# Patient Record
Sex: Male | Born: 1940
Health system: Southern US, Community
[De-identification: ages and names within clinical notes are randomized; demographics above are authoritative.]

## PROBLEM LIST (undated history)

## (undated) DIAGNOSIS — I471 Supraventricular tachycardia, unspecified: Secondary | ICD-10-CM

## (undated) DIAGNOSIS — E119 Type 2 diabetes mellitus without complications: Secondary | ICD-10-CM

## (undated) DIAGNOSIS — J449 Chronic obstructive pulmonary disease, unspecified: Secondary | ICD-10-CM

## (undated) DIAGNOSIS — C679 Malignant neoplasm of bladder, unspecified: Secondary | ICD-10-CM

## (undated) DIAGNOSIS — N289 Disorder of kidney and ureter, unspecified: Secondary | ICD-10-CM

## (undated) DIAGNOSIS — I5030 Unspecified diastolic (congestive) heart failure: Secondary | ICD-10-CM

## (undated) DIAGNOSIS — I4729 Other ventricular tachycardia: Secondary | ICD-10-CM

## (undated) DIAGNOSIS — I509 Heart failure, unspecified: Secondary | ICD-10-CM

## (undated) DIAGNOSIS — E785 Hyperlipidemia, unspecified: Secondary | ICD-10-CM

## (undated) DIAGNOSIS — I10 Essential (primary) hypertension: Secondary | ICD-10-CM

## (undated) DIAGNOSIS — N184 Chronic kidney disease, stage 4 (severe): Secondary | ICD-10-CM

## (undated) DIAGNOSIS — I472 Ventricular tachycardia: Secondary | ICD-10-CM

## (undated) DIAGNOSIS — I2510 Atherosclerotic heart disease of native coronary artery without angina pectoris: Secondary | ICD-10-CM

## (undated) HISTORY — PX: REVISION UROSTOMY CUTANEOUS: SUR1282

## (undated) HISTORY — DX: Ventricular tachycardia: I47.2

## (undated) HISTORY — DX: Supraventricular tachycardia: I47.1

## (undated) HISTORY — DX: Other ventricular tachycardia: I47.29

## (undated) HISTORY — DX: Supraventricular tachycardia, unspecified: I47.10

## (undated) HISTORY — DX: Chronic kidney disease, stage 4 (severe): N18.4

## (undated) HISTORY — DX: Unspecified diastolic (congestive) heart failure: I50.30

## (undated) HISTORY — DX: Atherosclerotic heart disease of native coronary artery without angina pectoris: I25.10

## (undated) HISTORY — PX: COLONOSCOPY: SHX174

---

## 2007-07-22 ENCOUNTER — Ambulatory Visit: Payer: Self-pay | Admitting: Family Medicine

## 2007-09-09 ENCOUNTER — Ambulatory Visit: Payer: Self-pay | Admitting: Urology

## 2007-09-09 ENCOUNTER — Other Ambulatory Visit: Payer: Self-pay

## 2007-09-15 ENCOUNTER — Ambulatory Visit: Payer: Self-pay | Admitting: Urology

## 2007-10-05 ENCOUNTER — Ambulatory Visit: Payer: Self-pay | Admitting: Internal Medicine

## 2007-10-17 ENCOUNTER — Ambulatory Visit: Payer: Self-pay | Admitting: Internal Medicine

## 2007-11-05 ENCOUNTER — Ambulatory Visit: Payer: Self-pay | Admitting: Internal Medicine

## 2007-12-05 ENCOUNTER — Ambulatory Visit: Payer: Self-pay | Admitting: Internal Medicine

## 2008-01-05 ENCOUNTER — Ambulatory Visit: Payer: Self-pay | Admitting: Internal Medicine

## 2008-02-04 ENCOUNTER — Ambulatory Visit: Payer: Self-pay | Admitting: Internal Medicine

## 2008-02-12 ENCOUNTER — Emergency Department: Payer: Self-pay | Admitting: Emergency Medicine

## 2008-03-06 ENCOUNTER — Ambulatory Visit: Payer: Self-pay | Admitting: Internal Medicine

## 2008-04-06 ENCOUNTER — Ambulatory Visit: Payer: Self-pay | Admitting: Internal Medicine

## 2008-04-26 ENCOUNTER — Ambulatory Visit: Payer: Self-pay | Admitting: Internal Medicine

## 2008-04-28 ENCOUNTER — Ambulatory Visit: Payer: Self-pay | Admitting: Internal Medicine

## 2008-05-06 ENCOUNTER — Ambulatory Visit: Payer: Self-pay | Admitting: Internal Medicine

## 2008-07-06 ENCOUNTER — Ambulatory Visit: Payer: Self-pay | Admitting: Internal Medicine

## 2008-07-21 ENCOUNTER — Ambulatory Visit: Payer: Self-pay | Admitting: Internal Medicine

## 2008-08-06 ENCOUNTER — Ambulatory Visit: Payer: Self-pay | Admitting: Internal Medicine

## 2008-09-06 ENCOUNTER — Ambulatory Visit: Payer: Self-pay | Admitting: Internal Medicine

## 2008-11-04 ENCOUNTER — Ambulatory Visit: Payer: Self-pay | Admitting: Internal Medicine

## 2008-11-08 ENCOUNTER — Ambulatory Visit: Payer: Self-pay | Admitting: Internal Medicine

## 2008-12-04 ENCOUNTER — Ambulatory Visit: Payer: Self-pay | Admitting: Internal Medicine

## 2009-04-06 ENCOUNTER — Ambulatory Visit: Payer: Self-pay | Admitting: Internal Medicine

## 2009-04-18 ENCOUNTER — Ambulatory Visit: Payer: Self-pay | Admitting: Internal Medicine

## 2009-04-27 ENCOUNTER — Ambulatory Visit: Payer: Self-pay | Admitting: Internal Medicine

## 2009-04-27 ENCOUNTER — Ambulatory Visit: Payer: Self-pay | Admitting: Ophthalmology

## 2009-05-06 ENCOUNTER — Ambulatory Visit: Payer: Self-pay | Admitting: Internal Medicine

## 2009-05-10 ENCOUNTER — Ambulatory Visit: Payer: Self-pay | Admitting: Ophthalmology

## 2009-07-26 ENCOUNTER — Ambulatory Visit: Payer: Self-pay | Admitting: Ophthalmology

## 2010-05-15 ENCOUNTER — Ambulatory Visit: Payer: Self-pay | Admitting: Family Medicine

## 2012-03-25 ENCOUNTER — Ambulatory Visit: Payer: Self-pay | Admitting: Urology

## 2012-04-09 ENCOUNTER — Ambulatory Visit: Payer: Self-pay | Admitting: Urology

## 2012-04-09 LAB — HEMOGLOBIN: HGB: 14.4 g/dL (ref 13.0–18.0)

## 2012-04-14 ENCOUNTER — Ambulatory Visit: Payer: Self-pay | Admitting: Urology

## 2012-04-21 ENCOUNTER — Ambulatory Visit: Payer: Self-pay | Admitting: Internal Medicine

## 2012-04-21 LAB — COMPREHENSIVE METABOLIC PANEL
Albumin: 3.4 g/dL (ref 3.4–5.0)
Anion Gap: 6 — ABNORMAL LOW (ref 7–16)
BUN: 15 mg/dL (ref 7–18)
Calcium, Total: 9.3 mg/dL (ref 8.5–10.1)
Chloride: 105 mmol/L (ref 98–107)
Co2: 28 mmol/L (ref 21–32)
EGFR (African American): >60
EGFR (Non-African Amer.): 57 — ABNORMAL LOW
Potassium: 4.8 mmol/L (ref 3.5–5.1)
SGOT(AST): 14 U/L — ABNORMAL LOW (ref 15–37)
Sodium: 139 mmol/L (ref 136–145)

## 2012-04-21 LAB — CBC CANCER CENTER
Basophil %: 1.3 %
Eosinophil #: 0.1 x10 3/mm (ref 0.0–0.7)
Eosinophil %: 1.4 %
HCT: 45.4 % (ref 40.0–52.0)
MCH: 28.7 pg (ref 26.0–34.0)
MCHC: 32.7 g/dL (ref 32.0–36.0)
Monocyte #: 0.7 x10 3/mm (ref 0.2–1.0)
Neutrophil #: 7.2 x10 3/mm — ABNORMAL HIGH (ref 1.4–6.5)
Neutrophil %: 78.2 %

## 2012-04-21 LAB — PROTIME-INR
INR: 1
Prothrombin Time: 13.1 secs (ref 11.5–14.7)

## 2012-04-21 LAB — APTT: Activated PTT: 27.2 secs (ref 23.6–35.9)

## 2012-04-28 ENCOUNTER — Ambulatory Visit: Payer: Self-pay | Admitting: Internal Medicine

## 2012-05-06 ENCOUNTER — Ambulatory Visit: Payer: Self-pay | Admitting: Internal Medicine

## 2013-01-12 DIAGNOSIS — K439 Ventral hernia without obstruction or gangrene: Secondary | ICD-10-CM | POA: Insufficient documentation

## 2013-01-12 DIAGNOSIS — R197 Diarrhea, unspecified: Secondary | ICD-10-CM | POA: Insufficient documentation

## 2014-11-23 NOTE — Op Note (Signed)
PATIENT NAME:  Bryan Brewer, Bryan Brewer MR#:  [id number] DATE OF BIRTH:  1941-06-13  DATE OF PROCEDURE:  04/14/2012  PREOPERATIVE DIAGNOSIS: Bladder cancer.   POSTOPERATIVE DIAGNOSES:  1. Bladder cancer.  2. Radiation cystitis.  PROCEDURE: Cystoscopy with bladder biopsy.   SURGEON: Otelia Limes. Yves Dill, MD  ANESTHETIST: [employer] and Yves Dill.   ANESTHETIC METHOD: General per [employer] [facility] and local per Yves Dill.   INDICATIONS: See the dictated history and physical. After informed consent patient requests above procedure.   OPERATIVE SUMMARY: After adequate general anesthesia had been obtained, patient was placed into dorsal lithotomy position and the perineum was prepped and draped in usual fashion. The 21 French cystoscope was coupled with the camera and then visually advanced into the bladder. Patient had a mid pendulous urethral stricture approximately 20 French in caliber and the scope was easily passed through this. Bladder was heavily trabeculated with cellules and small diverticula present. There were some erythematous patches near the bladder neck consistent with radiation cystitis. There was also some old calcifications present on the bladder neck. No obvious tumors were identified. Both ureteral orifices were identified and had clear efflux. At this point, the cold cup biopsy forceps were advanced through the scope and the erythematous areas on the bladder neck were biopsied. Biopsy sites were fulgurated with the Bugbee electrode. At this point bladder was drained and scope was removed. 10 mL of viscous Xylocaine was instilled within the urethra. 20 French silicone catheter was placed. Catheter was irrigated until clear.     Procedure was then terminated and the patient was transferred to the recovery room in stable condition.   ____________________________ Otelia Limes. Yves Dill, MD mrw:cms D: 04/14/2012 10:19:31 ET T: 04/14/2012 11:56:14 ET JOB#: [id number]  cc: Otelia Limes. Yves Dill, MD,  <Dictator> Armstead Peaks, MD  Royston Cowper MD ELECTRONICALLY SIGNED 04/15/2012 8:21

## 2014-11-23 NOTE — H&P (Signed)
PATIENT NAME:  Bryan Brewer, Bryan Brewer MR#:  [id number] DATE OF BIRTH:  03-Dec-1940  DATE OF ADMISSION:  04/14/2012  CHIEF COMPLAINT: Bladder cancer.   HISTORY OF PRESENT ILLNESS: Mr. Turnquist is a 74 year old white male with a history of muscle invasive bladder cancer treated with radiation and chemotherapy in 2009. He had follow-up cystoscopy with urine cytology August 28 which revealed suspicious cancer cells present. He also had a CT scan which revealed thickening of his bladder. He comes in now for cystoscopy with bladder biopsy.   ALLERGIES: No drug allergies.   CURRENT MEDICATIONS:  1. Atenolol. 2. Zocor. 3. Aspirin.  PAST SURGICAL HISTORY:  1. Right inguinal herniorrhaphy in 1988.  2. Appendectomy in 1954.   SOCIAL HISTORY: Smokes a pack a day, has a 30 pack-year history. Denied alcohol use.   FAMILY HISTORY: Remarkable for diabetes and heart disease.   PAST AND CURRENT MEDICAL CONDITIONS:  1. Hypertension.  2. Hypercholesterolemia.  3. Chronic obstructive pulmonary disease.   REVIEW OF SYSTEMS: Patient denied chest pain, diabetes, or stroke.   PHYSICAL EXAMINATION:  GENERAL: Well-nourished white male in no acute distress.   HEENT: Sclerae were clear. Pupils are equally round, reactive to light and accommodation. Extraocular movements are intact.   NECK: Supple. No palpable cervical adenopathy.   LUNGS: Clear to auscultation.   CARDIOVASCULAR: Regular rhythm and rate without audible murmurs or gallops.   ABDOMEN: Soft, nontender abdomen.   GENITOURINARY: Uncircumcised. Testes smooth, nontender.   RECTAL: 30 grams smooth nontender prostate.   NEUROMUSCULAR: Nonfocal. Patient alert and oriented x3.   IMPRESSION: Bladder cancer.   PLAN: Cystoscopy with bladder biopsy.   ____________________________ Otelia Limes. Yves Dill, MD mrw:cms D: 04/09/2012 11:10:17 ET T: 04/09/2012 11:26:53 ET JOB#: [id number]  cc: Otelia Limes. Yves Dill, MD, <Dictator> Royston Cowper MD ELECTRONICALLY  SIGNED 04/09/2012 13:12

## 2015-03-30 DIAGNOSIS — I10 Essential (primary) hypertension: Secondary | ICD-10-CM | POA: Insufficient documentation

## 2015-09-08 DIAGNOSIS — J410 Simple chronic bronchitis: Secondary | ICD-10-CM | POA: Insufficient documentation

## 2015-09-08 DIAGNOSIS — E7800 Pure hypercholesterolemia, unspecified: Secondary | ICD-10-CM | POA: Insufficient documentation

## 2016-04-30 DIAGNOSIS — Z8551 Personal history of malignant neoplasm of bladder: Secondary | ICD-10-CM | POA: Insufficient documentation

## 2016-08-08 ENCOUNTER — Other Ambulatory Visit: Payer: Self-pay | Admitting: Internal Medicine

## 2016-08-08 DIAGNOSIS — Z9189 Other specified personal risk factors, not elsewhere classified: Secondary | ICD-10-CM

## 2016-08-08 DIAGNOSIS — I2699 Other pulmonary embolism without acute cor pulmonale: Secondary | ICD-10-CM

## 2016-08-09 ENCOUNTER — Ambulatory Visit
Admission: RE | Admit: 2016-08-09 | Discharge: 2016-08-09 | Disposition: A | Discharge status: Home / Self Care | Payer: BLUE CROSS/BLUE SHIELD | Source: Ambulatory Visit | Attending: Internal Medicine | Admitting: Internal Medicine

## 2016-08-09 ENCOUNTER — Encounter
Admission: RE | Admit: 2016-08-09 | Discharge: 2016-08-09 | Disposition: A | Discharge status: Home / Self Care | Payer: BLUE CROSS/BLUE SHIELD | Source: Ambulatory Visit | Attending: Internal Medicine | Admitting: Internal Medicine

## 2016-08-09 DIAGNOSIS — Z9189 Other specified personal risk factors, not elsewhere classified: Secondary | ICD-10-CM

## 2016-08-09 DIAGNOSIS — R918 Other nonspecific abnormal finding of lung field: Secondary | ICD-10-CM | POA: Insufficient documentation

## 2016-08-09 DIAGNOSIS — I700 Atherosclerosis of aorta: Secondary | ICD-10-CM | POA: Diagnosis not present

## 2016-08-09 MED ORDER — TECHNETIUM TO 99M ALBUMIN AGGREGATED
4.0000 | Freq: Once | INTRAVENOUS | Status: AC | PRN
  Administered 2016-08-09: 4.42 via INTRAVENOUS

## 2016-08-09 MED ORDER — TECHNETIUM TC 99M DIETHYLENETRIAME-PENTAACETIC ACID
30.0000 | Freq: Once | INTRAVENOUS | Status: AC | PRN
  Administered 2016-08-09: 33.55 via INTRAVENOUS

## 2016-08-15 ENCOUNTER — Ambulatory Visit: Payer: Self-pay

## 2016-08-17 ENCOUNTER — Other Ambulatory Visit: Payer: Self-pay | Admitting: Orthopedic Surgery

## 2016-08-17 DIAGNOSIS — M4316 Spondylolisthesis, lumbar region: Secondary | ICD-10-CM

## 2016-08-17 DIAGNOSIS — M5136 Other intervertebral disc degeneration, lumbar region: Secondary | ICD-10-CM

## 2016-08-17 DIAGNOSIS — S22080A Wedge compression fracture of T11-T12 vertebra, initial encounter for closed fracture: Secondary | ICD-10-CM

## 2016-08-17 DIAGNOSIS — M4727 Other spondylosis with radiculopathy, lumbosacral region: Secondary | ICD-10-CM

## 2016-08-25 ENCOUNTER — Ambulatory Visit
Admission: RE | Admit: 2016-08-25 | Discharge: 2016-08-25 | Disposition: A | Discharge status: Home / Self Care | Payer: BLUE CROSS/BLUE SHIELD | Source: Ambulatory Visit | Attending: Orthopedic Surgery | Admitting: Orthopedic Surgery

## 2016-08-25 DIAGNOSIS — M5136 Other intervertebral disc degeneration, lumbar region: Secondary | ICD-10-CM | POA: Diagnosis present

## 2016-08-25 DIAGNOSIS — S22080A Wedge compression fracture of T11-T12 vertebra, initial encounter for closed fracture: Secondary | ICD-10-CM | POA: Diagnosis present

## 2016-08-25 DIAGNOSIS — M4316 Spondylolisthesis, lumbar region: Secondary | ICD-10-CM | POA: Insufficient documentation

## 2016-08-25 DIAGNOSIS — M4712 Other spondylosis with myelopathy, cervical region: Secondary | ICD-10-CM | POA: Diagnosis not present

## 2016-08-25 DIAGNOSIS — M4854XA Collapsed vertebra, not elsewhere classified, thoracic region, initial encounter for fracture: Secondary | ICD-10-CM | POA: Diagnosis not present

## 2016-08-25 DIAGNOSIS — M48061 Spinal stenosis, lumbar region without neurogenic claudication: Secondary | ICD-10-CM | POA: Insufficient documentation

## 2016-08-25 DIAGNOSIS — M4727 Other spondylosis with radiculopathy, lumbosacral region: Secondary | ICD-10-CM

## 2017-08-06 HISTORY — PX: CORONARY ARTERY BYPASS GRAFT: SHX141

## 2017-08-09 DIAGNOSIS — M5416 Radiculopathy, lumbar region: Secondary | ICD-10-CM | POA: Insufficient documentation

## 2017-08-12 ENCOUNTER — Other Ambulatory Visit: Payer: Self-pay

## 2017-08-12 ENCOUNTER — Emergency Department: Payer: BLUE CROSS/BLUE SHIELD

## 2017-08-12 ENCOUNTER — Encounter: Payer: Self-pay | Admitting: Emergency Medicine

## 2017-08-12 ENCOUNTER — Emergency Department
Admission: EM | Admit: 2017-08-12 | Discharge: 2017-08-12 | Disposition: A | Discharge status: Home / Self Care | Payer: BLUE CROSS/BLUE SHIELD | Attending: Emergency Medicine | Admitting: Emergency Medicine

## 2017-08-12 DIAGNOSIS — J449 Chronic obstructive pulmonary disease, unspecified: Secondary | ICD-10-CM | POA: Insufficient documentation

## 2017-08-12 DIAGNOSIS — Z87891 Personal history of nicotine dependence: Secondary | ICD-10-CM | POA: Diagnosis not present

## 2017-08-12 DIAGNOSIS — R0789 Other chest pain: Secondary | ICD-10-CM | POA: Insufficient documentation

## 2017-08-12 DIAGNOSIS — R079 Chest pain, unspecified: Secondary | ICD-10-CM | POA: Diagnosis present

## 2017-08-12 HISTORY — DX: Malignant neoplasm of bladder, unspecified: C67.9

## 2017-08-12 HISTORY — DX: Chronic obstructive pulmonary disease, unspecified: J44.9

## 2017-08-12 LAB — COMPREHENSIVE METABOLIC PANEL
ALK PHOS: 62 U/L (ref 38–126)
ALT: 8 U/L — AB (ref 17–63)
ANION GAP: 9 (ref 5–15)
AST: 16 U/L (ref 15–41)
Albumin: 3.6 g/dL (ref 3.5–5.0)
BUN: 62 mg/dL — ABNORMAL HIGH (ref 6–20)
CALCIUM: 8.8 mg/dL — AB (ref 8.9–10.3)
CO2: 20 mmol/L — AB (ref 22–32)
CREATININE: 3.42 mg/dL — AB (ref 0.61–1.24)
Chloride: 105 mmol/L (ref 101–111)
GFR, EST AFRICAN AMERICAN: 19 mL/min — AB (ref >60)
GFR, EST NON AFRICAN AMERICAN: 16 mL/min — AB (ref >60)
Glucose, Bld: 146 mg/dL — ABNORMAL HIGH (ref 65–99)
Potassium: 3.8 mmol/L (ref 3.5–5.1)
SODIUM: 134 mmol/L — AB (ref 135–145)
TOTAL PROTEIN: 7.6 g/dL (ref 6.5–8.1)
Total Bilirubin: 0.4 mg/dL (ref 0.3–1.2)

## 2017-08-12 LAB — LIPASE, BLOOD: Lipase: 22 U/L (ref 11–51)

## 2017-08-12 LAB — CBC
HEMATOCRIT: 29.1 % — AB (ref 40.0–52.0)
HEMOGLOBIN: 9 g/dL — AB (ref 13.0–18.0)
MCH: 22.8 pg — AB (ref 26.0–34.0)
MCHC: 31 g/dL — AB (ref 32.0–36.0)
MCV: 73.4 fL — ABNORMAL LOW (ref 80.0–100.0)
Platelets: 413 10*3/uL (ref 150–440)
RBC: 3.96 MIL/uL — ABNORMAL LOW (ref 4.40–5.90)
RDW: 17.3 % — AB (ref 11.5–14.5)
WBC: 13.2 10*3/uL — ABNORMAL HIGH (ref 3.8–10.6)

## 2017-08-12 LAB — TROPONIN I
Troponin I: 0.03 ng/mL (ref <0.03)
Troponin I: 0.05 ng/mL (ref <0.03)

## 2017-08-12 MED ORDER — FAMOTIDINE 20 MG PO TABS: 20.0000 mg | ORAL_TABLET | Freq: Two times a day (BID) | ORAL | 0 refills | Status: DC

## 2017-08-12 NOTE — ED Triage Notes (Signed)
R chest pain since 11am, has had similar episodes x 2 years intermittent.

## 2017-08-12 NOTE — ED Provider Notes (Signed)
[facility] Emergency Department Provider Note ____________________________________________   First MD Initiated Contact with Patient 08/12/17 1400     (approximate)  I have reviewed the triage vital signs and the nursing notes.   HISTORY  Chief Complaint Chest Pain    HPI Bryan Brewer is a 77 y.o. male with past medical history as noted below who presents with chest discomfort, intermittent over the last several months, described as a funny feeling primarily in the right chest, lasting for approximately 15 minutes at a time and often resolved when the patient belches.  The patient states that he has these episodes up to a few times per week and they have not worsened recently, but he states he came into the emergency department today because his wife told him to.  The patient reports chronic shortness of breath and cough related to his COPD but no acute worsening in this, and no swelling in his legs, fever, or weakness.   Past Medical History:  Diagnosis Date  . Bladder cancer ([place])   . COPD (chronic obstructive pulmonary disease) ([facility])     There are no active problems to display for this patient.   Past Surgical History:  Procedure Laterality Date  . REVISION UROSTOMY CUTANEOUS      Prior to Admission medications   Not on File    Allergies Patient has no known allergies.  No family history on file.  Social History Social History   Tobacco Use  . Smoking status: Former Smoker  Substance Use Topics  . Alcohol use: Not on file  . Drug use: Not on file    Review of Systems  Constitutional: No fever. Eyes: No redness. ENT: No sore throat. Cardiovascular: Positive for intermittent chest discomfort. Respiratory: Positive for chronic shortness of breath. Gastrointestinal: No nausea, no vomiting.  Genitourinary: Negative for dysuria or flank pain.  Musculoskeletal: Negative for back pain. Skin: Negative for rash. Neurological:  Negative for headache.   ____________________________________________   PHYSICAL EXAM:  VITAL SIGNS: ED Triage Vitals  Enc Vitals Group     BP 08/12/17 1259 (!) 183/83     Pulse Rate 08/12/17 1259 83     Resp 08/12/17 1259 20     Temp 08/12/17 1259 98.1 F (36.7 C)     Temp Source 08/12/17 1259 Oral     SpO2 08/12/17 1259 99 %     Weight 08/12/17 1301 170 lb (77.1 kg)     Height 08/12/17 1301 5\' 7"  (1.702 m)     Head Circumference --      Peak Flow --      Pain Score 08/12/17 1259 0     Pain Loc --      Pain Edu? --      Excl. in [place]? --     Constitutional: Alert and oriented. Well appearing and in no acute distress. Eyes: Conjunctivae are normal.  Head: Atraumatic. Nose: No congestion/rhinnorhea. Mouth/Throat: Mucous membranes are moist.   Neck: Normal range of motion.  Cardiovascular: Normal rate, regular rhythm. Grossly normal heart sounds.  Good peripheral circulation. Respiratory: Normal respiratory effort.  No retractions.  Slightly decreased breath sounds bilaterally but lungs otherwise CTAB. Gastrointestinal: Soft and nontender. No distention.  Genitourinary: No CVA tenderness. Musculoskeletal: No lower extremity edema.  Extremities warm and well perfused.  Neurologic:  Normal speech and language. No gross focal neurologic deficits are appreciated.  Skin:  Skin is warm and dry. No rash noted. Psychiatric: Mood and affect are  normal. Speech and behavior are normal.  ____________________________________________   LABS (all labs ordered are listed, but only abnormal results are displayed)  Labs Reviewed  CBC - Abnormal; Notable for the following components:      Result Value   WBC 13.2 (*)    RBC 3.96 (*)    Hemoglobin 9.0 (*)    HCT 29.1 (*)    MCV 73.4 (*)    MCH 22.8 (*)    MCHC 31.0 (*)    RDW 17.3 (*)    All other components within normal limits  TROPONIN I - Abnormal; Notable for the following components:   Troponin I 0.03 (*)    All other  components within normal limits  COMPREHENSIVE METABOLIC PANEL - Abnormal; Notable for the following components:   Sodium 134 (*)    CO2 20 (*)    Glucose, Bld 146 (*)    BUN 62 (*)    Creatinine, Ser 3.42 (*)    Calcium 8.8 (*)    ALT 8 (*)    GFR calc non Af Amer 16 (*)    GFR calc Af Amer 19 (*)    All other components within normal limits  TROPONIN I - Abnormal; Notable for the following components:   Troponin I 0.05 (*)    All other components within normal limits  LIPASE, BLOOD   ____________________________________________  EKG  ED ECG REPORT I, Arta Silence, the attending physician, personally viewed and interpreted this ECG.  Date: 08/12/2017 EKG Time: 1258 Rate: 84 Rhythm: Sinus rhythm with PVCs QRS Axis: normal Intervals: normal ST/T Wave abnormalities: LVH with repolarization abnormality Narrative Interpretation: no evidence of acute ischemia; no significant change when compared to EKG of 04/09/2012  ____________________________________________  RADIOLOGY  CXR: Peribronchial cuffing consistent with chronic bronchitis  ____________________________________________   PROCEDURES  Procedure(s) performed: No    Critical Care performed: No ____________________________________________   INITIAL IMPRESSION / ASSESSMENT AND PLAN / ED COURSE  Pertinent labs & imaging results that were available during my care of the patient were reviewed by me and considered in my medical decision making (see chart for details).  77 year old male with history of COPD and other PMH as noted above presents with atypical chest discomfort that has actually been going on for the last several months and last for several minutes at a time up to a few times per week.  It is resolved with belching.  I reviewed the past medical records in epic and they are noncontributory.  On exam, the patient is relatively well-appearing for age, the vital signs are normal, and the remainder  the exam is unremarkable.  His EKG shows no acute ischemic changes.  Given the resolution of symptoms with belching and the time course the presentation is most consistent with GERD or gastritis, however given patient's age and comorbidities he has a somewhat increased risk for ACS.  Also consider muscular skeletal pain or less likely CHF or other pulmonary cause.  No clinical evidence to support PE or vascular cause.  Plan: Chest x-ray, cardiac enzymes x2, basic labs, and reassess.  If negative workup anticipate discharge home with antacid medication.  ----------------------------------------- 5:08 PM on 08/12/2017 -----------------------------------------  Patient's chest x-ray shows findings consistent with chronic bronchitis/COPD.  His lab workup is consistent with his baseline; the patient has chronic anemia and renal insufficiency, but there is no significant change when compared to his labs found in [place] from June of last year.  Patient's white blood cell count is  slightly elevated which is nonspecific and his clinical presentation is not consistent with any acute infection.  Patient's troponins are minimally elevated in the low indeterminate range.  There is no significant increase from the first of the second, and the presence of the troponin is likely related to patient's chronic kidney disease.  There is no clinical evidence for ACS or ischemic heart disease.  The patient continues to feel well and is asymptomatic.  He would like to go home.  His vital signs are stable, and at this time there is no indication for further ED workup or observation.  As mentioned above, I suspect that the symptoms may be related to GERD versus muscular skeletal pain.  I will start the patient on a regimen of famotidine.  I counseled the patient and his wife on the results of the workup and return precautions, and they expressed understanding and agreement with the  plan.  ____________________________________________   FINAL CLINICAL IMPRESSION(S) / ED DIAGNOSES  Final diagnoses:  Atypical chest pain      NEW MEDICATIONS STARTED DURING THIS VISIT:  This SmartLink is deprecated. Use AVSMEDLIST instead to display the medication list for a patient.   Note:  This document was prepared using Dragon voice recognition software and may include unintentional dictation errors.    Arta Silence, MD 08/12/17 [phone]

## 2017-08-12 NOTE — Discharge Instructions (Signed)
Follow-up with your primary care doctor and with your cardiologist as scheduled over the next several weeks.  Your chest discomfort and the increased gas and belching may be caused by excess stomach acid or acid going into the esophagus, often referred to as reflux.  You should take the medication prescribed over the next few weeks as it may improve your symptoms.  Return to the emergency room immediately for new or worsening chest pain, difficulty breathing, weakness or lightheadedness, fevers, vomiting, abdominal pain, or any other new or worsening symptoms that concern you.

## 2017-08-14 ENCOUNTER — Other Ambulatory Visit: Payer: Self-pay | Admitting: Orthopedic Surgery

## 2017-08-14 DIAGNOSIS — M5416 Radiculopathy, lumbar region: Secondary | ICD-10-CM

## 2017-08-16 ENCOUNTER — Ambulatory Visit
Admission: RE | Admit: 2017-08-16 | Discharge: 2017-08-16 | Disposition: A | Discharge status: Home / Self Care | Payer: BLUE CROSS/BLUE SHIELD | Source: Ambulatory Visit | Attending: Orthopedic Surgery | Admitting: Orthopedic Surgery

## 2017-08-16 DIAGNOSIS — M48061 Spinal stenosis, lumbar region without neurogenic claudication: Secondary | ICD-10-CM | POA: Insufficient documentation

## 2017-08-16 DIAGNOSIS — M47896 Other spondylosis, lumbar region: Secondary | ICD-10-CM | POA: Diagnosis not present

## 2017-08-16 DIAGNOSIS — M5416 Radiculopathy, lumbar region: Secondary | ICD-10-CM

## 2017-08-16 DIAGNOSIS — N1330 Unspecified hydronephrosis: Secondary | ICD-10-CM | POA: Insufficient documentation

## 2017-08-20 DIAGNOSIS — M48061 Spinal stenosis, lumbar region without neurogenic claudication: Secondary | ICD-10-CM | POA: Insufficient documentation

## 2017-10-09 ENCOUNTER — Encounter: Payer: Self-pay | Admitting: Emergency Medicine

## 2017-10-09 ENCOUNTER — Emergency Department: Payer: BLUE CROSS/BLUE SHIELD

## 2017-10-09 ENCOUNTER — Other Ambulatory Visit: Payer: Self-pay

## 2017-10-09 ENCOUNTER — Emergency Department
Admission: EM | Admit: 2017-10-09 | Discharge: 2017-10-09 | Disposition: A | Discharge status: Home / Self Care | Payer: BLUE CROSS/BLUE SHIELD | Attending: Emergency Medicine | Admitting: Emergency Medicine

## 2017-10-09 DIAGNOSIS — K219 Gastro-esophageal reflux disease without esophagitis: Secondary | ICD-10-CM | POA: Diagnosis not present

## 2017-10-09 DIAGNOSIS — Z8551 Personal history of malignant neoplasm of bladder: Secondary | ICD-10-CM | POA: Diagnosis not present

## 2017-10-09 DIAGNOSIS — Z87891 Personal history of nicotine dependence: Secondary | ICD-10-CM | POA: Diagnosis not present

## 2017-10-09 DIAGNOSIS — J449 Chronic obstructive pulmonary disease, unspecified: Secondary | ICD-10-CM | POA: Insufficient documentation

## 2017-10-09 DIAGNOSIS — R0789 Other chest pain: Secondary | ICD-10-CM

## 2017-10-09 DIAGNOSIS — R079 Chest pain, unspecified: Secondary | ICD-10-CM | POA: Diagnosis present

## 2017-10-09 HISTORY — DX: Disorder of kidney and ureter, unspecified: N28.9

## 2017-10-09 LAB — BASIC METABOLIC PANEL
ANION GAP: 9 (ref 5–15)
BUN: 42 mg/dL — ABNORMAL HIGH (ref 6–20)
CHLORIDE: 109 mmol/L (ref 101–111)
CO2: 19 mmol/L — ABNORMAL LOW (ref 22–32)
Calcium: 8.4 mg/dL — ABNORMAL LOW (ref 8.9–10.3)
Creatinine, Ser: 2.88 mg/dL — ABNORMAL HIGH (ref 0.61–1.24)
GFR, EST AFRICAN AMERICAN: 23 mL/min — AB (ref >60)
GFR, EST NON AFRICAN AMERICAN: 20 mL/min — AB (ref >60)
Glucose, Bld: 243 mg/dL — ABNORMAL HIGH (ref 65–99)
POTASSIUM: 4.2 mmol/L (ref 3.5–5.1)
SODIUM: 137 mmol/L (ref 135–145)

## 2017-10-09 LAB — CBC
HEMATOCRIT: 29.1 % — AB (ref 40.0–52.0)
HEMOGLOBIN: 8.9 g/dL — AB (ref 13.0–18.0)
MCH: 21.9 pg — ABNORMAL LOW (ref 26.0–34.0)
MCHC: 30.6 g/dL — ABNORMAL LOW (ref 32.0–36.0)
MCV: 71.5 fL — ABNORMAL LOW (ref 80.0–100.0)
Platelets: 338 10*3/uL (ref 150–440)
RBC: 4.07 MIL/uL — AB (ref 4.40–5.90)
RDW: 19.1 % — AB (ref 11.5–14.5)
WBC: 11.1 10*3/uL — AB (ref 3.8–10.6)

## 2017-10-09 LAB — TROPONIN I
TROPONIN I: 0.03 ng/mL — AB (ref <0.03)
Troponin I: 0.04 ng/mL (ref <0.03)

## 2017-10-09 MED ORDER — ALUM & MAG HYDROXIDE-SIMETH 200-200-20 MG/5ML PO SUSP
15.0000 mL | Freq: Once | ORAL | Status: AC
  Administered 2017-10-09: 15 mL via ORAL
  Filled 2017-10-09: qty 30

## 2017-10-09 MED ORDER — FAMOTIDINE 20 MG PO TABS
40.0000 mg | ORAL_TABLET | Freq: Once | ORAL | Status: AC
  Administered 2017-10-09: 40 mg via ORAL
  Filled 2017-10-09: qty 2

## 2017-10-09 MED ORDER — FAMOTIDINE 20 MG PO TABS: 20.0000 mg | ORAL_TABLET | Freq: Two times a day (BID) | ORAL | 0 refills | Status: DC

## 2017-10-09 NOTE — ED Triage Notes (Signed)
Pt here from [facility] with c/o cp and palpitations that happened last night, pt not in active cp at this time. Appears in no distress, states cp was so bad last night he started throwing up.  Wife concerned he might have anxiety as well.

## 2017-10-09 NOTE — ED Provider Notes (Signed)
[facility] Emergency Department Provider Note  ____________________________________________  Time seen: Approximately 2:46 PM  I have reviewed the triage vital signs and the nursing notes.   HISTORY  Chief Complaint Chest Pain and Palpitations    HPI Bryan Brewer is a 77 y.o. male who complains of an episode of chest pain last night. this is a recurrent pain is been worked up in the past without any significant findings. He saw cardiology last year for similar pain. He has an appointment with cardiology again in about 2 weeks.  He describes it as central tightness, nonradiating. Better with walking around. Worse with food. Better with belching or vomiting.today the pain was resolved after vomiting once. Lasted a total of 10 minutes. No syncope shortness of breath radiation or diaphoresis. Not exertional, not pleuritic. He rates it as moderate intensity. Pain was constant until it resolved.  no exertional symptoms     Past Medical History:  Diagnosis Date  . Bladder cancer ([place])   . COPD (chronic obstructive pulmonary disease) ([place])   . Kidney disease      There are no active problems to display for this patient.    Past Surgical History:  Procedure Laterality Date  . REVISION UROSTOMY CUTANEOUS       Prior to Admission medications   Medication Sig Start Date End Date Taking? Authorizing Provider  famotidine (PEPCID) 20 MG tablet Take 1 tablet (20 mg total) by mouth 2 (two) times daily. 08/12/17 09/11/17  Arta Silence, MD     Allergies Patient has no known allergies.   No family history on file.  Social History Social History   Tobacco Use  . Smoking status: Former Research scientist (life sciences)  . Smokeless tobacco: Never Used  Substance Use Topics  . Alcohol use: No    Frequency: Never  . Drug use: Not on file    Review of Systems  Constitutional:   No fever or chills.  ENT:   No sore throat. No rhinorrhea. Cardiovascular:  positive as above  chest tightness without syncope. Respiratory:   No dyspnea or cough. Gastrointestinal:   Negative for abdominal pain, positive vomiting 1.  Musculoskeletal:   Negative for focal pain or swelling All other systems reviewed and are negative except as documented above in ROS and HPI.  ____________________________________________   PHYSICAL EXAM:  VITAL SIGNS: ED Triage Vitals [10/09/17 1156]  Enc Vitals Group     BP 128/71     Pulse Rate 98     Resp 18     Temp 98.2 F (36.8 C)     Temp Source Oral     SpO2 98 %     Weight 167 lb (75.8 kg)     Height 5\' 7"  (1.702 m)     Head Circumference      Peak Flow      Pain Score      Pain Loc      Pain Edu?      Excl. in [place]?     Vital signs reviewed, nursing assessments reviewed.   Constitutional:   Alert and oriented. Well appearing and in no distress. Eyes:   No scleral icterus.  EOMI. No nystagmus. No conjunctival pallor. PERRL. ENT   Head:   Normocephalic and atraumatic.   Nose:   No congestion/rhinnorhea.    Mouth/Throat:   MMM, no pharyngeal erythema. No peritonsillar mass.    Neck:   No meningismus. Full ROM. Hematological/Lymphatic/Immunilogical:   No cervical lymphadenopathy. Cardiovascular:  RRR. Symmetric bilateral radial and DP pulses.  No murmurs.  Respiratory:   Normal respiratory effort without tachypnea/retractions. Breath sounds are clear and equal bilaterally. No wheezes/rales/rhonchi. Gastrointestinal:   Soft and nontender. Non distended. There is no CVA tenderness.  No rebound, rigidity, or guarding. Genitourinary:   deferred Musculoskeletal:   Normal range of motion in all extremities. No joint effusions.  No lower extremity tenderness.  No edema. Neurologic:   Normal speech and language.  Motor grossly intact. No acute focal neurologic deficits are appreciated.  Skin:    Skin is warm, dry and intact. No rash noted.  No petechiae, purpura, or  bullae.  ____________________________________________    LABS (pertinent positives/negatives) (all labs ordered are listed, but only abnormal results are displayed) Labs Reviewed  BASIC METABOLIC PANEL - Abnormal; Notable for the following components:      Result Value   CO2 19 (*)    Glucose, Bld 243 (*)    BUN 42 (*)    Creatinine, Ser 2.88 (*)    Calcium 8.4 (*)    GFR calc non Af Amer 20 (*)    GFR calc Af Amer 23 (*)    All other components within normal limits  CBC - Abnormal; Notable for the following components:   WBC 11.1 (*)    RBC 4.07 (*)    Hemoglobin 8.9 (*)    HCT 29.1 (*)    MCV 71.5 (*)    MCH 21.9 (*)    MCHC 30.6 (*)    RDW 19.1 (*)    All other components within normal limits  TROPONIN I - Abnormal; Notable for the following components:   Troponin I 0.04 (*)    All other components within normal limits  TROPONIN I   ____________________________________________   EKG  interpreted by me Normal sinus rhythm rate of 96, normal axis and intervals. Normal QRS ST segments and T waves. Voltage criteria for LVH in the high lateral leads with some associated repolarization abnormality.no change compared to 08/12/2017.  ____________________________________________    RADIOLOGY  Dg Chest 2 View  Result Date: 10/09/2017 CLINICAL DATA:  Chest pain. EXAM: CHEST - 2 VIEW COMPARISON:  08/12/2017 FINDINGS: Heart size and pulmonary vascularity are normal. Chronic accentuation of the interstitial markings with chronic peribronchial thickening. No acute infiltrates or effusions. No acute bone abnormality. Old compression deformity at the thoracolumbar junction. IMPRESSION: No acute abnormality.  Chronic bronchitic changes. Electronically Signed   By: Lorriane Shire M.D.   On: 10/09/2017 12:52    ____________________________________________   PROCEDURES Procedures  ____________________________________________    CLINICAL IMPRESSION / ASSESSMENT AND PLAN / ED  COURSE  Pertinent labs & imaging results that were available during my care of the patient were reviewed by me and considered in my medical decision making (see chart for details).   patient presents with atypical chest pain which has been evaluated previously and not significantly changed from his chronic pattern.Considering the patient's symptoms, medical history, and physical examination today, I have low suspicion for ACS, PE, TAD, pneumothorax, carditis, mediastinitis, pneumonia, CHF, or sepsis.  I suspect that the symptoms are related to acid reflux. He does not currently take any antacids. I'll give him Maalox and Pepcid. Check a second troponin, plan to discharge with a prescription for H2 blocker twice a day until he follows up with his doctor and cardiologist.  Clinical Course as of Oct 09 1444  Wed Oct 09, 2017  1410 Labs at chronic baseline. Ckd. Chronic anemia.  Chronic slight elevation of troponin. Cxr unremarkable, without acute pneumonia or ptx.  [PS]    Clinical Course User Index [PS] [name], MD     ----------------------------------------- 2:50 PM on 10/09/2017 -----------------------------------------  Second troponin pending. Care of the patient be signed out to oncoming physician Dr. Alfred Levins to follow-up prior to disposition.  ____________________________________________   FINAL CLINICAL IMPRESSION(S) / ED DIAGNOSES    Final diagnoses:  Atypical chest pain  Gastroesophageal reflux disease, esophagitis presence not specified     ED Discharge Orders    None      Portions of this note were generated with dragon dictation software. Dictation errors may occur despite best attempts at proofreading.    [name], MD 10/09/17 1450

## 2017-10-29 ENCOUNTER — Emergency Department: Payer: BLUE CROSS/BLUE SHIELD

## 2017-10-29 ENCOUNTER — Inpatient Hospital Stay
Admission: EM | Admit: 2017-10-29 | Discharge: 2017-11-01 | DRG: 281 | Payer: BLUE CROSS/BLUE SHIELD | Attending: Internal Medicine | Admitting: Internal Medicine

## 2017-10-29 ENCOUNTER — Encounter: Payer: Self-pay | Admitting: Emergency Medicine

## 2017-10-29 ENCOUNTER — Encounter
Admission: EM | Disposition: A | Discharge status: Other Acute Inpatient Hospital | Payer: Self-pay | Source: Home / Self Care | Attending: Internal Medicine

## 2017-10-29 DIAGNOSIS — I214 Non-ST elevation (NSTEMI) myocardial infarction: Secondary | ICD-10-CM | POA: Diagnosis not present

## 2017-10-29 DIAGNOSIS — J449 Chronic obstructive pulmonary disease, unspecified: Secondary | ICD-10-CM | POA: Diagnosis present

## 2017-10-29 DIAGNOSIS — D631 Anemia in chronic kidney disease: Secondary | ICD-10-CM | POA: Diagnosis present

## 2017-10-29 DIAGNOSIS — E119 Type 2 diabetes mellitus without complications: Secondary | ICD-10-CM

## 2017-10-29 DIAGNOSIS — J439 Emphysema, unspecified: Secondary | ICD-10-CM | POA: Diagnosis present

## 2017-10-29 DIAGNOSIS — X58XXXA Exposure to other specified factors, initial encounter: Secondary | ICD-10-CM | POA: Diagnosis present

## 2017-10-29 DIAGNOSIS — Z79899 Other long term (current) drug therapy: Secondary | ICD-10-CM

## 2017-10-29 DIAGNOSIS — Z9221 Personal history of antineoplastic chemotherapy: Secondary | ICD-10-CM

## 2017-10-29 DIAGNOSIS — I25110 Atherosclerotic heart disease of native coronary artery with unstable angina pectoris: Secondary | ICD-10-CM | POA: Diagnosis present

## 2017-10-29 DIAGNOSIS — I429 Cardiomyopathy, unspecified: Secondary | ICD-10-CM | POA: Diagnosis present

## 2017-10-29 DIAGNOSIS — I509 Heart failure, unspecified: Secondary | ICD-10-CM | POA: Diagnosis present

## 2017-10-29 DIAGNOSIS — N179 Acute kidney failure, unspecified: Secondary | ICD-10-CM | POA: Diagnosis present

## 2017-10-29 DIAGNOSIS — R402413 Glasgow coma scale score 13-15, at hospital admission: Secondary | ICD-10-CM | POA: Diagnosis present

## 2017-10-29 DIAGNOSIS — N2581 Secondary hyperparathyroidism of renal origin: Secondary | ICD-10-CM | POA: Diagnosis present

## 2017-10-29 DIAGNOSIS — Z8551 Personal history of malignant neoplasm of bladder: Secondary | ICD-10-CM

## 2017-10-29 DIAGNOSIS — I97630 Postprocedural hematoma of a circulatory system organ or structure following a cardiac catheterization: Secondary | ICD-10-CM | POA: Diagnosis not present

## 2017-10-29 DIAGNOSIS — Z87891 Personal history of nicotine dependence: Secondary | ICD-10-CM

## 2017-10-29 DIAGNOSIS — E7800 Pure hypercholesterolemia, unspecified: Secondary | ICD-10-CM | POA: Diagnosis present

## 2017-10-29 DIAGNOSIS — I130 Hypertensive heart and chronic kidney disease with heart failure and stage 1 through stage 4 chronic kidney disease, or unspecified chronic kidney disease: Principal | ICD-10-CM | POA: Diagnosis present

## 2017-10-29 DIAGNOSIS — I10 Essential (primary) hypertension: Secondary | ICD-10-CM | POA: Diagnosis present

## 2017-10-29 DIAGNOSIS — Y848 Other medical procedures as the cause of abnormal reaction of the patient, or of later complication, without mention of misadventure at the time of the procedure: Secondary | ICD-10-CM | POA: Diagnosis not present

## 2017-10-29 DIAGNOSIS — Z923 Personal history of irradiation: Secondary | ICD-10-CM

## 2017-10-29 DIAGNOSIS — E785 Hyperlipidemia, unspecified: Secondary | ICD-10-CM | POA: Diagnosis present

## 2017-10-29 DIAGNOSIS — N184 Chronic kidney disease, stage 4 (severe): Secondary | ICD-10-CM | POA: Diagnosis present

## 2017-10-29 DIAGNOSIS — E875 Hyperkalemia: Secondary | ICD-10-CM | POA: Diagnosis not present

## 2017-10-29 DIAGNOSIS — Z7982 Long term (current) use of aspirin: Secondary | ICD-10-CM

## 2017-10-29 DIAGNOSIS — Z7984 Long term (current) use of oral hypoglycemic drugs: Secondary | ICD-10-CM

## 2017-10-29 DIAGNOSIS — E1122 Type 2 diabetes mellitus with diabetic chronic kidney disease: Secondary | ICD-10-CM | POA: Diagnosis present

## 2017-10-29 HISTORY — DX: Hyperlipidemia, unspecified: E78.5

## 2017-10-29 HISTORY — DX: Essential (primary) hypertension: I10

## 2017-10-29 HISTORY — DX: Type 2 diabetes mellitus without complications: E11.9

## 2017-10-29 LAB — CBC
HCT: 28.4 % — ABNORMAL LOW (ref 40.0–52.0)
Hemoglobin: 8.9 g/dL — ABNORMAL LOW (ref 13.0–18.0)
MCH: 22 pg — AB (ref 26.0–34.0)
MCHC: 31.5 g/dL — AB (ref 32.0–36.0)
MCV: 69.7 fL — ABNORMAL LOW (ref 80.0–100.0)
Platelets: 530 10*3/uL — ABNORMAL HIGH (ref 150–440)
RBC: 4.07 MIL/uL — AB (ref 4.40–5.90)
RDW: 19 % — ABNORMAL HIGH (ref 11.5–14.5)
WBC: 11.6 10*3/uL — ABNORMAL HIGH (ref 3.8–10.6)

## 2017-10-29 SURGERY — CORONARY/GRAFT ACUTE MI REVASCULARIZATION
Anesthesia: Moderate Sedation

## 2017-10-29 MED ORDER — ASPIRIN 81 MG PO CHEW
324.0000 mg | CHEWABLE_TABLET | Freq: Once | ORAL | Status: AC
  Administered 2017-10-29: 324 mg via ORAL

## 2017-10-29 NOTE — ED Notes (Signed)
Family with pt. Sinus tach on monitor.  Pt alert.  Iv's in place.  Pt waiting on to go to cath lab.

## 2017-10-29 NOTE — ED Notes (Signed)
ED Provider at bedside. 

## 2017-10-29 NOTE — ED Notes (Signed)
2nd sl ntg given   Pain 3 at this time.

## 2017-10-29 NOTE — ED Provider Notes (Addendum)
[facility] Emergency Department Provider Note  ____________________________________________   First MD Initiated Contact with Patient 10/29/17 2329     (approximate)  I have reviewed the triage vital signs and the nursing notes.   HISTORY  Chief Complaint Chest Pain   HPI Bryan Brewer is a 77 y.o. male who self presents to the emergency department with roughly 1 hour of substernal pressure-like chest pain associated with shortness of breath.  The pain is been moderate severity substernal pressure-like nonradiating.  Seems to be somewhat worse with exertion and somewhat improved with rest.  He has never had a heart attack before.  He takes 81 mg of baby aspirin a day.  He does have a history of high cholesterol.    Past Medical History:  Diagnosis Date  . Bladder cancer ([place])   . COPD (chronic obstructive pulmonary disease) ([place])   . Kidney disease     There are no active problems to display for this patient.   Past Surgical History:  Procedure Laterality Date  . REVISION UROSTOMY CUTANEOUS      Prior to Admission medications   Medication Sig Start Date End Date Taking? Authorizing Provider  famotidine (PEPCID) 20 MG tablet Take 1 tablet (20 mg total) by mouth 2 (two) times daily. 10/09/17 11/08/17 Yes [name], MD  glimepiride (AMARYL) 1 MG tablet Take 1 mg by mouth daily. 09/17/17  Yes [provider]  metoprolol tartrate (LOPRESSOR) 25 MG tablet Take 25 mg by mouth 2 (two) times daily. 10/16/17  Yes [provider]  simvastatin (ZOCOR) 20 MG tablet Take 20 mg by mouth at bedtime. 09/16/17  Yes [provider]  sodium bicarbonate 650 MG tablet Take 1,300 mg by mouth 2 (two) times daily. 09/29/17  Yes [provider]    Allergies Patient has no known allergies.  History reviewed. No pertinent family history.  Social History Social History   Tobacco Use  . Smoking status: Former Research scientist (life sciences)  . Smokeless  tobacco: Never Used  Substance Use Topics  . Alcohol use: No    Frequency: Never  . Drug use: Not on file    Review of Systems Constitutional: No fever/chills Eyes: No visual changes. ENT: No sore throat. Cardiovascular: Positive for chest pain. Respiratory: Positive for shortness of breath. Gastrointestinal: No abdominal pain.  No nausea, no vomiting.  No diarrhea.  No constipation. Genitourinary: Negative for dysuria. Musculoskeletal: Negative for back pain. Skin: Negative for rash. Neurological: Negative for headaches, focal weakness or numbness.   ____________________________________________   PHYSICAL EXAM:  VITAL SIGNS: ED Triage Vitals  Enc Vitals Group     BP 10/29/17 2324 (!) 159/98     Pulse Rate 10/29/17 2324 (!) 115     Resp 10/29/17 2324 20     Temp 10/29/17 2324 97.8 F (36.6 C)     Temp Source 10/29/17 2324 Oral     SpO2 10/29/17 2324 96 %     Weight 10/29/17 2318 167 lb (75.8 kg)     Height --      Head Circumference --      Peak Flow --      Pain Score --      Pain Loc --      Pain Edu? --      Excl. in [place]? --     Constitutional: Alert and oriented x4 appears somewhat uncomfortable and tearful Eyes: PERRL EOMI. Head: Atraumatic. Nose: No congestion/rhinnorhea. Mouth/Throat: No trismus Neck: No stridor.  Cardiovascular: Tachycardic rate, regular rhythm. Grossly normal heart sounds.  Good peripheral circulation. Respiratory: Slightly increased respiratory effort.  No retractions. Lungs CTAB and moving good air Gastrointestinal: Soft nontender Musculoskeletal: No lower extremity edema legs are equal in size Neurologic:  Normal speech and language. No gross focal neurologic deficits are appreciated. Skin:  Skin is warm, dry and intact. No rash noted. Psychiatric: Somewhat anxious appearing    ____________________________________________   DIFFERENTIAL includes but not limited to  STEMI, triple-vessel disease, dehydration,  anemia ____________________________________________   LABS (all labs ordered are listed, but only abnormal results are displayed)  Labs Reviewed  BASIC METABOLIC PANEL - Abnormal; Notable for the following components:      Result Value   Sodium 132 (*)    CO2 15 (*)    Glucose, Bld 247 (*)    BUN 53 (*)    Creatinine, Ser 3.15 (*)    Calcium 8.7 (*)    GFR calc non Af Amer 18 (*)    GFR calc Af Amer 21 (*)    All other components within normal limits  CBC - Abnormal; Notable for the following components:   WBC 11.6 (*)    RBC 4.07 (*)    Hemoglobin 8.9 (*)    HCT 28.4 (*)    MCV 69.7 (*)    MCH 22.0 (*)    MCHC 31.5 (*)    RDW 19.0 (*)    Platelets 530 (*)    All other components within normal limits  TROPONIN I - Abnormal; Notable for the following components:   Troponin I 0.05 (*)    All other components within normal limits  APTT  PROTIME-INR  HEPARIN LEVEL (UNFRACTIONATED)  CBC    Lab work reviewed by me shows hemoglobin of 8.9 which is at the patient's baseline Elevated troponin nonspecific and could be secondary to acute myocardial ischemia versus chronic kidney disease __________________________________________  EKG  ED ECG REPORT I, Darel Hong, the attending physician, personally viewed and interpreted this ECG.  Date: 10/29/2017 EKG Time: 2323 Rate: 112 Rhythm: Sinus tachycardia QRS Axis: normal Intervals: normal ST/T Wave abnormalities: Concerning ST elevation in aVR with diffuse ST depression concerning for ST elevation myocardial infarction Narrative Interpretation: Concerning for proximal LAD versus left mainstem he  ____________________________________________  RADIOLOGY  Chest x-ray reviewed by me with no acute disease ____________________________________________   PROCEDURES  Procedure(s) performed: no  .Critical Care Performed by: Darel Hong, MD Authorized by: Darel Hong, MD   Critical care provider statement:     Critical care time (minutes):  45   Critical care time was exclusive of:  Separately billable procedures and treating other patients   Critical care was necessary to treat or prevent imminent or life-threatening deterioration of the following conditions:  Cardiac failure   Critical care was time spent personally by me on the following activities:  Development of treatment plan with patient or surrogate, discussions with consultants, evaluation of patient's response to treatment, examination of patient, obtaining history from patient or surrogate, ordering and performing treatments and interventions, ordering and review of laboratory studies, ordering and review of radiographic studies, pulse oximetry, re-evaluation of patient's condition and review of old charts    Critical Care performed: Yes  Observation: no ____________________________________________   INITIAL IMPRESSION / ASSESSMENT AND PLAN / ED COURSE  Pertinent labs & imaging results that were available during my care of the patient were reviewed by me and considered in my medical decision making (see chart for details).  On arrival the patient is uncomfortable appearing with a concerning story for angina.  His EKG shows aVR ST elevation with diffuse ST depression which could be secondary to left main disease versus proximal LAD STEMI versus triple-vessel disease versus another process which would cause diffuse global myocardial ischemia.  I have no other obvious source and at this point I will contact the on-call interventional cardiologist and activate the Cath Lab.  324 mg of aspirin now on Plavix deferred.    ----------------------------------------- 11:37 PM on 10/29/2017 -----------------------------------------  I spoke with on-call interventional cardiologist Dr. and who will kindly come evaluate the patient and take him to the Cath  Lab.  ____________________________________________ ----------------------------------------- 11:55 PM on 10/29/2017 -----------------------------------------  After 2 tablets of nitroglycerin the patient's pain is resolved.   ----------------------------------------- 12:28 AM on 10/30/2017 -----------------------------------------  Dr. and evaluated the patient and feels that on serial EKGs there are no dynamic changes and as the patient's pain is resolved he would treat the patient as an N STEMI with IV heparin and ICU admission and defer catheterization to the morning.  I discussed with the patient and his family verbalized understanding and agreement with inpatient admission.  I then discussed with the hospitalist Dr. Jannifer Franklin who has graciously agreed to admit the patient to his service.  Prior to transfer to the ICU I discussed the case with on-call cardiologist Dr. Nehemiah Massed who will kindly consult on the patient.  FINAL CLINICAL IMPRESSION(S) / ED DIAGNOSES  Final diagnoses:  NSTEMI (non-ST elevated myocardial infarction) ([place])      NEW MEDICATIONS STARTED DURING THIS VISIT:  New Prescriptions   No medications on file     Note:  This document was prepared using Dragon voice recognition software and may include unintentional dictation errors.     Darel Hong, MD 10/30/17 6720    Darel Hong, MD 10/30/17 [phone]

## 2017-10-29 NOTE — ED Notes (Signed)
[place] in with pt and family.

## 2017-10-29 NOTE — ED Notes (Signed)
Pt to room 4 with chest pain .  Pt reports pain in center of chest.  Pt had 1 episode of vomiting.    No sob. Pt alert.

## 2017-10-29 NOTE — ED Triage Notes (Signed)
Pt reports having central chest pain that started approximate 1 hour ago. Pt reports N/V since. Pt has heart monitor on currently due to palpitation episodes on March 6. Pt denies SOB at this time.

## 2017-10-30 ENCOUNTER — Encounter: Payer: Self-pay | Admitting: Internal Medicine

## 2017-10-30 ENCOUNTER — Inpatient Hospital Stay
Admit: 2017-10-30 | Discharge: 2017-10-30 | Disposition: A | Discharge status: Home / Self Care | Payer: BLUE CROSS/BLUE SHIELD | Attending: Internal Medicine | Admitting: Internal Medicine

## 2017-10-30 DIAGNOSIS — D509 Iron deficiency anemia, unspecified: Secondary | ICD-10-CM | POA: Diagnosis not present

## 2017-10-30 DIAGNOSIS — E875 Hyperkalemia: Secondary | ICD-10-CM | POA: Diagnosis not present

## 2017-10-30 DIAGNOSIS — E7800 Pure hypercholesterolemia, unspecified: Secondary | ICD-10-CM | POA: Diagnosis present

## 2017-10-30 DIAGNOSIS — I429 Cardiomyopathy, unspecified: Secondary | ICD-10-CM | POA: Diagnosis present

## 2017-10-30 DIAGNOSIS — I200 Unstable angina: Secondary | ICD-10-CM

## 2017-10-30 DIAGNOSIS — I10 Essential (primary) hypertension: Secondary | ICD-10-CM | POA: Diagnosis not present

## 2017-10-30 DIAGNOSIS — Z7984 Long term (current) use of oral hypoglycemic drugs: Secondary | ICD-10-CM | POA: Diagnosis not present

## 2017-10-30 DIAGNOSIS — I130 Hypertensive heart and chronic kidney disease with heart failure and stage 1 through stage 4 chronic kidney disease, or unspecified chronic kidney disease: Secondary | ICD-10-CM | POA: Diagnosis present

## 2017-10-30 DIAGNOSIS — X58XXXA Exposure to other specified factors, initial encounter: Secondary | ICD-10-CM | POA: Diagnosis present

## 2017-10-30 DIAGNOSIS — R402413 Glasgow coma scale score 13-15, at hospital admission: Secondary | ICD-10-CM | POA: Diagnosis present

## 2017-10-30 DIAGNOSIS — J449 Chronic obstructive pulmonary disease, unspecified: Secondary | ICD-10-CM | POA: Diagnosis present

## 2017-10-30 DIAGNOSIS — E1122 Type 2 diabetes mellitus with diabetic chronic kidney disease: Secondary | ICD-10-CM

## 2017-10-30 DIAGNOSIS — N2581 Secondary hyperparathyroidism of renal origin: Secondary | ICD-10-CM | POA: Diagnosis present

## 2017-10-30 DIAGNOSIS — Z923 Personal history of irradiation: Secondary | ICD-10-CM | POA: Diagnosis not present

## 2017-10-30 DIAGNOSIS — N179 Acute kidney failure, unspecified: Secondary | ICD-10-CM | POA: Diagnosis not present

## 2017-10-30 DIAGNOSIS — D631 Anemia in chronic kidney disease: Secondary | ICD-10-CM | POA: Diagnosis present

## 2017-10-30 DIAGNOSIS — N184 Chronic kidney disease, stage 4 (severe): Secondary | ICD-10-CM | POA: Diagnosis present

## 2017-10-30 DIAGNOSIS — D649 Anemia, unspecified: Secondary | ICD-10-CM | POA: Diagnosis not present

## 2017-10-30 DIAGNOSIS — Z8551 Personal history of malignant neoplasm of bladder: Secondary | ICD-10-CM | POA: Diagnosis not present

## 2017-10-30 DIAGNOSIS — E785 Hyperlipidemia, unspecified: Secondary | ICD-10-CM | POA: Diagnosis present

## 2017-10-30 DIAGNOSIS — I509 Heart failure, unspecified: Secondary | ICD-10-CM | POA: Diagnosis present

## 2017-10-30 DIAGNOSIS — Z7982 Long term (current) use of aspirin: Secondary | ICD-10-CM | POA: Diagnosis not present

## 2017-10-30 DIAGNOSIS — E119 Type 2 diabetes mellitus without complications: Secondary | ICD-10-CM

## 2017-10-30 DIAGNOSIS — N183 Chronic kidney disease, stage 3 (moderate): Secondary | ICD-10-CM | POA: Diagnosis not present

## 2017-10-30 DIAGNOSIS — J439 Emphysema, unspecified: Secondary | ICD-10-CM | POA: Diagnosis present

## 2017-10-30 DIAGNOSIS — Z9221 Personal history of antineoplastic chemotherapy: Secondary | ICD-10-CM | POA: Diagnosis not present

## 2017-10-30 DIAGNOSIS — I97630 Postprocedural hematoma of a circulatory system organ or structure following a cardiac catheterization: Secondary | ICD-10-CM | POA: Diagnosis not present

## 2017-10-30 DIAGNOSIS — I214 Non-ST elevation (NSTEMI) myocardial infarction: Secondary | ICD-10-CM | POA: Diagnosis present

## 2017-10-30 DIAGNOSIS — Y848 Other medical procedures as the cause of abnormal reaction of the patient, or of later complication, without mention of misadventure at the time of the procedure: Secondary | ICD-10-CM | POA: Diagnosis not present

## 2017-10-30 DIAGNOSIS — I25110 Atherosclerotic heart disease of native coronary artery with unstable angina pectoris: Secondary | ICD-10-CM | POA: Diagnosis present

## 2017-10-30 DIAGNOSIS — Z87891 Personal history of nicotine dependence: Secondary | ICD-10-CM | POA: Diagnosis not present

## 2017-10-30 DIAGNOSIS — Z79899 Other long term (current) drug therapy: Secondary | ICD-10-CM | POA: Diagnosis not present

## 2017-10-30 LAB — CBC
HCT: 24.1 % — ABNORMAL LOW (ref 40.0–52.0)
HCT: 27.6 % — ABNORMAL LOW (ref 40.0–52.0)
Hemoglobin: 7.6 g/dL — ABNORMAL LOW (ref 13.0–18.0)
Hemoglobin: 9.2 g/dL — ABNORMAL LOW (ref 13.0–18.0)
MCH: 21.7 pg — ABNORMAL LOW (ref 26.0–34.0)
MCH: 23.5 pg — ABNORMAL LOW (ref 26.0–34.0)
MCHC: 31.4 g/dL — ABNORMAL LOW (ref 32.0–36.0)
MCHC: 33.2 g/dL (ref 32.0–36.0)
MCV: 69.1 fL — ABNORMAL LOW (ref 80.0–100.0)
MCV: 70.8 fL — ABNORMAL LOW (ref 80.0–100.0)
PLATELETS: 416 10*3/uL (ref 150–440)
PLATELETS: 417 10*3/uL (ref 150–440)
RBC: 3.48 MIL/uL — AB (ref 4.40–5.90)
RBC: 3.9 MIL/uL — AB (ref 4.40–5.90)
RDW: 19.1 % — AB (ref 11.5–14.5)
RDW: 19.6 % — AB (ref 11.5–14.5)
WBC: 9.7 10*3/uL (ref 3.8–10.6)
WBC: 7 10*3/uL (ref 3.8–10.6)

## 2017-10-30 LAB — TROPONIN I
TROPONIN I: 0.05 ng/mL — AB (ref <0.03)
TROPONIN I: 2.34 ng/mL — AB (ref <0.03)
Troponin I: 0.67 ng/mL (ref <0.03)
Troponin I: 1.82 ng/mL (ref <0.03)

## 2017-10-30 LAB — BASIC METABOLIC PANEL
ANION GAP: 13 (ref 5–15)
Anion gap: 10 (ref 5–15)
BUN: 53 mg/dL — ABNORMAL HIGH (ref 6–20)
BUN: 56 mg/dL — AB (ref 6–20)
CHLORIDE: 104 mmol/L (ref 101–111)
CO2: 15 mmol/L — AB (ref 22–32)
CO2: 18 mmol/L — ABNORMAL LOW (ref 22–32)
Calcium: 8.7 mg/dL — ABNORMAL LOW (ref 8.9–10.3)
Calcium: 8.4 mg/dL — ABNORMAL LOW (ref 8.9–10.3)
Chloride: 106 mmol/L (ref 101–111)
Creatinine, Ser: 3.15 mg/dL — ABNORMAL HIGH (ref 0.61–1.24)
Creatinine, Ser: 3.02 mg/dL — ABNORMAL HIGH (ref 0.61–1.24)
GFR calc Af Amer: 22 mL/min — ABNORMAL LOW (ref >60)
GFR calc non Af Amer: 18 mL/min — ABNORMAL LOW (ref >60)
GFR, EST AFRICAN AMERICAN: 21 mL/min — AB (ref >60)
GFR, EST NON AFRICAN AMERICAN: 19 mL/min — AB (ref >60)
GLUCOSE: 247 mg/dL — AB (ref 65–99)
GLUCOSE: 142 mg/dL — AB (ref 65–99)
POTASSIUM: 4.6 mmol/L (ref 3.5–5.1)
Potassium: 4.4 mmol/L (ref 3.5–5.1)
Sodium: 132 mmol/L — ABNORMAL LOW (ref 135–145)
Sodium: 134 mmol/L — ABNORMAL LOW (ref 135–145)

## 2017-10-30 LAB — GLUCOSE, CAPILLARY
GLUCOSE-CAPILLARY: 103 mg/dL — AB (ref 65–99)
GLUCOSE-CAPILLARY: 80 mg/dL (ref 65–99)
GLUCOSE-CAPILLARY: 90 mg/dL (ref 65–99)
GLUCOSE-CAPILLARY: 136 mg/dL — AB (ref 65–99)
GLUCOSE-CAPILLARY: 145 mg/dL — AB (ref 65–99)
Glucose-Capillary: 158 mg/dL — ABNORMAL HIGH (ref 65–99)
Glucose-Capillary: 121 mg/dL — ABNORMAL HIGH (ref 65–99)

## 2017-10-30 LAB — PROTIME-INR
INR: 1.22
PROTHROMBIN TIME: 15.3 s — AB (ref 11.4–15.2)

## 2017-10-30 LAB — PREPARE RBC (CROSSMATCH)

## 2017-10-30 LAB — HEPARIN LEVEL (UNFRACTIONATED)
HEPARIN UNFRACTIONATED: 0.38 [IU]/mL (ref 0.30–0.70)
Heparin Unfractionated: 0.1 IU/mL — ABNORMAL LOW (ref 0.30–0.70)

## 2017-10-30 LAB — MRSA PCR SCREENING: MRSA BY PCR: POSITIVE — AB

## 2017-10-30 LAB — ABO/RH: ABO/RH(D): A POS

## 2017-10-30 LAB — APTT: APTT: 70 s — AB (ref 24–36)

## 2017-10-30 MED ORDER — SODIUM CHLORIDE 0.9 % IV SOLN: Freq: Once | INTRAVENOUS | Status: DC

## 2017-10-30 MED ORDER — NITROGLYCERIN 5 MG/ML IV SOLN
INTRAVENOUS | Status: AC
  Filled 2017-10-30: qty 10

## 2017-10-30 MED ORDER — HEPARIN BOLUS VIA INFUSION
4000.0000 [IU] | Freq: Once | INTRAVENOUS | Status: AC
  Administered 2017-10-30: 4000 [IU] via INTRAVENOUS
  Filled 2017-10-30: qty 4000

## 2017-10-30 MED ORDER — FAMOTIDINE 20 MG PO TABS
20.0000 mg | ORAL_TABLET | Freq: Every day | ORAL | Status: DC
  Administered 2017-10-30 – 2017-11-01 (×3): 20 mg via ORAL
  Filled 2017-10-30 (×3): qty 1

## 2017-10-30 MED ORDER — VERAPAMIL HCL 2.5 MG/ML IV SOLN
INTRAVENOUS | Status: AC
  Filled 2017-10-30: qty 2

## 2017-10-30 MED ORDER — INSULIN ASPART 100 UNIT/ML ~~LOC~~ SOLN
0.0000 [IU] | SUBCUTANEOUS | Status: DC
  Administered 2017-10-30 – 2017-10-31 (×5): 1 [IU] via SUBCUTANEOUS
  Filled 2017-10-30 (×5): qty 1

## 2017-10-30 MED ORDER — SIMVASTATIN 20 MG PO TABS
20.0000 mg | ORAL_TABLET | Freq: Every day | ORAL | Status: DC
  Administered 2017-10-30 – 2017-10-31 (×2): 20 mg via ORAL
  Filled 2017-10-30 (×2): qty 1

## 2017-10-30 MED ORDER — ONDANSETRON HCL 4 MG/2ML IJ SOLN: 4.0000 mg | Freq: Four times a day (QID) | INTRAMUSCULAR | Status: DC | PRN

## 2017-10-30 MED ORDER — ACETAMINOPHEN 650 MG RE SUPP: 650.0000 mg | Freq: Four times a day (QID) | RECTAL | Status: DC | PRN

## 2017-10-30 MED ORDER — SODIUM BICARBONATE 650 MG PO TABS
1300.0000 mg | ORAL_TABLET | Freq: Two times a day (BID) | ORAL | Status: DC
  Administered 2017-10-30 – 2017-11-01 (×6): 1300 mg via ORAL
  Filled 2017-10-30 (×7): qty 2

## 2017-10-30 MED ORDER — SODIUM CHLORIDE 0.9 % IV SOLN
INTRAVENOUS | Status: DC
  Administered 2017-10-30 – 2017-11-01 (×2): via INTRAVENOUS

## 2017-10-30 MED ORDER — ACETAMINOPHEN 325 MG PO TABS: 650.0000 mg | ORAL_TABLET | Freq: Four times a day (QID) | ORAL | Status: DC | PRN

## 2017-10-30 MED ORDER — HEPARIN (PORCINE) IN NACL 2-0.9 UNIT/ML-% IJ SOLN
INTRAMUSCULAR | Status: AC
  Filled 2017-10-30: qty 500

## 2017-10-30 MED ORDER — HEPARIN (PORCINE) IN NACL 100-0.45 UNIT/ML-% IJ SOLN
1150.0000 [IU]/h | INTRAMUSCULAR | Status: DC
  Administered 2017-10-30: 1150 [IU]/h via INTRAVENOUS
  Administered 2017-10-30: 900 [IU]/h via INTRAVENOUS
  Filled 2017-10-30 (×2): qty 250

## 2017-10-30 MED ORDER — METOPROLOL TARTRATE 50 MG PO TABS
50.0000 mg | ORAL_TABLET | Freq: Two times a day (BID) | ORAL | Status: DC
  Administered 2017-10-30 – 2017-11-01 (×5): 50 mg via ORAL
  Filled 2017-10-30 (×5): qty 1

## 2017-10-30 MED ORDER — HEPARIN BOLUS VIA INFUSION
2200.0000 [IU] | Freq: Once | INTRAVENOUS | Status: AC
  Administered 2017-10-30: 2200 [IU] via INTRAVENOUS
  Filled 2017-10-30: qty 2200

## 2017-10-30 MED ORDER — METOPROLOL TARTRATE 25 MG PO TABS: 25.0000 mg | ORAL_TABLET | Freq: Two times a day (BID) | ORAL | Status: DC

## 2017-10-30 MED ORDER — MUPIROCIN 2 % EX OINT
1.0000 "application " | TOPICAL_OINTMENT | Freq: Two times a day (BID) | CUTANEOUS | Status: DC
  Administered 2017-10-30 – 2017-11-01 (×6): 1 via NASAL
  Filled 2017-10-30: qty 22

## 2017-10-30 MED ORDER — ONDANSETRON HCL 4 MG PO TABS: 4.0000 mg | ORAL_TABLET | Freq: Four times a day (QID) | ORAL | Status: DC | PRN

## 2017-10-30 MED ORDER — CHLORHEXIDINE GLUCONATE CLOTH 2 % EX PADS
6.0000 | MEDICATED_PAD | Freq: Every day | CUTANEOUS | Status: DC
  Administered 2017-11-01: 6 via TOPICAL

## 2017-10-30 NOTE — ED Notes (Signed)
Dr End here now

## 2017-10-30 NOTE — Progress Notes (Signed)
ANTICOAGULATION CONSULT NOTE - Initial Consult  Pharmacy Consult for heparin Indication: chest pain/ACS  No Known Allergies  Patient Measurements: Height: 5\' 7"  (170.2 cm) Weight: 163 lb 9.3 oz (74.2 kg) IBW/kg (Calculated) : 66.1 Heparin Dosing Weight: 75.8 kg  Vital Signs: Temp: 97.7 F (36.5 C) (03/27 0700) Temp Source: Oral (03/27 0700) BP: 125/71 (03/27 1200) Pulse Rate: 66 (03/27 1200)  Labs: Recent Labs    10/29/17 2319 10/30/17 0308 10/30/17 0803 10/30/17 1015  HGB 8.9* 7.6*  --   --   HCT 28.4* 24.1*  --   --   PLT 530* 416  --   --   APTT  --  70*  --   --   LABPROT  --  15.3*  --   --   INR  --  1.22  --   --   HEPARINUNFRC  --   --   --  0.10*  CREATININE 3.15* 3.02*  --   --   TROPONINI 0.05* 0.67* 1.82*  --     Estimated Creatinine Clearance: 19.5 mL/min (A) (by C-G formula based on SCr of 3.02 mg/dL (H)).   Medical History: Past Medical History:  Diagnosis Date  . Bladder cancer ([place])   . COPD (chronic obstructive pulmonary disease) ([place])   . Hyperlipidemia   . Hypertension   . Kidney disease   . Type 2 diabetes mellitus ([facility])     Medications:  Scheduled:  . Chlorhexidine Gluconate Cloth  6 each Topical Q0600  . famotidine  20 mg Oral Daily  . heparin  2,200 Units Intravenous Once  . insulin aspart  0-9 Units Subcutaneous Q4H  . metoprolol tartrate  50 mg Oral BID  . mupirocin ointment  1 application Nasal BID  . simvastatin  20 mg Oral QHS  . sodium bicarbonate  1,300 mg Oral BID    Assessment: Patient admitted for CP was initially called a code STEMI, but then cancelled. Patient does have positive trop of 0.05. No PTA anticoagulation. Patient is being started on a heparin drip.  Goal of Therapy:  Heparin level 0.3-0.7 units/ml Monitor platelets by anticoagulation protocol: Yes   Plan:  Will bolus heparin 2200 units iv once and increase infusion to 1150 units/hr. Will check next HL in 8 hours.   Ulice Dash, PharmD Clinical  Pharmacist  10/30/2017

## 2017-10-30 NOTE — Progress Notes (Signed)
ANTICOAGULATION CONSULT NOTE - Initial Consult  Pharmacy Consult for heparin Indication: chest pain/ACS  No Known Allergies  Patient Measurements: Weight: 167 lb (75.8 kg) Heparin Dosing Weight: 75.8 kg  Vital Signs: Temp: 97.8 F (36.6 C) (03/26 2324) Temp Source: Oral (03/26 2324) BP: 131/78 (03/27 0000) Pulse Rate: 93 (03/27 0000)  Labs: Recent Labs    10/29/17 2319  HGB 8.9*  HCT 28.4*  PLT 530*  CREATININE 3.15*  TROPONINI 0.05*    Estimated Creatinine Clearance: 18.7 mL/min (A) (by C-G formula based on SCr of 3.15 mg/dL (H)).   Medical History: Past Medical History:  Diagnosis Date  . Bladder cancer ([place])   . COPD (chronic obstructive pulmonary disease) ([place])   . Kidney disease     Medications:  Scheduled:  . heparin  4,000 Units Intravenous Once    Assessment: Patient admitted for CP was initially called a code STEMI, but then cancelled. Patient does have positive trop of 0.05. No PTA anticoagulation. Patient is being started on a heparin drip.  Goal of Therapy:  Heparin level 0.3-0.7 units/ml Monitor platelets by anticoagulation protocol: Yes   Plan:  Will bolus heparin 4000 units IV x 1 Will start heparin drip @ 900 units/hr Will draw baseline labs. Will monitor daily CBC's and adjust per anti-Xa's  Tobie Lords, PharmD, BCPS Clinical Pharmacist 10/30/2017

## 2017-10-30 NOTE — Progress Notes (Signed)
ANTICOAGULATION CONSULT NOTE - Initial Consult  Pharmacy Consult for heparin Indication: chest pain/ACS  No Known Allergies  Patient Measurements: Height: 5\' 7"  (170.2 cm) Weight: 163 lb 9.3 oz (74.2 kg) IBW/kg (Calculated) : 66.1 Heparin Dosing Weight: 75.8 kg  Vital Signs: Temp: 98.1 F (36.7 C) (03/27 1800) Temp Source: Oral (03/27 1800) BP: 136/71 (03/27 1900) Pulse Rate: 74 (03/27 1900)  Labs: Recent Labs    10/29/17 2319 10/30/17 0308 10/30/17 0803 10/30/17 1015 10/30/17 1411 10/30/17 1904 10/30/17 2100  HGB 8.9* 7.6*  --   --   --  9.2*  --   HCT 28.4* 24.1*  --   --   --  27.6*  --   PLT 530* 416  --   --   --  417  --   APTT  --  70*  --   --   --   --   --   LABPROT  --  15.3*  --   --   --   --   --   INR  --  1.22  --   --   --   --   --   HEPARINUNFRC  --   --   --  0.10*  --   --  0.38  CREATININE 3.15* 3.02*  --   --   --   --   --   TROPONINI 0.05* 0.67* 1.82*  --  2.34*  --   --     Estimated Creatinine Clearance: 19.5 mL/min (A) (by C-G formula based on SCr of 3.02 mg/dL (H)).   Medical History: Past Medical History:  Diagnosis Date  . Bladder cancer ([place])   . COPD (chronic obstructive pulmonary disease) ([place])   . Hyperlipidemia   . Hypertension   . Kidney disease   . Type 2 diabetes mellitus (HCC)     Medications:  Scheduled:  . Chlorhexidine Gluconate Cloth  6 each Topical Q0600  . famotidine  20 mg Oral Daily  . insulin aspart  0-9 Units Subcutaneous Q4H  . metoprolol tartrate  50 mg Oral BID  . mupirocin ointment  1 application Nasal BID  . simvastatin  20 mg Oral QHS  . sodium bicarbonate  1,300 mg Oral BID    Assessment: Patient admitted for CP was initially called a code STEMI, but then cancelled. Patient does have positive trop of 0.05. No PTA anticoagulation. Patient is being started on a heparin drip.  Goal of Therapy:  Heparin level 0.3-0.7 units/ml Monitor platelets by anticoagulation protocol: Yes   Plan:  Will  bolus heparin 2200 units iv once and increase infusion to 1150 units/hr. Will check next HL in 8 hours.   3/27:  HL @ 21:00 = 0.38.   Will continue pt on current drip rate of 1150 units/hr and recheck HL in 8 hrs on  3/28 with AM labs.   [place] D Clinical Pharmacist  10/30/2017

## 2017-10-30 NOTE — ED Notes (Signed)
Heparin started   primedoc in with pt for admission.  Pt alert.  Sinus on monitor at 87.  No chest pain.  Family with pt.

## 2017-10-30 NOTE — Consult Note (Signed)
Name: Bryan Brewer MRN: [id number] DOB: 1940-11-11    ADMISSION DATE:  10/29/2017 CONSULTATION DATE: 10/30/2017  REFERRING MD : Dr. Jannifer Franklin   CHIEF COMPLAINT: Chest Pain   BRIEF PATIENT DESCRIPTION:  77 yo male admitted with unstable angina secondary to NSTEMI requiring heparin gtt   SIGNIFICANT EVENTS  03/26-Pt admitted to ICU   STUDIES:  None   HISTORY OF PRESENT ILLNESS:   This is a 77 yo male with a PMH of Type II DM, HTN, Hyperlipidemia, COPD, Bladder Cancer (dx 2008 s/p chemotherapy, radiation, and cystoprostatectomy with ileal conduit), and CKD Stage IV (followed by [facility] baseline creatinine 2.2-2.5 mg/dL).  He presented to [facility] ER 03/26 with 1 hour of non-radiating substernal chest pressure/pain, nausea/vomiting,  and shortness of breath. Per ER notes symptoms worse with exertion.  He has had complaints of palpitations, tachycardia, dyspnea on exertion, angina symptoms, and intermittent nausea/vomiting requiring ER visits in January 2019 and March 2019.  He is currently being followed by Dr. Clayborn Bigness in the outpatient setting his most recent office visit was 10/16/2017 recommendations were NM myocardial perfusion SPECT multiple (stress and rest), Event Loop Monitor, and Echo Complete with follow-up 11/16/2017.  Cardiac Cath deferred at that time due to CKD.  During current ER visit EKG concerning for aVR ST elevation with diffuse ST depression, therefore code STEMI initiated, pt received sublingual nitroglycerin x2 resolving chest pain. ER physician contacted on call Cardiologist Dr. Saunders Revel after reviewing EKG's pt did not meet STEMI criteria. Therefore, due to resolution of chest pain and EKG findings Dr. Saunders Revel recommended heparin gtt for now with possible cardiac catheterization on 10/30/2017. He was subsequently admitted to ICU by hospitalist team for further treatment and workup.   PAST MEDICAL HISTORY :   has a past medical history of Bladder cancer ([place]), COPD (chronic  obstructive pulmonary disease) ([place]), Hyperlipidemia, Hypertension, Kidney disease, and Type 2 diabetes mellitus ([place]).  has a past surgical history that includes Revision urostomy cutaneous. Prior to Admission medications   Medication Sig Start Date End Date Taking? Authorizing Provider  famotidine (PEPCID) 20 MG tablet Take 1 tablet (20 mg total) by mouth 2 (two) times daily. 10/09/17 11/08/17 Yes [name], MD  glimepiride (AMARYL) 1 MG tablet Take 1 mg by mouth daily. 09/17/17  Yes [provider]  metoprolol tartrate (LOPRESSOR) 25 MG tablet Take 25 mg by mouth 2 (two) times daily. 10/16/17  Yes [provider]  simvastatin (ZOCOR) 20 MG tablet Take 20 mg by mouth at bedtime. 09/16/17  Yes [provider]  sodium bicarbonate 650 MG tablet Take 1,300 mg by mouth 2 (two) times daily. 09/29/17  Yes [provider]   No Known Allergies  FAMILY HISTORY:  family history includes Breast cancer in his sister; CAD in his father and mother; Stroke in his father and mother. SOCIAL HISTORY:  reports that he quit smoking about 6 years ago. His smoking use included cigarettes. He has never used smokeless tobacco. He reports that he does not drink alcohol or use drugs.  REVIEW OF SYSTEMS: Positives in BOLD  Constitutional: Negative for fever, chills, weight loss, malaise/fatigue and diaphoresis.  HENT: Negative for hearing loss, ear pain, nosebleeds, congestion, sore throat, neck pain, tinnitus and ear discharge.   Eyes: Negative for blurred vision, double vision, photophobia, pain, discharge and redness.  Respiratory: cough, hemoptysis, sputum production, shortness of breath, wheezing and stridor.   Cardiovascular: chest pain, palpitations, orthopnea, claudication, leg swelling and PND.  Gastrointestinal: heartburn, nausea,  vomiting, abdominal pain, diarrhea, constipation, blood in stool and melena.  Genitourinary: Negative for dysuria, urgency, frequency, hematuria  and flank pain.  Musculoskeletal: Negative for myalgias, back pain, joint pain and falls.  Skin: Negative for itching and rash.  Neurological: Negative for dizziness, tingling, tremors, sensory change, speech change, focal weakness, seizures, loss of consciousness, weakness and headaches.  Endo/Heme/Allergies: Negative for environmental allergies and polydipsia. Does not bruise/bleed easily.  SUBJECTIVE:  No complaints at this time   VITAL SIGNS: Temp:  [97.8 F (36.6 C)] 97.8 F (36.6 C) (03/26 2324) Pulse Rate:  [87-115] 87 (03/27 0030) Resp:  [18-26] 18 (03/27 0030) BP: (131-159)/(77-98) 132/77 (03/27 0030) SpO2:  [96 %-100 %] 100 % (03/27 0030) Weight:  [75.8 kg (167 lb)] 75.8 kg (167 lb) (03/26 2319)  PHYSICAL EXAMINATION: General: well developed, well nourished male resting in bed, NAD  Neuro: alert and oriented, follows commands HEENT: supple, no JVD Cardiovascular: nsr, rrr, no R/G Lungs: clear throughout, even, non labored  Abdomen: LLQ urostomy present draining yellow urine, peristomal hernia soft and reducible, +BS x4, non tender, soft  Musculoskeletal: trace bilateral lower extremity edema Skin: no rashes or lesions   Recent Labs  Lab 10/29/17 2319  NA 132*  K 4.4  CL 104  CO2 15*  BUN 53*  CREATININE 3.15*  GLUCOSE 247*   Recent Labs  Lab 10/29/17 2319  HGB 8.9*  HCT 28.4*  WBC 11.6*  PLT 530*   Dg Chest 2 View  Result Date: 10/29/2017 CLINICAL DATA:  77 year old male with chest pain. EXAM: CHEST - 2 VIEW COMPARISON:  Chest radiograph dated 10/09/2017 FINDINGS: Chronic interstitial coarsening and mild bronchitic changes. There is no focal consolidation, pleural effusion, or pneumothorax. The cardiac silhouette is within normal limits. No acute osseous pathology. IMPRESSION: No active cardiopulmonary disease. Electronically Signed   By: Anner Crete M.D.   On: 10/29/2017 23:37    ASSESSMENT / PLAN: Unstable Angina secondary to NSTEMI  Acute on  chronic renal failure (baseline creatinine 2.2-2.5 mg/dL) Chronic Anemia  Type II DM  Hx: Hyperlipidemia, HTN, COPD, and Bladder Cancer P: Supplemental O2 for dyspnea and/or hypoxia  Trend troponin's Continuous telemetry monitoring Heparin gtt dosing per pharmacy  Continue cardiac medications per cardiology recommendations  Echo pending Cardiology consulted appreciate input Trend BMP  Replace electrolytes as indicated  Continue outpatient sodium bicarb Monitor UOP  Trend CBC  Monitor for s/sx of bleeding and transfuse for hgb <8 SSI   -Per on call Cardiologist if chest pain recurs and not resolved with nitroglycerin gtt will contact Cardiology for emergent cardiac catheterization   Marda Stalker, [place] Pager [phone] (please enter 7 digits) PCCM Consult Pager [phone] (please enter 7 digits)

## 2017-10-30 NOTE — Progress Notes (Signed)
[place] at [facility]                                                                                                                                                                                  Patient Demographics   Bryan Brewer, is a 77 y.o. male, DOB - 05-18-1941, DEY:[id number]  Admit date - 10/29/2017   Admitting Physician Lance Coon, MD  Outpatient Primary MD for the patient is Glendon Axe, MD   LOS - 0  Subjective: Patient seen and evaluated by me today No complaints of any chest pain  no shortness of breath  No palpitations   Review of Systems:   CONSTITUTIONAL: No documented fever. No fatigue, weakness. No weight gain, no weight loss.  EYES: No blurry or double vision.  ENT: No tinnitus. No postnasal drip. No redness of the oropharynx.  RESPIRATORY: No cough, no wheeze, no hemoptysis. No dyspnea.  CARDIOVASCULAR: No chest pain. No orthopnea. No palpitations. No syncope.  GASTROINTESTINAL: No nausea, no vomiting or diarrhea. No abdominal pain. No melena or hematochezia.  GENITOURINARY: No dysuria or hematuria.  ENDOCRINE: No polyuria or nocturia. No heat or cold intolerance.  HEMATOLOGY: No anemia. No bruising. No bleeding.  INTEGUMENTARY: No rashes. No lesions.  MUSCULOSKELETAL: No arthritis. No swelling. No gout.  NEUROLOGIC: No numbness, tingling, or ataxia. No seizure-type activity.  PSYCHIATRIC: No anxiety. No insomnia. No ADD.    Vitals:   Vitals:   10/30/17 1200 10/30/17 1300 10/30/17 1404 10/30/17 1445  BP: 125/71 130/74 130/84 128/71  Pulse: 66 70 73 73  Resp: 10 17 15  (!) 21  Temp:  97.8 F (36.6 C) 97.7 F (36.5 C) 98 F (36.7 C)  TempSrc:  Oral Oral Oral  SpO2: 99% 100% 100% 100%  Weight:      Height:        Wt Readings from Last 3 Encounters:  10/30/17 74.2 kg (163 lb 9.3 oz)  10/09/17 75.8 kg (167 lb)  08/12/17 77.1 kg (170 lb)     Intake/Output Summary (Last 24 hours) at 10/30/2017 1607 Last data  filed at 10/30/2017 1200 Gross per 24 hour  Intake 383 ml  Output 1050 ml  Net -667 ml    Physical Exam:   GENERAL: Pleasant-appearing in no apparent distress.  HEAD, EYES, EARS, NOSE AND THROAT: Atraumatic, normocephalic. Extraocular muscles are intact. Pupils equal and reactive to light. Sclerae anicteric. No conjunctival injection. No oro-pharyngeal erythema.  NECK: Supple. There is no jugular venous distention. No bruits, no lymphadenopathy, no thyromegaly.  HEART: Regular rate and rhythm,. No murmurs, no rubs, no clicks.  LUNGS: Clear to auscultation bilaterally. No rales or rhonchi. No  wheezes.  ABDOMEN: Soft, flat, nontender, nondistended. Has good bowel sounds. No hepatosplenomegaly appreciated.  EXTREMITIES: No evidence of any cyanosis, clubbing, or peripheral edema.  +2 pedal and radial pulses bilaterally.  NEUROLOGIC: The patient is alert, awake, and oriented x3 with no focal motor or sensory deficits appreciated bilaterally.  SKIN: Moist and warm with no rashes appreciated.  Psych: Not anxious, depressed LN: No inguinal LN enlargement    Antibiotics   Anti-infectives (From admission, onward)   None      Medications   Scheduled Meds: . Chlorhexidine Gluconate Cloth  6 each Topical Q0600  . famotidine  20 mg Oral Daily  . insulin aspart  0-9 Units Subcutaneous Q4H  . metoprolol tartrate  50 mg Oral BID  . mupirocin ointment  1 application Nasal BID  . simvastatin  20 mg Oral QHS  . sodium bicarbonate  1,300 mg Oral BID   Continuous Infusions: . sodium chloride 75 mL/hr at 10/30/17 0600  . sodium chloride    . heparin 1,150 Units/hr (10/30/17 1341)   PRN Meds:.acetaminophen **OR** acetaminophen, ondansetron **OR** ondansetron (ZOFRAN) IV   Data Review:   Micro Results Recent Results (from the past 240 hour(s))  MRSA PCR Screening     Status: Abnormal   Collection Time: 10/30/17  2:11 AM  Result Value Ref Range Status   MRSA by PCR POSITIVE (A) NEGATIVE  Final    Comment:        The GeneXpert MRSA Assay (FDA approved for NASAL specimens only), is one component of a comprehensive MRSA colonization surveillance program. It is not intended to diagnose MRSA infection nor to guide or monitor treatment for MRSA infections. RESULT CALLED TO, READ BACK BY AND VERIFIED WITH: BARBARA THAO AT 0335 ON 10/30/17 [place]. Performed at [facility], [address], [place], [place] [address]     Radiology Reports Dg Chest 2 View  Result Date: 10/29/2017 CLINICAL DATA:  77 year old male with chest pain. EXAM: CHEST - 2 VIEW COMPARISON:  Chest radiograph dated 10/09/2017 FINDINGS: Chronic interstitial coarsening and mild bronchitic changes. There is no focal consolidation, pleural effusion, or pneumothorax. The cardiac silhouette is within normal limits. No acute osseous pathology. IMPRESSION: No active cardiopulmonary disease. Electronically Signed   By: Anner Crete M.D.   On: 10/29/2017 23:37   Dg Chest 2 View  Result Date: 10/09/2017 CLINICAL DATA:  Chest pain. EXAM: CHEST - 2 VIEW COMPARISON:  08/12/2017 FINDINGS: Heart size and pulmonary vascularity are normal. Chronic accentuation of the interstitial markings with chronic peribronchial thickening. No acute infiltrates or effusions. No acute bone abnormality. Old compression deformity at the thoracolumbar junction. IMPRESSION: No acute abnormality.  Chronic bronchitic changes. Electronically Signed   By: Lorriane Shire M.D.   On: 10/09/2017 12:52     CBC Recent Labs  Lab 10/29/17 2319 10/30/17 0308  WBC 11.6* 9.7  HGB 8.9* 7.6*  HCT 28.4* 24.1*  PLT 530* 416  MCV 69.7* 69.1*  MCH 22.0* 21.7*  MCHC 31.5* 31.4*  RDW 19.0* 19.1*    Chemistries  Recent Labs  Lab 10/29/17 2319 10/30/17 0308  NA 132* 134*  K 4.4 4.6  CL 104 106  CO2 15* 18*  GLUCOSE 247* 142*  BUN 53* 56*  CREATININE 3.15* 3.02*  CALCIUM 8.7* 8.4*    ------------------------------------------------------------------------------------------------------------------ estimated creatinine clearance is 19.5 mL/min (A) (by C-G formula based on SCr of 3.02 mg/dL (H)). ------------------------------------------------------------------------------------------------------------------ No results for input(s): HGBA1C in the last 72 hours. ------------------------------------------------------------------------------------------------------------------ No results for  input(s): CHOL, HDL, LDLCALC, TRIG, CHOLHDL, LDLDIRECT in the last 72 hours. ------------------------------------------------------------------------------------------------------------------ No results for input(s): TSH, T4TOTAL, T3FREE, THYROIDAB in the last 72 hours.  Invalid input(s): FREET3 ------------------------------------------------------------------------------------------------------------------ No results for input(s): VITAMINB12, FOLATE, FERRITIN, TIBC, IRON, RETICCTPCT in the last 72 hours.  Coagulation profile Recent Labs  Lab 10/30/17 0308  INR 1.22    No results for input(s): DDIMER in the last 72 hours.  Cardiac Enzymes Recent Labs  Lab 10/30/17 0308 10/30/17 0803 10/30/17 1411  TROPONINI 0.67* 1.82* 2.34*   ------------------------------------------------------------------------------------------------------------------ Invalid input(s): POCBNP    Assessment & Plan  77 year old male patient with history of diabetes mellitus type 2, hypertension, emphysema, chronic kidney disease stage IV, hyperlipidemia was admitted to ICU for non-STEMI.  1.  Non-ST elevation MI  Cardiac catheterization and further intervention by cardiology. IV heparin drip for anticoagulation.  Intensivist follow-up Continue Aspirin and betablocker  2.  Type 2 diabetes mellitus Sliding scale coverage for diabetes mellitus  n.p.o. for procedure  3.   Emphysema Stable Continue home dose inhalation treatments  4.  Chronic kidney disease stage IV .  Monitor renal function  nephrology follow-up as needed  5.  Hyperlipidemia Continue statin medication  6. GI prophylaxis with PEPCID  7. Dispostion 1 to 2 days based on cardiac cath           Code Status Orders  (From admission, onward)        Start     Ordered   10/30/17 0204  Full code  Continuous     10/30/17 0203    Code Status History    This patient has a current code status but no historical code status.      Time Spent in minutes   35 minutes  Greater than 50% of time spent in care coordination and counseling patient regarding the condition and plan of care.   Saundra Shelling M.D on 10/30/2017 at 4:07 PM  Between 7am to 6pm - Pager - [phone]  After 6pm go to [email or web address] - Proofreader  [employer]   Office  [phone]

## 2017-10-30 NOTE — Consult Note (Signed)
Cardiology Consultation:   Patient ID: DEL OVERFELT; [id number]; [birth year]   Admit date: 10/29/2017 Date of Consult: 10/30/2017  Primary Care Provider: Glendon Axe, MD Primary Cardiologist: Lujean Amel, MD Primary Electrophysiologist:  None   Patient Profile:   Bryan Brewer is a 77 y.o. male with a hx of hypertension, hyperlipidemia, type 2 diabetes mellitus, COPD with prior tobacco use, chronic kidney disease stage IV, and bladder cancer who is being seen today for the evaluation of chest pain and abnormal EKG at the request of Dr. Mable Paris.  History of Present Illness:   Bryan Brewer was in his usual state of health until approximately 10:30 this evening.  He was walking from his car to his home after going to [facility] for an evening snack.  He developed sudden onset of 6-7/10 substernal chest pressure with accompanying palpitations and nausea.  He has been having similar episodes off and on for the last 3-6 months and is currently wearing an event monitor ordered by Dr. Clayborn Bigness.  Tonight's episode lasted longer than usual, prompting his wife to bring Bryan Brewer to the ED about an hour after symptoms began.  In the ED, Bryan Brewer was noted to have diffuse ST depressions on EKG as well as ST elevation in lead aVR.  Code STEMI was called and [name] given sublingual nitroglycerin x2.  Upon my arrival in the emergency department, Bryan Brewer reports that his chest pain had resolved following nitroglycerin.  He is currently asymptomatic with no further chest pain, palpitations, or nausea.  He denies a history of prior cardiac disease.  Pharmacologic myocardial perfusion stress test in 08/2015 showed no evidence of ischemia or scar with an LVEF of 50%.  Echocardiogram at that time showed LVEF of 55% with mild MR and mild to moderate TR.  Mild RV enlargement was also noted.  Bryan Brewer has never undergone a cardiac catheterization, though he notes that it was discussed with Dr. Clayborn Bigness at  one point.  However, in the setting of CKD, this was deferred..  Past Medical History:  Diagnosis Date  . Bladder cancer ([name])   . COPD (chronic obstructive pulmonary disease) ([name])   . Hyperlipidemia   . Hypertension   . Kidney disease   . Type 2 diabetes mellitus ([name])     Past Surgical History:  Procedure Laterality Date  . REVISION UROSTOMY CUTANEOUS       Home Medications:  Prior to Admission medications   Medication Sig Start Date Zea Kostka Date Taking? Authorizing Provider  famotidine (PEPCID) 20 MG tablet Take 1 tablet (20 mg total) by mouth 2 (two) times daily. 10/09/17 11/08/17 Yes [name], MD  glimepiride (AMARYL) 1 MG tablet Take 1 mg by mouth daily. 09/17/17  Yes [provider]  metoprolol tartrate (LOPRESSOR) 25 MG tablet Take 25 mg by mouth 2 (two) times daily. 10/16/17  Yes [provider]  simvastatin (ZOCOR) 20 MG tablet Take 20 mg by mouth at bedtime. 09/16/17  Yes [provider]  sodium bicarbonate 650 MG tablet Take 1,300 mg by mouth 2 (two) times daily. 09/29/17  Yes [provider]    Inpatient Medications: Scheduled Meds:  Continuous Infusions:  PRN Meds:   Allergies:   No Known Allergies  Social History:   Social History   Socioeconomic History  . Marital status: Married    Spouse name: Not on file  . Number of children: Not on file  . Years of education: Not on file  . Highest education  level: Not on file  Occupational History  . Not on file  Social Needs  . Financial resource strain: Not on file  . Food insecurity:    Worry: Not on file    Inability: Not on file  . Transportation needs:    Medical: Not on file    Non-medical: Not on file  Tobacco Use  . Smoking status: Former Smoker    Types: Cigarettes    Last attempt to quit: 2013    Years since quitting: 6.2  . Smokeless tobacco: Never Used  Substance and Sexual Activity  . Alcohol use: No    Frequency: Never  . Drug use: Never  . Sexual  activity: Not on file  Lifestyle  . Physical activity:    Days per week: Not on file    Minutes per session: Not on file  . Stress: Not on file  Relationships  . Social connections:    Talks on phone: Not on file    Gets together: Not on file    Attends religious service: Not on file    Active member of club or organization: Not on file    Attends meetings of clubs or organizations: Not on file    Relationship status: Not on file  . Intimate partner violence:    Fear of current or ex partner: Not on file    Emotionally abused: Not on file    Physically abused: Not on file    Forced sexual activity: Not on file  Other Topics Concern  . Not on file  Social History Narrative  . Not on file    Family History:   Father: Stroke  ROS:  A 12 system review of systems was performed and was negative, except as noted in the HPI.  Physical Exam/Data:   Vitals:   10/29/17 2334 10/29/17 2337 10/29/17 2340 10/30/17 0000  BP: (!) 151/91 (!) 152/88 (!) 149/88 131/78  Pulse: (!) 110 (!) 107 (!) 109 93  Resp:  (!) 26 (!) 23 18  Temp:      TempSrc:      SpO2: 99% 99% 99% 99%  Weight:       No intake or output data in the 24 hours ending 10/30/17 0029 Filed Weights   10/29/17 2318 10/29/17 2319  Weight: 167 lb (75.8 kg) 167 lb (75.8 kg)   Body mass index is 26.16 kg/m.  General: Well-developed, well-nourished elderly man lying comfortably in a stretcher.  His wife is at the bedside. HEENT: normal Lymph: no adenopathy Neck: no JVD Endocrine:  No thryomegaly Vascular: No carotid bruits; 2+ radial pulses bilaterally. Cardiac: Regular rate and rhythm with 1/6 systolic murmur loudest at the left lower sternal border.  No rubs or gallops. Lungs: Mildly diminished breath sounds throughout without wheezes or crackles. Abd: Soft, nontender, nondistended.  Left lower quadrant ostomy is present with adjacent abdominal wall hernia. Ext: Trace ankle edema bilaterally. Musculoskeletal:  No  deformities, BUE and BLE strength normal and equal Skin: Warm and dry.  Abrasions noted on both lower extremities. Neuro:  CNs 2-12 intact, no focal abnormalities noted Psych:  Normal affect   EKG:  The EKG was personally reviewed and demonstrates: Sinus tachycardia with LVH and ST depressions in leads I and II as well as aVF and V3 through V6.  Isolated ST elevation in aVR also noted.  Repeat EKG shows decreased heart rate and slightly less pronounced ST segment abnormalities.  ST segment changes are worse compared with prior tracing  from 10/09/17. Telemetry:  Telemetry was personally reviewed and demonstrates: Normal sinus rhythm.  Relevant CV Studies: Transthoracic echocardiogram (08/23/15): Normal LV size with mild LVH. LVEF 55%.  Mild mitral regurgitation.  Mild to moderate tricuspid regurgitation.  Mildly dilated right ventricle with normal contraction.  Mild biatrial enlargement.  Pharmacologic myocardial perfusion stress test (08/23/15): No evidence of ischemia or scar.  LVEF 50% with normal wall motion. TID 1.12.  Laboratory Data:  Chemistry Recent Labs  Lab 10/29/17 2319  NA 132*  K 4.4  CL 104  CO2 15*  GLUCOSE 247*  BUN 53*  CREATININE 3.15*  CALCIUM 8.7*  GFRNONAA 18*  GFRAA 21*  ANIONGAP 13    No results for input(s): PROT, ALBUMIN, AST, ALT, ALKPHOS, BILITOT in the last 168 hours. Hematology Recent Labs  Lab 10/29/17 2319  WBC 11.6*  RBC 4.07*  HGB 8.9*  HCT 28.4*  MCV 69.7*  MCH 22.0*  MCHC 31.5*  RDW 19.0*  PLT 530*   Cardiac Enzymes Recent Labs  Lab 10/29/17 2319  TROPONINI 0.05*   No results for input(s): TROPIPOC in the last 168 hours.  BNPNo results for input(s): BNP, PROBNP in the last 168 hours.  DDimer No results for input(s): DDIMER in the last 168 hours.  Radiology/Studies:  Dg Chest 2 View  Result Date: 10/29/2017 CLINICAL DATA:  77 year old male with chest pain. EXAM: CHEST - 2 VIEW COMPARISON:  Chest radiograph dated 10/09/2017  FINDINGS: Chronic interstitial coarsening and mild bronchitic changes. There is no focal consolidation, pleural effusion, or pneumothorax. The cardiac silhouette is within normal limits. No acute osseous pathology. IMPRESSION: No active cardiopulmonary disease. Electronically Signed   By: Anner Crete M.D.   On: 10/29/2017 23:37    Assessment and Plan:   NSTEMI Mr. Kobler presentation and EKG changes are concerning for acute coronary syndrome.  Fortunately, his chest pain resolved with sublingual nitroglycerin.  EKG findings are concerning for global ischemia, as could be seen with left main or three-vessel CAD, though it is somewhat confounded by superimposed LVH.  EKG does not meet STEMI criteria at this time.  In light of resolution of chest pain and other comorbidities including advanced CKD and chronic anemia with hemoglobin of 8.9, we have agreed to defer emergent catheterization in order to optimize Mr. Shillingburg medically before proceeding with coronary angiography.  Admit to ICU for close monitoring.  Initiate heparin infusion and continue aspirin 81 mg daily.  Increase metoprolol tartrate to 50 mg twice daily.  If chest pain recurs, initiate nitroglycerin infusion with titration for relief of chest pain.  If chest pain persists, he will need to undergo emergent catheterization overnight.  Consider transfusing at least 1 unit PRBC, given significant anemia (albeit chronic) with evidence of myocardial ischemia.  Keep Mr. Umphlett NPO for likely catheterization later today.  Trend troponin until it has peaked.  Obtain transthoracic echocardiogram.  Chronic kidney disease stage IV Creatinine currently near baseline (2.5-3.5).  Initiate gentle hydration with normal saline at 75 mL/hr in anticipation of likely catheterization later today.  Avoid nephrotoxic drugs.  Consider involving nephrology given high risk for contrast-induced nephropathy.  Chronic anemia Hemoglobin is at  baseline near 9.  MCV is quite low at 70, suggesting some degree of chronic blood loss.  Underlying CKD is likely contributing as well.  Recommend type and screen as well as consideration of PRBC transfusion given myocardial ischemia.  Further workup for possible occult bleed per internal medicine.  Hypertension Blood pressure is mildly elevated.  Increase metoprolol tartrate to 50 mg twice daily.  Hyperlipidemia Most recent LDL in 08/2017 was 61.  Switch simvastatin to atorvastatin 40 mg daily.  Type 2 diabetes mellitus  Hold glimepiride in favor of sliding scale insulin, to be managed by internal medicine.  Disposition  Admit to ICU under the hospitalist service.  Ongoing cardiology care per Dr. Clayborn Bigness and [facility] with whom Mr. Isabell follows as an outpatient.  If emergent catheterization is needed overnight, please contact the on-call STEMI provider.  For questions or updates, please contact [place] Please consult [email or web address] for contact info under [facility] Card[facility].   Signed, Nelva Bush, MD  10/30/2017 12:29 AM

## 2017-10-30 NOTE — H&P (Signed)
[facility] [place] NAME: Bryan Brewer    MR#:  [id number]  DATE OF BIRTH:  [birth year]  DATE OF ADMISSION:  10/29/2017  PRIMARY CARE PHYSICIAN: Glendon Axe, MD   REQUESTING/REFERRING PHYSICIAN: Mable Paris, MD  CHIEF COMPLAINT:   Chief Complaint  Patient presents with  . Chest Pain    HISTORY OF PRESENT ILLNESS:  Bryan Brewer  is a 77 y.o. male who presents with episode of central chest pressure that started around 10:00 in the evening at his house.  Patient was not doing anything, he was laying down getting ready for bed.  He has been having recurring episodes of chest pain for which she has been evaluated here in the ED but per his report has not had anything conclusive told to him about a cause.  He was scheduled for elective cardiac catheterization in a week or so.  Tonight when he arrived to the ED his EKG showed ST elevation in aVR and ST depression fairly diffusely elsewhere.  Code STEMI was called and the patient was going to be taken for catheterization tonight emergently.  However, after he was given nitro his chest pain resolved completely.  Given that he has stage IV renal disease the cardiologist on-call felt that it might be more prudent to monitor him tonight as his symptoms had resolved and do catheterization later today and once his primary cardiology team has had time to coordinate with nephrology and potentially develop a plan for some renal preservation.  Hospitalist were called for admission  PAST MEDICAL HISTORY:   Past Medical History:  Diagnosis Date  . Bladder cancer ([place])   . COPD (chronic obstructive pulmonary disease) ([place])   . Hyperlipidemia   . Hypertension   . Kidney disease   . Type 2 diabetes mellitus ([place])      PAST SURGICAL HISTORY:   Past Surgical History:  Procedure Laterality Date  . REVISION UROSTOMY CUTANEOUS       SOCIAL HISTORY:   Social History   Tobacco Use  . Smoking  status: Former Smoker    Types: Cigarettes    Last attempt to quit: 2013    Years since quitting: 6.2  . Smokeless tobacco: Never Used  Substance Use Topics  . Alcohol use: No    Frequency: Never     FAMILY HISTORY:   Family History  Problem Relation Age of Onset  . CAD Mother   . Stroke Mother   . CAD Father   . Stroke Father   . Breast cancer Sister      DRUG ALLERGIES:  No Known Allergies  MEDICATIONS AT HOME:   Prior to Admission medications   Medication Sig Start Date End Date Taking? Authorizing Provider  famotidine (PEPCID) 20 MG tablet Take 1 tablet (20 mg total) by mouth 2 (two) times daily. 10/09/17 11/08/17 Yes [name], MD  glimepiride (AMARYL) 1 MG tablet Take 1 mg by mouth daily. 09/17/17  Yes [provider]  metoprolol tartrate (LOPRESSOR) 25 MG tablet Take 25 mg by mouth 2 (two) times daily. 10/16/17  Yes [provider]  simvastatin (ZOCOR) 20 MG tablet Take 20 mg by mouth at bedtime. 09/16/17  Yes [provider]  sodium bicarbonate 650 MG tablet Take 1,300 mg by mouth 2 (two) times daily. 09/29/17  Yes [provider]    REVIEW OF SYSTEMS:  Review of Systems  Constitutional: Negative for chills, fever, malaise/fatigue and weight loss.  HENT: Negative  for ear pain, hearing loss and tinnitus.   Eyes: Negative for blurred vision, double vision, pain and redness.  Respiratory: Negative for cough, hemoptysis and shortness of breath.   Cardiovascular: Positive for chest pain. Negative for palpitations, orthopnea and leg swelling.  Gastrointestinal: Negative for abdominal pain, constipation, diarrhea, nausea and vomiting.  Genitourinary: Negative for dysuria, frequency and hematuria.  Musculoskeletal: Negative for back pain, joint pain and neck pain.  Skin:       No acne, rash, or lesions  Neurological: Negative for dizziness, tremors, focal weakness and weakness.  Endo/Heme/Allergies: Negative for polydipsia. Does  not bruise/bleed easily.  Psychiatric/Behavioral: Negative for depression. The patient is not nervous/anxious and does not have insomnia.      VITAL SIGNS:   Vitals:   10/29/17 2334 10/29/17 2337 10/29/17 2340 10/30/17 0000  BP: (!) 151/91 (!) 152/88 (!) 149/88 131/78  Pulse: (!) 110 (!) 107 (!) 109 93  Resp:  (!) 26 (!) 23 18  Temp:      TempSrc:      SpO2: 99% 99% 99% 99%  Weight:       Wt Readings from Last 3 Encounters:  10/29/17 75.8 kg (167 lb)  10/09/17 75.8 kg (167 lb)  08/12/17 77.1 kg (170 lb)    PHYSICAL EXAMINATION:  Physical Exam  Vitals reviewed. Constitutional: He is oriented to person, place, and time. He appears well-developed and well-nourished. No distress.  HENT:  Head: Normocephalic and atraumatic.  Mouth/Throat: Oropharynx is clear and moist.  Eyes: Pupils are equal, round, and reactive to light. Conjunctivae and EOM are normal. No scleral icterus.  Neck: Normal range of motion. Neck supple. No JVD present. No thyromegaly present.  Cardiovascular: Normal rate, regular rhythm and intact distal pulses. Exam reveals no gallop and no friction rub.  No murmur heard. Respiratory: Effort normal and breath sounds normal. No respiratory distress. He has no wheezes. He has no rales.  GI: Soft. Bowel sounds are normal. He exhibits no distension. There is no tenderness.  Musculoskeletal: Normal range of motion. He exhibits no edema.  No arthritis, no gout  Lymphadenopathy:    He has no cervical adenopathy.  Neurological: He is alert and oriented to person, place, and time. No cranial nerve deficit.  No dysarthria, no aphasia  Skin: Skin is warm and dry. No rash noted. No erythema.  Psychiatric: He has a normal mood and affect. His behavior is normal. Judgment and thought content normal.    LABORATORY PANEL:   CBC Recent Labs  Lab 10/29/17 2319  WBC 11.6*  HGB 8.9*  HCT 28.4*  PLT 530*    ------------------------------------------------------------------------------------------------------------------  Chemistries  Recent Labs  Lab 10/29/17 2319  NA 132*  K 4.4  CL 104  CO2 15*  GLUCOSE 247*  BUN 53*  CREATININE 3.15*  CALCIUM 8.7*   ------------------------------------------------------------------------------------------------------------------  Cardiac Enzymes Recent Labs  Lab 10/29/17 2319  TROPONINI 0.05*   ------------------------------------------------------------------------------------------------------------------  RADIOLOGY:  Dg Chest 2 View  Result Date: 10/29/2017 CLINICAL DATA:  77 year old male with chest pain. EXAM: CHEST - 2 VIEW COMPARISON:  Chest radiograph dated 10/09/2017 FINDINGS: Chronic interstitial coarsening and mild bronchitic changes. There is no focal consolidation, pleural effusion, or pneumothorax. The cardiac silhouette is within normal limits. No acute osseous pathology. IMPRESSION: No active cardiopulmonary disease. Electronically Signed   By: Anner Crete M.D.   On: 10/29/2017 23:37    EKG:   Orders placed or performed during the hospital encounter of 10/29/17  . ED EKG within  10 minutes  . ED EKG within 10 minutes    IMPRESSION AND PLAN:  Principal Problem:   NSTEMI (non-ST elevated myocardial infarction) ([place]) -troponin only mildly elevated at this point, though I do expect it to rise.  Patient had significant symptoms, and initially was felt to perhaps have STEMI.  However, his symptoms resolved completely with administration of nitro.  We have him on a heparin drip.  Will admit him to the ICU.  We will have his cardiology team see him, most likely for cardiac catheterization. Active Problems:   Diabetes ([place]) -sliding scale insulin with corresponding glucose checks   HTN (hypertension) -stable, continue home meds   COPD (chronic obstructive pulmonary disease) ([facility]) -home dose inhalers   CKD (chronic kidney  disease), stage IV ([place]) -patient's renal function is at baseline, cardiology team might consider contacting his primary nephrologist or perhaps consulting her nephrologist here in order to coordinate for renal preservation during cardiac cath   HLD (hyperlipidemia) -home dose statin  Chart review performed and case discussed with ED provider. Labs, imaging and/or ECG reviewed by provider and discussed with patient/family. Management plans discussed with the patient and/or family.  DVT PROPHYLAXIS: Systemic anticoagulation  GI PROPHYLAXIS: H2 blocker  ADMISSION STATUS: Inpatient  CODE STATUS: Full  TOTAL CRITICAL CARE TIME TAKING CARE OF THIS PATIENT: 50 minutes.   Bruno Leach [place] 10/30/2017, 12:35 AM  [facility]  Office  [phone]  CC: Primary care physician; Glendon Axe, MD  Note:  This document was prepared using Dragon voice recognition software and may include unintentional dictation errors.

## 2017-10-30 NOTE — Progress Notes (Signed)
PT came in with possible heart attack in progress.  DR delayed cath few hours while PT hydrated.  [facility] offered spiritual support, prayed with PT, wife, wife's sister.  Preachers from the family enroute.

## 2017-10-30 NOTE — Consult Note (Signed)
[facility] Consultation Note  Patient ID: Bryan Brewer, MRN: [id number], DOB/AGE: Jun 11, 1941 77 y.o. Admit date: 10/29/2017   Date of Consult: 10/30/2017 Primary Physician: Glendon Axe, MD Primary Cardiologist: Call would  Chief Complaint:  Chief Complaint  Patient presents with  . Chest Pain   Reason for Consult: Chest pain  HPI: 77 y.o. male with known apparent bladder cancer with multiple issues and concerns status post surgery bladder and severe chronic kidney disease stage IV who has had some recent concerns of off and on chest discomfort as well as shortness of breath with and without physical activity.  This is been waxing and waning over several months but culminated in a significant chest discomfort and pressure seen in the emergency room.  At that time the patient had diffuse ST depression of 1-2 mm throughout the precordium and inferior leads.  The patient had full relief with heparin as well as treatment with nitrates.  Currently the patient is stable with no further evidence of chest discomfort.  EKG changes are back to normal.  The patient has had no current concerns of chest pain.  Patient does have an elevated troponin of 0.6 consistent with a non-ST elevation myocardial infarction.  Due to the patient's severe anemia of hemoglobin of 7.6 and due to his chronic kidney disease stage IV the patient will be medically managed until further treatment of these issues and then further consideration for cardiac catheterization to treat concerns of severe diffuse coronary artery disease  Past Medical History:  Diagnosis Date  . Bladder cancer ([place])   . COPD (chronic obstructive pulmonary disease) ([place])   . Hyperlipidemia   . Hypertension   . Kidney disease   . Type 2 diabetes mellitus [facility])       Surgical History:  Past Surgical History:  Procedure Laterality Date  . REVISION UROSTOMY CUTANEOUS       Home Meds: Prior to Admission medications   Medication  Sig Start Date End Date Taking? Authorizing Provider  famotidine (PEPCID) 20 MG tablet Take 1 tablet (20 mg total) by mouth 2 (two) times daily. 10/09/17 11/08/17 Yes [name], MD  glimepiride (AMARYL) 1 MG tablet Take 1 mg by mouth daily. 09/17/17  Yes [provider]  metoprolol tartrate (LOPRESSOR) 25 MG tablet Take 25 mg by mouth 2 (two) times daily. 10/16/17  Yes [provider]  simvastatin (ZOCOR) 20 MG tablet Take 20 mg by mouth at bedtime. 09/16/17  Yes [provider]  sodium bicarbonate 650 MG tablet Take 1,300 mg by mouth 2 (two) times daily. 09/29/17  Yes [provider]    Inpatient Medications:  . Chlorhexidine Gluconate Cloth  6 each Topical Q0600  . famotidine  20 mg Oral Daily  . insulin aspart  0-9 Units Subcutaneous Q4H  . metoprolol tartrate  50 mg Oral BID  . mupirocin ointment  1 application Nasal BID  . simvastatin  20 mg Oral QHS  . sodium bicarbonate  1,300 mg Oral BID   . sodium chloride 75 mL/hr at 10/30/17 0600  . sodium chloride    . heparin 900 Units/hr (10/30/17 0600)    Allergies: No Known Allergies  Social History   Socioeconomic History  . Marital status: Married    Spouse name: Not on file  . Number of children: Not on file  . Years of education: Not on file  . Highest education level: Not on file  Occupational History  . Not on file  Social  Needs  . Financial resource strain: Not on file  . Food insecurity:    Worry: Not on file    Inability: Not on file  . Transportation needs:    Medical: Not on file    Non-medical: Not on file  Tobacco Use  . Smoking status: Former Smoker    Types: Cigarettes    Last attempt to quit: 2013    Years since quitting: 6.2  . Smokeless tobacco: Never Used  Substance and Sexual Activity  . Alcohol use: No    Frequency: Never  . Drug use: Never  . Sexual activity: Not on file  Lifestyle  . Physical activity:    Days per week: Not on file    Minutes per  session: Not on file  . Stress: Not on file  Relationships  . Social connections:    Talks on phone: Not on file    Gets together: Not on file    Attends religious service: Not on file    Active member of club or organization: Not on file    Attends meetings of clubs or organizations: Not on file    Relationship status: Not on file  . Intimate partner violence:    Fear of current or ex partner: Not on file    Emotionally abused: Not on file    Physically abused: Not on file    Forced sexual activity: Not on file  Other Topics Concern  . Not on file  Social History Narrative  . Not on file     Family History  Problem Relation Age of Onset  . CAD Mother   . Stroke Mother   . CAD Father   . Stroke Father   . Breast cancer Sister      Review of Systems Positive for chest pain pressure Negative for: General:  chills, fever, night sweats or weight changes.  Cardiovascular: PND orthopnea syncope dizziness  Dermatological skin lesions rashes Respiratory: Cough congestion Urologic: Frequent urination urination at night and hematuria Abdominal: negative for nausea, vomiting, diarrhea, bright red blood per rectum, melena, or hematemesis Neurologic: negative for visual changes, and/or hearing changes  All other systems reviewed and are otherwise negative except as noted above.  Labs: Recent Labs    10/29/17 2319 10/30/17 0308  TROPONINI 0.05* 0.67*   Lab Results  Component Value Date   WBC 9.7 10/30/2017   HGB 7.6 (L) 10/30/2017   HCT 24.1 (L) 10/30/2017   MCV 69.1 (L) 10/30/2017   PLT 416 10/30/2017    Recent Labs  Lab 10/30/17 0308  NA 134*  K 4.6  CL 106  CO2 18*  BUN 56*  CREATININE 3.02*  CALCIUM 8.4*  GLUCOSE 142*   No results found for: CHOL, HDL, LDLCALC, TRIG No results found for: DDIMER  Radiology/Studies:  Dg Chest 2 View  Result Date: 10/29/2017 CLINICAL DATA:  77 year old male with chest pain. EXAM: CHEST - 2 VIEW COMPARISON:  Chest  radiograph dated 10/09/2017 FINDINGS: Chronic interstitial coarsening and mild bronchitic changes. There is no focal consolidation, pleural effusion, or pneumothorax. The cardiac silhouette is within normal limits. No acute osseous pathology. IMPRESSION: No active cardiopulmonary disease. Electronically Signed   By: Anner Crete M.D.   On: 10/29/2017 23:37   Dg Chest 2 View  Result Date: 10/09/2017 CLINICAL DATA:  Chest pain. EXAM: CHEST - 2 VIEW COMPARISON:  08/12/2017 FINDINGS: Heart size and pulmonary vascularity are normal. Chronic accentuation of the interstitial markings with chronic peribronchial thickening. No acute  infiltrates or effusions. No acute bone abnormality. Old compression deformity at the thoracolumbar junction. IMPRESSION: No acute abnormality.  Chronic bronchitic changes. Electronically Signed   By: Lorriane Shire M.D.   On: 10/09/2017 12:52    EKG: Normal sinus rhythm with diffuse ST depression consistent with ischemia  Weights: Filed Weights   10/29/17 2318 10/29/17 2319 10/30/17 0200  Weight: 167 lb (75.8 kg) 167 lb (75.8 kg) 163 lb 9.3 oz (74.2 kg)     Physical Exam: Blood pressure 128/69, pulse 75, temperature 97.7 F (36.5 C), temperature source Oral, resp. rate 12, height 5\' 7"  (1.702 m), weight 163 lb 9.3 oz (74.2 kg), SpO2 99 %. Body mass index is 25.62 kg/m. General: Well developed, well nourished, in no acute distress. Head eyes ears nose throat: Normocephalic, atraumatic, sclera non-icteric, no xanthomas, nares are without discharge. No apparent thyromegaly and/or mass  Lungs: Normal respiratory effort.  Few wheezes, no rales, no rhonchi.  Heart: RRR with normal S1 S2. no murmur gallop, no rub, PMI is normal size and placement, carotid upstroke normal without bruit, jugular venous pressure is normal Abdomen: Soft, non-tender, non-distended with normoactive bowel sounds. No hepatomegaly. No rebound/guarding. No obvious abdominal masses. Abdominal aorta is  normal size without bruit Extremities: No edema. no cyanosis, no clubbing, no ulcers  Peripheral : 2+ bilateral upper extremity pulses, 2+ bilateral femoral pulses, 2+ bilateral dorsal pedal pulse Neuro: Alert and oriented. No facial asymmetry. No focal deficit. Moves all extremities spontaneously. Musculoskeletal: Normal muscle tone without kyphosis Psych:  Responds to questions appropriately with a normal affect.    Assessment: 76 year old male with bladder cancer severe anemia and chronic kidney disease with a non-ST elevation myocardial infarction with concomitant severe chest pain and shortness of breath  Plan: 1.  Heparin for further risk reduction of myocardial infarction 2.  Continue treatment of chronic kidney disease watching for acute kidney injury and further consultation with nephrology for advice on approach for cardiac catheterization for diagnosis and treatment of severe coronary artery disease and non-ST T elevation cardial infarction 3.  Beta-blocker for heart rate control and risk reduction of recurrent chest discomfort as well as addition of nitrates 4.  Echocardiogram for LV systolic dysfunction valvular heart disease 5.  Further consideration of treatment of anemia prior to cardiac intervention due to significant risks of using antiplatelet medication management  Signed, Corey Skains M.D. [facility] 10/30/2017, 8:12 AM

## 2017-10-30 NOTE — ED Notes (Signed)
No chest pain at this time after 2 ntg.  Dr End still with pt and family.

## 2017-10-30 NOTE — Progress Notes (Signed)
*  PRELIMINARY RESULTS* Echocardiogram 2D Echocardiogram has been performed.  Bryan Brewer 10/30/2017, 3:00 PM

## 2017-10-30 NOTE — ED Notes (Signed)
Report called to Clyda Greener icu nurse

## 2017-10-31 ENCOUNTER — Encounter
Admission: EM | Disposition: A | Discharge status: Other Acute Inpatient Hospital | Payer: Self-pay | Source: Home / Self Care | Attending: Internal Medicine

## 2017-10-31 DIAGNOSIS — N183 Chronic kidney disease, stage 3 (moderate): Secondary | ICD-10-CM

## 2017-10-31 DIAGNOSIS — N179 Acute kidney failure, unspecified: Secondary | ICD-10-CM

## 2017-10-31 HISTORY — PX: LEFT HEART CATH AND CORONARY ANGIOGRAPHY: CATH118249

## 2017-10-31 LAB — GLUCOSE, CAPILLARY
GLUCOSE-CAPILLARY: 125 mg/dL — AB (ref 65–99)
Glucose-Capillary: 102 mg/dL — ABNORMAL HIGH (ref 65–99)
Glucose-Capillary: 109 mg/dL — ABNORMAL HIGH (ref 65–99)
Glucose-Capillary: 117 mg/dL — ABNORMAL HIGH (ref 65–99)
Glucose-Capillary: 125 mg/dL — ABNORMAL HIGH (ref 65–99)
Glucose-Capillary: 131 mg/dL — ABNORMAL HIGH (ref 65–99)
Glucose-Capillary: 135 mg/dL — ABNORMAL HIGH (ref 65–99)

## 2017-10-31 LAB — ECHOCARDIOGRAM COMPLETE
Height: 67 in
Weight: 2617.3 oz

## 2017-10-31 LAB — RENAL FUNCTION PANEL
ALBUMIN: 2.9 g/dL — AB (ref 3.5–5.0)
ANION GAP: 9 (ref 5–15)
BUN: 58 mg/dL — AB (ref 6–20)
CHLORIDE: 112 mmol/L — AB (ref 101–111)
CO2: 18 mmol/L — ABNORMAL LOW (ref 22–32)
Calcium: 8.3 mg/dL — ABNORMAL LOW (ref 8.9–10.3)
Creatinine, Ser: 2.66 mg/dL — ABNORMAL HIGH (ref 0.61–1.24)
GFR calc Af Amer: 25 mL/min — ABNORMAL LOW (ref >60)
GFR, EST NON AFRICAN AMERICAN: 22 mL/min — AB (ref >60)
Glucose, Bld: 121 mg/dL — ABNORMAL HIGH (ref 65–99)
PHOSPHORUS: 3.5 mg/dL (ref 2.5–4.6)
POTASSIUM: 5.4 mmol/L — AB (ref 3.5–5.1)
Sodium: 139 mmol/L (ref 135–145)

## 2017-10-31 LAB — URINALYSIS, COMPLETE (UACMP) WITH MICROSCOPIC
BILIRUBIN URINE: NEGATIVE
GLUCOSE, UA: NEGATIVE mg/dL
KETONES UR: NEGATIVE mg/dL
Nitrite: NEGATIVE
PH: 7 (ref 5.0–8.0)
Protein, ur: NEGATIVE mg/dL
Specific Gravity, Urine: 1.008 (ref 1.005–1.030)
Squamous Epithelial / LPF: NONE SEEN

## 2017-10-31 LAB — CBC
HEMATOCRIT: 29.1 % — AB (ref 40.0–52.0)
HEMOGLOBIN: 9.2 g/dL — AB (ref 13.0–18.0)
MCH: 22.8 pg — ABNORMAL LOW (ref 26.0–34.0)
MCHC: 31.6 g/dL — AB (ref 32.0–36.0)
MCV: 72.1 fL — AB (ref 80.0–100.0)
Platelets: 478 10*3/uL — ABNORMAL HIGH (ref 150–440)
RBC: 4.03 MIL/uL — ABNORMAL LOW (ref 4.40–5.90)
RDW: 19.4 % — AB (ref 11.5–14.5)
WBC: 7.9 10*3/uL (ref 3.8–10.6)

## 2017-10-31 LAB — TYPE AND SCREEN
ABO/RH(D): A POS
Antibody Screen: NEGATIVE
UNIT DIVISION: 0

## 2017-10-31 LAB — BPAM RBC
Blood Product Expiration Date: 201903312359
ISSUE DATE / TIME: 201903271417
Unit Type and Rh: 600

## 2017-10-31 LAB — HEPARIN LEVEL (UNFRACTIONATED): HEPARIN UNFRACTIONATED: 0.3 [IU]/mL (ref 0.30–0.70)

## 2017-10-31 LAB — POTASSIUM: Potassium: 4.9 mmol/L (ref 3.5–5.1)

## 2017-10-31 SURGERY — LEFT HEART CATH AND CORONARY ANGIOGRAPHY
Anesthesia: Moderate Sedation

## 2017-10-31 MED ORDER — SODIUM CHLORIDE 0.9% FLUSH: 3.0000 mL | Freq: Two times a day (BID) | INTRAVENOUS | Status: DC

## 2017-10-31 MED ORDER — SODIUM CHLORIDE 0.9 % IV SOLN
1.0000 g | INTRAVENOUS | Status: DC
  Filled 2017-10-31: qty 10

## 2017-10-31 MED ORDER — ASPIRIN 81 MG PO CHEW
81.0000 mg | CHEWABLE_TABLET | Freq: Every day | ORAL | Status: DC
  Administered 2017-10-31: 81 mg via ORAL
  Filled 2017-10-31 (×2): qty 1

## 2017-10-31 MED ORDER — SODIUM CHLORIDE 0.9 % WEIGHT BASED INFUSION: 1.0000 mL/kg/h | INTRAVENOUS | Status: DC

## 2017-10-31 MED ORDER — SODIUM CHLORIDE 0.9% FLUSH: 3.0000 mL | INTRAVENOUS | Status: DC | PRN

## 2017-10-31 MED ORDER — SODIUM CHLORIDE 0.9 % WEIGHT BASED INFUSION: 3.0000 mL/kg/h | INTRAVENOUS | Status: DC

## 2017-10-31 MED ORDER — CEFTRIAXONE SODIUM 1 G IJ SOLR
1.0000 g | INTRAMUSCULAR | Status: DC
  Administered 2017-11-01: 1 g via INTRAVENOUS
  Filled 2017-10-31 (×2): qty 10

## 2017-10-31 MED ORDER — SODIUM CHLORIDE 0.9 % IV SOLN: 250.0000 mL | INTRAVENOUS | Status: DC | PRN

## 2017-10-31 MED ORDER — MIDAZOLAM HCL 2 MG/2ML IJ SOLN
INTRAMUSCULAR | Status: AC
  Filled 2017-10-31: qty 2

## 2017-10-31 MED ORDER — MIDAZOLAM HCL 2 MG/2ML IJ SOLN
INTRAMUSCULAR | Status: DC | PRN
  Administered 2017-10-31: 1 mg via INTRAVENOUS

## 2017-10-31 MED ORDER — ONDANSETRON HCL 4 MG/2ML IJ SOLN: 4.0000 mg | Freq: Four times a day (QID) | INTRAMUSCULAR | Status: DC | PRN

## 2017-10-31 MED ORDER — SODIUM CHLORIDE 0.9% FLUSH
3.0000 mL | Freq: Two times a day (BID) | INTRAVENOUS | Status: DC
  Administered 2017-10-31: 3 mL via INTRAVENOUS

## 2017-10-31 MED ORDER — HEPARIN (PORCINE) IN NACL 2-0.9 UNIT/ML-% IJ SOLN
INTRAMUSCULAR | Status: AC
  Filled 2017-10-31: qty 1000

## 2017-10-31 MED ORDER — IOPAMIDOL (ISOVUE-300) INJECTION 61%
INTRAVENOUS | Status: DC | PRN
  Administered 2017-10-31: 50 mL via INTRA_ARTERIAL

## 2017-10-31 MED ORDER — ASPIRIN 81 MG PO CHEW: 81.0000 mg | CHEWABLE_TABLET | ORAL | Status: DC

## 2017-10-31 MED ORDER — ACETAMINOPHEN 325 MG PO TABS: 650.0000 mg | ORAL_TABLET | ORAL | Status: DC | PRN

## 2017-10-31 MED ORDER — SODIUM CHLORIDE 0.9% FLUSH
3.0000 mL | Freq: Two times a day (BID) | INTRAVENOUS | Status: DC
  Administered 2017-10-31 – 2017-11-01 (×3): 3 mL via INTRAVENOUS

## 2017-10-31 MED ORDER — ASPIRIN 81 MG PO CHEW
CHEWABLE_TABLET | ORAL | Status: AC
  Administered 2017-10-31: 81 mg via ORAL
  Filled 2017-10-31: qty 1

## 2017-10-31 MED ORDER — FENTANYL CITRATE (PF) 100 MCG/2ML IJ SOLN
INTRAMUSCULAR | Status: AC
  Filled 2017-10-31: qty 2

## 2017-10-31 MED ORDER — ASPIRIN 81 MG PO CHEW
81.0000 mg | CHEWABLE_TABLET | ORAL | Status: AC
  Administered 2017-10-31: 81 mg via ORAL

## 2017-10-31 SURGICAL SUPPLY — 8 items
CATH INFINITI 5FR ANG PIGTAIL (CATHETERS) ×3 IMPLANT
CATH INFINITI 5FR JL4 (CATHETERS) ×3 IMPLANT
CATH INFINITI JR4 5F (CATHETERS) ×3 IMPLANT
KIT MANI 3VAL PERCEP (MISCELLANEOUS) ×3 IMPLANT
NEEDLE PERC 18GX7CM (NEEDLE) ×3 IMPLANT
PACK CARDIAC CATH (CUSTOM PROCEDURE TRAY) ×3 IMPLANT
SHEATH AVANTI 5FR X 11CM (SHEATH) ×3 IMPLANT
WIRE GUIDERIGHT .035X150 (WIRE) ×3 IMPLANT

## 2017-10-31 NOTE — Progress Notes (Signed)
[place] [facility]                                                                                                                                                                                  Patient Demographics   Bryan Brewer, is a 77 y.o. male, DOB - June 13, 1941, GYF:[id number]  Admit date - 10/29/2017   Admitting Physician Lance Coon, MD  Outpatient Primary MD for the patient is Glendon Axe, MD   LOS - 1  Subjective: Patient seen and evaluated by me today Feels better No complaints of any chest pain  no shortness of breath  No palpitations   Review of Systems:   CONSTITUTIONAL: No documented fever. No fatigue, weakness. No weight gain, no weight loss.  EYES: No blurry or double vision.  ENT: No tinnitus. No postnasal drip. No redness of the oropharynx.  RESPIRATORY: No cough, no wheeze, no hemoptysis. No dyspnea.  CARDIOVASCULAR: No chest pain. No orthopnea. No palpitations. No syncope.  GASTROINTESTINAL: No nausea, no vomiting or diarrhea. No abdominal pain. No melena or hematochezia.  GENITOURINARY: No dysuria or hematuria.  ENDOCRINE: No polyuria or nocturia. No heat or cold intolerance.  HEMATOLOGY: No anemia. No bruising. No bleeding.  INTEGUMENTARY: No rashes. No lesions.  MUSCULOSKELETAL: No arthritis. No swelling. No gout.  NEUROLOGIC: No numbness, tingling, or ataxia. No seizure-type activity.  PSYCHIATRIC: No anxiety. No insomnia. No ADD.    Vitals:   Vitals:   10/31/17 1000 10/31/17 1100 10/31/17 1200 10/31/17 1317  BP: (!) 150/71 (!) 149/74 (!) 145/121 (!) 151/82  Pulse: 71 74 79 66  Resp: (!) 23 20 (!) 26 15  Temp:   97.7 F (36.5 C) 98 F (36.7 C)  TempSrc:    Oral  SpO2: 96% 97% 100% 98%  Weight:   74.2 kg (163 lb 9.3 oz) 73.9 kg (163 lb)  Height:    5\' 7"  (1.702 m)    Wt Readings from Last 3 Encounters:  10/31/17 73.9 kg (163 lb)  10/09/17 75.8 kg (167 lb)  08/12/17 77.1 kg (170 lb)     Intake/Output  Summary (Last 24 hours) at 10/31/2017 1447 Last data filed at 10/31/2017 1000 Gross per 24 hour  Intake 302.79 ml  Output 1590 ml  Net -1287.21 ml    Physical Exam:   GENERAL: Pleasant-appearing in no apparent distress.  HEAD, EYES, EARS, NOSE AND THROAT: Atraumatic, normocephalic. Extraocular muscles are intact. Pupils equal and reactive to light. Sclerae anicteric. No conjunctival injection. No oro-pharyngeal erythema.  NECK: Supple. There is no jugular venous distention. No bruits, no lymphadenopathy, no thyromegaly.  HEART: Regular rate and rhythm,. No murmurs, no rubs,  no clicks.  LUNGS: Clear to auscultation bilaterally. No rales or rhonchi. No wheezes.  ABDOMEN: Soft, flat, nontender, nondistended. Has good bowel sounds. No hepatosplenomegaly appreciated.  EXTREMITIES: No evidence of any cyanosis, clubbing, or peripheral edema.  +2 pedal and radial pulses bilaterally.  NEUROLOGIC: The patient is alert, awake, and oriented x3 with no focal motor or sensory deficits appreciated bilaterally.  SKIN: Moist and warm with no rashes appreciated.  Psych: Not anxious, depressed LN: No inguinal LN enlargement    Antibiotics   Anti-infectives (From admission, onward)   Start     Dose/Rate Route Frequency Ordered Stop   10/31/17 0500  [MAR Hold]  cefTRIAXone (ROCEPHIN) 1 g in sodium chloride 0.9 % 100 mL IVPB     (MAR Hold since Thu 10/31/2017 at 1314. Reason: Transfer to a Procedural area.)   1 g 200 mL/hr over 30 Minutes Intravenous Every 24 hours 10/31/17 0444        Medications   Scheduled Meds: . [START ON 11/01/2017] aspirin  81 mg Oral Pre-Cath  . [MAR Hold] Chlorhexidine Gluconate Cloth  6 each Topical Q0600  . [MAR Hold] famotidine  20 mg Oral Daily  . [MAR Hold] insulin aspart  0-9 Units Subcutaneous Q4H  . [MAR Hold] metoprolol tartrate  50 mg Oral BID  . [MAR Hold] mupirocin ointment  1 application Nasal BID  . [MAR Hold] simvastatin  20 mg Oral QHS  . [MAR Hold] sodium  bicarbonate  1,300 mg Oral BID  . sodium chloride flush  3 mL Intravenous Q12H  . sodium chloride flush  3 mL Intravenous Q12H   Continuous Infusions: . sodium chloride 75 mL/hr at 10/30/17 0600  . [MAR Hold] sodium chloride    . sodium chloride    . sodium chloride    . [START ON 11/01/2017] sodium chloride     Followed by  . [START ON 11/01/2017] sodium chloride    . [START ON 11/01/2017] sodium chloride     Followed by  . [START ON 11/01/2017] sodium chloride    . [MAR Hold] cefTRIAXone (ROCEPHIN) IVPB 1 gram/100 mL NS (Mini-Bag Plus)    . heparin 1,150 Units/hr (10/30/17 2002)   PRN Meds:.sodium chloride, sodium chloride, [MAR Hold] acetaminophen **OR** [MAR Hold] acetaminophen, [MAR Hold] ondansetron **OR** [MAR Hold] ondansetron (ZOFRAN) IV, sodium chloride flush, sodium chloride flush   Data Review:   Micro Results Recent Results (from the past 240 hour(s))  MRSA PCR Screening     Status: Abnormal   Collection Time: 10/30/17  2:11 AM  Result Value Ref Range Status   MRSA by PCR POSITIVE (A) NEGATIVE Final    Comment:        The GeneXpert MRSA Assay (FDA approved for NASAL specimens only), is one component of a comprehensive MRSA colonization surveillance program. It is not intended to diagnose MRSA infection nor to guide or monitor treatment for MRSA infections. RESULT CALLED TO, READ BACK BY AND VERIFIED WITH: BARBARA THAO AT 0335 ON 10/30/17 [place]. Performed at [facility], [address] [place], [place] [address]     Radiology Reports Dg Chest 2 View  Result Date: 10/29/2017 CLINICAL DATA:  77 year old male with chest pain. EXAM: CHEST - 2 VIEW COMPARISON:  Chest radiograph dated 10/09/2017 FINDINGS: Chronic interstitial coarsening and mild bronchitic changes. There is no focal consolidation, pleural effusion, or pneumothorax. The cardiac silhouette is within normal limits. No acute osseous pathology. IMPRESSION: No active cardiopulmonary disease.  Electronically Signed   By: Milas Hock  Radparvar M.D.   On: 10/29/2017 23:37   Dg Chest 2 View  Result Date: 10/09/2017 CLINICAL DATA:  Chest pain. EXAM: CHEST - 2 VIEW COMPARISON:  08/12/2017 FINDINGS: Heart size and pulmonary vascularity are normal. Chronic accentuation of the interstitial markings with chronic peribronchial thickening. No acute infiltrates or effusions. No acute bone abnormality. Old compression deformity at the thoracolumbar junction. IMPRESSION: No acute abnormality.  Chronic bronchitic changes. Electronically Signed   By: Lorriane Shire M.D.   On: 10/09/2017 12:52     CBC Recent Labs  Lab 10/29/17 2319 10/30/17 0308 10/30/17 1904 10/31/17 0548  WBC 11.6* 9.7 7.0 7.9  HGB 8.9* 7.6* 9.2* 9.2*  HCT 28.4* 24.1* 27.6* 29.1*  PLT 530* 416 417 478*  MCV 69.7* 69.1* 70.8* 72.1*  MCH 22.0* 21.7* 23.5* 22.8*  MCHC 31.5* 31.4* 33.2 31.6*  RDW 19.0* 19.1* 19.6* 19.4*    Chemistries  Recent Labs  Lab 10/29/17 2319 10/30/17 0308 10/31/17 0548 10/31/17 1125  NA 132* 134* 139  --   K 4.4 4.6 5.4* 4.9  CL 104 106 112*  --   CO2 15* 18* 18*  --   GLUCOSE 247* 142* 121*  --   BUN 53* 56* 58*  --   CREATININE 3.15* 3.02* 2.66*  --   CALCIUM 8.7* 8.4* 8.3*  --    ------------------------------------------------------------------------------------------------------------------ estimated creatinine clearance is 22.1 mL/min (A) (by C-G formula based on SCr of 2.66 mg/dL (H)). ------------------------------------------------------------------------------------------------------------------ No results for input(s): HGBA1C in the last 72 hours. ------------------------------------------------------------------------------------------------------------------ No results for input(s): CHOL, HDL, LDLCALC, TRIG, CHOLHDL, LDLDIRECT in the last 72 hours. ------------------------------------------------------------------------------------------------------------------ No results for  input(s): TSH, T4TOTAL, T3FREE, THYROIDAB in the last 72 hours.  Invalid input(s): FREET3 ------------------------------------------------------------------------------------------------------------------ No results for input(s): VITAMINB12, FOLATE, FERRITIN, TIBC, IRON, RETICCTPCT in the last 72 hours.  Coagulation profile Recent Labs  Lab 10/30/17 0308  INR 1.22    No results for input(s): DDIMER in the last 72 hours.  Cardiac Enzymes Recent Labs  Lab 10/30/17 0308 10/30/17 0803 10/30/17 1411  TROPONINI 0.67* 1.82* 2.34*   ------------------------------------------------------------------------------------------------------------------ Invalid input(s): POCBNP    Assessment & Plan  77 year old male patient with history of diabetes mellitus type 2, hypertension, emphysema, chronic kidney disease stage IV, hyperlipidemia was admitted to ICU for non-STEMI.  1.  Non-ST elevation MI  Cardiac catheterization and further intervention by cardiology today IV heparin drip for anticoagulation.  Intensivist follow-up Continue Aspirin and betablocker  2.  Type 2 diabetes mellitus Sliding scale coverage for diabetes mellitus  n.p.o. for procedure  3.  Emphysema Stable Continue home dose inhalation treatments  4.  Chronic kidney disease stage IV .  Monitor renal function  nephrology follow-up appreciated  5.  Hyperlipidemia Continue statin medication  6. GI prophylaxis with PEPCID  7. Dispostion 1 to 2 days based on cardiac cath           Code Status Orders  (From admission, onward)        Start     Ordered   10/30/17 0204  Full code  Continuous     10/30/17 0203    Code Status History    This patient has a current code status but no historical code status.      Time Spent in minutes   34 minutes  Greater than 50% of time spent in care coordination and counseling patient regarding the condition and plan of care.   Saundra Shelling M.D on 10/31/2017 at  2:47 PM  Between 7am  to 6pm - Pager - [phone]  After 6pm go to [email or web address] - Proofreader  [employer]   Office  [phone]

## 2017-10-31 NOTE — Progress Notes (Signed)
[place] Report called to [facility] on 2A.

## 2017-10-31 NOTE — Consult Note (Addendum)
[facility] Nurse ostomy consult note Stoma type/location: LLQ urostomy since 07/2012 from bladder cancer.  Seen by [facility] specialist recently for peristomal breakdown. Stoma is oval and flush.  Stomal assessment/size: 1 1/8"  Peristomal assessment: not assessed.  In cath lab at this time.  Treatment options for stomal/peristomal skin: barrier ring Output urine Ostomy pouching: 2pc. 2 3/4" pouch with barrier ring Education provided: Patient in cath lab will see in Am to assess for ongoing needs.  Pouch beginning to leak and bedside RN will change when he returns to ICU.  Was unsure of needed supplies. Given adaptor for bedside drainage.  Enrolled patient in [facility] program: No 3 pouch sets with barrier ring and no sting skin prep left with bedside RN who will assist patient as needed.  He is alert and oriented and independent with self care.  [place] team will follow.  Will assess for need for belt tomorrow due to presence of umbilical hernia.   Domenic Moras RN BSN [place] Pager [phone]

## 2017-10-31 NOTE — Progress Notes (Signed)
Pharmacy Antibiotic Note  Bryan Brewer is a 77 y.o. male admitted on 10/29/2017 with UTI.  Pharmacy has been consulted for ceftriaxone dosing.  Plan: Will start ceftriaxone 1g IV daily  Height: 5\' 7"  (170.2 cm) Weight: 163 lb 9.3 oz (74.2 kg) IBW/kg (Calculated) : 66.1  Temp (24hrs), Avg:97.9 F (36.6 C), Min:97.7 F (36.5 C), Max:98.1 F (36.7 C)  Recent Labs  Lab 10/29/17 2319 10/30/17 0308 10/30/17 1904  WBC 11.6* 9.7 7.0  CREATININE 3.15* 3.02*  --     Estimated Creatinine Clearance: 19.5 mL/min (A) (by C-G formula based on SCr of 3.02 mg/dL (H)).    No Known Allergies   Thank you for allowing pharmacy to be a part of this patient's care.  Tobie Lords, PharmD, BCPS Clinical Pharmacist 10/31/2017

## 2017-10-31 NOTE — Progress Notes (Signed)
Subjective:  Denies any chest pain no worsening shortness of breath feels reasonably well ready for transfer  Objective:  Vital Signs in the last 24 hours: Temp:  [97.7 F (36.5 C)-98.1 F (36.7 C)] 97.7 F (36.5 C) (03/28 1600) Pulse Rate:  [65-81] 70 (03/28 1600) Resp:  [12-26] 26 (03/28 1600) BP: (133-151)/(65-121) 146/71 (03/28 1600) SpO2:  [91 %-100 %] 91 % (03/28 1600) Weight:  [163 lb (73.9 kg)-163 lb 9.3 oz (74.2 kg)] 163 lb (73.9 kg) (03/28 1317)  Intake/Output from previous day: 03/27 0701 - 03/28 0700 In: -  Out: 1540 [Urine:1540] Intake/Output from this shift: Total I/O In: 302.8 [I.V.:302.8] Out: 1300 [Urine:1300]  Physical Exam: General appearance: appears stated age Neck: no adenopathy, no carotid bruit, no JVD, supple, symmetrical, trachea midline and thyroid not enlarged, symmetric, no tenderness/mass/nodules Lungs: clear to auscultation bilaterally Heart: regular rate and rhythm and S3 present Abdomen: soft, non-tender; bowel sounds normal; no masses,  no organomegaly Extremities: edema 2+ edema lower extremity Pulses: 2+ and symmetric Skin: Skin color, texture, turgor normal. No rashes or lesions Neurologic: Alert and oriented X 3, normal strength and tone. Normal symmetric reflexes. Normal coordination and gait  Lab Results: Recent Labs    10/30/17 1904 10/31/17 0548  WBC 7.0 7.9  HGB 9.2* 9.2*  PLT 417 478*   Recent Labs    10/30/17 0308 10/31/17 0548 10/31/17 1125  NA 134* 139  --   K 4.6 5.4* 4.9  CL 106 112*  --   CO2 18* 18*  --   GLUCOSE 142* 121*  --   BUN 56* 58*  --   CREATININE 3.02* 2.66*  --    Recent Labs    10/30/17 0803 10/30/17 1411  TROPONINI 1.82* 2.34*   Hepatic Function Panel Recent Labs    10/31/17 0548  ALBUMIN 2.9*   No results for input(s): CHOL in the last 72 hours. No results for input(s): PROTIME in the last 72 hours.  Imaging: Imaging results have been reviewed  Cardiac  Studies:  Assessment/Plan: Multivessel coronary disease including left main Non-STEMI Abnormal EKG Acute on chronic renal insufficiency Anemia Angina Cardiomyopathy Chest Pain CHF Coronary Artery Disease Edema Shortness of Breath  . Plan Recommend transfer to tertiary care center for CABG High risk bypass surgery because of comorbid disease Continue nephrology input for renal insufficiency Status post cardiac cath with mild right groin hematoma Restart IV heparin 6-8 hours post cardiac cath Continue diuretic therapy for heart failure symptoms Consider imdur hydralazine for cardiomyopathy and heart failure Recommend avoid ARB and ACE inhibitors because of renal insufficiency Case discussed with patient and family awaiting transfer to tertiary care center   LOS: 1 day    Bryan Brewer 10/31/2017, 5:01 PM

## 2017-10-31 NOTE — Progress Notes (Addendum)
ANTICOAGULATION CONSULT NOTE   Pharmacy Consult for heparin Indication: chest pain/ACS  No Known Allergies  Patient Measurements: Height: 5\' 7"  (170.2 cm) Weight: 163 lb 9.3 oz (74.2 kg) IBW/kg (Calculated) : 66.1 Heparin Dosing Weight: 75.8 kg  Vital Signs: BP: 139/71 (03/27 2200) Pulse Rate: 81 (03/27 2200)  Labs: Recent Labs    10/29/17 2319 10/30/17 0308 10/30/17 0803 10/30/17 1015 10/30/17 1411 10/30/17 1904 10/30/17 2100 10/31/17 0548  HGB 8.9* 7.6*  --   --   --  9.2*  --  9.2*  HCT 28.4* 24.1*  --   --   --  27.6*  --  29.1*  PLT 530* 416  --   --   --  417  --  478*  APTT  --  70*  --   --   --   --   --   --   LABPROT  --  15.3*  --   --   --   --   --   --   INR  --  1.22  --   --   --   --   --   --   HEPARINUNFRC  --   --   --  0.10*  --   --  0.38 0.30  CREATININE 3.15* 3.02*  --   --   --   --   --  2.66*  TROPONINI 0.05* 0.67* 1.82*  --  2.34*  --   --   --     Estimated Creatinine Clearance: 22.1 mL/min (A) (by C-G formula based on SCr of 2.66 mg/dL (H)).   Medical History: Past Medical History:  Diagnosis Date  . Bladder cancer ([place])   . COPD (chronic obstructive pulmonary disease) ([place])   . Hyperlipidemia   . Hypertension   . Kidney disease   . Type 2 diabetes mellitus ([facility])     Medications:  Scheduled:  . Chlorhexidine Gluconate Cloth  6 each Topical Q0600  . famotidine  20 mg Oral Daily  . insulin aspart  0-9 Units Subcutaneous Q4H  . metoprolol tartrate  50 mg Oral BID  . mupirocin ointment  1 application Nasal BID  . simvastatin  20 mg Oral QHS  . sodium bicarbonate  1,300 mg Oral BID    Assessment: Patient admitted for CP was initially called a code STEMI, but then cancelled. Patient does have positive trop of 0.05. No PTA anticoagulation. Patient is being started on a heparin drip.  Goal of Therapy:  Heparin level 0.3-0.7 units/ml Monitor platelets by anticoagulation protocol: Yes   Plan:  3/28: HL @ 0548 = 0.3. Will  continue pt on current drip rate of 1150 units/hr. 2 Consecutive therapeutic levels, will recheck HL tomorrow on 3/29 with AM labs.   Candelaria Stagers, PharmD Pharmacy Resident  10/31/2017

## 2017-10-31 NOTE — Progress Notes (Addendum)
1520 returned from Cath. Lab.  Right groin site clean and dry. PAD on place with 45 cc of air in bulb Right pedal pulse present. Right foot cool to touch. Left urostomy wafer and bag changed. Urostomy bag connected to foley bag. Patient emotional but in good spirits.

## 2017-10-31 NOTE — Progress Notes (Signed)
Name: Bryan Brewer MRN: [id number] DOB: 09-13-1940    ADMISSION DATE:  10/29/2017  HISTORY OF PRESENT ILLNESS:   This is a 77 yo male with a PMH of Type II DM, HTN, Hyperlipidemia, COPD, Bladder Cancer (dx 2008 s/p chemotherapy, radiation, and cystoprostatectomy with ileal conduit), and CKD Stage IV (followed by [facility] baseline creatinine 2.2-2.5 mg/dL).  He presented to [facility] ER 03/26 with 1 hour of non-radiating substernal chest pressure/pain, nausea/vomiting,  and shortness of breath. Per ER notes symptoms worse with exertion.  He has had complaints of palpitations, tachycardia, dyspnea on exertion, angina symptoms, and intermittent nausea/vomiting requiring ER visits in January 2019 and March 2019.  He is currently being followed by Dr. Clayborn Bigness in the outpatient setting his most recent office visit was 10/16/2017 recommendations were NM myocardial perfusion SPECT multiple (stress and rest), Event Loop Monitor, and Echo Complete with follow-up 11/16/2017.  Cardiac Cath deferred at that time due to CKD.  During current ER visit EKG concerning for aVR ST elevation with diffuse ST depression, therefore code STEMI initiated, pt received sublingual nitroglycerin x2 resolving chest pain. ER physician contacted on call Cardiologist Dr. Saunders Revel after reviewing EKG's pt did not meet STEMI criteria. Therefore, due to resolution of chest pain and EKG findings Dr. Saunders Revel recommended heparin gtt for now with possible cardiac catheterization on 10/30/2017. He was subsequently admitted to ICU by hospitalist team for further treatment and workup.   SUBJECTIVE:  No complaints at this time   VITAL SIGNS: Temp:  [97.7 F (36.5 C)-98.1 F (36.7 C)] 97.7 F (36.5 C) (03/28 1600) Pulse Rate:  [65-81] 70 (03/28 1600) Resp:  [12-26] 26 (03/28 1600) BP: (133-151)/(65-121) 146/71 (03/28 1600) SpO2:  [91 %-100 %] 91 % (03/28 1600) Weight:  [163 lb (73.9 kg)-163 lb 9.3 oz (74.2 kg)] 163 lb (73.9 kg) (03/28  1317)  PHYSICAL EXAMINATION: General: well developed, well nourished male resting in bed, NAD  Neuro: alert and oriented, follows commands HEENT: supple, no JVD Cardiovascular: nsr, rrr, no R/G Lungs: clear throughout, even, non labored  Abdomen: LLQ urostomy present draining yellow urine, peristomal hernia soft and reducible, +BS x4, non tender, soft  Musculoskeletal: trace bilateral lower extremity edema Skin: no rashes or lesions   Recent Labs  Lab 10/29/17 2319 10/30/17 0308 10/31/17 0548 10/31/17 1125  NA 132* 134* 139  --   K 4.4 4.6 5.4* 4.9  CL 104 106 112*  --   CO2 15* 18* 18*  --   BUN 53* 56* 58*  --   CREATININE 3.15* 3.02* 2.66*  --   GLUCOSE 247* 142* 121*  --    Recent Labs  Lab 10/30/17 0308 10/30/17 1904 10/31/17 0548  HGB 7.6* 9.2* 9.2*  HCT 24.1* 27.6* 29.1*  WBC 9.7 7.0 7.9  PLT 416 417 478*   Dg Chest 2 View  Result Date: 10/29/2017 CLINICAL DATA:  77 year old male with chest pain. EXAM: CHEST - 2 VIEW COMPARISON:  Chest radiograph dated 10/09/2017 FINDINGS: Chronic interstitial coarsening and mild bronchitic changes. There is no focal consolidation, pleural effusion, or pneumothorax. The cardiac silhouette is within normal limits. No acute osseous pathology. IMPRESSION: No active cardiopulmonary disease. Electronically Signed   By: Anner Crete M.D.   On: 10/29/2017 23:37    ASSESSMENT / PLAN: Unstable Angina secondary to NSTEMI. Patient S/P cardiac cath found to have significant  left main disease Acute on chronic renal failure (baseline creatinine 2.2-2.5 mg/dL) Chronic Anemia  Type II DM  Hx:  Hyperlipidemia, HTN, COPD, and Bladder Cancer P: Plans for transfer to ?[facility] for CABG Transfer to 2 A as per Dr. Clayborn Bigness Nephrology follow up Target Glucose management Transfuse for Hg< 8  Family: wife updated.  Disp: transfer to 2 A as per Dr. Asher Muir, MD Pulmonary/Critical Care Pager [phone] (please enter 7  digits) [place] Pager [phone] (please enter 7 digits)

## 2017-10-31 NOTE — Consult Note (Signed)
[facility] CONSULT NOTE    Date: 10/31/2017                  Patient Name:  Bryan Brewer  MRN: [id number]  DOB: 1940-09-08  Age / Sex: 77 y.o., male         PCP: Glendon Axe, MD                 Service Requesting Consult: Cardiology                 Reason for Consult: Prevention of contrast induced nephropathy            History of Present Illness: Patient is a 77 y.o. male with a PMHx of bladder cancer, COPD, hypertension, chronic kidney disease stage IV, diabetes mellitus type 2, who was admitted to [facility] on 10/29/2017 for evaluation of central chest pressure.  Patient is followed normally by Dr. Clayborn Bigness.  He was to have an upcoming elective cardiac catheterization.  He was initially given nitroglycerin and had improvement in chest pain.  He has known underlying chronic kidney disease stage IV.  His creatinine has fluctuated between 2.5-3.0.  Currently his creatinine is down to 2.6.  Patient followed by Dr. Rockwell Germany as an outpt.  He also has known congestive heart failure with an ejection fraction of 35-40%.  His troponin has been trending up and is currently 2.34.  Case was discussed with Dr. Clayborn Bigness and he will be performing cardiac catheterization later this afternoon.   Medications: Outpatient medications: Medications Prior to Admission  Medication Sig Dispense Refill Last Dose  . famotidine (PEPCID) 20 MG tablet Take 1 tablet (20 mg total) by mouth 2 (two) times daily. 60 tablet 0 unknown at unknown  . glimepiride (AMARYL) 1 MG tablet Take 1 mg by mouth daily.  5 unknown at unknown  . metoprolol tartrate (LOPRESSOR) 25 MG tablet Take 25 mg by mouth 2 (two) times daily.   unknown at unknown  . simvastatin (ZOCOR) 20 MG tablet Take 20 mg by mouth at bedtime.  3 unknown at unknown  . sodium bicarbonate 650 MG tablet Take 1,300 mg by mouth 2 (two) times daily.  6 unknown at unknown    Current medications: Current Facility-Administered Medications   Medication Dose Route Frequency Provider Last Rate Last Dose  . 0.9 %  sodium chloride infusion   Intravenous Continuous End, Christopher, MD 75 mL/hr at 10/30/17 0600    . 0.9 %  sodium chloride infusion   Intravenous Once Awilda Bill, NP      . 0.9 %  sodium chloride infusion  250 mL Intravenous PRN Corey Skains, MD      . Derrill Memo ON 11/01/2017] 0.9% sodium chloride infusion  3 mL/kg/hr Intravenous Continuous Corey Skains, MD       Followed by  . [START ON 11/01/2017] 0.9% sodium chloride infusion  1 mL/kg/hr Intravenous Continuous Corey Skains, MD      . acetaminophen (TYLENOL) tablet 650 mg  650 mg Oral Q6H PRN Lance Coon, MD       Or  . acetaminophen (TYLENOL) suppository 650 mg  650 mg Rectal Q6H PRN Lance Coon, MD      . Derrill Memo ON 11/01/2017] aspirin chewable tablet 81 mg  81 mg Oral Bradly Bienenstock, MD      . cefTRIAXone (ROCEPHIN) 1 g in sodium chloride 0.9 % 100 mL IVPB  1 g Intravenous Q24H Awilda Bill, NP      .  Chlorhexidine Gluconate Cloth 2 % PADS 6 each  6 each Topical Q0600 Awilda Bill, NP      . famotidine (PEPCID) tablet 20 mg  20 mg Oral Daily Lance Coon, MD   20 mg at 10/30/17 1032  . heparin ADULT infusion 100 units/mL (25000 units/266mL sodium chloride 0.45%)  1,150 Units/hr Intravenous Continuous Saundra Shelling, MD 11.5 mL/hr at 10/30/17 2002 1,150 Units/hr at 10/30/17 2002  . insulin aspart (novoLOG) injection 0-9 Units  0-9 Units Subcutaneous Q4H Lance Coon, MD   1 Units at 10/31/17 0031  . metoprolol tartrate (LOPRESSOR) tablet 50 mg  50 mg Oral BID End, Christopher, MD   50 mg at 10/30/17 2130  . mupirocin ointment (BACTROBAN) 2 % 1 application  1 application Nasal BID Awilda Bill, NP   1 application at 73/53/29 0912  . ondansetron (ZOFRAN) tablet 4 mg  4 mg Oral Q6H PRN Lance Coon, MD       Or  . ondansetron [employer]) injection 4 mg  4 mg Intravenous Q6H PRN Lance Coon, MD      . simvastatin (ZOCOR) tablet  20 mg  20 mg Oral Corwin Levins, MD   20 mg at 10/30/17 2131  . sodium bicarbonate tablet 1,300 mg  1,300 mg Oral BID Lance Coon, MD   1,300 mg at 10/30/17 2130  . sodium chloride flush (NS) 0.9 % injection 3 mL  3 mL Intravenous Q12H Corey Skains, MD      . sodium chloride flush (NS) 0.9 % injection 3 mL  3 mL Intravenous PRN Corey Skains, MD          Allergies: No Known Allergies    Past Medical History: Past Medical History:  Diagnosis Date  . Bladder cancer ([place])   . COPD (chronic obstructive pulmonary disease) ([place])   . Hyperlipidemia   . Hypertension   . Kidney disease   . Type 2 diabetes mellitus ([place])      Past Surgical History: Past Surgical History:  Procedure Laterality Date  . REVISION UROSTOMY CUTANEOUS       Family History: Family History  Problem Relation Age of Onset  . CAD Mother   . Stroke Mother   . CAD Father   . Stroke Father   . Breast cancer Sister      Social History: Social History   Socioeconomic History  . Marital status: Married    Spouse name: Not on file  . Number of children: Not on file  . Years of education: Not on file  . Highest education level: Not on file  Occupational History  . Not on file  Social Needs  . Financial resource strain: Not on file  . Food insecurity:    Worry: Not on file    Inability: Not on file  . Transportation needs:    Medical: Not on file    Non-medical: Not on file  Tobacco Use  . Smoking status: Former Smoker    Types: Cigarettes    Last attempt to quit: 2013    Years since quitting: 6.2  . Smokeless tobacco: Never Used  Substance and Sexual Activity  . Alcohol use: No    Frequency: Never  . Drug use: Never  . Sexual activity: Not on file  Lifestyle  . Physical activity:    Days per week: Not on file    Minutes per session: Not on file  . Stress: Not on file  Relationships  . Social connections:  Talks on phone: Not on file    Gets together: Not on file     Attends religious service: Not on file    Active member of club or organization: Not on file    Attends meetings of clubs or organizations: Not on file    Relationship status: Not on file  . Intimate partner violence:    Fear of current or ex partner: Not on file    Emotionally abused: Not on file    Physically abused: Not on file    Forced sexual activity: Not on file  Other Topics Concern  . Not on file  Social History Narrative  . Not on file     Review of Systems: Review of Systems  Constitutional: Negative for chills and fever.  HENT: Negative for congestion, hearing loss, nosebleeds and tinnitus.   Eyes: Negative for blurred vision and double vision.  Respiratory: Negative for cough and hemoptysis.   Cardiovascular: Positive for chest pain and PND. Negative for leg swelling.  Gastrointestinal: Negative for abdominal pain, heartburn, nausea and vomiting.  Genitourinary: Negative for dysuria, frequency and urgency.  Musculoskeletal: Negative for falls and myalgias.  Skin: Negative for itching and rash.  Neurological: Negative for dizziness and weakness.  Endo/Heme/Allergies: Negative for polydipsia. Does not bruise/bleed easily.  Psychiatric/Behavioral: Negative for depression and hallucinations.     Vital Signs: Blood pressure 139/71, pulse 81, temperature 98.1 F (36.7 C), temperature source Oral, resp. rate 20, height 5\' 7"  (1.702 m), weight 74.2 kg (163 lb 9.3 oz), SpO2 91 %.  Weight trends: Filed Weights   10/29/17 2318 10/29/17 2319 10/30/17 0200  Weight: 75.8 kg (167 lb) 75.8 kg (167 lb) 74.2 kg (163 lb 9.3 oz)    Physical Exam: General: NAD, resting in bed  Head: Normocephalic, atraumatic.  Eyes: Anicteric, EOMI  Nose: Mucous membranes moist, not inflammed, nonerythematous.  Throat: Oropharynx nonerythematous, no exudate appreciated.   Neck: Supple, trachea midline.  Lungs:  Normal respiratory effort. Clear to auscultation BL without crackles or wheezes.   Heart: RRR. S1 and S2 normal without gallop, murmur, or rubs.  Abdomen:  BS normoactive. Soft, Nondistended, non-tender.  No masses or organomegaly.  Extremities: No pretibial edema.  Neurologic: A&O X3, Motor strength is 5/5 in the all 4 extremities  Skin: No visible rashes, scars.    Lab results: Basic Metabolic Panel: Recent Labs  Lab 10/29/17 2319 10/30/17 0308 10/31/17 0548  NA 132* 134* 139  K 4.4 4.6 5.4*  CL 104 106 112*  CO2 15* 18* 18*  GLUCOSE 247* 142* 121*  BUN 53* 56* 58*  CREATININE 3.15* 3.02* 2.66*  CALCIUM 8.7* 8.4* 8.3*  PHOS  --   --  3.5    Liver Function Tests: Recent Labs  Lab 10/31/17 0548  ALBUMIN 2.9*   No results for input(s): LIPASE, AMYLASE in the last 168 hours. No results for input(s): AMMONIA in the last 168 hours.  CBC: Recent Labs  Lab 10/29/17 2319 10/30/17 0308 10/30/17 1904 10/31/17 0548  WBC 11.6* 9.7 7.0 7.9  HGB 8.9* 7.6* 9.2* 9.2*  HCT 28.4* 24.1* 27.6* 29.1*  MCV 69.7* 69.1* 70.8* 72.1*  PLT 530* 416 417 478*    Cardiac Enzymes: Recent Labs  Lab 10/29/17 2319 10/30/17 0308 10/30/17 0803 10/30/17 1411  TROPONINI 0.05* 0.67* 1.82* 2.34*    BNP: Invalid input(s): POCBNP  CBG: Recent Labs  Lab 10/30/17 1216 10/30/17 1712 10/30/17 1917 10/31/17 0024 10/31/17 0408  GLUCAP 90 136* 145* 125* 109*  Microbiology: Results for orders placed or performed during the hospital encounter of 10/29/17  MRSA PCR Screening     Status: Abnormal   Collection Time: 10/30/17  2:11 AM  Result Value Ref Range Status   MRSA by PCR POSITIVE (A) NEGATIVE Final    Comment:        The GeneXpert MRSA Assay (FDA approved for NASAL specimens only), is one component of a comprehensive MRSA colonization surveillance program. It is not intended to diagnose MRSA infection nor to guide or monitor treatment for MRSA infections. RESULT CALLED TO, READ BACK BY AND VERIFIED WITH: BARBARA THAO AT 0335 ON 10/30/17 [place]. Performed  at [facility], [address] [place], [place] [address]     Coagulation Studies: Recent Labs    10/30/17 0308  LABPROT 15.3*  INR 1.22    Urinalysis: Recent Labs    10/31/17 0403  COLORURINE YELLOW*  LABSPEC 1.008  PHURINE 7.0  GLUCOSEU NEGATIVE  HGBUR SMALL*  BILIRUBINUR NEGATIVE  KETONESUR NEGATIVE  PROTEINUR NEGATIVE  NITRITE NEGATIVE  LEUKOCYTESUR LARGE*      Imaging: Dg Chest 2 View  Result Date: 10/29/2017 CLINICAL DATA:  77 year old male with chest pain. EXAM: CHEST - 2 VIEW COMPARISON:  Chest radiograph dated 10/09/2017 FINDINGS: Chronic interstitial coarsening and mild bronchitic changes. There is no focal consolidation, pleural effusion, or pneumothorax. The cardiac silhouette is within normal limits. No acute osseous pathology. IMPRESSION: No active cardiopulmonary disease. Electronically Signed   By: Anner Crete M.D.   On: 10/29/2017 23:37      Assessment & Plan: Pt is a 77 y.o. male with a PMHx of bladder cancer, COPD, hypertension, chronic kidney disease stage IV, diabetes mellitus type 2, who was admitted to [facility] on 10/29/2017 for evaluation of central chest pressure.   1.  Chronic kidney disease stage IV. Creatinine ranging from 2.5-3.0.  Followed by [facility] nephrology as an outpatient. 2.  Hyperkalemia. 3.  Acute myocardial infarction with need for cardiac catherization. 4.  Anemia of CKD. 5.  Secondary hyperparathyroidism.   Plan:  We are asked to see the patient for prevention of contrast-induced nephropathy.  The patient is high risk for contrast-induced nephropathy given underlying chronic kidney disease diabetes mellitus type 2.  Recommend starting the patient on 0.9 normal saline at 75 cc/h to be continued now until at least 12 hours after cardiac catheterization.  Intravenous sodium bicarbonate solution is no longer recommended for contrast-induced nephropathy.  Neither is acetylcysteine.  Given all this the patient still remains  high risk for contrast-induced nephropathy.  We counseled the patient that it could cause significant deterioration in his renal function to the point that he may require at least temporary dialysis.  Patient does wish to proceed with the procedure.  We will continue to monitor his progress very closely.  I would like to thank Dr. Clayborn Bigness for the consultation.

## 2017-11-01 LAB — BASIC METABOLIC PANEL
ANION GAP: 8 (ref 5–15)
BUN: 56 mg/dL — AB (ref 6–20)
CO2: 17 mmol/L — ABNORMAL LOW (ref 22–32)
Calcium: 8.5 mg/dL — ABNORMAL LOW (ref 8.9–10.3)
Chloride: 115 mmol/L — ABNORMAL HIGH (ref 101–111)
Creatinine, Ser: 2.38 mg/dL — ABNORMAL HIGH (ref 0.61–1.24)
GFR calc Af Amer: 29 mL/min — ABNORMAL LOW (ref >60)
GFR, EST NON AFRICAN AMERICAN: 25 mL/min — AB (ref >60)
GLUCOSE: 146 mg/dL — AB (ref 65–99)
POTASSIUM: 4.8 mmol/L (ref 3.5–5.1)
Sodium: 140 mmol/L (ref 135–145)

## 2017-11-01 LAB — GLUCOSE, CAPILLARY
GLUCOSE-CAPILLARY: 82 mg/dL (ref 65–99)
Glucose-Capillary: 116 mg/dL — ABNORMAL HIGH (ref 65–99)
Glucose-Capillary: 217 mg/dL — ABNORMAL HIGH (ref 65–99)

## 2017-11-01 LAB — CBC
HCT: 30.2 % — ABNORMAL LOW (ref 40.0–52.0)
HEMOGLOBIN: 9.3 g/dL — AB (ref 13.0–18.0)
MCH: 22.4 pg — ABNORMAL LOW (ref 26.0–34.0)
MCHC: 30.9 g/dL — AB (ref 32.0–36.0)
MCV: 72.6 fL — AB (ref 80.0–100.0)
Platelets: 480 10*3/uL — ABNORMAL HIGH (ref 150–440)
RBC: 4.16 MIL/uL — ABNORMAL LOW (ref 4.40–5.90)
RDW: 20.3 % — AB (ref 11.5–14.5)
WBC: 8.1 10*3/uL (ref 3.8–10.6)

## 2017-11-01 LAB — URINE CULTURE

## 2017-11-01 MED ORDER — HEPARIN (PORCINE) IN NACL 100-0.45 UNIT/ML-% IJ SOLN
1150.0000 [IU]/h | INTRAMUSCULAR | Status: DC
Start: 2017-11-01 — End: 2017-11-01
  Administered 2017-11-01: 1150 [IU]/h via INTRAVENOUS
  Filled 2017-11-01: qty 250

## 2017-11-01 MED ORDER — INSULIN ASPART 100 UNIT/ML ~~LOC~~ SOLN
0.0000 [IU] | Freq: Three times a day (TID) | SUBCUTANEOUS | Status: DC
  Administered 2017-11-01: 3 [IU] via SUBCUTANEOUS
  Filled 2017-11-01: qty 1

## 2017-11-01 NOTE — Progress Notes (Signed)
Per [facility] transfer center, [facility]" will pick up patient at 1830.

## 2017-11-01 NOTE — Progress Notes (Signed)
Received call from [facility] that they have a bed available, pt will be going to [address] [employer]. Dr. Estanislado Pandy and Dr. Clayborn Bigness notified.

## 2017-11-01 NOTE — Progress Notes (Signed)
[place] at [facility]                                                                                                                                                                                  Patient Demographics   Bryan Brewer, is a 77 y.o. male, DOB - 1940-11-18, GUR:[id number]  Admit date - 10/29/2017   Admitting Physician Lance Coon, MD  Outpatient Primary MD for the patient is Glendon Axe, MD   LOS - 2  Subjective: Patient seen and evaluated by me today Feels better No complaints of any chest pain  no shortness of breath  No palpitations   Review of Systems:   CONSTITUTIONAL: No documented fever. No fatigue, weakness. No weight gain, no weight loss.  EYES: No blurry or double vision.  ENT: No tinnitus. No postnasal drip. No redness of the oropharynx.  RESPIRATORY: No cough, no wheeze, no hemoptysis. No dyspnea.  CARDIOVASCULAR: No chest pain. No orthopnea. No palpitations. No syncope.  GASTROINTESTINAL: No nausea, no vomiting or diarrhea. No abdominal pain. No melena or hematochezia.  GENITOURINARY: No dysuria or hematuria.  ENDOCRINE: No polyuria or nocturia. No heat or cold intolerance.  HEMATOLOGY: No anemia. No bruising. No bleeding.  INTEGUMENTARY: No rashes. No lesions.  MUSCULOSKELETAL: No arthritis. No swelling. No gout.  NEUROLOGIC: No numbness, tingling, or ataxia. No seizure-type activity.  PSYCHIATRIC: No anxiety. No insomnia. No ADD.    Vitals:   Vitals:   10/31/17 2030 11/01/17 0426 11/01/17 0507 11/01/17 0756  BP: (!) 152/74 (!) 150/83  (!) 157/83  Pulse: 70 71  68  Resp: 19 17  18   Temp: 97.8 F (36.6 C) 98.5 F (36.9 C)  98.2 F (36.8 C)  TempSrc: Oral Oral  Oral  SpO2: 99% 97%  99%  Weight:   72.5 kg (159 lb 14.4 oz)   Height:        Wt Readings from Last 3 Encounters:  11/01/17 72.5 kg (159 lb 14.4 oz)  10/09/17 75.8 kg (167 lb)  08/12/17 77.1 kg (170 lb)     Intake/Output Summary (Last 24  hours) at 11/01/2017 1327 Last data filed at 11/01/2017 1219 Gross per 24 hour  Intake 100 ml  Output 800 ml  Net -700 ml    Physical Exam:   GENERAL: Pleasant-appearing in no apparent distress.  HEAD, EYES, EARS, NOSE AND THROAT: Atraumatic, normocephalic. Extraocular muscles are intact. Pupils equal and reactive to light. Sclerae anicteric. No conjunctival injection. No oro-pharyngeal erythema.  NECK: Supple. There is no jugular venous distention. No bruits, no lymphadenopathy, no thyromegaly.  HEART: Regular rate and rhythm,. No murmurs, no rubs, no clicks.  LUNGS:  Clear to auscultation bilaterally. No rales or rhonchi. No wheezes.  ABDOMEN: Soft, flat, nontender, nondistended. Has good bowel sounds. No hepatosplenomegaly appreciated Peristomal hernia.  EXTREMITIES: No evidence of any cyanosis, clubbing, or peripheral edema.  +2 pedal and radial pulses bilaterally.  NEUROLOGIC: The patient is alert, awake, and oriented x3 with no focal motor or sensory deficits appreciated bilaterally.  SKIN: Moist and warm with no rashes appreciated.  Psych: Not anxious, depressed LN: No inguinal LN enlargement    Antibiotics   Anti-infectives (From admission, onward)   Start     Dose/Rate Route Frequency Ordered Stop   11/01/17 1000  cefTRIAXone (ROCEPHIN) 1 g in sodium chloride 0.9 % 100 mL IVPB     1 g 200 mL/hr over 30 Minutes Intravenous Every 24 hours 10/31/17 1558     10/31/17 0500  cefTRIAXone (ROCEPHIN) 1 g in sodium chloride 0.9 % 100 mL IVPB  Status:  Discontinued     1 g 200 mL/hr over 30 Minutes Intravenous Every 24 hours 10/31/17 0444 10/31/17 1558      Medications   Scheduled Meds: . aspirin  81 mg Oral Daily  . Chlorhexidine Gluconate Cloth  6 each Topical Q0600  . famotidine  20 mg Oral Daily  . insulin aspart  0-9 Units Subcutaneous TID WC  . metoprolol tartrate  50 mg Oral BID  . mupirocin ointment  1 application Nasal BID  . simvastatin  20 mg Oral QHS  . sodium  bicarbonate  1,300 mg Oral BID  . sodium chloride flush  3 mL Intravenous Q12H   Continuous Infusions: . sodium chloride 75 mL/hr at 10/30/17 0600  . sodium chloride    . sodium chloride    . cefTRIAXone (ROCEPHIN) IVPB 1 gram/100 mL NS (Mini-Bag Plus) Stopped (11/01/17 1219)  . heparin 1,150 Units/hr (11/01/17 0932)   PRN Meds:.sodium chloride, acetaminophen **OR** acetaminophen, ondansetron **OR** ondansetron (ZOFRAN) IV, sodium chloride flush   Data Review:   Micro Results Recent Results (from the past 240 hour(s))  MRSA PCR Screening     Status: Abnormal   Collection Time: 10/30/17  2:11 AM  Result Value Ref Range Status   MRSA by PCR POSITIVE (A) NEGATIVE Final    Comment:        The GeneXpert MRSA Assay (FDA approved for NASAL specimens only), is one component of a comprehensive MRSA colonization surveillance program. It is not intended to diagnose MRSA infection nor to guide or monitor treatment for MRSA infections. RESULT CALLED TO, READ BACK BY AND VERIFIED WITH: BARBARA THAO AT 0335 ON 10/30/17 [place]. Performed at [facility], [address], [place], [place] [address]   Urine Culture     Status: Abnormal   Collection Time: 10/31/17  4:03 AM  Result Value Ref Range Status   Specimen Description   Final    URINE, RANDOM Performed at [facility], [address], [place], [place] [address]    Special Requests   Final    NONE Performed at [facility], [address] [place], [place] [address]    Culture MULTIPLE SPECIES PRESENT, SUGGEST RECOLLECTION (A)  Final   Report Status 11/01/2017 FINAL  Final    Radiology Reports Dg Chest 2 View  Result Date: 10/29/2017 CLINICAL DATA:  77 year old male with chest pain. EXAM: CHEST - 2 VIEW COMPARISON:  Chest radiograph dated 10/09/2017 FINDINGS: Chronic interstitial coarsening and mild bronchitic changes. There is no focal consolidation, pleural effusion, or pneumothorax. The cardiac  silhouette  is within normal limits. No acute osseous pathology. IMPRESSION: No active cardiopulmonary disease. Electronically Signed   By: Anner Crete M.D.   On: 10/29/2017 23:37   Dg Chest 2 View  Result Date: 10/09/2017 CLINICAL DATA:  Chest pain. EXAM: CHEST - 2 VIEW COMPARISON:  08/12/2017 FINDINGS: Heart size and pulmonary vascularity are normal. Chronic accentuation of the interstitial markings with chronic peribronchial thickening. No acute infiltrates or effusions. No acute bone abnormality. Old compression deformity at the thoracolumbar junction. IMPRESSION: No acute abnormality.  Chronic bronchitic changes. Electronically Signed   By: Lorriane Shire M.D.   On: 10/09/2017 12:52     CBC Recent Labs  Lab 10/29/17 2319 10/30/17 0308 10/30/17 1904 10/31/17 0548 11/01/17 0559  WBC 11.6* 9.7 7.0 7.9 8.1  HGB 8.9* 7.6* 9.2* 9.2* 9.3*  HCT 28.4* 24.1* 27.6* 29.1* 30.2*  PLT 530* 416 417 478* 480*  MCV 69.7* 69.1* 70.8* 72.1* 72.6*  MCH 22.0* 21.7* 23.5* 22.8* 22.4*  MCHC 31.5* 31.4* 33.2 31.6* 30.9*  RDW 19.0* 19.1* 19.6* 19.4* 20.3*    Chemistries  Recent Labs  Lab 10/29/17 2319 10/30/17 0308 10/31/17 0548 10/31/17 1125 11/01/17 0559  NA 132* 134* 139  --  140  K 4.4 4.6 5.4* 4.9 4.8  CL 104 106 112*  --  115*  CO2 15* 18* 18*  --  17*  GLUCOSE 247* 142* 121*  --  146*  BUN 53* 56* 58*  --  56*  CREATININE 3.15* 3.02* 2.66*  --  2.38*  CALCIUM 8.7* 8.4* 8.3*  --  8.5*   ------------------------------------------------------------------------------------------------------------------ estimated creatinine clearance is 24.7 mL/min (A) (by C-G formula based on SCr of 2.38 mg/dL (H)). ------------------------------------------------------------------------------------------------------------------ No results for input(s): HGBA1C in the last 72 hours. ------------------------------------------------------------------------------------------------------------------ No  results for input(s): CHOL, HDL, LDLCALC, TRIG, CHOLHDL, LDLDIRECT in the last 72 hours. ------------------------------------------------------------------------------------------------------------------ No results for input(s): TSH, T4TOTAL, T3FREE, THYROIDAB in the last 72 hours.  Invalid input(s): FREET3 ------------------------------------------------------------------------------------------------------------------ No results for input(s): VITAMINB12, FOLATE, FERRITIN, TIBC, IRON, RETICCTPCT in the last 72 hours.  Coagulation profile Recent Labs  Lab 10/30/17 0308  INR 1.22    No results for input(s): DDIMER in the last 72 hours.  Cardiac Enzymes Recent Labs  Lab 10/30/17 0308 10/30/17 0803 10/30/17 1411  TROPONINI 0.67* 1.82* 2.34*   ------------------------------------------------------------------------------------------------------------------ Invalid input(s): POCBNP    Assessment & Plan  77 year old male patient with history of diabetes mellitus type 2, hypertension, emphysema, chronic kidney disease stage IV, hyperlipidemia was admitted to ICU for non-STEMI.  1.  Non-ST elevation MI  S/p Cardiac catheterization by cardiology Multivessel coronary artery disease including left main Continue IV heparin drip for anticoagulation.  Continue beta-blocker and statin Patient transferred to telemetry floor yesterday from ICU Continue Aspirin and betablocker Awaiting placement at [facility] center for CABG as recommended by cardiology after cardiac cath  2.  Type 2 diabetes mellitus Sliding scale coverage for diabetes mellitus  n.p.o. for procedure  3.  Emphysema Stable Continue home dose inhalation treatments  4.  Chronic kidney disease stage IV .  Monitor renal function  nephrology follow-up appreciated  5.  Hyperlipidemia Continue statin medication  6. GI prophylaxis with PEPCID  7. Disposition, transfer to tertiary center [facility] for CABG once  bed is available Patient is on a waiting list.           Code Status Orders  (From admission, onward)        Start     Ordered  10/30/17 0204  Full code  Continuous     10/30/17 0203    Code Status History    This patient has a current code status but no historical code status.      Time Spent in minutes   34 minutes  Greater than 50% of time spent in care coordination and counseling patient regarding the condition and plan of care.   Saundra Shelling M.D on 11/01/2017 at 1:27 PM  Between 7am to 6pm - Pager - [phone]  After 6pm go to [email or web address] - Proofreader  [employer]   Office  [phone]

## 2017-11-01 NOTE — Consult Note (Signed)
[place] Nurse ostomy follow up Returned to patient's room with ostomy belt and assisted with sizing and application.  Patient states he feels more secure with the belt on. Extra pouches and supplies provided at bedside for ostomy care.  Patient and wife are grateful for assistance.  WOc team will follow.  Domenic Moras RN BSN [place] Pager [phone]

## 2017-11-01 NOTE — Progress Notes (Signed)
Patient accepted at [facility] for CABG DC summary done EMTALA done

## 2017-11-01 NOTE — Progress Notes (Addendum)
Bryan Brewer to be D/C'd to [facility] for CABG per MD order.   Pt agrees. Report given to [place] at [facility].  Allergies as of 11/01/2017   No Known Allergies     Medication List    STOP taking these medications   famotidine 20 MG tablet Commonly known as:  PEPCID   glimepiride 1 MG tablet Commonly known as:  AMARYL   metoprolol tartrate 25 MG tablet Commonly known as:  LOPRESSOR   simvastatin 20 MG tablet Commonly known as:  ZOCOR   sodium bicarbonate 650 MG tablet       Vitals:   11/01/17 0756 11/01/17 1647  BP: (!) 157/83 (!) 148/76  Pulse: 68 67  Resp: 18 18  Temp: 98.2 F (36.8 C) 97.8 F (36.6 C)  SpO2: 99% 100%    Tele box removed and returned. Skin clean, dry and intact without evidence of skin break down, no evidence of skin tears noted.  Pt denies pain at this time. No complaints noted.   Patient escorted via stretcher, and D/C to [facility] by "JAN care" Bryan Brewer

## 2017-11-01 NOTE — Progress Notes (Signed)
Heparin gtt restarted at 11.5 ml/hr per Dr. Clayborn Bigness. Awaiting bed at [facility]. Will continue to monitor.

## 2017-11-01 NOTE — Progress Notes (Signed)
[facility]  ROUNDING NOTE   Subjective:  Patient was found to have multivessel coronary artery disease including the left main coronary artery. Therefore it has been recommended that he be transferred for CABG. He has underlying chronic kidney disease which appears to be stable despite contrast administration yesterday.   Objective:  Vital signs in last 24 hours:  Temp:  [97.3 F (36.3 C)-98.5 F (36.9 C)] 98.2 F (36.8 C) (03/29 0756) Pulse Rate:  [65-79] 68 (03/29 0756) Resp:  [14-26] 18 (03/29 0756) BP: (128-161)/(71-121) 157/83 (03/29 0756) SpO2:  [91 %-100 %] 99 % (03/29 0756) Weight:  [72.5 kg (159 lb 14.4 oz)-74.2 kg (163 lb 9.3 oz)] 72.5 kg (159 lb 14.4 oz) (03/29 0507)  Weight change:  Filed Weights   10/31/17 1317 10/31/17 1848 11/01/17 0507  Weight: 73.9 kg (163 lb) 73.8 kg (162 lb 9.6 oz) 72.5 kg (159 lb 14.4 oz)    Intake/Output: I/O last 3 completed shifts: In: 302.8 [I.V.:302.8] Out: 2090 [Urine:2090]   Intake/Output this shift:  No intake/output data recorded.  Physical Exam: General: No acute distress  Head: Normocephalic, atraumatic. Moist oral mucosal membranes  Eyes: Anicteric  Neck: Supple, trachea midline  Lungs:  Clear to auscultation, normal effort  Heart: S1S2 no rubs  Abdomen:  Soft, nontender, bowel sounds present  Extremities: Trace peripheral edema.  Neurologic: Awake, alert, following commands  Skin: No lesions       Basic Metabolic Panel: Recent Labs  Lab 10/29/17 2319 10/30/17 0308 10/31/17 0548 10/31/17 1125 11/01/17 0559  NA 132* 134* 139  --  140  K 4.4 4.6 5.4* 4.9 4.8  CL 104 106 112*  --  115*  CO2 15* 18* 18*  --  17*  GLUCOSE 247* 142* 121*  --  146*  BUN 53* 56* 58*  --  56*  CREATININE 3.15* 3.02* 2.66*  --  2.38*  CALCIUM 8.7* 8.4* 8.3*  --  8.5*  PHOS  --   --  3.5  --   --     Liver Function Tests: Recent Labs  Lab 10/31/17 0548  ALBUMIN 2.9*   No results for input(s): LIPASE, AMYLASE  in the last 168 hours. No results for input(s): AMMONIA in the last 168 hours.  CBC: Recent Labs  Lab 10/29/17 2319 10/30/17 0308 10/30/17 1904 10/31/17 0548 11/01/17 0559  WBC 11.6* 9.7 7.0 7.9 8.1  HGB 8.9* 7.6* 9.2* 9.2* 9.3*  HCT 28.4* 24.1* 27.6* 29.1* 30.2*  MCV 69.7* 69.1* 70.8* 72.1* 72.6*  PLT 530* 416 417 478* 480*    Cardiac Enzymes: Recent Labs  Lab 10/29/17 2319 10/30/17 0308 10/30/17 0803 10/30/17 1411  TROPONINI 0.05* 0.67* 1.82* 2.34*    BNP: Invalid input(s): POCBNP  CBG: Recent Labs  Lab 10/31/17 1152 10/31/17 1319 10/31/17 1644 10/31/17 2032 11/01/17 0802  GLUCAP 131* 135* 102* 125* 116*    Microbiology: Results for orders placed or performed during the hospital encounter of 10/29/17  MRSA PCR Screening     Status: Abnormal   Collection Time: 10/30/17  2:11 AM  Result Value Ref Range Status   MRSA by PCR POSITIVE (A) NEGATIVE Final    Comment:        The GeneXpert MRSA Assay (FDA approved for NASAL specimens only), is one component of a comprehensive MRSA colonization surveillance program. It is not intended to diagnose MRSA infection nor to guide or monitor treatment for MRSA infections. RESULT CALLED TO, READ BACK BY AND VERIFIED WITH: BARBARA THAO  AT 0335 ON 10/30/17 [place]. Performed at [facility], [address] [place], [place] [address]   Urine Culture     Status: Abnormal   Collection Time: 10/31/17  4:03 AM  Result Value Ref Range Status   Specimen Description   Final    URINE, RANDOM Performed at [facility], [address] [place], [place] [address]    Special Requests   Final    NONE Performed at [facility], [address] [place], [place] [address]    Culture MULTIPLE SPECIES PRESENT, SUGGEST RECOLLECTION (A)  Final   Report Status 11/01/2017 FINAL  Final    Coagulation Studies: Recent Labs    10/30/17 0308  LABPROT 15.3*  INR 1.22    Urinalysis: Recent Labs     10/31/17 0403  COLORURINE YELLOW*  LABSPEC 1.008  PHURINE 7.0  GLUCOSEU NEGATIVE  HGBUR SMALL*  BILIRUBINUR NEGATIVE  KETONESUR NEGATIVE  PROTEINUR NEGATIVE  NITRITE NEGATIVE  LEUKOCYTESUR LARGE*      Imaging: No results found.   Medications:   . sodium chloride 75 mL/hr at 10/30/17 0600  . sodium chloride    . sodium chloride    . cefTRIAXone (ROCEPHIN) IVPB 1 gram/100 mL NS (Mini-Bag Plus) 1 g (11/01/17 1025)  . heparin 1,150 Units/hr (11/01/17 0932)   . aspirin  81 mg Oral Daily  . Chlorhexidine Gluconate Cloth  6 each Topical Q0600  . famotidine  20 mg Oral Daily  . insulin aspart  0-9 Units Subcutaneous TID WC  . metoprolol tartrate  50 mg Oral BID  . mupirocin ointment  1 application Nasal BID  . simvastatin  20 mg Oral QHS  . sodium bicarbonate  1,300 mg Oral BID  . sodium chloride flush  3 mL Intravenous Q12H   sodium chloride, acetaminophen **OR** acetaminophen, ondansetron **OR** ondansetron (ZOFRAN) IV, sodium chloride flush  Assessment/ Plan:  77 y.o. male with a PMHx of bladder cancer, COPD, hypertension, chronic kidney disease stage IV, diabetes mellitus type 2, who was admitted to [facility] on 10/29/2017 for evaluation of central chest pressure.   1.  Chronic kidney disease stage IV. Creatinine ranging from 2.3-2.5  Followed by [facility] nephrology as an outpatient. 2.  Hyperkalemia. 3.  Acute myocardial infarction/multivessel coronary artery disease s/p cath. 4.  Anemia of CKD. 5.  Secondary hyperparathyroidism.   Plan:  Despite contrast administration yesterday the patient's renal function has remained stable.  Continue 0.9 normal saline for now.  We will likely discontinue it tomorrow.  Patient to be transferred to tertiary care center for CABG.  Patient is at high risk for further deterioration of renal function particularly during a potential CABG procedure.  We will continue to monitor the patient's renal function while he remains here.    LOS:  2 Mahir Prabhakar 3/29/201911:23 AM

## 2017-11-01 NOTE — Progress Notes (Signed)
Spoke to [place] at [facility] transfer center there is still not bed available at this time.

## 2017-11-01 NOTE — Progress Notes (Signed)
Report called to [place] at [facility]. Katrina will call with ETA.

## 2017-11-01 NOTE — Consult Note (Signed)
ANTICOAGULATION CONSULT NOTE - Initial Consult  Pharmacy Consult for heparin drip Indication: post PCI- pt awaiting transfer to [facility] for CABG  No Known Allergies  Patient Measurements: Height: 5\' 7"  (170.2 cm) Weight: 159 lb 14.4 oz (72.5 kg) IBW/kg (Calculated) : 66.1 Heparin Dosing Weight: 73.8kg  Vital Signs: Temp: 98.2 F (36.8 C) (03/29 0756) Temp Source: Oral (03/29 0756) BP: 157/83 (03/29 0756) Pulse Rate: 68 (03/29 0756)  Labs: Recent Labs    10/30/17 0308 10/30/17 0803 10/30/17 1015 10/30/17 1411 10/30/17 1904 10/30/17 2100 10/31/17 0548 11/01/17 0559  HGB 7.6*  --   --   --  9.2*  --  9.2* 9.3*  HCT 24.1*  --   --   --  27.6*  --  29.1* 30.2*  PLT 416  --   --   --  417  --  478* 480*  APTT 70*  --   --   --   --   --   --   --   LABPROT 15.3*  --   --   --   --   --   --   --   INR 1.22  --   --   --   --   --   --   --   HEPARINUNFRC  --   --  0.10*  --   --  0.38 0.30  --   CREATININE 3.02*  --   --   --   --   --  2.66* 2.38*  TROPONINI 0.67* 1.82*  --  2.34*  --   --   --   --     Estimated Creatinine Clearance: 24.7 mL/min (A) (by C-G formula based on SCr of 2.38 mg/dL (H)).   Medical History: Past Medical History:  Diagnosis Date  . Bladder cancer ([place])   . COPD (chronic obstructive pulmonary disease) ([place])   . Hyperlipidemia   . Hypertension   . Kidney disease   . Type 2 diabetes mellitus (H[facility])     Medications:  Scheduled:  . aspirin  81 mg Oral Daily  . Chlorhexidine Gluconate Cloth  6 each Topical Q0600  . famotidine  20 mg Oral Daily  . insulin aspart  0-9 Units Subcutaneous TID WC  . metoprolol tartrate  50 mg Oral BID  . mupirocin ointment  1 application Nasal BID  . simvastatin  20 mg Oral QHS  . sodium bicarbonate  1,300 mg Oral BID  . sodium chloride flush  3 mL Intravenous Q12H    Assessment: Pt is a 77 year old male taken to the cath lab yesterday found to need CABG. Awaiting transfer to [facility]. Pharmacy consulted to  restart heparin drip. Last drip rate of 1150 units/hr resulted in therapeutic levels. Will resume drip w/ no bolus at same rate.  Goal of Therapy:  Heparin level 0.3-0.7 units/ml Monitor platelets by anticoagulation protocol: Yes   Plan:  Start heparin infusion at 1150 units/hr Check anti-Xa level in 8 hours and daily while on heparin Continue to monitor H&H and platelets  Aleksa Catterton D Link Burgeson, Pharm.D, BCPS Clinical Pharmacist  11/01/2017,9:31 AM

## 2017-11-01 NOTE — Consult Note (Signed)
WOC Nurse ostomy follow up Stoma type/location: patient room today.  Wife at bedside Stomal assessment/size: 1 1/8" Peristomal assessment: Large peristomal hernia present. Treatment options for stomal/peristomal skin: barrier ring.  Patient agrees to wear an ostomy belt.  He has worn one in the past, but currently does not. Encouraged to wear belt to prolong wear time and empty when 1/3 full.  Bag is currently 1/2 full and not connected to bedside drainage.  He states he will reconnect "soon"   Informed that these things will reduce wear time.  Output clear yellow urine Ostomy pouching: 2pc. With barrier ring Education provided: see above Enrolled patient in [employer] Discharge program: No Will not follow at this time.  Please re-consult if needed.  Domenic Moras RN BSN [place] Pager [phone]

## 2017-11-01 NOTE — Discharge Summary (Signed)
[place] at [facility], 77 y.o., DOB 08-18-1940, MRN [id number]. Admission date: 10/29/2017 Discharge Date 11/01/2017 Primary MD Glendon Axe, MD Admitting Physician Lance Coon, MD  Admission Diagnosis  NSTEMI (non-ST elevated myocardial infarction) [facility]) [I21.4]  Hypertension Diabetes mellitus type 2 Chronic kidney disease stage IV  Discharge Diagnosis   Principal Problem:   NSTEMI (non-ST elevated myocardial infarction) ([place]) Active Problems:   Diabetes ([place])   CKD (chronic kidney disease), stage IV (HCC)   HTN (hypertension)   HLD (hyperlipidemia)   COPD (chronic obstructive pulmonary disease) ([place])    CAD with multivessel disease and left main disease  Hospital Course  77 year old male patient with history of hypertension, diabetes mellitus type 2, chronic kidney disease stage IV admitted to our hospital on 10/29/2017 for chest pain, non-STEMI.  Patient admitted to ICU was started on IV nitroglycerin and heparin drips.  His troponin was elevated he was seen by nephrology and cardiology had cardiac cath which revealed multivessel disease with left main disease... Coronary artery bypass grafting was recommended.  Patient will be transferred to [facility] for CABG procedure..  Transfer plan discussed with family and patient was agreed with the plan of management.  Patient on heparin drip for anticoagulation.   Consults  Cardiology  Significant Tests:  See full reports for all details    Dg Chest 2 View  Result Date: 10/29/2017 CLINICAL DATA:  77 year old male with chest pain. EXAM: CHEST - 2 VIEW COMPARISON:  Chest radiograph dated 10/09/2017 FINDINGS: Chronic interstitial coarsening and mild bronchitic changes. There is no focal consolidation, pleural effusion, or pneumothorax. The cardiac silhouette is within normal limits. No acute osseous pathology. IMPRESSION: No active cardiopulmonary disease. Electronically Signed   By:  Anner Crete M.D.   On: 10/29/2017 23:37   Dg Chest 2 View  Result Date: 10/09/2017 CLINICAL DATA:  Chest pain. EXAM: CHEST - 2 VIEW COMPARISON:  08/12/2017 FINDINGS: Heart size and pulmonary vascularity are normal. Chronic accentuation of the interstitial markings with chronic peribronchial thickening. No acute infiltrates or effusions. No acute bone abnormality. Old compression deformity at the thoracolumbar junction. IMPRESSION: No acute abnormality.  Chronic bronchitic changes. Electronically Signed   By: Lorriane Shire M.D.   On: 10/09/2017 12:52       Today   Subjective:   Bryan Brewer  Seen today No chest pain No shortness of breath  Objective:   Blood pressure (!) 157/83, pulse 68, temperature 98.2 F (36.8 C), temperature source Oral, resp. rate 18, height 5\' 7"  (1.702 m), weight 72.5 kg (159 lb 14.4 oz), SpO2 99 %.  .  Intake/Output Summary (Last 24 hours) at 11/01/2017 1630 Last data filed at 11/01/2017 1219 Gross per 24 hour  Intake 416.87 ml  Output 550 ml  Net -133.13 ml    Exam VITAL SIGNS: Blood pressure (!) 157/83, pulse 68, temperature 98.2 F (36.8 C), temperature source Oral, resp. rate 18, height 5\' 7"  (1.702 m), weight 72.5 kg (159 lb 14.4 oz), SpO2 99 %.  GENERAL:  77 y.o.-year-old patient lying in the bed with no acute distress.  EYES: Pupils equal, round, reactive to light and accommodation. No scleral icterus. Extraocular muscles intact.  HEENT: Head atraumatic, normocephalic. Oropharynx and nasopharynx clear.  NECK:  Supple, no jugular venous distention. No thyroid enlargement, no tenderness.  LUNGS: Normal breath sounds bilaterally, no wheezing, rales,rhonchi or crepitation. No use of accessory muscles of respiration.  CARDIOVASCULAR: S1, S2 normal. No murmurs, rubs,  or gallops.  ABDOMEN: Soft, nontender, nondistended. Bowel sounds present. No organomegaly or mass.  EXTREMITIES: No pedal edema, cyanosis, or clubbing.  NEUROLOGIC: Cranial nerves  II through XII are intact. Muscle strength 5/5 in all extremities. Sensation intact. Gait not checked.  PSYCHIATRIC: The patient is alert and oriented x 3.  SKIN: No obvious rash, lesion, or ulcer.   Data Review     CBC w Diff:  Lab Results  Component Value Date   WBC 8.1 11/01/2017   HGB 9.3 (L) 11/01/2017   HGB 14.8 04/21/2012   HCT 30.2 (L) 11/01/2017   HCT 45.4 04/21/2012   PLT 480 (H) 11/01/2017   PLT 327 04/21/2012   LYMPHOPCT 11.8 04/21/2012   MONOPCT 7.3 04/21/2012   EOSPCT 1.4 04/21/2012   BASOPCT 1.3 04/21/2012   CMP:  Lab Results  Component Value Date   NA 140 11/01/2017   NA 139 04/21/2012   K 4.8 11/01/2017   K 4.8 04/21/2012   CL 115 (H) 11/01/2017   CL 105 04/21/2012   CO2 17 (L) 11/01/2017   CO2 28 04/21/2012   BUN 56 (H) 11/01/2017   BUN 15 04/21/2012   CREATININE 2.38 (H) 11/01/2017   CREATININE 1.27 04/21/2012   PROT 7.6 08/12/2017   PROT 7.4 04/21/2012   ALBUMIN 2.9 (L) 10/31/2017   ALBUMIN 3.4 04/21/2012   BILITOT 0.4 08/12/2017   BILITOT 0.4 04/21/2012   ALKPHOS 62 08/12/2017   ALKPHOS 79 04/21/2012   AST 16 08/12/2017   AST 14 (L) 04/21/2012   ALT 8 (L) 08/12/2017   ALT 16 04/21/2012  .  Micro Results Recent Results (from the past 240 hour(s))  MRSA PCR Screening     Status: Abnormal   Collection Time: 10/30/17  2:11 AM  Result Value Ref Range Status   MRSA by PCR POSITIVE (A) NEGATIVE Final    Comment:        The GeneXpert MRSA Assay (FDA approved for NASAL specimens only), is one component of a comprehensive MRSA colonization surveillance program. It is not intended to diagnose MRSA infection nor to guide or monitor treatment for MRSA infections. RESULT CALLED TO, READ BACK BY AND VERIFIED WITH: BARBARA THAO AT 0335 ON 10/30/17 [place]. Performed at [facility], [address], [place], [place] [address]   Urine Culture     Status: Abnormal   Collection Time: 10/31/17  4:03 AM  Result Value Ref Range Status    Specimen Description   Final    URINE, RANDOM Performed at [facility], [address] [place], [place] [address]    Special Requests   Final    NONE Performed at [facility], [address] [place], [place] [address]    Culture MULTIPLE SPECIES PRESENT, SUGGEST RECOLLECTION (A)  Final   Report Status 11/01/2017 FINAL  Final        Code Status Orders  (From admission, onward)        Start     Ordered   10/30/17 0204  Full code  Continuous     10/30/17 0203    Code Status History    This patient has a current code status but no historical code status.            Discharge Medications   Allergies as of 11/01/2017   No Known Allergies     Medication List    STOP taking these medications   famotidine 20 MG tablet Commonly known as:  PEPCID  glimepiride 1 MG tablet Commonly known as:  AMARYL   metoprolol tartrate 25 MG tablet Commonly known as:  LOPRESSOR   simvastatin 20 MG tablet Commonly known as:  ZOCOR   sodium bicarbonate 650 MG tablet          Total Time in preparing paper work, data evaluation and todays exam - 35 minutes  Saundra Shelling M.D on 11/01/2017 at 4:30 PM [place]  [phone]

## 2017-11-08 DIAGNOSIS — Z951 Presence of aortocoronary bypass graft: Secondary | ICD-10-CM | POA: Insufficient documentation

## 2018-08-02 DIAGNOSIS — M159 Polyosteoarthritis, unspecified: Secondary | ICD-10-CM | POA: Insufficient documentation

## 2018-11-20 DIAGNOSIS — I5022 Chronic systolic (congestive) heart failure: Secondary | ICD-10-CM | POA: Insufficient documentation

## 2019-01-06 DIAGNOSIS — M179 Osteoarthritis of knee, unspecified: Secondary | ICD-10-CM | POA: Insufficient documentation

## 2019-01-06 DIAGNOSIS — M1710 Unilateral primary osteoarthritis, unspecified knee: Secondary | ICD-10-CM | POA: Insufficient documentation

## 2019-02-14 ENCOUNTER — Encounter: Payer: Self-pay | Admitting: Emergency Medicine

## 2019-02-14 ENCOUNTER — Emergency Department: Payer: Medicare Other

## 2019-02-14 ENCOUNTER — Other Ambulatory Visit: Payer: Self-pay

## 2019-02-14 ENCOUNTER — Inpatient Hospital Stay
Admission: EM | Admit: 2019-02-14 | Discharge: 2019-02-16 | DRG: 684 | Disposition: A | Discharge status: Home / Self Care | Payer: Medicare Other | Attending: Internal Medicine | Admitting: Internal Medicine

## 2019-02-14 DIAGNOSIS — E11649 Type 2 diabetes mellitus with hypoglycemia without coma: Secondary | ICD-10-CM | POA: Diagnosis present

## 2019-02-14 DIAGNOSIS — Z7984 Long term (current) use of oral hypoglycemic drugs: Secondary | ICD-10-CM

## 2019-02-14 DIAGNOSIS — Z79899 Other long term (current) drug therapy: Secondary | ICD-10-CM | POA: Diagnosis not present

## 2019-02-14 DIAGNOSIS — R42 Dizziness and giddiness: Secondary | ICD-10-CM | POA: Diagnosis present

## 2019-02-14 DIAGNOSIS — Z8249 Family history of ischemic heart disease and other diseases of the circulatory system: Secondary | ICD-10-CM | POA: Diagnosis not present

## 2019-02-14 DIAGNOSIS — Z1159 Encounter for screening for other viral diseases: Secondary | ICD-10-CM

## 2019-02-14 DIAGNOSIS — Z823 Family history of stroke: Secondary | ICD-10-CM

## 2019-02-14 DIAGNOSIS — I2510 Atherosclerotic heart disease of native coronary artery without angina pectoris: Secondary | ICD-10-CM | POA: Diagnosis present

## 2019-02-14 DIAGNOSIS — E1122 Type 2 diabetes mellitus with diabetic chronic kidney disease: Secondary | ICD-10-CM | POA: Diagnosis present

## 2019-02-14 DIAGNOSIS — N289 Disorder of kidney and ureter, unspecified: Secondary | ICD-10-CM

## 2019-02-14 DIAGNOSIS — N183 Chronic kidney disease, stage 3 (moderate): Secondary | ICD-10-CM | POA: Diagnosis present

## 2019-02-14 DIAGNOSIS — I959 Hypotension, unspecified: Secondary | ICD-10-CM | POA: Diagnosis not present

## 2019-02-14 DIAGNOSIS — Z951 Presence of aortocoronary bypass graft: Secondary | ICD-10-CM | POA: Diagnosis not present

## 2019-02-14 DIAGNOSIS — E785 Hyperlipidemia, unspecified: Secondary | ICD-10-CM | POA: Diagnosis present

## 2019-02-14 DIAGNOSIS — N179 Acute kidney failure, unspecified: Secondary | ICD-10-CM | POA: Diagnosis not present

## 2019-02-14 DIAGNOSIS — I252 Old myocardial infarction: Secondary | ICD-10-CM | POA: Diagnosis not present

## 2019-02-14 DIAGNOSIS — N189 Chronic kidney disease, unspecified: Secondary | ICD-10-CM | POA: Diagnosis present

## 2019-02-14 DIAGNOSIS — I129 Hypertensive chronic kidney disease with stage 1 through stage 4 chronic kidney disease, or unspecified chronic kidney disease: Secondary | ICD-10-CM | POA: Diagnosis present

## 2019-02-14 DIAGNOSIS — R0602 Shortness of breath: Secondary | ICD-10-CM

## 2019-02-14 DIAGNOSIS — Z87891 Personal history of nicotine dependence: Secondary | ICD-10-CM | POA: Diagnosis not present

## 2019-02-14 DIAGNOSIS — Z803 Family history of malignant neoplasm of breast: Secondary | ICD-10-CM

## 2019-02-14 DIAGNOSIS — Z7982 Long term (current) use of aspirin: Secondary | ICD-10-CM | POA: Diagnosis not present

## 2019-02-14 DIAGNOSIS — E860 Dehydration: Secondary | ICD-10-CM | POA: Diagnosis present

## 2019-02-14 DIAGNOSIS — Z8551 Personal history of malignant neoplasm of bladder: Secondary | ICD-10-CM

## 2019-02-14 DIAGNOSIS — J449 Chronic obstructive pulmonary disease, unspecified: Secondary | ICD-10-CM | POA: Diagnosis present

## 2019-02-14 LAB — CBC
HCT: 32 % — ABNORMAL LOW (ref 39.0–52.0)
Hemoglobin: 10 g/dL — ABNORMAL LOW (ref 13.0–17.0)
MCH: 27.9 pg (ref 26.0–34.0)
MCHC: 31.3 g/dL (ref 30.0–36.0)
MCV: 89.4 fL (ref 80.0–100.0)
Platelets: 289 10*3/uL (ref 150–400)
RBC: 3.58 MIL/uL — ABNORMAL LOW (ref 4.22–5.81)
RDW: 14.7 % (ref 11.5–15.5)
WBC: 14.5 10*3/uL — ABNORMAL HIGH (ref 4.0–10.5)
nRBC: 0 % (ref 0.0–0.2)

## 2019-02-14 LAB — BASIC METABOLIC PANEL
Anion gap: 11 (ref 5–15)
BUN: 82 mg/dL — ABNORMAL HIGH (ref 8–23)
CO2: 15 mmol/L — ABNORMAL LOW (ref 22–32)
Calcium: 8.2 mg/dL — ABNORMAL LOW (ref 8.9–10.3)
Chloride: 109 mmol/L (ref 98–111)
Creatinine, Ser: 3.53 mg/dL — ABNORMAL HIGH (ref 0.61–1.24)
GFR calc Af Amer: 18 mL/min — ABNORMAL LOW (ref >60)
GFR calc non Af Amer: 16 mL/min — ABNORMAL LOW (ref >60)
Glucose, Bld: 191 mg/dL — ABNORMAL HIGH (ref 70–99)
Potassium: 4.1 mmol/L (ref 3.5–5.1)
Sodium: 135 mmol/L (ref 135–145)

## 2019-02-14 LAB — SARS CORONAVIRUS 2 BY RT PCR (HOSPITAL ORDER, PERFORMED IN ~~LOC~~ HOSPITAL LAB): SARS Coronavirus 2: NEGATIVE

## 2019-02-14 LAB — GLUCOSE, CAPILLARY: Glucose-Capillary: 255 mg/dL — ABNORMAL HIGH (ref 70–99)

## 2019-02-14 LAB — TROPONIN I (HIGH SENSITIVITY): Troponin I (High Sensitivity): 14 ng/L (ref <18)

## 2019-02-14 MED ORDER — INSULIN ASPART 100 UNIT/ML ~~LOC~~ SOLN
0.0000 [IU] | Freq: Every day | SUBCUTANEOUS | Status: DC
  Administered 2019-02-14: 23:00:00 3 [IU] via SUBCUTANEOUS
  Filled 2019-02-14: qty 1

## 2019-02-14 MED ORDER — SODIUM CHLORIDE 0.9% FLUSH: 3.0000 mL | Freq: Once | INTRAVENOUS | Status: DC

## 2019-02-14 MED ORDER — ISOSORBIDE MONONITRATE ER 30 MG PO TB24
30.0000 mg | ORAL_TABLET | Freq: Every day | ORAL | Status: DC
  Administered 2019-02-15: 11:00:00 30 mg via ORAL
  Filled 2019-02-14 (×2): qty 1

## 2019-02-14 MED ORDER — INSULIN ASPART 100 UNIT/ML ~~LOC~~ SOLN
0.0000 [IU] | Freq: Three times a day (TID) | SUBCUTANEOUS | Status: DC
  Administered 2019-02-15 (×2): 2 [IU] via SUBCUTANEOUS
  Filled 2019-02-14: qty 1

## 2019-02-14 MED ORDER — SODIUM BICARBONATE 650 MG PO TABS
1300.0000 mg | ORAL_TABLET | Freq: Three times a day (TID) | ORAL | Status: DC
  Administered 2019-02-15 – 2019-02-16 (×4): 1300 mg via ORAL
  Filled 2019-02-14 (×8): qty 2

## 2019-02-14 MED ORDER — METOPROLOL TARTRATE 50 MG PO TABS
50.0000 mg | ORAL_TABLET | Freq: Two times a day (BID) | ORAL | Status: DC
  Administered 2019-02-15 (×2): 50 mg via ORAL
  Filled 2019-02-14 (×4): qty 1

## 2019-02-14 MED ORDER — ONDANSETRON HCL 4 MG/2ML IJ SOLN: 4.0000 mg | Freq: Four times a day (QID) | INTRAMUSCULAR | Status: DC | PRN

## 2019-02-14 MED ORDER — GLIMEPIRIDE 2 MG PO TABS
2.0000 mg | ORAL_TABLET | Freq: Every day | ORAL | Status: DC
  Filled 2019-02-14 (×2): qty 1

## 2019-02-14 MED ORDER — HYDRALAZINE HCL 50 MG PO TABS
25.0000 mg | ORAL_TABLET | Freq: Three times a day (TID) | ORAL | Status: DC
  Administered 2019-02-15: 25 mg via ORAL
  Filled 2019-02-14: qty 1

## 2019-02-14 MED ORDER — HEPARIN SODIUM (PORCINE) 5000 UNIT/ML IJ SOLN
5000.0000 [IU] | Freq: Three times a day (TID) | INTRAMUSCULAR | Status: DC
  Administered 2019-02-15 – 2019-02-16 (×4): 5000 [IU] via SUBCUTANEOUS
  Filled 2019-02-14 (×4): qty 1

## 2019-02-14 MED ORDER — SODIUM CHLORIDE 0.9 % IV SOLN
INTRAVENOUS | Status: DC
  Administered 2019-02-15 (×3): via INTRAVENOUS

## 2019-02-14 MED ORDER — ASPIRIN EC 81 MG PO TBEC
81.0000 mg | DELAYED_RELEASE_TABLET | Freq: Every day | ORAL | Status: DC
  Administered 2019-02-15 – 2019-02-16 (×2): 81 mg via ORAL
  Filled 2019-02-14 (×2): qty 1

## 2019-02-14 MED ORDER — ATORVASTATIN CALCIUM 20 MG PO TABS
80.0000 mg | ORAL_TABLET | Freq: Every day | ORAL | Status: DC
  Administered 2019-02-15: 23:00:00 80 mg via ORAL
  Filled 2019-02-14: qty 4

## 2019-02-14 MED ORDER — ACETAMINOPHEN 325 MG PO TABS: 650.0000 mg | ORAL_TABLET | Freq: Four times a day (QID) | ORAL | Status: DC | PRN

## 2019-02-14 MED ORDER — ONDANSETRON HCL 4 MG PO TABS: 4.0000 mg | ORAL_TABLET | Freq: Four times a day (QID) | ORAL | Status: DC | PRN

## 2019-02-14 MED ORDER — ACETAMINOPHEN 650 MG RE SUPP: 650.0000 mg | Freq: Four times a day (QID) | RECTAL | Status: DC | PRN

## 2019-02-14 NOTE — ED Notes (Signed)
ED TO INPATIENT HANDOFF REPORT  ED Nurse Name and Phone #: Balinda Quails Name/Age/Gender Bryan Brewer 78 y.o. male Room/Bed: ED04A/ED04A  Code Status   Code Status: Prior  Home/SNF/Other Home Patient oriented to: self, place, time and situation Is this baseline? Yes   Triage Complete: Triage complete  Chief Complaint Dizziness  Triage Note Pt to ED via POV, pt states that he started having dizziness and shortness of breath this morning. Pt reports that he has recently been seen by Dr. Clayborn Bigness and Dr. Raul Del, states that they are not sure if the shortness of breath is from his heart or lungs. Pt denies chest pain. Pt is in NAD.    Allergies No Known Allergies  Level of Care/Admitting Diagnosis ED Disposition    ED Disposition Condition [facility]: [place] [[id number]]  Level of Care: Med-Surg [16]  Covid Evaluation: Confirmed COVID Negative  Diagnosis: Acute on chronic renal failure [facility]) [[id number]]  Admitting Physician: Henreitta Leber [[id number]]  Attending Physician: Henreitta Leber [[id number]]  Estimated length of stay: past midnight tomorrow  Certification:: I certify this patient will need inpatient services for at least 2 midnights  PT Class (Do Not Modify): Inpatient [101]  PT Acc Code (Do Not Modify): Private [1]       B Medical/Surgery History Past Medical History:  Diagnosis Date  . Bladder cancer ([place])   . COPD (chronic obstructive pulmonary disease) ([place])   . Hyperlipidemia   . Hypertension   . Kidney disease   . Type 2 diabetes mellitus ([place])    Past Surgical History:  Procedure Laterality Date  . LEFT HEART CATH AND CORONARY ANGIOGRAPHY N/A 10/31/2017   Procedure: LEFT HEART CATH AND CORONARY ANGIOGRAPHY;  Surgeon: Yolonda Kida, MD;  Location: [place] [facility];  Service: Cardiovascular;  Laterality: N/A;  . REVISION UROSTOMY CUTANEOUS       A IV Location/Drains/Wounds Patient  Lines/Drains/Airways Status   Active Line/Drains/Airways    Name:   Placement date:   Placement time:   Site:   Days:   Peripheral IV 02/14/19 Right Wrist   02/14/19    2210    Wrist   less than 1   Urostomy Ureterostomy left LLQ   -    -    LLQ             Intake/Output Last 24 hours No intake or output data in the 24 hours ending 02/14/19 2247  Labs/Imaging Results for orders placed or performed during the hospital encounter of 02/14/19 (from the past 48 hour(s))  Basic metabolic panel     Status: Abnormal   Collection Time: 02/14/19  4:15 PM  Result Value Ref Range   Sodium 135 135 - 145 mmol/L   Potassium 4.1 3.5 - 5.1 mmol/L   Chloride 109 98 - 111 mmol/L   CO2 15 (L) 22 - 32 mmol/L   Glucose, Bld 191 (H) 70 - 99 mg/dL   BUN 82 (H) 8 - 23 mg/dL   Creatinine, Ser 3.53 (H) 0.61 - 1.24 mg/dL   Calcium 8.2 (L) 8.9 - 10.3 mg/dL   GFR calc non Af Amer 16 (L) >60 mL/min   GFR calc Af Amer 18 (L) >60 mL/min   Anion gap 11 5 - 15    Comment: Performed at [facility], [address], [place], [place] [address]  CBC     Status: Abnormal   Collection Time: 02/14/19  4:15 PM  Result Value Ref Range   WBC 14.5 (H) 4.0 - 10.5 K/uL   RBC 3.58 (L) 4.22 - 5.81 MIL/uL   Hemoglobin 10.0 (L) 13.0 - 17.0 g/dL   HCT 32.0 (L) 39.0 - 52.0 %   MCV 89.4 80.0 - 100.0 fL   MCH 27.9 26.0 - 34.0 pg   MCHC 31.3 30.0 - 36.0 g/dL   RDW 14.7 11.5 - 15.5 %   Platelets 289 150 - 400 K/uL   nRBC 0.0 0.0 - 0.2 %    Comment: Performed at [facility], [place], [place] [address]  Troponin I (High Sensitivity)     Status: None   Collection Time: 02/14/19  4:15 PM  Result Value Ref Range   Troponin I (High Sensitivity) 14 <18 ng/L    Comment: (NOTE) Elevated high sensitivity troponin I (hsTnI) values and significant  changes across serial measurements may suggest ACS but many other  chronic and acute conditions are known to elevate hsTnI results.  Refer to the  "Links" section for chest pain algorithms and additional  guidance. Performed at [facility], [address] [place], [place] [address]   SARS Coronavirus 2 (CEPHEID - Performed in [facility] hospital lab), Hosp Order     Status: None   Collection Time: 02/14/19  8:59 PM   Specimen: Nasopharyngeal Swab  Result Value Ref Range   SARS Coronavirus 2 NEGATIVE NEGATIVE    Comment: (NOTE) If result is NEGATIVE SARS-CoV-2 target nucleic acids are NOT DETECTED. The SARS-CoV-2 RNA is generally detectable in upper and lower  respiratory specimens during the acute phase of infection. The lowest  concentration of SARS-CoV-2 viral copies this assay can detect is 250  copies / mL. A negative result does not preclude SARS-CoV-2 infection  and should not be used as the sole basis for treatment or other  patient management decisions.  A negative result may occur with  improper specimen collection / handling, submission of specimen other  than nasopharyngeal swab, presence of viral mutation(s) within the  areas targeted by this assay, and inadequate number of viral copies  (<250 copies / mL). A negative result must be combined with clinical  observations, patient history, and epidemiological information. If result is POSITIVE SARS-CoV-2 target nucleic acids are DETECTED. The SARS-CoV-2 RNA is generally detectable in upper and lower  respiratory specimens dur ing the acute phase of infection.  Positive  results are indicative of active infection with SARS-CoV-2.  Clinical  correlation with patient history and other diagnostic information is  necessary to determine patient infection status.  Positive results do  not rule out bacterial infection or co-infection with other viruses. If result is PRESUMPTIVE POSTIVE SARS-CoV-2 nucleic acids MAY BE PRESENT.   A presumptive positive result was obtained on the submitted specimen  and confirmed on repeat testing.  While 2019 novel coronavirus   (SARS-CoV-2) nucleic acids may be present in the submitted sample  additional confirmatory testing may be necessary for epidemiological  and / or clinical management purposes  to differentiate between  SARS-CoV-2 and other Sarbecovirus currently known to infect humans.  If clinically indicated additional testing with an alternate test  methodology [phone]) is advised. The SARS-CoV-2 RNA is generally  detectable in upper and lower respiratory sp ecimens during the acute  phase of infection. The expected result is Negative. Fact Sheet for Patients:  StrictlyIdeas.no Fact Sheet for Healthcare Providers: BankingDealers.co.za This test is not yet approved or cleared by  the [employer] and has been authorized for detection and/or diagnosis of SARS-CoV-2 by FDA under an Emergency Use Authorization (EUA).  This EUA will remain in effect (meaning this test can be used) for the duration of the COVID-19 declaration under Section 564(b)(1) of the Act, 21 U.S.C. section 360bbb-3(b)(1), unless the authorization is terminated or revoked sooner. Performed at [facility], [address] [place], [place] [address]   Glucose, capillary     Status: Abnormal   Collection Time: 02/14/19 10:40 PM  Result Value Ref Range   Glucose-Capillary 255 (H) 70 - 99 mg/dL   Dg Chest Portable 1 View  Result Date: 02/14/2019 CLINICAL DATA:  Cough. COPD. Weakness and lightheadedness. EXAM: PORTABLE CHEST 1 VIEW COMPARISON:  10/29/2017 FINDINGS: The heart size and mediastinal contours are within normal limits. Prior CABG again noted. Stable prominence of pulmonary interstitial markings. No evidence of acute infiltrate or edema. No evidence of pleural effusion. The visualized skeletal structures are unremarkable. IMPRESSION: Stable exam. No active disease. Electronically Signed   By: Marlaine Hind M.D.   On: 02/14/2019 21:07    Pending Labs Unresulted Labs  (From admission, onward)    Start     Ordered   02/14/19 2141  Hemoglobin A1c  Once,   STAT    Comments: To assess prior glycemic control    02/14/19 2140   02/14/19 1615  Urinalysis, Complete w Microscopic  ONCE - STAT,   STAT     02/14/19 1615   Signed and Held  Basic metabolic panel  Tomorrow morning,   R     Signed and Held   Signed and Held  CBC  Tomorrow morning,   R     Signed and Held   Signed and Held  CBC  (heparin)  Once,   R    Comments: Baseline for heparin therapy IF NOT ALREADY DRAWN.  Notify MD if PLT < 100 K.    Signed and Held   Signed and Held  Creatinine, serum  (heparin)  Once,   R    Comments: Baseline for heparin therapy IF NOT ALREADY DRAWN.    Signed and Held          Vitals/Pain Today's Vitals   02/14/19 1616 02/14/19 1946 02/14/19 1949 02/14/19 2000  BP: (!) 100/49 120/70  120/66  Pulse: 60 62  63  Resp: 16 20  (!) 21  Temp: 98.7 F (37.1 C)     TempSrc: Oral     SpO2: 96% 100%  98%  PainSc:   0-No pain     Isolation Precautions No active isolations  Medications Medications  sodium chloride flush (NS) 0.9 % injection 3 mL (3 mLs Intravenous Not Given 02/14/19 1946)  insulin aspart (novoLOG) injection 0-9 Units (has no administration in time range)  insulin aspart (novoLOG) injection 0-5 Units (3 Units Subcutaneous Given 02/14/19 2245)    Mobility walks Low fall risk   Focused Assessments Pulmonary Assessment Handoff:  Lung sounds: L Breath Sounds: Expiratory wheezes R Breath Sounds: Clear O2 Device: Room Air        R Recommendations: See Admitting Provider Note  Report given to:   Additional Notes:

## 2019-02-14 NOTE — ED Triage Notes (Signed)
Pt to ED via POV, pt states that he started having dizziness and shortness of breath this morning. Pt reports that he has recently been seen by Dr. Clayborn Bigness and Dr. Raul Del, states that they are not sure if the shortness of breath is from his heart or lungs. Pt denies chest pain. Pt is in NAD.

## 2019-02-14 NOTE — ED Notes (Signed)
Pt's car keys given to his wife who is waiting outside patiently; wife's cell phone number put in pt's information chart

## 2019-02-14 NOTE — ED Notes (Signed)
This RN made 2 unsuccessful attempts at PIV insertion. Selinda Eon RN called to obtain PIV access.

## 2019-02-14 NOTE — H&P (Signed)
[place] [facility] [place] NAME: Bryan Brewer    MR#:  [id number]  DATE OF BIRTH:  04-11-1941  DATE OF ADMISSION:  02/14/2019  PRIMARY CARE PHYSICIAN: Glendon Axe, MD   REQUESTING/REFERRING PHYSICIAN: Dr. Harvest Dark  CHIEF COMPLAINT:   Chief Complaint  Patient presents with  . Dizziness  . Shortness of Breath    HISTORY OF PRESENT ILLNESS:  Bryan Brewer  is a 78 y.o. male with a known history of coronary artery disease status post bypass, COPD, bladder cancer, hypertension hyperlipidemia, CKD stage III, diabetes who presents to the hospital due to dizziness and feeling short of breath.  Patient says he has been having persistent dizziness ongoing for the past few days progressively getting worse.  Patient says he gets very weak and lightheaded and dizzy and has had some near syncopal episodes over the past several days.  He has been followed by his pulmonologist Dr. Raul Del and also by Dr. Clayborn Bigness and they have recently adjusted some of his medications but despite that he continues to have symptoms and therefore came to the ER for further evaluation.  In the emergency room patient on a routine blood work was noted to be in acute on chronic renal failure and therefore hospitalist services were contacted for admission.  Patient denies any abdominal pain nausea vomiting, poor p.o. intake, headache, fever, chills, loss of taste or any recent sick contacts.  Patient's COVID-19 test still pending.  PAST MEDICAL HISTORY:   Past Medical History:  Diagnosis Date  . Bladder cancer ([place])   . COPD (chronic obstructive pulmonary disease) ([place])   . Hyperlipidemia   . Hypertension   . Kidney disease   . Type 2 diabetes mellitus ([place])     PAST SURGICAL HISTORY:   Past Surgical History:  Procedure Laterality Date  . LEFT HEART CATH AND CORONARY ANGIOGRAPHY N/A 10/31/2017   Procedure: LEFT HEART CATH AND CORONARY ANGIOGRAPHY;  Surgeon: Yolonda Kida, MD;  Location: [place] [facility];  Service: Cardiovascular;  Laterality: N/A;  . REVISION UROSTOMY CUTANEOUS      SOCIAL HISTORY:   Social History   Tobacco Use  . Smoking status: Former Smoker    Packs/day: 1.50    Years: 35.00    Pack years: 52.50    Types: Cigarettes    Quit date: 2013    Years since quitting: 7.5  . Smokeless tobacco: Never Used  Substance Use Topics  . Alcohol use: No    Frequency: Never    FAMILY HISTORY:   Family History  Problem Relation Age of Onset  . CAD Mother   . Stroke Mother   . CAD Father   . Stroke Father   . Breast cancer Sister     DRUG ALLERGIES:  No Known Allergies  REVIEW OF SYSTEMS:   Review of Systems  Constitutional: Negative for fever and weight loss.  HENT: Negative for congestion, nosebleeds and tinnitus.   Eyes: Negative for blurred vision, double vision and redness.  Respiratory: Negative for cough, hemoptysis and shortness of breath.   Cardiovascular: Negative for chest pain, orthopnea, leg swelling and PND.  Gastrointestinal: Negative for abdominal pain, diarrhea, melena, nausea and vomiting.  Genitourinary: Negative for dysuria, hematuria and urgency.  Musculoskeletal: Negative for falls and joint pain.  Neurological: Positive for dizziness. Negative for tingling, sensory change, focal weakness, seizures, weakness and headaches.  Endo/Heme/Allergies: Negative for polydipsia. Does not bruise/bleed easily.  Psychiatric/Behavioral: Negative for depression  and memory loss. The patient is not nervous/anxious.     MEDICATIONS AT HOME:   Prior to Admission medications   Medication Sig Start Date End Date Taking? Authorizing Provider  aspirin EC 81 MG tablet Take 81 mg by mouth daily.   Yes [provider]  atorvastatin (LIPITOR) 80 MG tablet Take 80 mg by mouth at bedtime.   Yes [provider]  glimepiride (AMARYL) 2 MG tablet Take 2 mg by mouth daily with breakfast.   Yes [provider]  hydrALAZINE (APRESOLINE) 25 MG tablet Take 25 mg by mouth 3 (three) times daily.   Yes [provider]  isosorbide mononitrate (IMDUR) 30 MG 24 hr tablet Take 30 mg by mouth daily.   Yes [provider]  metoprolol tartrate (LOPRESSOR) 50 MG tablet Take 50 mg by mouth 2 (two) times daily.   Yes [provider]  sodium bicarbonate 650 MG tablet Take 1,300 mg by mouth 3 (three) times daily.   Yes [provider]  spironolactone (ALDACTONE) 25 MG tablet Take 12.5 mg by mouth daily.   Yes [provider]  torsemide (DEMADEX) 20 MG tablet Take 40 mg by mouth daily.   Yes [provider]      VITAL SIGNS:  Blood pressure 120/66, pulse 63, temperature 98.7 F (37.1 C), temperature source Oral, resp. rate (!) 21, SpO2 98 %.  PHYSICAL EXAMINATION:  Physical Exam  GENERAL:  78 y.o.-year-old patient lying in the bed in no acute distress.  EYES: Pupils equal, round, reactive to light and accommodation. No scleral icterus. Extraocular muscles intact.  HEENT: Head atraumatic, normocephalic. Oropharynx and nasopharynx clear. No oropharyngeal erythema, moist oral mucosa  NECK:  Supple, no jugular venous distention. No thyroid enlargement, no tenderness.  LUNGS: Normal breath sounds bilaterally, no wheezing, rales, diffuse rhonchi b/l. No use of accessory muscles of respiration.  CARDIOVASCULAR: S1, S2 RRR. No murmurs, rubs, gallops, clicks.  ABDOMEN: Soft, nontender, nondistended. Bowel sounds present. No organomegaly or mass.  EXTREMITIES:+ LLE edema L > R, No cyanosis, or clubbing. + 2 pedal & radial pulses b/l.   NEUROLOGIC: Cranial nerves II through XII are intact. No focal Motor or sensory deficits appreciated b/l PSYCHIATRIC: The patient is alert and oriented x 3.  SKIN: No obvious rash, lesion, or ulcer.   LABORATORY PANEL:   CBC Recent Labs  Lab 02/14/19 1615  WBC 14.5*  HGB 10.0*  HCT 32.0*  PLT 289    ------------------------------------------------------------------------------------------------------------------  Chemistries  Recent Labs  Lab 02/14/19 1615  NA 135  K 4.1  CL 109  CO2 15*  GLUCOSE 191*  BUN 82*  CREATININE 3.53*  CALCIUM 8.2*   ------------------------------------------------------------------------------------------------------------------  Cardiac Enzymes No results for input(s): TROPONINI in the last 168 hours. ------------------------------------------------------------------------------------------------------------------  RADIOLOGY:  Dg Chest Portable 1 View  Result Date: 02/14/2019 CLINICAL DATA:  Cough. COPD. Weakness and lightheadedness. EXAM: PORTABLE CHEST 1 VIEW COMPARISON:  10/29/2017 FINDINGS: The heart size and mediastinal contours are within normal limits. Prior CABG again noted. Stable prominence of pulmonary interstitial markings. No evidence of acute infiltrate or edema. No evidence of pleural effusion. The visualized skeletal structures are unremarkable. IMPRESSION: Stable exam. No active disease. Electronically Signed   By: Marlaine Hind M.D.   On: 02/14/2019 21:07     IMPRESSION AND PLAN:   78 y.o. male with a known history of coronary artery disease status post bypass, COPD, bladder cancer, hypertension hyperlipidemia, CKD stage III, diabetes who presents to the hospital  due to dizziness and feeling short of breath.  1.  Acute on chronic renal failure- patient presents to the hospital with a creatinine of 3.5 and his baseline creatinines around 2.3. -Suspected to be secondary to mild dehydration/over diuresis on oral diuretics. - We will gently hydrate the patient with IV fluids, follow BUN and creatinine urine output.  Hold patient's diuretics for now. -If needed consider nephrology consult.  Patient is followed by [facility] nephrology.  2.  Dizziness-suspect to be secondary to the acute on chronic renal failure and mild dehydration. - We  will gently hydrate the patient with IV fluids, follow symptoms.  Will get orthostatic vital signs.  3.  Essential hypertension-continue hydralazine, Imdur, metoprolol.  4.  Diabetes type 2 with CKD stage III-blood sugar stable.  Continue glimepiride. -Will place on sliding scale insulin.  5.  Hyperlipidemia-continue atorvastatin.    All the records are reviewed and case discussed with ED provider. Management plans discussed with the patient, family and they are in agreement.  CODE STATUS: Full code  TOTAL TIME TAKING CARE OF THIS PATIENT: 40 minutes.    Henreitta Leber M.D on 02/14/2019 at 9:36 PM  Between 7am to 6pm - Pager - [phone]  After 6pm go to [email or web address] - password [email or web address] [facility]  [place] [facility]  Office  [phone]  CC: Primary care physician; Glendon Axe, MD

## 2019-02-14 NOTE — ED Notes (Signed)
ED Provider at bedside. 

## 2019-02-14 NOTE — ED Notes (Signed)
Patient c/o earlier onset of SOB and lightheadedness that has since resolved. Patient reports hx of pulmonary and cardiac issues. Patient c/o productive cough with thick white sputum that has been ongoing "for a while now". Patient denies pain.

## 2019-02-14 NOTE — ED Provider Notes (Signed)
[facility] Emergency Department Provider Note  Time seen: 8:08 PM  I have reviewed the triage vital signs and the nursing notes.   HISTORY  Chief Complaint Dizziness and Shortness of Breath   HPI Bryan Brewer is a 78 y.o. male with a past medical history of COPD, hypertension, hyperlipidemia, CKD, diabetes, presents to the emergency department for generalized weakness lightheadedness.  Patient states over the past 1 month he has had a persistent cough.  Was seen by cardiology as well as a pulmonologist, they have changed 3 the patient's medications recently.  States over the past several days he has been feeling intermittently weak, today had a near syncopal episode where he felt very weak lightheaded and dizzy.  Patient denies any fever at any point.  Denies any chest pain.   Past Medical History:  Diagnosis Date  . Bladder cancer ([place])   . COPD (chronic obstructive pulmonary disease) ([place])   . Hyperlipidemia   . Hypertension   . Kidney disease   . Type 2 diabetes mellitus [facility])     Patient Active Problem List   Diagnosis Date Noted  . NSTEMI (non-ST elevated myocardial infarction) ([place]) 10/30/2017  . Diabetes ([place]) 10/30/2017  . CKD (chronic kidney disease), stage IV ([place]) 10/30/2017  . HTN (hypertension) 10/30/2017  . HLD (hyperlipidemia) 10/30/2017  . COPD (chronic obstructive pulmonary disease) ([place]) 10/30/2017    Past Surgical History:  Procedure Laterality Date  . LEFT HEART CATH AND CORONARY ANGIOGRAPHY N/A 10/31/2017   Procedure: LEFT HEART CATH AND CORONARY ANGIOGRAPHY;  Surgeon: Yolonda Kida, MD;  Location: [place] [facility];  Service: Cardiovascular;  Laterality: N/A;  . REVISION UROSTOMY CUTANEOUS      Prior to Admission medications   Not on File    No Known Allergies  Family History  Problem Relation Age of Onset  . CAD Mother   . Stroke Mother   . CAD Father   . Stroke Father   . Breast cancer Sister     Social  History Social History   Tobacco Use  . Smoking status: Former Smoker    Types: Cigarettes    Quit date: 2013    Years since quitting: 7.5  . Smokeless tobacco: Never Used  Substance Use Topics  . Alcohol use: No    Frequency: Never  . Drug use: Never    Review of Systems Constitutional: Negative for fever.  Positive for generalized weakness.  Dizziness. Cardiovascular: Negative for chest pain. Respiratory: Negative for shortness of breath. Gastrointestinal: Negative for abdominal pain, vomiting Genitourinary: Negative for urinary compaints Musculoskeletal: Negative for musculoskeletal complaints Neurological: Negative for headache All other ROS negative  ____________________________________________   PHYSICAL EXAM:  VITAL SIGNS: ED Triage Vitals  Enc Vitals Group     BP 02/14/19 1616 (!) 100/49     Pulse Rate 02/14/19 1616 60     Resp 02/14/19 1616 16     Temp 02/14/19 1616 98.7 F (37.1 C)     Temp Source 02/14/19 1616 Oral     SpO2 02/14/19 1616 96 %     Weight --      Height --      Head Circumference --      Peak Flow --      Pain Score 02/14/19 1614 0     Pain Loc --      Pain Edu? --      Excl. in [place]? --    Constitutional: Alert and oriented. Well  appearing and in no distress. Eyes: Normal exam ENT      Head: Normocephalic and atraumatic.      Mouth/Throat: Mucous membranes are moist. Cardiovascular: Normal rate, regular rhythm.  Respiratory: Normal respiratory effort without tachypnea nor retractions. Breath sounds are clear Gastrointestinal: Soft and nontender. No distention.   Musculoskeletal: Nontender with normal range of motion in all extremities. Neurologic:  Normal speech and language. No gross focal neurologic deficits  Skin:  Skin is warm, dry and intact.  Psychiatric: Mood and affect are normal.   ____________________________________________    EKG  EKG viewed and interpreted by myself shows a normal sinus rhythm at 64 bpm with a  narrow QRS, normal axis, normal intervals, no concerning ST changes.  ____________________________________________   INITIAL IMPRESSION / ASSESSMENT AND PLAN / ED COURSE  Pertinent labs & imaging results that were available during my care of the patient were reviewed by me and considered in my medical decision making (see chart for details).   Patient presents emergency department for generalized weakness, dizziness and lightheadedness.  Differential would include dehydration, hypovolemia, ACS, anemia, metabolic abnormality.  Patient's labs have resulted showing renal insufficiency with a creatinine of 3.5 from a baseline of 2.3.  Patient's white blood cell count is elevated at 14.5.  Troponin is negative.  We will obtain a chest x-ray given the patient's cough and elevated white blood cell count.  Patient is afebrile.  We will send a corona swab as a precaution.  Given the patient is near syncopal episode with nearly doubled creatinine patient will require admission to the hospital for further treatment.  Patient agreeable to plan of care.  Bryan Brewer was evaluated in Emergency Department on 02/14/2019 for the symptoms described in the history of present illness. He was evaluated in the context of the global COVID-19 pandemic, which necessitated consideration that the patient might be at risk for infection with the SARS-CoV-2 virus that causes COVID-19. Institutional protocols and algorithms that pertain to the evaluation of patients at risk for COVID-19 are in a state of rapid change based on information released by regulatory bodies including the CDC and federal and state organizations. These policies and algorithms were followed during the patient's care in the ED.  ____________________________________________   FINAL CLINICAL IMPRESSION(S) / ED DIAGNOSES  weakness Renal insufficiency   Harvest Dark, MD 02/14/19 2041

## 2019-02-15 ENCOUNTER — Inpatient Hospital Stay: Payer: Medicare Other

## 2019-02-15 ENCOUNTER — Encounter: Payer: Self-pay | Admitting: *Deleted

## 2019-02-15 LAB — BASIC METABOLIC PANEL
Anion gap: 9 (ref 5–15)
BUN: 82 mg/dL — ABNORMAL HIGH (ref 8–23)
CO2: 16 mmol/L — ABNORMAL LOW (ref 22–32)
Calcium: 8.2 mg/dL — ABNORMAL LOW (ref 8.9–10.3)
Chloride: 113 mmol/L — ABNORMAL HIGH (ref 98–111)
Creatinine, Ser: 3.12 mg/dL — ABNORMAL HIGH (ref 0.61–1.24)
GFR calc Af Amer: 21 mL/min — ABNORMAL LOW (ref >60)
GFR calc non Af Amer: 18 mL/min — ABNORMAL LOW (ref >60)
Glucose, Bld: 119 mg/dL — ABNORMAL HIGH (ref 70–99)
Potassium: 3.8 mmol/L (ref 3.5–5.1)
Sodium: 138 mmol/L (ref 135–145)

## 2019-02-15 LAB — CBC
HCT: 33.9 % — ABNORMAL LOW (ref 39.0–52.0)
Hemoglobin: 10.4 g/dL — ABNORMAL LOW (ref 13.0–17.0)
MCH: 28.1 pg (ref 26.0–34.0)
MCHC: 30.7 g/dL (ref 30.0–36.0)
MCV: 91.6 fL (ref 80.0–100.0)
Platelets: 292 10*3/uL (ref 150–400)
RBC: 3.7 MIL/uL — ABNORMAL LOW (ref 4.22–5.81)
RDW: 14.7 % (ref 11.5–15.5)
WBC: 11 10*3/uL — ABNORMAL HIGH (ref 4.0–10.5)
nRBC: 0 % (ref 0.0–0.2)

## 2019-02-15 LAB — GLUCOSE, CAPILLARY
Glucose-Capillary: 170 mg/dL — ABNORMAL HIGH (ref 70–99)
Glucose-Capillary: 174 mg/dL — ABNORMAL HIGH (ref 70–99)
Glucose-Capillary: 199 mg/dL — ABNORMAL HIGH (ref 70–99)
Glucose-Capillary: 267 mg/dL — ABNORMAL HIGH (ref 70–99)
Glucose-Capillary: 50 mg/dL — ABNORMAL LOW (ref 70–99)
Glucose-Capillary: 88 mg/dL (ref 70–99)

## 2019-02-15 LAB — HEMOGLOBIN A1C
Hgb A1c MFr Bld: 7.1 % — ABNORMAL HIGH (ref 4.8–5.6)
Mean Plasma Glucose: 157.07 mg/dL

## 2019-02-15 MED ORDER — GUAIFENESIN ER 600 MG PO TB12
600.0000 mg | ORAL_TABLET | Freq: Two times a day (BID) | ORAL | Status: DC
  Administered 2019-02-15 – 2019-02-16 (×3): 600 mg via ORAL
  Filled 2019-02-15 (×3): qty 1

## 2019-02-15 MED ORDER — BENZONATATE 100 MG PO CAPS: 200.0000 mg | ORAL_CAPSULE | Freq: Three times a day (TID) | ORAL | Status: DC | PRN

## 2019-02-15 MED ORDER — INSULIN ASPART 100 UNIT/ML ~~LOC~~ SOLN
3.0000 [IU] | Freq: Once | SUBCUTANEOUS | Status: AC
  Administered 2019-02-15: 3 [IU] via SUBCUTANEOUS
  Filled 2019-02-15: qty 1

## 2019-02-15 MED ORDER — HYDRALAZINE HCL 10 MG PO TABS
10.0000 mg | ORAL_TABLET | Freq: Three times a day (TID) | ORAL | Status: DC
  Filled 2019-02-15 (×5): qty 1

## 2019-02-15 NOTE — Progress Notes (Signed)
[place] [facility] [place] NAME: Bryan Brewer    MR#:  [id number]  DATE OF BIRTH:  September 20, 1940  SUBJECTIVE:  CHIEF COMPLAINT:   Chief Complaint  Patient presents with  . Dizziness  . Shortness of Breath   Came with dizziness, felt slightly better.  Noted to have hypotension and renal failure.  Also had hypoglycemia today morning.  With IV fluid renal failure is slightly better. REVIEW OF SYSTEMS:  CONSTITUTIONAL: No fever,have fatigue or weakness.  EYES: No blurred or double vision.  EARS, NOSE, AND THROAT: No tinnitus or ear pain.  RESPIRATORY: No cough, shortness of breath, wheezing or hemoptysis.  CARDIOVASCULAR: No chest pain, orthopnea, edema.  GASTROINTESTINAL: No nausea, vomiting, diarrhea or abdominal pain.  GENITOURINARY: No dysuria, hematuria.  ENDOCRINE: No polyuria, nocturia,  HEMATOLOGY: No anemia, easy bruising or bleeding SKIN: No rash or lesion. MUSCULOSKELETAL: No joint pain or arthritis.   NEUROLOGIC: No tingling, numbness, weakness.  PSYCHIATRY: No anxiety or depression.   ROS  DRUG ALLERGIES:  No Known Allergies  VITALS:  Blood pressure (!) 105/58, pulse 64, temperature 98.5 F (36.9 C), resp. rate 18, height 5\' 7"  (1.702 m), weight 73.4 kg, SpO2 97 %.  PHYSICAL EXAMINATION:  GENERAL:  78 y.o.-year-old patient lying in the bed with no acute distress.  EYES: Pupils equal, round, reactive to light and accommodation. No scleral icterus. Extraocular muscles intact.  HEENT: Head atraumatic, normocephalic. Oropharynx and nasopharynx clear.  NECK:  Supple, no jugular venous distention. No thyroid enlargement, no tenderness.  LUNGS: Normal breath sounds bilaterally, no wheezing, rales,rhonchi or crepitation. No use of accessory muscles of respiration.  CARDIOVASCULAR: S1, S2 normal. No murmurs, rubs, or gallops.  ABDOMEN: Soft, nontender, nondistended. Bowel sounds present. No organomegaly or mass.  EXTREMITIES: No pedal  edema, cyanosis, or clubbing.  NEUROLOGIC: Cranial nerves II through XII are intact. Muscle strength 4/5 in all extremities. Sensation intact. Gait not checked.  PSYCHIATRIC: The patient is alert and oriented x 3.  SKIN: No obvious rash, lesion, or ulcer.   Physical Exam LABORATORY PANEL:   CBC Recent Labs  Lab 02/15/19 0402  WBC 11.0*  HGB 10.4*  HCT 33.9*  PLT 292   ------------------------------------------------------------------------------------------------------------------  Chemistries  Recent Labs  Lab 02/15/19 0402  NA 138  K 3.8  CL 113*  CO2 16*  GLUCOSE 119*  BUN 82*  CREATININE 3.12*  CALCIUM 8.2*   ------------------------------------------------------------------------------------------------------------------  Cardiac Enzymes No results for input(s): TROPONINI in the last 168 hours. ------------------------------------------------------------------------------------------------------------------  RADIOLOGY:  Dg Chest Portable 1 View  Result Date: 02/14/2019 CLINICAL DATA:  Cough. COPD. Weakness and lightheadedness. EXAM: PORTABLE CHEST 1 VIEW COMPARISON:  10/29/2017 FINDINGS: The heart size and mediastinal contours are within normal limits. Prior CABG again noted. Stable prominence of pulmonary interstitial markings. No evidence of acute infiltrate or edema. No evidence of pleural effusion. The visualized skeletal structures are unremarkable. IMPRESSION: Stable exam. No active disease. Electronically Signed   By: Marlaine Hind M.D.   On: 02/14/2019 21:07    ASSESSMENT AND PLAN:   Active Problems:   Acute on chronic renal failure ([place])   78 y.o. male with a known history of coronary artery disease status post bypass, COPD, bladder cancer, hypertension hyperlipidemia, CKD stage III, diabetes who presents to the hospital due to dizziness and feeling short of breath.  1.  Acute on chronic renal failure- patient presents to the hospital with a  creatinine of 3.5 and his baseline creatinines around  2.3. - Suspected to be secondary to mild dehydration/over diuresis on oral diuretics. - We will gently hydrate the patient with IV fluids, follow BUN and creatinine urine output.  Hold patient's diuretics for now. - Patient is followed by [facility] nephrology.  2.  Dizziness-suspect to be secondary to the acute on chronic renal failure and mild dehydration. - We will gently hydrate the patient with IV fluids, follow symptoms.  get orthostatic vital signs.  3.  Essential hypertension-continue hydralazine, Imdur, metoprolol.  4.  Diabetes type 2 with CKD stage III-blood sugar stable.     Hypoglycemia-due to worsening renal function and glimepiride. Hold glimepiride and continue sliding scale coverage.  5.  Hyperlipidemia-continue atorvastatin.   All the records are reviewed and case discussed with Care Management/Social Workerr. Management plans discussed with the patient, family and they are in agreement.  CODE STATUS: Full code.  TOTAL TIME TAKING CARE OF THIS PATIENT: 35 minutes.     POSSIBLE D/C IN 1-2 DAYS, DEPENDING ON CLINICAL CONDITION.   Vaughan Basta M.D on 02/15/2019   Between 7am to 6pm - Pager - [phone]  After 6pm go to [email or web address] - password [email or web address] [place] [facility]  Office  [phone]  CC: Primary care physician; Glendon Axe, MD  Note: This dictation was prepared with Dragon dictation along with smaller phrase technology. Any transcriptional errors that result from this process are unintentional.

## 2019-02-15 NOTE — Plan of Care (Signed)

## 2019-02-15 NOTE — Progress Notes (Signed)
Patient tachypneic, lungs diminished. Received verbal order from Dr.Willis to stop fluids and stat chest x ray. Vitals WNL.

## 2019-02-15 NOTE — Progress Notes (Signed)
Family Meeting Note  Advance Directive:yes  Today a meeting took place with the Patient.  The following clinical team members were present during this meeting:MD  The following were discussed:Patient's diagnosis: Acute on chronic renal failure, diabetes, hypertension, Patient's progosis: Unable to determine and Goals for treatment: Full Code  Additional follow-up to be provided: PMD  Time spent during discussion:20 minutes  Vaughan Basta, MD

## 2019-02-16 LAB — BASIC METABOLIC PANEL
Anion gap: 10 (ref 5–15)
BUN: 70 mg/dL — ABNORMAL HIGH (ref 8–23)
CO2: 15 mmol/L — ABNORMAL LOW (ref 22–32)
Calcium: 8.6 mg/dL — ABNORMAL LOW (ref 8.9–10.3)
Chloride: 111 mmol/L (ref 98–111)
Creatinine, Ser: 2.79 mg/dL — ABNORMAL HIGH (ref 0.61–1.24)
GFR calc Af Amer: 24 mL/min — ABNORMAL LOW (ref >60)
GFR calc non Af Amer: 21 mL/min — ABNORMAL LOW (ref >60)
Glucose, Bld: 154 mg/dL — ABNORMAL HIGH (ref 70–99)
Potassium: 4.2 mmol/L (ref 3.5–5.1)
Sodium: 136 mmol/L (ref 135–145)

## 2019-02-16 LAB — URINALYSIS, COMPLETE (UACMP) WITH MICROSCOPIC
Bilirubin Urine: NEGATIVE
Glucose, UA: NEGATIVE mg/dL
Ketones, ur: NEGATIVE mg/dL
Nitrite: NEGATIVE
Protein, ur: NEGATIVE mg/dL
Specific Gravity, Urine: 1.009 (ref 1.005–1.030)
pH: 7 (ref 5.0–8.0)

## 2019-02-16 LAB — PROCALCITONIN: Procalcitonin: <0.1 ng/mL

## 2019-02-16 LAB — GLUCOSE, CAPILLARY: Glucose-Capillary: 98 mg/dL (ref 70–99)

## 2019-02-16 MED ORDER — SODIUM CHLORIDE 0.9 % IV SOLN
1.0000 g | Freq: Every day | INTRAVENOUS | Status: DC
  Filled 2019-02-16: qty 10

## 2019-02-16 NOTE — Discharge Summary (Signed)
[facility] [place] NAME: Bryan Brewer    MR#:  [id number]  DATE OF BIRTH:  19-Jul-1941  DATE OF ADMISSION:  02/14/2019 ADMITTING PHYSICIAN: Henreitta Leber, MD  DATE OF DISCHARGE: 02/16/2019   PRIMARY CARE PHYSICIAN: Glendon Axe, MD    ADMISSION DIAGNOSIS:  Dizziness [R42] Renal insufficiency [N28.9] Acute on chronic renal failure ([place]) [N17.9, N18.9]  DISCHARGE DIAGNOSIS:  Active Problems:   Acute on chronic renal failure ([place])   SECONDARY DIAGNOSIS:   Past Medical History:  Diagnosis Date  . Bladder cancer ([place])   . COPD (chronic obstructive pulmonary disease) ([place])   . Hyperlipidemia   . Hypertension   . Kidney disease   . Type 2 diabetes mellitus ([place])     HOSPITAL COURSE:   78 y.o.malewith a known history of coronary artery disease status post bypass, COPD, bladder cancer, hypertension hyperlipidemia, CKD stage III, diabetes who presents to the hospital due to dizziness and feeling short of breath.  1. Acute on chronic renal failure-patient presents to the hospital with a creatinine of 3.5 and his baseline creatinines around 2.3. - Suspected to be secondary to mild dehydration/over diuresis on oral diuretics. -We will gently hydrate the patient with IV fluids, follow BUN and creatinine urine output. Hold patient's diuretics for now. -Patient is followed by [facility] nephrology. - creatinin down to 2.8, pt feels much better. Advised to hold torsemide until sees PMD next week and does check renal function.  2. Dizziness-suspect to be secondary to the acute on chronic renal failure and mild dehydration. -We will gently hydrate the patient with IV fluids, follow symptoms.  get orthostatic vital signs.  3. Essential hypertension-continue hydralazine, Imdur, metoprolol.  4. Diabetes type 2 with CKD stage III-blood sugar stable.    Hypoglycemia-due to worsening renal function and glimepiride. Hold  glimepiride and continue sliding scale coverage.  blood sugar stable, now resume on discharge.  5. Hyperlipidemia-continue atorvastatin.  6. Leukocytes in Urine-  Patient had history of bladder cancer and cystectomy was done he has surgery done with likely created pouch to drain the ureters and has external bag to collect the urine.  He does not have fever or high WBCs so will not treat this as a UTI.  DISCHARGE CONDITIONS:   Stable.  CONSULTS OBTAINED:  Treatment Team:  Vaughan Basta, MD  DRUG ALLERGIES:  No Known Allergies  DISCHARGE MEDICATIONS:   Allergies as of 02/16/2019   No Known Allergies     Medication List    STOP taking these medications   torsemide 20 MG tablet Commonly known as: DEMADEX     TAKE these medications   aspirin EC 81 MG tablet Take 81 mg by mouth daily.   atorvastatin 80 MG tablet Commonly known as: LIPITOR Take 80 mg by mouth at bedtime.   glimepiride 2 MG tablet Commonly known as: AMARYL Take 2 mg by mouth daily with breakfast.   hydrALAZINE 25 MG tablet Commonly known as: APRESOLINE Take 25 mg by mouth 3 (three) times daily.   isosorbide mononitrate 30 MG 24 hr tablet Commonly known as: IMDUR Take 30 mg by mouth daily.   metoprolol tartrate 50 MG tablet Commonly known as: LOPRESSOR Take 50 mg by mouth 2 (two) times daily.   sodium bicarbonate 650 MG tablet Take 1,300 mg by mouth 3 (three) times daily.   spironolactone 25 MG tablet Commonly known as: ALDACTONE Take 12.5 mg by mouth daily.  DISCHARGE INSTRUCTIONS:   Follow with PMD in 1 week and get renal function checked.  If you experience worsening of your admission symptoms, develop shortness of breath, life threatening emergency, suicidal or homicidal thoughts you must seek medical attention immediately by calling 911 or calling your MD immediately  if symptoms less severe.  You Must read complete instructions/literature along with all the  possible adverse reactions/side effects for all the Medicines you take and that have been prescribed to you. Take any new Medicines after you have completely understood and accept all the possible adverse reactions/side effects.   Please note  You were cared for by a hospitalist during your hospital stay. If you have any questions about your discharge medications or the care you received while you were in the hospital after you are discharged, you can call the unit and asked to speak with the hospitalist on call if the hospitalist that took care of you is not available. Once you are discharged, your primary care physician will handle any further medical issues. Please note that NO REFILLS for any discharge medications will be authorized once you are discharged, as it is imperative that you return to your primary care physician (or establish a relationship with a primary care physician if you do not have one) for your aftercare needs so that they can reassess your need for medications and monitor your lab values.    Today   CHIEF COMPLAINT:   Chief Complaint  Patient presents with  . Dizziness  . Shortness of Breath    HISTORY OF PRESENT ILLNESS:  Bryan Brewer  is a 78 y.o. male with a known history of coronary artery disease status post bypass, COPD, bladder cancer, hypertension hyperlipidemia, CKD stage III, diabetes who presents to the hospital due to dizziness and feeling short of breath.  Patient says he has been having persistent dizziness ongoing for the past few days progressively getting worse.  Patient says he gets very weak and lightheaded and dizzy and has had some near syncopal episodes over the past several days.  He has been followed by his pulmonologist Dr. Raul Del and also by Dr. Clayborn Bigness and they have recently adjusted some of his medications but despite that he continues to have symptoms and therefore came to the ER for further evaluation.  In the emergency room patient on a  routine blood work was noted to be in acute on chronic renal failure and therefore hospitalist services were contacted for admission.  Patient denies any abdominal pain nausea vomiting, poor p.o. intake, headache, fever, chills, loss of taste or any recent sick contacts.  Patient's COVID-19 test still pending.   VITAL SIGNS:  Blood pressure (!) 113/56, pulse 67, temperature 98.6 F (37 C), temperature source Oral, resp. rate 19, height 5\' 7"  (1.702 m), weight 73.4 kg, SpO2 95 %.  I/O:    Intake/Output Summary (Last 24 hours) at 02/16/2019 1136 Last data filed at 02/16/2019 0300 Gross per 24 hour  Intake 837.82 ml  Output 1000 ml  Net -162.18 ml    PHYSICAL EXAMINATION:   GENERAL:  78 y.o.-year-old patient lying in the bed with no acute distress.  EYES: Pupils equal, round, reactive to light and accommodation. No scleral icterus. Extraocular muscles intact.  HEENT: Head atraumatic, normocephalic. Oropharynx and nasopharynx clear.  NECK:  Supple, no jugular venous distention. No thyroid enlargement, no tenderness.  LUNGS: Normal breath sounds bilaterally, no wheezing, rales,rhonchi or crepitation. No use of accessory muscles of respiration.  CARDIOVASCULAR: S1, S2  normal. No murmurs, rubs, or gallops.  ABDOMEN: Soft, nontender, nondistended. Bowel sounds present. No organomegaly or mass.  Urine drainage bag on lower abdomen attached. EXTREMITIES: No pedal edema, cyanosis, or clubbing.  NEUROLOGIC: Cranial nerves II through XII are intact. Muscle strength 4/5 in all extremities. Sensation intact. Gait not checked.  PSYCHIATRIC: The patient is alert and oriented x 3.  SKIN: No obvious rash, lesion, or ulcer.   DATA REVIEW:   CBC Recent Labs  Lab 02/15/19 0402  WBC 11.0*  HGB 10.4*  HCT 33.9*  PLT 292    Chemistries  Recent Labs  Lab 02/16/19 1046  NA 136  K 4.2  CL 111  CO2 15*  GLUCOSE 154*  BUN 70*  CREATININE 2.79*  CALCIUM 8.6*    Cardiac Enzymes No results  for input(s): TROPONINI in the last 168 hours.  Microbiology Results  Results for orders placed or performed during the hospital encounter of 02/14/19  SARS Coronavirus 2 (CEPHEID - Performed in [facility] hospital lab), Hosp Order     Status: None   Collection Time: 02/14/19  8:59 PM   Specimen: Nasopharyngeal Swab  Result Value Ref Range Status   SARS Coronavirus 2 NEGATIVE NEGATIVE Final    Comment: (NOTE) If result is NEGATIVE SARS-CoV-2 target nucleic acids are NOT DETECTED. The SARS-CoV-2 RNA is generally detectable in upper and lower  respiratory specimens during the acute phase of infection. The lowest  concentration of SARS-CoV-2 viral copies this assay can detect is 250  copies / mL. A negative result does not preclude SARS-CoV-2 infection  and should not be used as the sole basis for treatment or other  patient management decisions.  A negative result may occur with  improper specimen collection / handling, submission of specimen other  than nasopharyngeal swab, presence of viral mutation(s) within the  areas targeted by this assay, and inadequate number of viral copies  (<250 copies / mL). A negative result must be combined with clinical  observations, patient history, and epidemiological information. If result is POSITIVE SARS-CoV-2 target nucleic acids are DETECTED. The SARS-CoV-2 RNA is generally detectable in upper and lower  respiratory specimens dur ing the acute phase of infection.  Positive  results are indicative of active infection with SARS-CoV-2.  Clinical  correlation with patient history and other diagnostic information is  necessary to determine patient infection status.  Positive results do  not rule out bacterial infection or co-infection with other viruses. If result is PRESUMPTIVE POSTIVE SARS-CoV-2 nucleic acids MAY BE PRESENT.   A presumptive positive result was obtained on the submitted specimen  and confirmed on repeat testing.  While 2019 novel  coronavirus  (SARS-CoV-2) nucleic acids may be present in the submitted sample  additional confirmatory testing may be necessary for epidemiological  and / or clinical management purposes  to differentiate between  SARS-CoV-2 and other Sarbecovirus currently known to infect humans.  If clinically indicated additional testing with an alternate test  methodology [phone]) is advised. The SARS-CoV-2 RNA is generally  detectable in upper and lower respiratory sp ecimens during the acute  phase of infection. The expected result is Negative. Fact Sheet for Patients:  StrictlyIdeas.no Fact Sheet for Healthcare Providers: BankingDealers.co.za This test is not yet approved or cleared by the [place] FDA and has been authorized for detection and/or diagnosis of SARS-CoV-2 by FDA under an Emergency Use Authorization (EUA).  This EUA will remain in effect (meaning this test can be used) for the duration of  the COVID-19 declaration under Section 564(b)(1) of the Act, 21 U.S.C. section 360bbb-3(b)(1), unless the authorization is terminated or revoked sooner. Performed at [facility], [address] [place], [place] [address]     RADIOLOGY:  Dg Chest Port 1 View  Result Date: 02/15/2019 CLINICAL DATA:  Shortness of breath EXAM: PORTABLE CHEST 1 VIEW COMPARISON:  02/14/2019, 10/29/2017, 08/09/2016 FINDINGS: Post sternotomy changes. Diffusely increased and slightly reticular interstitial opacity suspect for acute interstitial inflammatory process on underlying chronic disease. Possible patchy pneumonia in the left upper lobe. Enlarged cardiomediastinal silhouette. No pneumothorax. IMPRESSION: 1. Diffusely increased interstitial opacity suspect for acute interstitial edema or inflammatory process superimposed on underlying chronic interstitial disease 2. Possible patchy developing infiltrate in the left upper lobe 3. Cardiomegaly  Electronically Signed   By: Donavan Foil M.D.   On: 02/15/2019 23:34   Dg Chest Portable 1 View  Result Date: 02/14/2019 CLINICAL DATA:  Cough. COPD. Weakness and lightheadedness. EXAM: PORTABLE CHEST 1 VIEW COMPARISON:  10/29/2017 FINDINGS: The heart size and mediastinal contours are within normal limits. Prior CABG again noted. Stable prominence of pulmonary interstitial markings. No evidence of acute infiltrate or edema. No evidence of pleural effusion. The visualized skeletal structures are unremarkable. IMPRESSION: Stable exam. No active disease. Electronically Signed   By: Marlaine Hind M.D.   On: 02/14/2019 21:07    EKG:   Orders placed or performed during the hospital encounter of 02/14/19  . ED EKG  . ED EKG  . EKG 12-Lead  . EKG 12-Lead      Management plans discussed with the patient, family and they are in agreement.  CODE STATUS:     Code Status Orders  (From admission, onward)         Start     Ordered   02/14/19 2331  Full code  Continuous     02/14/19 2330        Code Status History    Date Active Date Inactive Code Status Order ID Comments User Context   10/30/2017 0203 11/01/2017 2259 Full Code [id number]  Lance Coon, MD Inpatient   Advance Care Planning Activity      TOTAL TIME TAKING CARE OF THIS PATIENT: 35 minutes.    Vaughan Basta M.D on 02/16/2019 at 11:36 AM  Between 7am to 6pm - Pager - [phone]  After 6pm go to [email or web address] - password [email or web address] [facility]  Office  [phone]  CC: Primary care physician; Glendon Axe, MD   Note: This dictation was prepared with Dragon dictation along with smaller phrase technology. Any transcriptional errors that result from this process are unintentional.

## 2019-02-16 NOTE — Evaluation (Signed)
Physical Therapy Evaluation Patient Details Name: KHYRON GARNO MRN: [id number] DOB: July 10, 1941 Today's Date: 02/16/2019   History of Present Illness  Pt is a 78 y.o. male presenting to hospital with dizziness, SOB, and generalized weakness; also persistent cough x1 month.  Pt admitted with acute on chronic renal failure, dizziness, hypoglycemia, and hypotension.  PMH includes COPD, htn, CKD, DM, bladder CA, LLQ urostomy, NSTEMI, MRSA.  Clinical Impression  Prior to hospital admission, pt was independent.  Pt lives with his wife on main floor of home; 2 steps to enter.  Currently pt is modified independent with transfers and CGA ambulating 160 feet with RW (pt initially mildly unsteady and holding onto furniture in room for support (no AD) so trialed RW and improved balance and confidence with ambulation noted).  SOB noted with ambulation (O2 sats >92% on room air).  Educated pt on pacing and energy conservation; pt verbalizing appropriate understanding.  Pt would benefit from skilled PT to address noted impairments and functional limitations (see below for any additional details).  Upon hospital discharge, recommend pt discharge with HHPT and support of family.    Follow Up Recommendations Home health PT    Equipment Recommendations  Rolling walker with 5" wheels(pt reports having RW at home already)    Recommendations for Other Services       Precautions / Restrictions Precautions Precautions: Fall Precaution Comments: LLQ urostomy Restrictions Weight Bearing Restrictions: No      Mobility  Bed Mobility               General bed mobility comments: Deferred (pt sitting in chair beginning/end of session)  Transfers Overall transfer level: Modified independent Equipment used: None             General transfer comment: Sit to/from stand and stand step turn recliner to bed with mild increased effort and time; steady  Ambulation/Gait Ambulation/Gait assistance: Min  guard Gait Distance (Feet): 160 Feet Assistive device: Rolling walker (2 wheeled) Gait Pattern/deviations: Step-through pattern Gait velocity: mildly decreased   General Gait Details: pt initially mildly unsteady and preferring to hold onto furniture in room so trialed RW and improved balance and stability and confidence noted; initial cueing for walker use required including staying closer to walker for more upright posture  Stairs            Wheelchair Mobility    Modified Rankin (Stroke Patients Only)       Balance Overall balance assessment: Needs assistance Sitting-balance support: No upper extremity supported;Feet supported Sitting balance-Leahy Scale: Normal Sitting balance - Comments: steady sitting reaching outside BOS   Standing balance support: No upper extremity supported Standing balance-Leahy Scale: Good Standing balance comment: steady standing reaching within BOS                             Pertinent Vitals/Pain Pain Assessment: No/denies pain  Vitals (HR and O2 on room air) stable and WFL throughout treatment session.    Home Living Family/patient expects to be discharged to:: Private residence Living Arrangements: Spouse/significant other Available Help at Discharge: Family Type of Home: House Home Access: Stairs to enter Entrance Stairs-Rails: None(has fence to hold onto if needed) Entrance Stairs-Number of Steps: 2 Home Layout: Able to live on main level with bedroom/bathroom Home Equipment: [place] - single point;Walker - 2 wheels;Walker - 4 wheels      Prior Function Level of Independence: Independent  Comments: Pt reports no falls in past 6 months.     Hand Dominance        Extremity/Trunk Assessment   Upper Extremity Assessment Upper Extremity Assessment: Generalized weakness    Lower Extremity Assessment Lower Extremity Assessment: Generalized weakness    Cervical / Trunk Assessment Cervical / Trunk  Assessment: Normal  Communication   Communication: No difficulties  Cognition Arousal/Alertness: Awake/alert Behavior During Therapy: WFL for tasks assessed/performed Overall Cognitive Status: Within Functional Limits for tasks assessed                                        General Comments   Nursing cleared pt for participation in physical therapy.  Pt agreeable to PT session.    Exercises  Gait training with RW   Assessment/Plan    PT Assessment Patient needs continued PT services  PT Problem List Decreased strength;Decreased activity tolerance;Decreased balance;Decreased mobility;Decreased knowledge of use of DME       PT Treatment Interventions DME instruction;Gait training;Stair training;Functional mobility training;Therapeutic activities;Therapeutic exercise;Balance training;Patient/family education    PT Goals (Current goals can be found in the Care Plan section)  Acute Rehab PT Goals Patient Stated Goal: to go home and improve strength PT Goal Formulation: With patient Time For Goal Achievement: 03/02/19 Potential to Achieve Goals: Good    Frequency Min 2X/week   Barriers to discharge        Co-evaluation               AM-PAC PT "6 Clicks" Mobility  Outcome Measure Help needed turning from your back to your side while in a flat bed without using bedrails?: None Help needed moving from lying on your back to sitting on the side of a flat bed without using bedrails?: None Help needed moving to and from a bed to a chair (including a wheelchair)?: None Help needed standing up from a chair using your arms (e.g., wheelchair or bedside chair)?: None Help needed to walk in hospital room?: A Little Help needed climbing 3-5 steps with a railing? : A Little 6 Click Score: 22    End of Session Equipment Utilized During Treatment: Gait belt(gait belt up high away from LLQ urostomy) Activity Tolerance: Patient tolerated treatment well Patient left:  in chair;with call bell/phone within reach;with chair alarm set Nurse Communication: Mobility status;Precautions PT Visit Diagnosis: Unsteadiness on feet (R26.81);Muscle weakness (generalized) (M62.81);Difficulty in walking, not elsewhere classified (R26.2)    Time: 7414-2395 PT Time Calculation (min) (ACUTE ONLY): 21 min   Charges:   PT Evaluation $PT Eval Low Complexity: 1 Low PT Treatments $Gait Training: 8-22 mins       Leitha Bleak, PT 02/16/19, 12:41 PM [phone]

## 2019-02-16 NOTE — Progress Notes (Signed)
Pt d/c to home via wife. IV removed intact. VSS. Education completed. All belongings sent with pt. All questions answered.

## 2019-02-16 NOTE — Plan of Care (Signed)

## 2019-02-16 NOTE — TOC Transition Note (Signed)
Transition of Care [facility]) - Progression Note    Patient Details  Name: Bryan Brewer MRN: [id number] Date of Birth: 03-Apr-1941  [facility] of [facility]) CM/SW Contact  Yuan Gann, Lenice Llamas Phone Number: [phone]  02/16/2019, 3:35 PM  Clinical Narrative: Clinical Social Worker (CSW) attempted to meet with patient however he had already discharged home. CSW contacted patient via telephone and explained that PT recommended home health. CSW offered to arrange home health for patient. Per patient he does not want home health reported that he does not need it. Patient reported no other needs or concerns. Please reconsult if future social work needs arise. CSW signing off.          Expected Discharge Plan and Services           Expected Discharge Date: 02/16/19                                     Social Determinants of Health (SDOH) Interventions    Readmission Risk Interventions No flowsheet data found.

## 2019-02-18 LAB — URINE CULTURE

## 2019-03-29 ENCOUNTER — Other Ambulatory Visit: Payer: Self-pay

## 2019-03-29 ENCOUNTER — Emergency Department: Payer: Medicare Other

## 2019-03-29 ENCOUNTER — Inpatient Hospital Stay
Admission: EM | Admit: 2019-03-29 | Discharge: 2019-04-04 | DRG: 698 | Disposition: A | Discharge status: Home Health | Payer: Medicare Other | Attending: Internal Medicine | Admitting: Internal Medicine

## 2019-03-29 DIAGNOSIS — Z803 Family history of malignant neoplasm of breast: Secondary | ICD-10-CM

## 2019-03-29 DIAGNOSIS — N136 Pyonephrosis: Secondary | ICD-10-CM | POA: Diagnosis present

## 2019-03-29 DIAGNOSIS — E876 Hypokalemia: Secondary | ICD-10-CM | POA: Diagnosis not present

## 2019-03-29 DIAGNOSIS — Z79899 Other long term (current) drug therapy: Secondary | ICD-10-CM

## 2019-03-29 DIAGNOSIS — A4150 Gram-negative sepsis, unspecified: Secondary | ICD-10-CM | POA: Diagnosis present

## 2019-03-29 DIAGNOSIS — N184 Chronic kidney disease, stage 4 (severe): Secondary | ICD-10-CM | POA: Diagnosis present

## 2019-03-29 DIAGNOSIS — Z7982 Long term (current) use of aspirin: Secondary | ICD-10-CM

## 2019-03-29 DIAGNOSIS — I5022 Chronic systolic (congestive) heart failure: Secondary | ICD-10-CM | POA: Diagnosis present

## 2019-03-29 DIAGNOSIS — I10 Essential (primary) hypertension: Secondary | ICD-10-CM | POA: Diagnosis present

## 2019-03-29 DIAGNOSIS — J440 Chronic obstructive pulmonary disease with acute lower respiratory infection: Secondary | ICD-10-CM | POA: Diagnosis present

## 2019-03-29 DIAGNOSIS — Z20828 Contact with and (suspected) exposure to other viral communicable diseases: Secondary | ICD-10-CM | POA: Diagnosis present

## 2019-03-29 DIAGNOSIS — E11649 Type 2 diabetes mellitus with hypoglycemia without coma: Secondary | ICD-10-CM | POA: Diagnosis present

## 2019-03-29 DIAGNOSIS — N179 Acute kidney failure, unspecified: Secondary | ICD-10-CM | POA: Diagnosis present

## 2019-03-29 DIAGNOSIS — E1122 Type 2 diabetes mellitus with diabetic chronic kidney disease: Secondary | ICD-10-CM | POA: Diagnosis present

## 2019-03-29 DIAGNOSIS — R6521 Severe sepsis with septic shock: Secondary | ICD-10-CM | POA: Diagnosis present

## 2019-03-29 DIAGNOSIS — J9601 Acute respiratory failure with hypoxia: Secondary | ICD-10-CM | POA: Diagnosis present

## 2019-03-29 DIAGNOSIS — J189 Pneumonia, unspecified organism: Secondary | ICD-10-CM | POA: Diagnosis present

## 2019-03-29 DIAGNOSIS — E871 Hypo-osmolality and hyponatremia: Secondary | ICD-10-CM | POA: Diagnosis present

## 2019-03-29 DIAGNOSIS — E872 Acidosis: Secondary | ICD-10-CM | POA: Diagnosis not present

## 2019-03-29 DIAGNOSIS — E119 Type 2 diabetes mellitus without complications: Secondary | ICD-10-CM

## 2019-03-29 DIAGNOSIS — Z87891 Personal history of nicotine dependence: Secondary | ICD-10-CM

## 2019-03-29 DIAGNOSIS — N19 Unspecified kidney failure: Secondary | ICD-10-CM

## 2019-03-29 DIAGNOSIS — N1330 Unspecified hydronephrosis: Secondary | ICD-10-CM | POA: Diagnosis not present

## 2019-03-29 DIAGNOSIS — G9341 Metabolic encephalopathy: Secondary | ICD-10-CM | POA: Diagnosis present

## 2019-03-29 DIAGNOSIS — E785 Hyperlipidemia, unspecified: Secondary | ICD-10-CM | POA: Diagnosis present

## 2019-03-29 DIAGNOSIS — Z8249 Family history of ischemic heart disease and other diseases of the circulatory system: Secondary | ICD-10-CM

## 2019-03-29 DIAGNOSIS — J441 Chronic obstructive pulmonary disease with (acute) exacerbation: Secondary | ICD-10-CM | POA: Diagnosis present

## 2019-03-29 DIAGNOSIS — Z7984 Long term (current) use of oral hypoglycemic drugs: Secondary | ICD-10-CM

## 2019-03-29 DIAGNOSIS — N390 Urinary tract infection, site not specified: Secondary | ICD-10-CM | POA: Diagnosis present

## 2019-03-29 DIAGNOSIS — Z823 Family history of stroke: Secondary | ICD-10-CM

## 2019-03-29 DIAGNOSIS — J984 Other disorders of lung: Secondary | ICD-10-CM

## 2019-03-29 DIAGNOSIS — J449 Chronic obstructive pulmonary disease, unspecified: Secondary | ICD-10-CM | POA: Diagnosis present

## 2019-03-29 DIAGNOSIS — Z936 Other artificial openings of urinary tract status: Secondary | ICD-10-CM

## 2019-03-29 DIAGNOSIS — Z951 Presence of aortocoronary bypass graft: Secondary | ICD-10-CM

## 2019-03-29 DIAGNOSIS — Z9221 Personal history of antineoplastic chemotherapy: Secondary | ICD-10-CM

## 2019-03-29 DIAGNOSIS — T83518A Infection and inflammatory reaction due to other urinary catheter, initial encounter: Secondary | ICD-10-CM | POA: Diagnosis present

## 2019-03-29 DIAGNOSIS — I130 Hypertensive heart and chronic kidney disease with heart failure and stage 1 through stage 4 chronic kidney disease, or unspecified chronic kidney disease: Secondary | ICD-10-CM | POA: Diagnosis present

## 2019-03-29 DIAGNOSIS — E860 Dehydration: Secondary | ICD-10-CM | POA: Diagnosis present

## 2019-03-29 DIAGNOSIS — Y95 Nosocomial condition: Secondary | ICD-10-CM | POA: Diagnosis present

## 2019-03-29 DIAGNOSIS — I959 Hypotension, unspecified: Secondary | ICD-10-CM

## 2019-03-29 DIAGNOSIS — Z8551 Personal history of malignant neoplasm of bladder: Secondary | ICD-10-CM

## 2019-03-29 DIAGNOSIS — L8990 Pressure ulcer of unspecified site, unspecified stage: Secondary | ICD-10-CM | POA: Insufficient documentation

## 2019-03-29 DIAGNOSIS — A419 Sepsis, unspecified organism: Secondary | ICD-10-CM | POA: Diagnosis present

## 2019-03-29 DIAGNOSIS — D631 Anemia in chronic kidney disease: Secondary | ICD-10-CM | POA: Diagnosis present

## 2019-03-29 DIAGNOSIS — E162 Hypoglycemia, unspecified: Secondary | ICD-10-CM | POA: Diagnosis present

## 2019-03-29 LAB — URINALYSIS, COMPLETE (UACMP) WITH MICROSCOPIC
Bilirubin Urine: NEGATIVE
Glucose, UA: NEGATIVE mg/dL
Ketones, ur: NEGATIVE mg/dL
Nitrite: NEGATIVE
Protein, ur: 30 mg/dL — AB
Specific Gravity, Urine: 1.006 (ref 1.005–1.030)
Squamous Epithelial / HPF: NONE SEEN (ref 0–5)
WBC, UA: >50 WBC/hpf — ABNORMAL HIGH (ref 0–5)
pH: 7 (ref 5.0–8.0)

## 2019-03-29 LAB — BASIC METABOLIC PANEL
Anion gap: 15 (ref 5–15)
BUN: 90 mg/dL — ABNORMAL HIGH (ref 8–23)
CO2: 20 mmol/L — ABNORMAL LOW (ref 22–32)
Calcium: 7.5 mg/dL — ABNORMAL LOW (ref 8.9–10.3)
Chloride: 88 mmol/L — ABNORMAL LOW (ref 98–111)
Creatinine, Ser: 4.03 mg/dL — ABNORMAL HIGH (ref 0.61–1.24)
GFR calc Af Amer: 16 mL/min — ABNORMAL LOW (ref >60)
GFR calc non Af Amer: 13 mL/min — ABNORMAL LOW (ref >60)
Glucose, Bld: 155 mg/dL — ABNORMAL HIGH (ref 70–99)
Potassium: 3.8 mmol/L (ref 3.5–5.1)
Sodium: 123 mmol/L — ABNORMAL LOW (ref 135–145)

## 2019-03-29 LAB — CBC WITH DIFFERENTIAL/PLATELET
Abs Immature Granulocytes: 0.08 10*3/uL — ABNORMAL HIGH (ref 0.00–0.07)
Basophils Absolute: 0 10*3/uL (ref 0.0–0.1)
Basophils Relative: 0 %
Eosinophils Absolute: 0 10*3/uL (ref 0.0–0.5)
Eosinophils Relative: 0 %
HCT: 30 % — ABNORMAL LOW (ref 39.0–52.0)
Hemoglobin: 9.5 g/dL — ABNORMAL LOW (ref 13.0–17.0)
Immature Granulocytes: 1 %
Lymphocytes Relative: 1 %
Lymphs Abs: 0.1 10*3/uL — ABNORMAL LOW (ref 0.7–4.0)
MCH: 27.1 pg (ref 26.0–34.0)
MCHC: 31.7 g/dL (ref 30.0–36.0)
MCV: 85.7 fL (ref 80.0–100.0)
Monocytes Absolute: 0.6 10*3/uL (ref 0.1–1.0)
Monocytes Relative: 6 %
Neutro Abs: 9.3 10*3/uL — ABNORMAL HIGH (ref 1.7–7.7)
Neutrophils Relative %: 92 %
Platelets: 199 10*3/uL (ref 150–400)
RBC: 3.5 MIL/uL — ABNORMAL LOW (ref 4.22–5.81)
RDW: 15.4 % (ref 11.5–15.5)
Smear Review: ADEQUATE
WBC: 9.9 10*3/uL (ref 4.0–10.5)
nRBC: 0 % (ref 0.0–0.2)

## 2019-03-29 LAB — TROPONIN I (HIGH SENSITIVITY)
Troponin I (High Sensitivity): 18 ng/L — ABNORMAL HIGH (ref <18)
Troponin I (High Sensitivity): 13 ng/L (ref <18)

## 2019-03-29 LAB — LACTIC ACID, PLASMA: Lactic Acid, Venous: 1.9 mmol/L (ref 0.5–1.9)

## 2019-03-29 LAB — GLUCOSE, CAPILLARY: Glucose-Capillary: 197 mg/dL — ABNORMAL HIGH (ref 70–99)

## 2019-03-29 MED ORDER — SODIUM CHLORIDE 0.9 % IV SOLN
2.0000 g | Freq: Once | INTRAVENOUS | Status: AC
  Administered 2019-03-29: 23:00:00 2 g via INTRAVENOUS
  Filled 2019-03-29: qty 2

## 2019-03-29 MED ORDER — VANCOMYCIN HCL IN DEXTROSE 1-5 GM/200ML-% IV SOLN: 1000.0000 mg | Freq: Once | INTRAVENOUS | Status: DC

## 2019-03-29 MED ORDER — SODIUM CHLORIDE 0.9 % IV SOLN: INTRAVENOUS | Status: DC

## 2019-03-29 MED ORDER — NOREPINEPHRINE BITARTRATE 1 MG/ML IV SOLN
0.0000 ug/min | INTRAVENOUS | Status: DC
  Filled 2019-03-29: qty 4

## 2019-03-29 MED ORDER — VANCOMYCIN HCL 1.5 G IV SOLR
1500.0000 mg | Freq: Once | INTRAVENOUS | Status: AC
  Administered 2019-03-30: 1500 mg via INTRAVENOUS
  Filled 2019-03-29: qty 1500

## 2019-03-29 MED ORDER — NOREPINEPHRINE 4 MG/250ML-% IV SOLN: 0.0000 ug/min | INTRAVENOUS | Status: DC

## 2019-03-29 MED ORDER — SODIUM CHLORIDE 0.9 % IV BOLUS
1000.0000 mL | Freq: Once | INTRAVENOUS | Status: AC
  Administered 2019-03-29: 1000 mL via INTRAVENOUS

## 2019-03-29 MED ORDER — VANCOMYCIN VARIABLE DOSE PER UNSTABLE RENAL FUNCTION (PHARMACIST DOSING): Status: DC

## 2019-03-29 MED ORDER — NOREPINEPHRINE BITARTRATE 1 MG/ML IV SOLN
0.0000 ug/min | INTRAVENOUS | Status: DC
  Administered 2019-03-29: 2 ug/min via INTRAVENOUS
  Administered 2019-03-30: 7 ug/min via INTRAVENOUS
  Filled 2019-03-29 (×3): qty 4

## 2019-03-29 MED ORDER — NOREPINEPHRINE 4 MG/250ML-% IV SOLN
0.0000 ug/min | INTRAVENOUS | Status: DC
  Filled 2019-03-29: qty 250

## 2019-03-29 NOTE — Progress Notes (Signed)
PHARMACY -  BRIEF ANTIBIOTIC NOTE   Pharmacy has received consult(s) for Vancomycin and Cefepime from an ED provider.  The patient's profile has been reviewed for ht/wt/allergies/indication/available labs.    One time order(s) placed for Vancomycin 1500mg  and Cefepime 2gm  Further antibiotics/pharmacy consults should be ordered by admitting physician if indicated.                       Thank you, Hart Robinsons A 03/29/2019  10:50 PM

## 2019-03-29 NOTE — ED Notes (Signed)
EKG completed

## 2019-03-29 NOTE — ED Notes (Addendum)
Blue/grn/lav tubes sent to lab. Will send urine sample once fresh sample produced. Pt states urine in bag had been there for hours.

## 2019-03-29 NOTE — ED Notes (Signed)
EDP Siadecki notified urine sample will be pulled from urostomy once fresh sample available.

## 2019-03-29 NOTE — ED Triage Notes (Signed)
Pt in via EMS from home d/t hypoglycemia. Pt had been "fatigued" all day with wife and became very lethargic and diaphoretic. Wife called EMS. BG 41 with EMS. EMS gave 1mg  glucagon and 250cc D10. EMS then gave pt peanut butter crackers and OJ. BG 221 2nd time with EMS. Pt 89% RA with EMS then 96% on 2L via [place].

## 2019-03-29 NOTE — ED Notes (Addendum)
Pt has urostomy on LL abdomen. Pouch changed every 5 days per pt. Pt states he changed 2 days ago. Fluid in bag straw yellow. Emptied as bag was close to full.

## 2019-03-29 NOTE — H&P (Signed)
[place] [facility] NAME: Bryan Brewer    MR#:  [id number]  DATE OF BIRTH:  1941/06/08  DATE OF ADMISSION:  03/29/2019  PRIMARY CARE PHYSICIAN: Glendon Axe, MD   REQUESTING/REFERRING PHYSICIAN: Cherylann Banas, MD  CHIEF COMPLAINT:   Chief Complaint  Patient presents with  . Hypoglycemia    HISTORY OF PRESENT ILLNESS:  Bryan Brewer  is a 78 y.o. male who presents with chief complaint as above.  Patient presents to the ED after 3 to 4 days of malaise with some cough.  He states that he has been feeling bad, but that today he got much worse.  His wife was at bedside states that he was significantly confused today.  He had several episodes of chills.  When EMS arrived at the house to evaluate him his blood sugar was pretty low, in the 40s.  He was given dextrose and glucagon and his blood glucose improved.  On evaluation here in the ED he is found to have pneumonia on imaging, and likely UTI based on urinalysis.  He meets sepsis criteria with tachycardia and bandemia.  His blood pressure has been low, despite volume resuscitation he required initiation of Levophed.  Hospitalist were called for admission  PAST MEDICAL HISTORY:   Past Medical History:  Diagnosis Date  . Bladder cancer ([place])   . COPD (chronic obstructive pulmonary disease) ([place])   . Hyperlipidemia   . Hypertension   . Kidney disease   . Type 2 diabetes mellitus ([place])      PAST SURGICAL HISTORY:   Past Surgical History:  Procedure Laterality Date  . LEFT HEART CATH AND CORONARY ANGIOGRAPHY N/A 10/31/2017   Procedure: LEFT HEART CATH AND CORONARY ANGIOGRAPHY;  Surgeon: Yolonda Kida, MD;  Location: [facility];  Service: Cardiovascular;  Laterality: N/A;  . REVISION UROSTOMY CUTANEOUS       SOCIAL HISTORY:   Social History   Tobacco Use  . Smoking status: Former Smoker    Packs/day: 1.50    Years: 35.00    Pack years: 52.50    Types: Cigarettes     Quit date: 2013    Years since quitting: 7.6  . Smokeless tobacco: Never Used  Substance Use Topics  . Alcohol use: No    Frequency: Never     FAMILY HISTORY:   Family History  Problem Relation Age of Onset  . CAD Mother   . Stroke Mother   . CAD Father   . Stroke Father   . Breast cancer Sister      DRUG ALLERGIES:  No Known Allergies  MEDICATIONS AT HOME:   Prior to Admission medications   Medication Sig Start Date End Date Taking? Authorizing Provider  aspirin EC 81 MG tablet Take 81 mg by mouth daily.    [provider]  atorvastatin (LIPITOR) 80 MG tablet Take 80 mg by mouth at bedtime.    [provider]  glimepiride (AMARYL) 2 MG tablet Take 2 mg by mouth daily with breakfast.    [provider]  hydrALAZINE (APRESOLINE) 25 MG tablet Take 25 mg by mouth 3 (three) times daily.    [provider]  isosorbide mononitrate (IMDUR) 30 MG 24 hr tablet Take 30 mg by mouth daily.    [provider]  metoprolol tartrate (LOPRESSOR) 50 MG tablet Take 50 mg by mouth 2 (two) times daily.    [provider]  sodium bicarbonate 650 MG tablet  Take 1,300 mg by mouth 3 (three) times daily.    [provider]  spironolactone (ALDACTONE) 25 MG tablet Take 12.5 mg by mouth daily.    [provider]    REVIEW OF SYSTEMS:  Review of Systems  Constitutional: Positive for chills and malaise/fatigue. Negative for fever and weight loss.  HENT: Negative for ear pain, hearing loss and tinnitus.   Eyes: Negative for blurred vision, double vision, pain and redness.  Respiratory: Positive for cough. Negative for hemoptysis and shortness of breath.   Cardiovascular: Negative for chest pain, palpitations, orthopnea and leg swelling.  Gastrointestinal: Positive for diarrhea. Negative for abdominal pain, constipation, nausea and vomiting.  Genitourinary: Negative for dysuria, frequency and hematuria.       Hesitancy   Musculoskeletal: Negative for back pain, joint pain and neck pain.  Skin:       No acne, rash, or lesions  Neurological: Negative for dizziness, tremors, focal weakness and weakness.  Endo/Heme/Allergies: Negative for polydipsia. Does not bruise/bleed easily.  Psychiatric/Behavioral: Negative for depression. The patient is not nervous/anxious and does not have insomnia.      VITAL SIGNS:   Vitals:   03/29/19 2115 03/29/19 2130 03/29/19 2149 03/29/19 2150  BP: (!) 78/45 (!) 75/45 (!) 73/45   Pulse:   89   Resp: 12 12 19    SpO2:    95%  Weight:      Height:       Wt Readings from Last 3 Encounters:  03/29/19 72.6 kg  02/15/19 73.4 kg  11/01/17 72.5 kg    PHYSICAL EXAMINATION:  Physical Exam  Vitals reviewed. Constitutional: He is oriented to person, place, and time. He appears well-developed and well-nourished. No distress.  HENT:  Head: Normocephalic and atraumatic.  Mouth/Throat: Oropharynx is clear and moist.  Eyes: Pupils are equal, round, and reactive to light. Conjunctivae and EOM are normal. No scleral icterus.  Neck: Normal range of motion. Neck supple. No JVD present. No thyromegaly present.  Cardiovascular: Regular rhythm and intact distal pulses. Exam reveals no gallop and no friction rub.  No murmur heard. Borderline tachycardic  Respiratory: Effort normal. No respiratory distress. He has no wheezes. He has no rales.  Bilateral coarse breath sounds  GI: Soft. Bowel sounds are normal. He exhibits no distension. There is no abdominal tenderness.  Musculoskeletal: Normal range of motion.        General: No edema.     Comments: No arthritis, no gout  Lymphadenopathy:    He has no cervical adenopathy.  Neurological: He is alert and oriented to person, place, and time. No cranial nerve deficit.  No dysarthria, no aphasia  Skin: Skin is warm and dry. No rash noted. No erythema.  Psychiatric: He has a normal mood and affect. His behavior is normal. Judgment and  thought content normal.    LABORATORY PANEL:   CBC Recent Labs  Lab 03/29/19 2013  WBC 9.9  HGB 9.5*  HCT 30.0*  PLT 199   ------------------------------------------------------------------------------------------------------------------  Chemistries  Recent Labs  Lab 03/29/19 2013  NA 123*  K 3.8  CL 88*  CO2 20*  GLUCOSE 155*  BUN 90*  CREATININE 4.03*  CALCIUM 7.5*   ------------------------------------------------------------------------------------------------------------------  Cardiac Enzymes No results for input(s): TROPONINI in the last 168 hours. ------------------------------------------------------------------------------------------------------------------  RADIOLOGY:  Dg Chest Portable 1 View  Result Date: 03/29/2019 CLINICAL DATA:  Weakness EXAM: PORTABLE CHEST 1 VIEW COMPARISON:  02/15/2019, 10/30/2017, 08/09/2016 FINDINGS: Post sternotomy changes. Cardiomegaly. Coarse bilateral interstitial opacity,  suspect for chronic interstitial change. Streaky superimposed opacity in the left mid and lower lung. Aortic atherosclerosis. No pneumothorax. IMPRESSION: 1. Cardiomegaly. 2. Streaky left mid and lower lung opacities, suspect for acute infectious or inflammatory process superimposed on underlying chronic disease. Electronically Signed   By: Donavan Foil M.D.   On: 03/29/2019 21:16    EKG:   Orders placed or performed during the hospital encounter of 03/29/19  . EKG 12-Lead  . EKG 12-Lead  . EKG 12-Lead  . EKG 12-Lead    IMPRESSION AND PLAN:  Principal Problem:   Severe sepsis with septic shock ([place]) -suspect that part of the patient's hypotension is due to poor p.o. intake for several days and significant dehydration.  IV fluid resuscitation given in the ED.  Patient required Levophed initiation despite this.  Continue IV fluids in addition to the Levophed.  Lactic acid was within normal limits.  IV antibiotics given.  Cultures sent Active Problems:    Acute on chronic renal failure (HCC) -due to sepsis, dehydration.  IV fluids as above.  Avoid nephrotoxins and monitor.   HCAP (healthcare-associated pneumonia) -IV antibiotics, supportive treatment   UTI (urinary tract infection) -IV antibiotics, urine culture as above   Hyponatremia -IV fluids as above   Diabetes (HCC) -sliding scale insulin coverage   HTN (hypertension) -hold antihypertensives as the patient is currently hypotensive on pressors   COPD (chronic obstructive pulmonary disease) ([place]) -continue home meds   HLD (hyperlipidemia) -home dose antilipid  Chart review performed and case discussed with ED provider. Labs, imaging and/or ECG reviewed by provider and discussed with patient/family. Management plans discussed with the patient and/or family.  COVID-19 status: Pending  DVT PROPHYLAXIS: SubQ heparin  GI PROPHYLAXIS:  None  ADMISSION STATUS: Inpatient     CODE STATUS: Full Code Status History    Date Active Date Inactive Code Status Order ID Comments User Context   02/14/2019 2330 02/16/2019 1909 Full Code [id number]  Henreitta Leber, MD Inpatient   10/30/2017 0203 11/01/2017 2259 Full Code [id number]  Lance Coon, MD Inpatient   Advance Care Planning Activity      TOTAL CRITICAL CARE TIME TAKING CARE OF THIS PATIENT: 50 minutes.   This patient was evaluated in the context of the global COVID-19 pandemic, which necessitated consideration that the patient might be at risk for infection with the SARS-CoV-2 virus that causes COVID-19. Institutional protocols and algorithms that pertain to the evaluation of patients at risk for COVID-19 are in a state of rapid change based on information released by regulatory bodies including the CDC and federal and state organizations. These policies and algorithms were followed to the best of this provider's knowledge to date during the patient's care at this facility.  Ethlyn Daniels 03/29/2019, 11:43 PM  [facility]   Office  [phone]  CC: Primary care physician; Glendon Axe, MD  Note:  This document was prepared using Dragon voice recognition software and may include unintentional dictation errors.

## 2019-03-29 NOTE — ED Notes (Signed)
Pharm called as pyxis out of Levophed.

## 2019-03-29 NOTE — ED Notes (Signed)
Family at bedside with pt.

## 2019-03-29 NOTE — ED Provider Notes (Signed)
[facility] Emergency Department Provider Note ____________________________________________   First MD Initiated Contact with Patient 03/29/19 2016     (approximate)  I have reviewed the triage vital signs and the nursing notes.   HISTORY  Chief Complaint Hypoglycemia    HPI Bryan Brewer is a 77 y.o. male with PMH as noted below who presents with hyperglycemia.  The patient states that he took his glimepiride earlier today and did not eat.  He states that he does not remember exactly what happened but states that he remembers feeling confused.  Per EMS, on their arrival his initial blood sugar was in the 40s and he was given glucagon and dextrose.  The patient states that he feels generally weak but denies any other acute symptoms.  Past Medical History:  Diagnosis Date  . Bladder cancer ([place])   . COPD (chronic obstructive pulmonary disease) ([place])   . Hyperlipidemia   . Hypertension   . Kidney disease   . Type 2 diabetes mellitus [facility])     Patient Active Problem List   Diagnosis Date Noted  . HCAP (healthcare-associated pneumonia) 03/29/2019  . UTI (urinary tract infection) 03/29/2019  . Severe sepsis with septic shock ([place]) 03/29/2019  . Hyponatremia 03/29/2019  . Acute on chronic renal failure ([place]) 02/14/2019  . NSTEMI (non-ST elevated myocardial infarction) ([place]) 10/30/2017  . Diabetes ([place]) 10/30/2017  . CKD (chronic kidney disease), stage IV ([place]) 10/30/2017  . HTN (hypertension) 10/30/2017  . HLD (hyperlipidemia) 10/30/2017  . COPD (chronic obstructive pulmonary disease) ([place]) 10/30/2017    Past Surgical History:  Procedure Laterality Date  . LEFT HEART CATH AND CORONARY ANGIOGRAPHY N/A 10/31/2017   Procedure: LEFT HEART CATH AND CORONARY ANGIOGRAPHY;  Surgeon: Yolonda Kida, MD;  Location: [facility];  Service: Cardiovascular;  Laterality: N/A;  . REVISION UROSTOMY CUTANEOUS      Prior to Admission medications    Medication Sig Start Date End Date Taking? Authorizing Provider  atorvastatin (LIPITOR) 80 MG tablet Take 80 mg by mouth at bedtime.   Yes [provider]  metoprolol tartrate (LOPRESSOR) 50 MG tablet Take 50 mg by mouth 2 (two) times daily.   Yes [provider]  sodium bicarbonate 650 MG tablet Take 1,300 mg by mouth 3 (three) times daily.   Yes [provider]  aspirin EC 81 MG tablet Take 81 mg by mouth daily.    [provider]  glimepiride (AMARYL) 2 MG tablet Take 2 mg by mouth daily with breakfast.    [provider]  hydrALAZINE (APRESOLINE) 25 MG tablet Take 25 mg by mouth 3 (three) times daily.    [provider]  isosorbide mononitrate (IMDUR) 30 MG 24 hr tablet Take 30 mg by mouth daily.    [provider]  spironolactone (ALDACTONE) 25 MG tablet Take 12.5 mg by mouth daily.    [provider]    Allergies Patient has no known allergies.  Family History  Problem Relation Age of Onset  . CAD Mother   . Stroke Mother   . CAD Father   . Stroke Father   . Breast cancer Sister     Social History Social History   Tobacco Use  . Smoking status: Former Smoker    Packs/day: 1.50    Years: 35.00    Pack years: 52.50    Types: Cigarettes    Quit date: 2013    Years since quitting: 7.6  . Smokeless tobacco: Never  Used  Substance Use Topics  . Alcohol use: No    Frequency: Never  . Drug use: Never    Review of Systems  Constitutional: No fever.  Positive for fatigue Eyes: No redness. ENT: No sore throat. Cardiovascular: Denies chest pain. Respiratory: Denies shortness of breath.  Positive for cough. Gastrointestinal: No vomiting or diarrhea.  Genitourinary: Negative for dysuria.  Musculoskeletal: Negative for back pain. Skin: Negative for rash. Neurological: Negative for headache.   ____________________________________________   PHYSICAL EXAM:  VITAL SIGNS: ED Triage Vitals  Enc  Vitals Group     BP 03/29/19 2002 (!) 94/50     Pulse Rate 03/29/19 2002 100     Resp 03/29/19 2004 17     Temp --      Temp src --      SpO2 03/29/19 2002 96 %     Weight 03/29/19 2003 160 lb (72.6 kg)     Height 03/29/19 2003 5\' 7"  (1.702 m)     Head Circumference --      Peak Flow --      Pain Score 03/29/19 2003 0     Pain Loc --      Pain Edu? --      Excl. in [place]? --     Constitutional: Alert and oriented.  Slightly weak appearing but in no acute distress. Eyes: Conjunctivae are normal.  Head: Atraumatic. Nose: No congestion/rhinnorhea. Mouth/Throat: Mucous membranes are moist.   Neck: Normal range of motion.  Cardiovascular: Normal rate, regular rhythm. Good peripheral circulation. Respiratory: Normal respiratory effort.  No retractions. Gastrointestinal: Soft and nontender. No distention.  Genitourinary: No flank tenderness. Musculoskeletal: Extremities warm and well perfused.  Neurologic:  Normal speech and language. No gross focal neurologic deficits are appreciated.  Skin:  Skin is warm and dry. No rash noted. Psychiatric: Mood and affect are normal. Speech and behavior are normal.  ____________________________________________   LABS (all labs ordered are listed, but only abnormal results are displayed)  Labs Reviewed  BASIC METABOLIC PANEL - Abnormal; Notable for the following components:      Result Value   Sodium 123 (*)    Chloride 88 (*)    CO2 20 (*)    Glucose, Bld 155 (*)    BUN 90 (*)    Creatinine, Ser 4.03 (*)    Calcium 7.5 (*)    GFR calc non Af Amer 13 (*)    GFR calc Af Amer 16 (*)    All other components within normal limits  CBC WITH DIFFERENTIAL/PLATELET - Abnormal; Notable for the following components:   RBC 3.50 (*)    Hemoglobin 9.5 (*)    HCT 30.0 (*)    Neutro Abs 9.3 (*)    Lymphs Abs 0.1 (*)    Abs Immature Granulocytes 0.08 (*)    All other components within normal limits  URINALYSIS, COMPLETE (UACMP) WITH MICROSCOPIC -  Abnormal; Notable for the following components:   Color, Urine YELLOW (*)    APPearance CLOUDY (*)    Hgb urine dipstick MODERATE (*)    Protein, ur 30 (*)    Leukocytes,Ua LARGE (*)    WBC, UA >50 (*)    Bacteria, UA MANY (*)    All other components within normal limits  GLUCOSE, CAPILLARY - Abnormal; Notable for the following components:   Glucose-Capillary 197 (*)    All other components within normal limits  TROPONIN I (HIGH SENSITIVITY) - Abnormal; Notable for the following components:  Troponin I (High Sensitivity) 18 (*)    All other components within normal limits  NOVEL CORONAVIRUS, NAA (HOSPITAL ORDER, SEND-OUT TO REF LAB)  LACTIC ACID, PLASMA  TROPONIN I (HIGH SENSITIVITY)   ____________________________________________  EKG  ED ECG REPORT I, Arta Silence, the attending physician, personally viewed and interpreted this ECG.  Date: 03/29/2019 EKG Time: 2004 Rate: 91 Rhythm: Atrial fibrillation QRS Axis: normal Intervals: Prolonged QTc ST/T Wave abnormalities: Nonspecific ST abnormality Narrative Interpretation: Nonspecific abnormalities with no evidence of acute ischemia  ____________________________________________  RADIOLOGY  CXR: Left mid and lower lung opacities concerning for pneumonia  ____________________________________________   PROCEDURES  Procedure(s) performed: No  Procedures  Critical Care performed: Yes  CRITICAL CARE Performed by: Arta Silence   Total critical care time: 60 minutes  Critical care time was exclusive of separately billable procedures and treating other patients.  Critical care was necessary to treat or prevent imminent or life-threatening deterioration.  Critical care was time spent personally by me on the following activities: development of treatment plan with patient and/or surrogate as well as nursing, discussions with consultants, evaluation of patient's response to treatment, examination of  patient, obtaining history from patient or surrogate, ordering and performing treatments and interventions, ordering and review of laboratory studies, ordering and review of radiographic studies, pulse oximetry and re-evaluation of patient's condition. ____________________________________________   INITIAL IMPRESSION / ASSESSMENT AND PLAN / ED COURSE  Pertinent labs & imaging results that were available during my care of the patient were reviewed by me and considered in my medical decision making (see chart for details).  78 year old male with PMH as noted above presents with an episode of hypoglycemia as well as increased generalized weakness over the last several days and some cough.  On exam the patient is somewhat weak appearing but in no acute distress.  He is hypotensive with otherwise normal vital signs.  The remainder of the exam is as described above.  Overall presentation is concerning for infection/sepsis given the patient's hypotension and generalized weakness.  I suspect that the hypoglycemia is likely from taking glimepiride without having eaten today.  The patient does appear somewhat dehydrated.  We will give a fluid bolus, obtain lab work-up including chest x-ray and UA, and reassess.  I anticipate likely admission.  ----------------------------------------- 12:26 AM on 03/30/2019 -----------------------------------------  Chest x-ray is consistent with pneumonia.  The patient's urine also is consistent with urinalysis.  The blood pressure remained low with a borderline to low M AP despite fluid resuscitation.  However, the patient's lactic acid is not elevated.  I initiated a peripheral Levophed drip to help with the blood pressure.  However, the patient has remained awake and alert throughout.  Broad-spectrum antibiotics to cover for HCAP and UTI have been administered.  The patient will require admission.  I signed him out to the hospitalist Dr. Jannifer Franklin.   __________________________  Bryan Brewer was evaluated in Emergency Department on 03/30/2019 for the symptoms described in the history of present illness. He was evaluated in the context of the global COVID-19 pandemic, which necessitated consideration that the patient might be at risk for infection with the SARS-CoV-2 virus that causes COVID-19. Institutional protocols and algorithms that pertain to the evaluation of patients at risk for COVID-19 are in a state of rapid change based on information released by regulatory bodies including the CDC and federal and state organizations. These policies and algorithms were followed during the patient's care in the ED. ____________________________________________   FINAL CLINICAL IMPRESSION(S) /  ED DIAGNOSES  Final diagnoses:  Healthcare-associated pneumonia  Sepsis, due to unspecified organism, unspecified whether acute organ dysfunction present ([place])  Hypotension, unspecified hypotension type      NEW MEDICATIONS STARTED DURING THIS VISIT:  New Prescriptions   No medications on file     Note:  This document was prepared using Dragon voice recognition software and may include unintentional dictation errors.    Arta Silence, MD 03/30/19 Shelah Lewandowsky

## 2019-03-29 NOTE — ED Notes (Signed)
Heather Rn assisting this RN to obtain 2nd IV line. R wrist EMS IV line malpositioned and wouldn't flush. Removed. Site C/D/I.

## 2019-03-29 NOTE — ED Notes (Addendum)
Pt sleeping upon entrance to room. Easily woken. Pt denied dizziness earlier tonight but states is "a little dizzy" now. BP remains soft.

## 2019-03-29 NOTE — ED Notes (Signed)
Pressure bag applied to bolus. 500cc of bolus left. Pt sleeping.

## 2019-03-30 LAB — COMPREHENSIVE METABOLIC PANEL
ALT: 14 U/L (ref 0–44)
AST: 26 U/L (ref 15–41)
Albumin: 2.1 g/dL — ABNORMAL LOW (ref 3.5–5.0)
Alkaline Phosphatase: 55 U/L (ref 38–126)
Anion gap: 12 (ref 5–15)
BUN: 78 mg/dL — ABNORMAL HIGH (ref 8–23)
CO2: 19 mmol/L — ABNORMAL LOW (ref 22–32)
Calcium: 7.1 mg/dL — ABNORMAL LOW (ref 8.9–10.3)
Chloride: 97 mmol/L — ABNORMAL LOW (ref 98–111)
Creatinine, Ser: 3.57 mg/dL — ABNORMAL HIGH (ref 0.61–1.24)
GFR calc Af Amer: 18 mL/min — ABNORMAL LOW (ref >60)
GFR calc non Af Amer: 16 mL/min — ABNORMAL LOW (ref >60)
Glucose, Bld: 93 mg/dL (ref 70–99)
Potassium: 3 mmol/L — ABNORMAL LOW (ref 3.5–5.1)
Sodium: 128 mmol/L — ABNORMAL LOW (ref 135–145)
Total Bilirubin: 1 mg/dL (ref 0.3–1.2)
Total Protein: 6 g/dL — ABNORMAL LOW (ref 6.5–8.1)

## 2019-03-30 LAB — GLUCOSE, CAPILLARY
Glucose-Capillary: 136 mg/dL — ABNORMAL HIGH (ref 70–99)
Glucose-Capillary: 145 mg/dL — ABNORMAL HIGH (ref 70–99)
Glucose-Capillary: 40 mg/dL — CL (ref 70–99)
Glucose-Capillary: 46 mg/dL — ABNORMAL LOW (ref 70–99)
Glucose-Capillary: 72 mg/dL (ref 70–99)
Glucose-Capillary: 75 mg/dL (ref 70–99)
Glucose-Capillary: 82 mg/dL (ref 70–99)
Glucose-Capillary: 84 mg/dL (ref 70–99)
Glucose-Capillary: 92 mg/dL (ref 70–99)

## 2019-03-30 LAB — CBC
HCT: 31.1 % — ABNORMAL LOW (ref 39.0–52.0)
Hemoglobin: 10.1 g/dL — ABNORMAL LOW (ref 13.0–17.0)
MCH: 27.2 pg (ref 26.0–34.0)
MCHC: 32.5 g/dL (ref 30.0–36.0)
MCV: 83.6 fL (ref 80.0–100.0)
Platelets: 240 10*3/uL (ref 150–400)
RBC: 3.72 MIL/uL — ABNORMAL LOW (ref 4.22–5.81)
RDW: 15.2 % (ref 11.5–15.5)
WBC: 10.5 10*3/uL (ref 4.0–10.5)
nRBC: 0 % (ref 0.0–0.2)

## 2019-03-30 LAB — PHOSPHORUS: Phosphorus: 5.2 mg/dL — ABNORMAL HIGH (ref 2.5–4.6)

## 2019-03-30 LAB — LACTIC ACID, PLASMA: Lactic Acid, Venous: 1.2 mmol/L (ref 0.5–1.9)

## 2019-03-30 LAB — PROTIME-INR
INR: 1.3 — ABNORMAL HIGH (ref 0.8–1.2)
Prothrombin Time: 15.6 seconds — ABNORMAL HIGH (ref 11.4–15.2)

## 2019-03-30 LAB — MAGNESIUM: Magnesium: 1.7 mg/dL (ref 1.7–2.4)

## 2019-03-30 LAB — VANCOMYCIN, RANDOM: Vancomycin Rm: 19

## 2019-03-30 LAB — MRSA PCR SCREENING: MRSA by PCR: POSITIVE — AB

## 2019-03-30 MED ORDER — INSULIN ASPART 100 UNIT/ML ~~LOC~~ SOLN
0.0000 [IU] | SUBCUTANEOUS | Status: DC
  Administered 2019-03-30: 1 [IU] via SUBCUTANEOUS
  Administered 2019-03-31 (×2): 9 [IU] via SUBCUTANEOUS
  Administered 2019-04-01: 7 [IU] via SUBCUTANEOUS
  Administered 2019-04-01: 3 [IU] via SUBCUTANEOUS
  Administered 2019-04-01: 2 [IU] via SUBCUTANEOUS
  Administered 2019-04-01: 7 [IU] via SUBCUTANEOUS
  Filled 2019-03-30 (×10): qty 1

## 2019-03-30 MED ORDER — DEXTROSE-NACL 5-0.45 % IV SOLN
INTRAVENOUS | Status: DC
  Administered 2019-03-30: 02:00:00 via INTRAVENOUS

## 2019-03-30 MED ORDER — ACETAMINOPHEN 325 MG PO TABS: 650.0000 mg | ORAL_TABLET | ORAL | Status: DC | PRN

## 2019-03-30 MED ORDER — ATORVASTATIN CALCIUM 20 MG PO TABS
80.0000 mg | ORAL_TABLET | Freq: Every day | ORAL | Status: DC
  Administered 2019-03-30 – 2019-04-03 (×5): 80 mg via ORAL
  Filled 2019-03-30 (×5): qty 4

## 2019-03-30 MED ORDER — ONDANSETRON HCL 4 MG/2ML IJ SOLN: 4.0000 mg | Freq: Four times a day (QID) | INTRAMUSCULAR | Status: DC | PRN

## 2019-03-30 MED ORDER — ASPIRIN EC 81 MG PO TBEC
81.0000 mg | DELAYED_RELEASE_TABLET | Freq: Every day | ORAL | Status: DC
  Administered 2019-03-30 – 2019-04-04 (×6): 81 mg via ORAL
  Filled 2019-03-30 (×6): qty 1

## 2019-03-30 MED ORDER — DEXTROSE 50 % IV SOLN
INTRAVENOUS | Status: AC
  Administered 2019-03-30: 50 mL
  Filled 2019-03-30: qty 50

## 2019-03-30 MED ORDER — POTASSIUM CHLORIDE 10 MEQ/100ML IV SOLN
10.0000 meq | INTRAVENOUS | Status: AC
  Administered 2019-03-30 (×2): 10 meq via INTRAVENOUS
  Filled 2019-03-30 (×2): qty 100

## 2019-03-30 MED ORDER — SODIUM CHLORIDE 0.9 % IV SOLN: 250.0000 mL | INTRAVENOUS | Status: DC

## 2019-03-30 MED ORDER — LACTATED RINGERS IV BOLUS
1000.0000 mL | Freq: Once | INTRAVENOUS | Status: AC
  Administered 2019-03-30: 1000 mL via INTRAVENOUS

## 2019-03-30 MED ORDER — POTASSIUM CHLORIDE 20 MEQ PO PACK: 40.0000 meq | PACK | Freq: Once | ORAL | Status: DC

## 2019-03-30 MED ORDER — SODIUM BICARBONATE 650 MG PO TABS
1300.0000 mg | ORAL_TABLET | Freq: Three times a day (TID) | ORAL | Status: DC
  Administered 2019-03-30 – 2019-04-04 (×16): 1300 mg via ORAL
  Filled 2019-03-30 (×18): qty 2

## 2019-03-30 MED ORDER — LACTATED RINGERS IV SOLN
INTRAVENOUS | Status: AC
  Administered 2019-03-30: 12:00:00 via INTRAVENOUS

## 2019-03-30 MED ORDER — SODIUM CHLORIDE 0.9 % IV SOLN
2.0000 g | INTRAVENOUS | Status: DC
  Administered 2019-03-30 – 2019-04-01 (×3): 2 g via INTRAVENOUS
  Filled 2019-03-30 (×4): qty 2

## 2019-03-30 MED ORDER — CHLORHEXIDINE GLUCONATE CLOTH 2 % EX PADS
6.0000 | MEDICATED_PAD | Freq: Every day | CUTANEOUS | Status: DC
  Administered 2019-03-30 – 2019-04-02 (×3): 6 via TOPICAL

## 2019-03-30 MED ORDER — HEPARIN SODIUM (PORCINE) 5000 UNIT/ML IJ SOLN
5000.0000 [IU] | Freq: Three times a day (TID) | INTRAMUSCULAR | Status: DC
  Administered 2019-03-30 – 2019-04-04 (×15): 5000 [IU] via SUBCUTANEOUS
  Filled 2019-03-30 (×15): qty 1

## 2019-03-30 MED ORDER — POTASSIUM CHLORIDE 10 MEQ/100ML IV SOLN
10.0000 meq | INTRAVENOUS | Status: AC
  Administered 2019-03-30 (×4): 10 meq via INTRAVENOUS
  Filled 2019-03-30 (×4): qty 100

## 2019-03-30 NOTE — ED Notes (Signed)
Heather RN attempted for IV x2. Ena Dawley RN to use [place] to place IV. Levophed continued through L fa IV. Vanc to be hung once 2nd IV line obtained.

## 2019-03-30 NOTE — ED Notes (Signed)
18g IV placed by Ena Dawley. Levophed switched to 18g.

## 2019-03-30 NOTE — Progress Notes (Signed)
Hypoglycemic Event  CBG: 40  Treatment: D50 IV adm. @0128   Symptoms: No symptoms   Follow-up CBG: Time:0145 CBG Result:136   Comments/MD notified: Jeoffrey Massed., NP     Sharion Balloon

## 2019-03-30 NOTE — ED Notes (Signed)
Per micromedex, levophed and vanc compatible. Both running through L fa IV.

## 2019-03-30 NOTE — Consult Note (Addendum)
PULMONARY / CRITICAL CARE MEDICINE  Name: Bryan Brewer MRN: [id number] DOB: August 20, 1940    LOS: 1  Referring Provider:  Dr. Jannifer Franklin Reason for Referral:  Sepsis and septic shock Brief patient description: 78 year old male presenting sepsis secondary to pneumonia and UTI, now in septic shock requiring pressors.  HPI: This is a 78 year old male with a medical history as indicated below, history of bladder cancer with urostomy presented to the ED via EMS with hypoglycemia.  His blood glucose when EMS arrived was 41 mg/dL.  He was treated in the field with improvement in his blood sugar glucose to 221 mg/dL.  He was also hypoxic with an SPO2 of 89% on room air.  He was placed on 2 L nasal cannula.  His ED work-up was significant for a creatinine of 4.03 up from his baseline of 2.79, left middle and lower lobe opacities on chest x-ray suggestive of pneumonia and an abnormal urinalysis.  He was started on the sepsis protocol but despite fluid resuscitation he remained hypotensive with systolic blood pressures in the 70s.  He was started on norepinephrine and is being admitted to the ICU for further management.  Past Medical History:  Diagnosis Date  . Bladder cancer ([place])   . COPD (chronic obstructive pulmonary disease) ([place])   . Hyperlipidemia   . Hypertension   . Kidney disease   . Type 2 diabetes mellitus ([place])    Past Surgical History:  Procedure Laterality Date  . LEFT HEART CATH AND CORONARY ANGIOGRAPHY N/A 10/31/2017   Procedure: LEFT HEART CATH AND CORONARY ANGIOGRAPHY;  Surgeon: Yolonda Kida, MD;  Location: [facility];  Service: Cardiovascular;  Laterality: N/A;  . REVISION UROSTOMY CUTANEOUS     No current facility-administered medications on file prior to encounter.    Current Outpatient Medications on File Prior to Encounter  Medication Sig  . atorvastatin (LIPITOR) 80 MG tablet Take 80 mg by mouth at bedtime.  . furosemide (LASIX) 80 MG tablet Take 80 mg by  mouth daily.  Marland Kitchen glimepiride (AMARYL) 2 MG tablet Take 2 mg by mouth daily with breakfast.  . hydrALAZINE (APRESOLINE) 50 MG tablet Take 50 mg by mouth 3 (three) times daily.   . isosorbide mononitrate (IMDUR) 60 MG 24 hr tablet Take 60 mg by mouth daily.   . metoprolol tartrate (LOPRESSOR) 50 MG tablet Take 50 mg by mouth 2 (two) times daily.  . sodium bicarbonate 650 MG tablet Take 1,300 mg by mouth 3 (three) times daily.  Marland Kitchen spironolactone (ALDACTONE) 25 MG tablet Take 12.5 mg by mouth daily.  Marland Kitchen aspirin EC 81 MG tablet Take 81 mg by mouth daily.    Allergies No Known Allergies  Family History Family History  Problem Relation Age of Onset  . CAD Mother   . Stroke Mother   . CAD Father   . Stroke Father   . Breast cancer Sister    Social History  reports that he quit smoking about 7 years ago. His smoking use included cigarettes. He has a 52.50 pack-year smoking history. He has never used smokeless tobacco. He reports that he does not drink alcohol or use drugs.  Review Of Systems:   Constitutional: Positive for chills and fatigue HENT: Negative for congestion and rhinorrhea.  Eyes: Negative for redness and visual disturbance.  Respiratory: Negative for shortness of breath and wheezing.  Cardiovascular: Negative for chest pain and palpitations.  Gastrointestinal: Positive for diarrhea but negative for abdominal pain Genitourinary: Has ileostomy due  to bladder cancer Endocrine: Denies polyuria, polyphagia and heat intolerance Musculoskeletal: Negative for myalgias and arthralgias.  Skin: Negative for pallor and wound.  Neurological: Positive for dizziness and headaches   VITAL SIGNS: BP (!) 102/54   Pulse 99   Temp 97.8 F (36.6 C) (Oral)   Resp 17   Ht 5\' 7"  (1.702 m)   Wt 70.8 kg   SpO2 93%   BMI 24.45 kg/m   HEMODYNAMICS:    VENTILATOR SETTINGS:    INTAKE / OUTPUT: No intake/output data recorded.  PHYSICAL EXAMINATION: General: No acute distress HEENT:  PERRLA, trachea midline, no JVD Neuro: Alert and oriented x3, no focal deficits Cardiovascular: Apical pulse tachycardic, irregular, S1-S2, no murmur regurg or gallop, +2 pulses bilaterally Lungs: Clear to auscultation bilateral Abdomen: Nondistended, normal bowel sounds in all 4 quadrants, ileostomy with clear urine Musculoskeletal: Positive range of motion, no joint deformities Skin: Warm and dry  LABS:  BMET Recent Labs  Lab 03/29/19 2013  NA 123*  K 3.8  CL 88*  CO2 20*  BUN 90*  CREATININE 4.03*  GLUCOSE 155*    Electrolytes Recent Labs  Lab 03/29/19 2013  CALCIUM 7.5*    CBC Recent Labs  Lab 03/29/19 2013  WBC 9.9  HGB 9.5*  HCT 30.0*  PLT 199    Coag's No results for input(s): APTT, INR in the last 168 hours.  Sepsis Markers Recent Labs  Lab 03/29/19 2013  LATICACIDVEN 1.9    ABG No results for input(s): PHART, PCO2ART, PO2ART in the last 168 hours.  Liver Enzymes No results for input(s): AST, ALT, ALKPHOS, BILITOT, ALBUMIN in the last 168 hours.  Cardiac Enzymes No results for input(s): TROPONINI, PROBNP in the last 168 hours.  Glucose Recent Labs  Lab 03/29/19 2051 03/30/19 0116 03/30/19 0126 03/30/19 0145 03/30/19 0408  GLUCAP 197* 46* 40* 136* 92    Imaging Dg Chest Portable 1 View  Result Date: 03/29/2019 CLINICAL DATA:  Weakness EXAM: PORTABLE CHEST 1 VIEW COMPARISON:  02/15/2019, 10/30/2017, 08/09/2016 FINDINGS: Post sternotomy changes. Cardiomegaly. Coarse bilateral interstitial opacity, suspect for chronic interstitial change. Streaky superimposed opacity in the left mid and lower lung. Aortic atherosclerosis. No pneumothorax. IMPRESSION: 1. Cardiomegaly. 2. Streaky left mid and lower lung opacities, suspect for acute infectious or inflammatory process superimposed on underlying chronic disease. Electronically Signed   By: Donavan Foil M.D.   On: 03/29/2019 21:16   STUDIES:  None  CULTURES: Blood cultures x2 Urine  culture  ANTIBIOTICS: Vancomycin Cefepime  SIGNIFICANT EVENTS: 03/29/2019: Admitted  LINES/TUBES: Peripheral IVs  DISCUSSION: 78 year old male with a history of bladder cancer and an ileostomy presenting with UTI, pneumonia, sepsis and septic shock  ASSESSMENT Septic shock requiring pressors Sepsis secondary to pneumonia and UTI Urinary tract infection HCAP Hypoglycemia Hyponatremia COPD Bladder cancer Acute on chronic renal failure Hypertension  PLAN Hemodynamic monitoring per ICU protocol Antibiotics as above IV fluids and pressors to maintain mean arterial blood pressure greater than 65 Hold insulin and oral hypoglycemics in light of hypoglycemia Point-of-care glucose monitoring every 4 hours Trend creatinine Change IV fluids to D5 half-normal saline at 100 and change to LR on normal saline once blood glucose levels are consistently greater than 100 mg/dL Monitor and correct electrolytes Hold antihypertensives in light of hypotension Follow-up cultures   Best Practice: Code Status: Full code Diet: Heart healthy GI prophylaxis: Not indicated VTE prophylaxis: SCDs and subcu heparin  FAMILY  - Updates: Family updated by ED and all admitting service.  Will update with any changes in treatment plan  Rhylie Stehr S. Genoveva Ill ANP-BC Pulmonary and [place] Pager [phone] or [phone]  NB: This document was prepared using Dragon voice recognition software and may include unintentional dictation errors.    03/30/2019, 4:15 AM

## 2019-03-30 NOTE — Progress Notes (Signed)
Advanced care plan.  Purpose of the Encounter: CODE STATUS  Parties in Attendance: Patient himself  Patient's Decision Capacity: Intact  Subjective/Patient's story: Patient MD 78-year-old with chronic kidney disease stage IV, diabetes, hypertension admitted with sepsis   Objective/Medical story I discussed with the patient regarding desire for cardiac and pulmonary resuscitation   Goals of care determination:   Patient states he would like everything to be done  CODE STATUS: Full   Time spent discussing advanced care planning: 16 minutes

## 2019-03-30 NOTE — Progress Notes (Signed)
Pharmacy Antibiotic Note  Bryan Brewer is a 78 y.o. male admitted on 03/29/2019 with sepsis (with tachycardia and bandemia). He was found to have pneumonia on imaging and likely UTI, based on urinalysis. Patient became hypotensive was admitted to the ICU requiring pressors. History significant for bladder cancer, COPD, hypertension, and kidney disease. Patient is noted to have acute on chronic renal failure. Serum creatinine has improved slightly but still very high. Patient is afebrile and WBC is stable. Pharmacy has been consulted for vancomycin and cefepime dosing.  Plan: Continue cefepime 2g Q24H Hold vancomycin and obtain level at 08/25 1100. Plan to administer next dose when vancomycin level is < 20.     Height: 5\' 7"  (170.2 cm) Weight: 156 lb 1.4 oz (70.8 kg) IBW/kg (Calculated) : 66.1  Temp (24hrs), Avg:97.8 F (36.6 C), Min:96.4 F (35.8 C), Max:99.1 F (37.3 C)  Recent Labs  Lab 03/29/19 2013 03/30/19 0359 03/30/19 0541  WBC 9.9 10.5  --   CREATININE 4.03* 3.57*  --   LATICACIDVEN 1.9  --  1.2    Estimated Creatinine Clearance: 16.2 mL/min (A) (by C-G formula based on SCr of 3.57 mg/dL (H)).    No Known Allergies  Antimicrobials this admission: Cefepime 08/23 >>  Vancomycin 08/24 >>   Dose adjustments this admission: n/a  Microbiology results: 08/24 MRSA PCR: positive 08/25 Urine Culture; Pending   Thank you for allowing pharmacy to be a part of this patient's care.  Bryan Brewer [name] 03/30/2019 1:23 PM

## 2019-03-30 NOTE — Progress Notes (Signed)
[facility]                                                                                                                                                                                  Patient Demographics   Bryan Brewer, is a 78 y.o. male, DOB - Nov 29, 1940, NOM:[id number]  Admit date - 03/29/2019   Admitting Physician Lance Coon, MD  Outpatient Primary MD for the patient is Glendon Axe, MD   LOS - 1  Subjective: Patient feeling better Denies any chest pain or shortness of breath   Review of Systems:   CONSTITUTIONAL: No documented fever. No fatigue, weakness. No weight gain, no weight loss.  EYES: No blurry or double vision.  ENT: No tinnitus. No postnasal drip. No redness of the oropharynx.  RESPIRATORY: No cough, no wheeze, no hemoptysis. No dyspnea.  CARDIOVASCULAR: No chest pain. No orthopnea. No palpitations. No syncope.  GASTROINTESTINAL: No nausea, no vomiting or diarrhea. No abdominal pain. No melena or hematochezia.  GENITOURINARY: No dysuria or hematuria.  ENDOCRINE: No polyuria or nocturia. No heat or cold intolerance.  HEMATOLOGY: No anemia. No bruising. No bleeding.  INTEGUMENTARY: No rashes. No lesions.  MUSCULOSKELETAL: No arthritis. No swelling. No gout.  NEUROLOGIC: No numbness, tingling, or ataxia. No seizure-type activity.  PSYCHIATRIC: No anxiety. No insomnia. No ADD.    Vitals:   Vitals:   03/30/19 1200 03/30/19 1300 03/30/19 1400 03/30/19 1453  BP: 116/76 (!) 106/56 (!) 106/48 (!) 110/55  Pulse: 97 (!) 105 (!) 125 (!) 109  Resp: (!) 22 (!) 25 (!) 23 (!) 22  Temp:    98.5 F (36.9 C)  TempSrc:    Oral  SpO2: 97% 97% 97% 96%  Weight:      Height:        Wt Readings from Last 3 Encounters:  03/30/19 70.8 kg  02/15/19 73.4 kg  11/01/17 72.5 kg     Intake/Output Summary (Last 24 hours) at 03/30/2019 1503 Last data filed at 03/30/2019 0943 Gross per 24 hour  Intake 2090.79 ml  Output 2125 ml   Net -34.21 ml    Physical Exam:   GENERAL: Pleasant-appearing in no apparent distress.  HEAD, EYES, EARS, NOSE AND THROAT: Atraumatic, normocephalic. Extraocular muscles are intact. Pupils equal and reactive to light. Sclerae anicteric. No conjunctival injection. No oro-pharyngeal erythema.  NECK: Supple. There is no jugular venous distention. No bruits, no lymphadenopathy, no thyromegaly.  HEART: Regular rate and rhythm,. No murmurs, no rubs, no clicks.  LUNGS: Clear to auscultation bilaterally. No rales or rhonchi. No wheezes.  ABDOMEN: Soft, flat, nontender, nondistended. Has good bowel sounds. No hepatosplenomegaly appreciated.  EXTREMITIES: No evidence of any cyanosis, clubbing, or peripheral edema.  +2 pedal and radial pulses bilaterally.  NEUROLOGIC: The patient is alert, awake, and oriented x3 with no focal motor or sensory deficits appreciated bilaterally.  SKIN: Moist and warm with no rashes appreciated.  Psych: Not anxious, depressed LN: No inguinal LN enlargement    Antibiotics   Anti-infectives (From admission, onward)   Start     Dose/Rate Route Frequency Ordered Stop   03/30/19 2300  ceFEPIme (MAXIPIME) 2 g in sodium chloride 0.9 % 100 mL IVPB     2 g 200 mL/hr over 30 Minutes Intravenous Every 24 hours 03/30/19 0809     03/29/19 2354  vancomycin variable dose per unstable renal function (pharmacist dosing)      Does not apply See admin instructions 03/29/19 2354     03/29/19 2330  vancomycin (VANCOCIN) 1,500 mg in sodium chloride 0.9 % 500 mL IVPB     1,500 mg 250 mL/hr over 120 Minutes Intravenous  Once 03/29/19 2248 03/30/19 0213   03/29/19 2245  vancomycin (VANCOCIN) IVPB 1000 mg/200 mL premix  Status:  Discontinued     1,000 mg 200 mL/hr over 60 Minutes Intravenous  Once 03/29/19 2242 03/29/19 2246   03/29/19 2245  ceFEPIme (MAXIPIME) 2 g in sodium chloride 0.9 % 100 mL IVPB     2 g 200 mL/hr over 30 Minutes Intravenous  Once 03/29/19 2242 03/29/19 2358       Medications   Scheduled Meds: . aspirin EC  81 mg Oral Daily  . atorvastatin  80 mg Oral q1800  . heparin  5,000 Units Subcutaneous Q8H  . insulin aspart  0-9 Units Subcutaneous Q4H  . sodium bicarbonate  1,300 mg Oral TID  . vancomycin variable dose per unstable renal function (pharmacist dosing)   Does not apply See admin instructions   Continuous Infusions: . ceFEPime (MAXIPIME) IV    . lactated ringers 50 mL/hr at 03/30/19 1130  . norepinephrine (LEVOPHED) Adult infusion 6 mcg/min (03/30/19 1328)  . potassium chloride 10 mEq (03/30/19 1453)   PRN Meds:.acetaminophen, ondansetron (ZOFRAN) IV   Data Review:   Micro Results Recent Results (from the past 240 hour(s))  MRSA PCR Screening     Status: Abnormal   Collection Time: 03/30/19  1:31 AM   Specimen: Nasal Mucosa; Nasopharyngeal  Result Value Ref Range Status   MRSA by PCR POSITIVE (A) NEGATIVE Final    Comment:        The GeneXpert MRSA Assay (FDA approved for NASAL specimens only), is one component of a comprehensive MRSA colonization surveillance program. It is not intended to diagnose MRSA infection nor to guide or monitor treatment for MRSA infections. RESULT CALLED TO, READ BACK BY AND VERIFIED WITH: Berna Bue RN AT [phone] ON 03/30/2019 [facility] Performed at [facility], [address], [place], [place] [address]     Radiology Reports Dg Chest Portable 1 View  Result Date: 03/29/2019 CLINICAL DATA:  Weakness EXAM: PORTABLE CHEST 1 VIEW COMPARISON:  02/15/2019, 10/30/2017, 08/09/2016 FINDINGS: Post sternotomy changes. Cardiomegaly. Coarse bilateral interstitial opacity, suspect for chronic interstitial change. Streaky superimposed opacity in the left mid and lower lung. Aortic atherosclerosis. No pneumothorax. IMPRESSION: 1. Cardiomegaly. 2. Streaky left mid and lower lung opacities, suspect for acute infectious or inflammatory process superimposed on underlying chronic disease. Electronically  Signed   By: Donavan Foil M.D.   On: 03/29/2019 21:16     CBC Recent Labs  Lab 03/29/19 2013 03/30/19  0359  WBC 9.9 10.5  HGB 9.5* 10.1*  HCT 30.0* 31.1*  PLT 199 240  MCV 85.7 83.6  MCH 27.1 27.2  MCHC 31.7 32.5  RDW 15.4 15.2  LYMPHSABS 0.1*  --   MONOABS 0.6  --   EOSABS 0.0  --   BASOSABS 0.0  --     Chemistries  Recent Labs  Lab 03/29/19 2013 03/30/19 0359  NA 123* 128*  K 3.8 3.0*  CL 88* 97*  CO2 20* 19*  GLUCOSE 155* 93  BUN 90* 78*  CREATININE 4.03* 3.57*  CALCIUM 7.5* 7.1*  MG  --  1.7  AST  --  26  ALT  --  14  ALKPHOS  --  55  BILITOT  --  1.0   ------------------------------------------------------------------------------------------------------------------ estimated creatinine clearance is 16.2 mL/min (A) (by C-G formula based on SCr of 3.57 mg/dL (H)). ------------------------------------------------------------------------------------------------------------------ No results for input(s): HGBA1C in the last 72 hours. ------------------------------------------------------------------------------------------------------------------ No results for input(s): CHOL, HDL, LDLCALC, TRIG, CHOLHDL, LDLDIRECT in the last 72 hours. ------------------------------------------------------------------------------------------------------------------ No results for input(s): TSH, T4TOTAL, T3FREE, THYROIDAB in the last 72 hours.  Invalid input(s): FREET3 ------------------------------------------------------------------------------------------------------------------ No results for input(s): VITAMINB12, FOLATE, FERRITIN, TIBC, IRON, RETICCTPCT in the last 72 hours.  Coagulation profile Recent Labs  Lab 03/30/19 1128  INR 1.3*    No results for input(s): DDIMER in the last 72 hours.  Cardiac Enzymes No results for input(s): CKMB, TROPONINI, MYOGLOBIN in the last 168 hours.  Invalid input(s):  CK ------------------------------------------------------------------------------------------------------------------ Invalid input(s): Charlo  Patient 78 year old admitted with sepsis    Severe sepsis with septic shock ([place]) - This is due to pneumonia Continue cefepime and vancomycin  Acute on chronic renal failure ([facility]) -due to sepsis, dehydration.  IV fluids as above.  Avoid nephrotoxins and monitor. Patient's renal function is stage IV I discussed with the patient he follows up with the nephrologist continue to monitor renal function avoid nephrotoxic  UTI (urinary tract infection) -IV antibiotics, urine culture as above  Hyponatremia -IV fluids as above  Diabetes type II with renal complications continue sliding scale for now, discussed with the patient regarding close monitoring his blood sugar follow-up with primary care provider and adhering to a carbohydrate consistent diet  HTN (hypertension) -hold antihypertensives as the patient is currently hypotensive on pressors  COPD (chronic obstructive pulmonary disease) ([place]) -continue inhalers patient no longer smokes  HLD (hyperlipidemia) -continue home dose antilipid     Code Status Orders  (From admission, onward)         Start     Ordered   03/30/19 0124  Full code  Continuous     03/30/19 0124        Code Status History    Date Active Date Inactive Code Status Order ID Comments User Context   02/14/2019 2330 02/16/2019 1909 Full Code [id number]  Henreitta Leber, MD Inpatient   10/30/2017 0203 11/01/2017 2259 Full Code [id number]  Lance Coon, MD Inpatient   Advance Care Planning Activity           Consults  intesivist  DVT Prophylaxis  heparin  Lab Results  Component Value Date   PLT 240 03/30/2019     Time Spent in minutes  22min Greater than 50% of time spent in care coordination and counseling patient regarding the condition and plan of care.   Dustin Flock M.D on  03/30/2019 at 3:03 PM  Between 7am to 6pm - Pager - [phone]  After 6pm go to [email or web address] - Proofreader  [employer]   Office  [phone]

## 2019-03-30 NOTE — ED Notes (Signed)
Ena Dawley, RN attempting for 2nd IV now. Using [place].

## 2019-03-30 NOTE — ED Notes (Signed)
Pt had loose BM in bed. Provided peri care. Linens changed.

## 2019-03-30 NOTE — Progress Notes (Addendum)
Pharmacy Antibiotic Note  Bryan Brewer is a 78 y.o. male admitted on 03/29/2019 with sepsis.  Pharmacy has been consulted for Cefepime and Vancomycin dosing.  Pt received Cefepime 2gm in ED at 2321, pt received Vancomycin 1500mg  in ED at 12:09am (0009)  Plan: SCr elevated, acute on chronic renal failure noted, pt will not need additional doses in next 12-18 hrs unless SCr improves.  F/U repeat labs (SCr) to assess renal fxn and order additional doses of Cefepime and Vancomycin  Height: 5\' 7"  (170.2 cm) Weight: 160 lb (72.6 kg) IBW/kg (Calculated) : 66.1  No data recorded.  Recent Labs  Lab 03/29/19 2013  WBC 9.9  CREATININE 4.03*  LATICACIDVEN 1.9    Estimated Creatinine Clearance: 14.4 mL/min (A) (by C-G formula based on SCr of 4.03 mg/dL (H)).    No Known Allergies  Antimicrobials this admission: Cefepime 8/23 >>  Vancomycin 8/24 >>   Dose adjustments this admission:  Microbiology results:  Thank you for allowing pharmacy to be a part of this patient's care.  Hart Robinsons A 03/30/2019 12:54 AM

## 2019-03-30 NOTE — ED Notes (Signed)
Pt cleaned of stool, ostomy bag emptied, 475 mls

## 2019-03-31 ENCOUNTER — Inpatient Hospital Stay: Admit: 2019-03-31 | Payer: Medicare Other

## 2019-03-31 ENCOUNTER — Inpatient Hospital Stay
Admit: 2019-03-31 | Discharge: 2019-03-31 | Disposition: A | Discharge status: Home / Self Care | Payer: Medicare Other | Attending: Pulmonary Disease | Admitting: Pulmonary Disease

## 2019-03-31 DIAGNOSIS — L8990 Pressure ulcer of unspecified site, unspecified stage: Secondary | ICD-10-CM | POA: Insufficient documentation

## 2019-03-31 LAB — CBC
HCT: 26 % — ABNORMAL LOW (ref 39.0–52.0)
Hemoglobin: 8.3 g/dL — ABNORMAL LOW (ref 13.0–17.0)
MCH: 26.9 pg (ref 26.0–34.0)
MCHC: 31.9 g/dL (ref 30.0–36.0)
MCV: 84.4 fL (ref 80.0–100.0)
Platelets: 162 10*3/uL (ref 150–400)
RBC: 3.08 MIL/uL — ABNORMAL LOW (ref 4.22–5.81)
RDW: 15.3 % (ref 11.5–15.5)
WBC: 5.8 10*3/uL (ref 4.0–10.5)
nRBC: 0 % (ref 0.0–0.2)

## 2019-03-31 LAB — GLUCOSE, CAPILLARY
Glucose-Capillary: 44 mg/dL — CL (ref 70–99)
Glucose-Capillary: 64 mg/dL — ABNORMAL LOW (ref 70–99)
Glucose-Capillary: 102 mg/dL — ABNORMAL HIGH (ref 70–99)
Glucose-Capillary: 75 mg/dL (ref 70–99)
Glucose-Capillary: 191 mg/dL — ABNORMAL HIGH (ref 70–99)
Glucose-Capillary: 398 mg/dL — ABNORMAL HIGH (ref 70–99)
Glucose-Capillary: 483 mg/dL — ABNORMAL HIGH (ref 70–99)
Glucose-Capillary: 376 mg/dL — ABNORMAL HIGH (ref 70–99)

## 2019-03-31 LAB — ECHOCARDIOGRAM COMPLETE
Height: 67 in
Weight: 2522.06 oz

## 2019-03-31 LAB — BASIC METABOLIC PANEL
Anion gap: 11 (ref 5–15)
BUN: 64 mg/dL — ABNORMAL HIGH (ref 8–23)
CO2: 19 mmol/L — ABNORMAL LOW (ref 22–32)
Calcium: 7 mg/dL — ABNORMAL LOW (ref 8.9–10.3)
Chloride: 100 mmol/L (ref 98–111)
Creatinine, Ser: 2.92 mg/dL — ABNORMAL HIGH (ref 0.61–1.24)
GFR calc Af Amer: 23 mL/min — ABNORMAL LOW (ref >60)
GFR calc non Af Amer: 20 mL/min — ABNORMAL LOW (ref >60)
Glucose, Bld: 73 mg/dL (ref 70–99)
Potassium: 2.8 mmol/L — ABNORMAL LOW (ref 3.5–5.1)
Sodium: 130 mmol/L — ABNORMAL LOW (ref 135–145)

## 2019-03-31 LAB — PHOSPHORUS: Phosphorus: 3 mg/dL (ref 2.5–4.6)

## 2019-03-31 LAB — MAGNESIUM: Magnesium: 1.5 mg/dL — ABNORMAL LOW (ref 1.7–2.4)

## 2019-03-31 LAB — NOVEL CORONAVIRUS, NAA (HOSP ORDER, SEND-OUT TO REF LAB; TAT 18-24 HRS): SARS-CoV-2, NAA: NOT DETECTED

## 2019-03-31 LAB — BRAIN NATRIURETIC PEPTIDE: B Natriuretic Peptide: 646 pg/mL — ABNORMAL HIGH (ref 0.0–100.0)

## 2019-03-31 MED ORDER — INSULIN ASPART 100 UNIT/ML ~~LOC~~ SOLN
5.0000 [IU] | Freq: Once | SUBCUTANEOUS | Status: AC
  Administered 2019-03-31: 5 [IU] via SUBCUTANEOUS

## 2019-03-31 MED ORDER — ALBUMIN HUMAN 25 % IV SOLN
12.5000 g | Freq: Every day | INTRAVENOUS | Status: DC
  Administered 2019-03-31 – 2019-04-02 (×3): 12.5 g via INTRAVENOUS
  Filled 2019-03-31 (×2): qty 50
  Filled 2019-03-31: qty 100
  Filled 2019-03-31: qty 50

## 2019-03-31 MED ORDER — POTASSIUM CHLORIDE CRYS ER 20 MEQ PO TBCR
40.0000 meq | EXTENDED_RELEASE_TABLET | ORAL | Status: AC
  Administered 2019-03-31 (×2): 40 meq via ORAL
  Filled 2019-03-31 (×2): qty 2

## 2019-03-31 MED ORDER — METHYLPREDNISOLONE SODIUM SUCC 125 MG IJ SOLR
60.0000 mg | Freq: Two times a day (BID) | INTRAMUSCULAR | Status: DC
  Administered 2019-03-31 – 2019-04-04 (×9): 60 mg via INTRAVENOUS
  Filled 2019-03-31 (×9): qty 2

## 2019-03-31 MED ORDER — MAGNESIUM SULFATE 4 GM/100ML IV SOLN
4.0000 g | Freq: Once | INTRAVENOUS | Status: AC
  Administered 2019-03-31: 4 g via INTRAVENOUS
  Filled 2019-03-31: qty 100

## 2019-03-31 MED ORDER — OCUVITE-LUTEIN PO CAPS
1.0000 | ORAL_CAPSULE | Freq: Every day | ORAL | Status: DC
  Administered 2019-04-01 – 2019-04-04 (×4): 1 via ORAL
  Filled 2019-03-31 (×5): qty 1

## 2019-03-31 MED ORDER — VANCOMYCIN HCL IN DEXTROSE 1-5 GM/200ML-% IV SOLN
1000.0000 mg | Freq: Once | INTRAVENOUS | Status: AC
  Administered 2019-03-31: 1000 mg via INTRAVENOUS
  Filled 2019-03-31: qty 200

## 2019-03-31 MED ORDER — ENSURE MAX PROTEIN PO LIQD
11.0000 [oz_av] | Freq: Two times a day (BID) | ORAL | Status: DC
  Administered 2019-04-01 – 2019-04-04 (×7): 11 [oz_av] via ORAL
  Filled 2019-03-31: qty 330

## 2019-03-31 MED ORDER — SODIUM CHLORIDE 0.9 % IV SOLN: 4.0000 g | Freq: Once | INTRAVENOUS | Status: DC

## 2019-03-31 NOTE — Consult Note (Signed)
[address] [place], [place] [address] Phone [phone]. FAx [phone]  Date: 03/31/2019                  Patient Name:  Bryan Brewer  MRN: [id number]  DOB: Dec 23, 1940  Age / Sex: 78 y.o., male         PCP: Glendon Axe, MD                 Service Requesting Consult: IM/ Dustin Flock, MD                 Reason for Consult: AKI            History of Present Illness: Patient is a 78 y.o. male with medical problems of T2DM, CKD st 4, [facility],COPD, h/o bladder cancer, who was admitted to [facility] on 03/29/2019 for evaluation of hypoglycemia. According to ER notes, patient was fatigued, lethargic and diaphoretic prior to arrival.  Patient has h/o bladder cancer with urostomy, COPD, DM-2, HTN and HLD Admitted for  Patient Active Problem List   Diagnosis Date Noted  . Pressure injury of skin 03/31/2019  . HCAP (healthcare-associated pneumonia) 03/29/2019  . UTI (urinary tract infection) 03/29/2019  . Severe sepsis with septic shock ([place]) 03/29/2019  . Hyponatremia 03/29/2019  . Acute on chronic renal failure ([place]) 02/14/2019  . NSTEMI (non-ST elevated myocardial infarction) ([place]) 10/30/2017  . Diabetes ([place]) 10/30/2017  . CKD (chronic kidney disease), stage IV ([place]) 10/30/2017  . HTN (hypertension) 10/30/2017  . HLD (hyperlipidemia) 10/30/2017  . COPD (chronic obstructive pulmonary disease) ([place]) 10/30/2017   Baseline creatinine of 2.9-3.1 as outpatient in mid July 2020/ GFR 19-21 Followed by [facility] nephrology as outpatient      Medications: Outpatient medications: Medications Prior to Admission  Medication Sig Dispense Refill Last Dose  . atorvastatin (LIPITOR) 80 MG tablet Take 80 mg by mouth at bedtime.   03/29/2019 at 1000  . furosemide (LASIX) 80 MG tablet Take 80 mg by mouth daily.   03/30/2019 at 1500  . glimepiride (AMARYL) 2 MG tablet Take 2 mg by mouth daily with breakfast.   unknown at unknown  . hydrALAZINE (APRESOLINE) 50 MG tablet Take  50 mg by mouth 3 (three) times daily.    unknown at unknown  . isosorbide mononitrate (IMDUR) 60 MG 24 hr tablet Take 60 mg by mouth daily.    unknown at unknown  . metoprolol tartrate (LOPRESSOR) 50 MG tablet Take 50 mg by mouth 2 (two) times daily.   03/29/2019 at 1000  . sodium bicarbonate 650 MG tablet Take 1,300 mg by mouth 3 (three) times daily.   03/29/2019 at 1000  . spironolactone (ALDACTONE) 25 MG tablet Take 12.5 mg by mouth daily.   unknown at unknown  . aspirin EC 81 MG tablet Take 81 mg by mouth daily.   Not Taking at Unknown time    Current medications: Current Facility-Administered Medications  Medication Dose Route Frequency Provider Last Rate Last Dose  . 0.9 %  sodium chloride infusion  250 mL Intravenous Continuous Ottie Glazier, MD      . acetaminophen (TYLENOL) tablet 650 mg  650 mg Oral Q4H PRN Lance Coon, MD      . albumin human 25 % solution 12.5 g  12.5 g Intravenous Daily Ottie Glazier, MD      . aspirin EC tablet 81 mg  81 mg Oral Daily Lance Coon, MD   81 mg at 03/31/19 0858  . atorvastatin (  LIPITOR) tablet 80 mg  80 mg Oral q1800 Lance Coon, MD   80 mg at 03/30/19 1726  . ceFEPIme (MAXIPIME) 2 g in sodium chloride 0.9 % 100 mL IVPB  2 g Intravenous Q24H Tawnya Crook, RPH   Stopped at 03/30/19 2223  . Chlorhexidine Gluconate Cloth 2 % PADS 6 each  6 each Topical Daily Ottie Glazier, MD   6 each at 03/31/19 0859  . heparin injection 5,000 Units  5,000 Units Subcutaneous Driscilla Moats, MD   5,000 Units at 03/31/19 [phone]  . insulin aspart (novoLOG) injection 0-9 Units  0-9 Units Subcutaneous Q4H Lance Coon, MD   1 Units at 03/30/19 1920  . methylPREDNISolone sodium succinate (SOLU-MEDROL) 125 mg/2 mL injection 60 mg  60 mg Intravenous Q12H Ottie Glazier, MD   60 mg at 03/31/19 0858  . norepinephrine (LEVOPHED) 4 mg in dextrose 5 % 250 mL (0.016 mg/mL) infusion  0-10 mcg/min Intravenous Continuous Ottie Glazier, MD   Stopped at 03/31/19 [phone]   . ondansetron (ZOFRAN) injection 4 mg  4 mg Intravenous Q6H PRN Lance Coon, MD      . sodium bicarbonate tablet 1,300 mg  1,300 mg Oral TID Lance Coon, MD   1,300 mg at 03/31/19 0859  . vancomycin (VANCOCIN) IVPB 1000 mg/200 mL premix  1,000 mg Intravenous Once Tawnya Crook, RPH      . vancomycin variable dose per unstable renal function (pharmacist dosing)   Does not apply See admin instructions Ena Dawley, RPH          Allergies: No Known Allergies    Past Medical History: Past Medical History:  Diagnosis Date  . Bladder cancer ([place])   . COPD (chronic obstructive pulmonary disease) ([place])   . Hyperlipidemia   . Hypertension   . Kidney disease   . Type 2 diabetes mellitus ([place])      Past Surgical History: Past Surgical History:  Procedure Laterality Date  . LEFT HEART CATH AND CORONARY ANGIOGRAPHY N/A 10/31/2017   Procedure: LEFT HEART CATH AND CORONARY ANGIOGRAPHY;  Surgeon: Yolonda Kida, MD;  Location: [facility];  Service: Cardiovascular;  Laterality: N/A;  . REVISION UROSTOMY CUTANEOUS       Family History: Family History  Problem Relation Age of Onset  . CAD Mother   . Stroke Mother   . CAD Father   . Stroke Father   . Breast cancer Sister      Social History: Social History   Socioeconomic History  . Marital status: Married    Spouse name: Not on file  . Number of children: Not on file  . Years of education: Not on file  . Highest education level: Not on file  Occupational History  . Not on file  Social Needs  . Financial resource strain: Not on file  . Food insecurity    Worry: Not on file    Inability: Not on file  . Transportation needs    Medical: Not on file    Non-medical: Not on file  Tobacco Use  . Smoking status: Former Smoker    Packs/day: 1.50    Years: 35.00    Pack years: 52.50    Types: Cigarettes    Quit date: 2013    Years since quitting: 7.6  . Smokeless tobacco: Never Used  Substance and  Sexual Activity  . Alcohol use: No    Frequency: Never  . Drug use: Never  . Sexual activity: Not on  file  Lifestyle  . Physical activity    Days per week: Not on file    Minutes per session: Not on file  . Stress: Not on file  Relationships  . Social Herbalist on phone: Not on file    Gets together: Not on file    Attends religious service: Not on file    Active member of club or organization: Not on file    Attends meetings of clubs or organizations: Not on file    Relationship status: Not on file  . Intimate partner violence    Fear of current or ex partner: Not on file    Emotionally abused: Not on file    Physically abused: Not on file    Forced sexual activity: Not on file  Other Topics Concern  . Not on file  Social History Narrative  . Not on file     Review of Systems:  Gen: Denies any fevers or chills HEENT: No vision or hearing problems CV: No chest pain or shortness of breath Resp: No cough or sputum production GI: No nausea, vomiting or diarrhea.  No blood in the stool GU : No problems with voiding.  No hematuria.  No previous history of kidney problems MS: Ambulatory.  Denies any acute joint pain or swelling Derm:   No complaints Psych: No complaints Heme: No complaints Neuro: No complaints Endocrine:blood sugar was low at admission  Vital Signs: Blood pressure (!) 105/49, pulse (!) 123, temperature 98.1 F (36.7 C), temperature source Oral, resp. rate 16, height 5\' 7"  (1.702 m), weight 71.5 kg, SpO2 97 %.   Intake/Output Summary (Last 24 hours) at 03/31/2019 1033 Last data filed at 03/31/2019 0753 Gross per 24 hour  Intake 1646.84 ml  Output 1975 ml  Net -328.16 ml    Weight trends: Filed Weights   03/29/19 2003 03/30/19 0126 03/31/19 0350  Weight: 72.6 kg 70.8 kg 71.5 kg    Physical Exam: Gen:   Alert, cooperative, no distress, appears stated age Head:   Normocephalic, without obvious abnormality, atraumatic Eyes/ENT:   conjunctiva/corneas clear,  moist oral mucus membranes Neck:  Supple,  thyroid: not enlarged, no JVD Lungs:   Clear to auscultation bilaterally, respirations unlabored Heart:   Regular rhythm  With ectopic beats, no  rub or gallop Abdomen:   Soft, non-tender,  Extremities: no cyanosis, trace edema Skin:  Skin color, texture, turgor normal, no rashes or lesions Neurologic: Alert and oriented, able to answer questions appropriately   Lab results: Basic Metabolic Panel: Recent Labs  Lab 03/29/19 2013 03/30/19 0359 03/31/19 0406  NA 123* 128* 130*  K 3.8 3.0* 2.8*  CL 88* 97* 100  CO2 20* 19* 19*  GLUCOSE 155* 93 73  BUN 90* 78* 64*  CREATININE 4.03* 3.57* 2.92*  CALCIUM 7.5* 7.1* 7.0*  MG  --  1.7 1.5*  PHOS  --  5.2* 3.0    Liver Function Tests: Recent Labs  Lab 03/30/19 0359  AST 26  ALT 14  ALKPHOS 55  BILITOT 1.0  PROT 6.0*  ALBUMIN 2.1*   No results for input(s): LIPASE, AMYLASE in the last 168 hours. No results for input(s): AMMONIA in the last 168 hours.  CBC: Recent Labs  Lab 03/29/19 2013 03/30/19 0359 03/31/19 0406  WBC 9.9 10.5 5.8  NEUTROABS 9.3*  --   --   HGB 9.5* 10.1* 8.3*  HCT 30.0* 31.1* 26.0*  MCV 85.7 83.6 84.4  PLT 199 240 162  Cardiac Enzymes: No results for input(s): CKTOTAL, TROPONINI in the last 168 hours.  BNP: Invalid input(s): POCBNP  CBG: Recent Labs  Lab 03/30/19 2345 03/31/19 0347 03/31/19 0400 03/31/19 0434 03/31/19 0730  GLUCAP 72 44* 64* 102* 75    Microbiology: Recent Results (from the past 720 hour(s))  MRSA PCR Screening     Status: Abnormal   Collection Time: 03/30/19  1:31 AM   Specimen: Nasal Mucosa; Nasopharyngeal  Result Value Ref Range Status   MRSA by PCR POSITIVE (A) NEGATIVE Final    Comment:        The GeneXpert MRSA Assay (FDA approved for NASAL specimens only), is one component of a comprehensive MRSA colonization surveillance program. It is not intended to diagnose MRSA infection  nor to guide or monitor treatment for MRSA infections. RESULT CALLED TO, READ BACK BY AND VERIFIED WITH: Berna Bue RN AT [phone] ON 03/30/2019 [facility] Performed at [facility], [address] [place], [place] [address]      Coagulation Studies: Recent Labs    03/30/19 1128  LABPROT 15.6*  INR 1.3*    Urinalysis: Recent Labs    03/29/19 2216  COLORURINE YELLOW*  LABSPEC 1.006  PHURINE 7.0  GLUCOSEU NEGATIVE  HGBUR MODERATE*  BILIRUBINUR NEGATIVE  KETONESUR NEGATIVE  PROTEINUR 30*  NITRITE NEGATIVE  LEUKOCYTESUR LARGE*        Imaging: Dg Chest Portable 1 View  Result Date: 03/29/2019 CLINICAL DATA:  Weakness EXAM: PORTABLE CHEST 1 VIEW COMPARISON:  02/15/2019, 10/30/2017, 08/09/2016 FINDINGS: Post sternotomy changes. Cardiomegaly. Coarse bilateral interstitial opacity, suspect for chronic interstitial change. Streaky superimposed opacity in the left mid and lower lung. Aortic atherosclerosis. No pneumothorax. IMPRESSION: 1. Cardiomegaly. 2. Streaky left mid and lower lung opacities, suspect for acute infectious or inflammatory process superimposed on underlying chronic disease. Electronically Signed   By: Donavan Foil M.D.   On: 03/29/2019 21:16      Assessment & Plan: Pt is a 78 y.o.   male with with medical problems of T2DM, CKD st 4, [facility],COPD, h/o bladder cancer Dx 2008 s/p chemo, radiation with ileal conduit and urostomy, was admitted on 03/29/2019 with hypoglycemia and fatigue. [facility]  1. AKI on CKD st 4 Cause of AKI is likely secondary to hemodynamic changes.  Creatinine on admission of 4.03 down to 2.92 today.  Outpatient baseline creatinine of 2.9-3.1.   Continue supportive care.  We will folow  2. Hypokalemia - replace potassium as neededx  3. Metabolic acidosis from CKD - continue bicarb supplements       LOS: 2 Kemonie Cutillo 8/25/202010:33 AM    Note: This note was prepared with Dragon dictation. Any  transcription errors are unintentional

## 2019-03-31 NOTE — Progress Notes (Signed)
PHARMACY CONSULT NOTE - FOLLOW UP  Pharmacy Consult for Electrolyte Monitoring and Replacement   Recent Labs: Potassium (mmol/L)  Date Value  03/31/2019 2.8 (L)  04/21/2012 4.8   Magnesium (mg/dL)  Date Value  03/31/2019 1.5 (L)   Calcium (mg/dL)  Date Value  03/31/2019 7.0 (L)   Calcium, Total (mg/dL)  Date Value  04/21/2012 9.3   Albumin (g/dL)  Date Value  03/30/2019 2.1 (L)  04/21/2012 3.4   Phosphorus (mg/dL)  Date Value  03/31/2019 3.0   Sodium (mmol/L)  Date Value  03/31/2019 130 (L)  04/21/2012 139     Assessment: 78 year old male admitted with concern for sepsis. Initially required vasopressors which have now been titrated off. Pharmacy consulted for electrolyte management.  Goal of Therapy:  Electrolytes WNL (K ~ 4, Mg ~ 2, phos WNL)  Plan:  K 2.8 - order for 40 mEq PO x 2 doses. With patient's current renal function, will hold off on any additional supplementation. Sodium bicarb tablets likely contributing to hypokalemia.  Mg 1.5 - 4 g IV x 1.  Na 130 - patient is not currently on any fluids. Receiving sodium bicarb 1300 mg TID. Will continue to monitor.  BMP and Mg with morning labs.  Tawnya Crook ,PharmD Clinical Pharmacist 03/31/2019 11:38 AM

## 2019-03-31 NOTE — Progress Notes (Signed)
*  PRELIMINARY RESULTS* Echocardiogram 2D Echocardiogram has been performed.  Bryan Brewer 03/31/2019, 10:55 AM

## 2019-03-31 NOTE — Progress Notes (Signed)
Pharmacy Antibiotic Note  Bryan Brewer is a 78 y.o. male admitted on 03/29/2019 with sepsis (with tachycardia and bandemia). He was found to have pneumonia on imaging and likely UTI, based on urinalysis. Patient became hypotensive was admitted to the ICU requiring pressors. History significant for bladder cancer, COPD, hypertension, and kidney disease. Patient is noted to have acute on chronic renal failure. Serum creatinine has improved slightly but still very high. Patient is afebrile and WBC has decreased. Vanc 1500 mg bolus was given 8/24 at 0009 and vanc level obtained later at 1637 = 19. Pharmacy has been consulted for vancomycin and cefepime dosing.  Plan: Continue cefepime 2g Q24H  Give vancomycin 1000 mg x 1 dose Assess renal function with 8/26 AM labs and draw random vancomycin level if renal function is improving  Height: 5\' 7"  (170.2 cm) Weight: 157 lb 10.1 oz (71.5 kg) IBW/kg (Calculated) : 66.1  Temp (24hrs), Avg:99.1 F (37.3 C), Min:98.1 F (36.7 C), Max:100.6 F (38.1 C)  Recent Labs  Lab 03/29/19 2013 03/30/19 0359 03/30/19 0541 03/30/19 1637 03/31/19 0406  WBC 9.9 10.5  --   --  5.8  CREATININE 4.03* 3.57*  --   --  2.92*  LATICACIDVEN 1.9  --  1.2  --   --   VANCORANDOM  --   --   --  19  --     Estimated Creatinine Clearance: 19.8 mL/min (A) (by C-G formula based on SCr of 2.92 mg/dL (H)).    No Known Allergies  Antimicrobials this admission: Cefepime 08/23 >>  Vancomycin 08/24 >>   Dose adjustments this admission: n/a  Microbiology results: 08/24 MRSA PCR: positive 08/25 Urine Culture: pending  08/25 Expectorated sputum: pending 08/25 BCx: pending  Thank you for allowing pharmacy to be a part of this patient's care.  Jazell Rosenau Dear Nicholes Mango 03/31/2019 11:33 AM

## 2019-03-31 NOTE — Progress Notes (Signed)
[place] at [facility]                                                                                                                                                                                  Patient Demographics   Bryan Brewer, is a 78 y.o. male, DOB - [birth year], ERX:[id number]  Admit date - 03/29/2019   Admitting Physician Lance Coon, MD  Outpatient Primary MD for the patient is Glendon Axe, MD   LOS - 2  Subjective: Patient feeling better Denies any chest pain or shortness of breath   Review of Systems:   CONSTITUTIONAL: No documented fever. No fatigue, weakness. No weight gain, no weight loss.  EYES: No blurry or double vision.  ENT: No tinnitus. No postnasal drip. No redness of the oropharynx.  RESPIRATORY: No cough, no wheeze, no hemoptysis. No dyspnea.  CARDIOVASCULAR: No chest pain. No orthopnea. No palpitations. No syncope.  GASTROINTESTINAL: No nausea, no vomiting or diarrhea. No abdominal pain. No melena or hematochezia.  GENITOURINARY: No dysuria or hematuria.  ENDOCRINE: No polyuria or nocturia. No heat or cold intolerance.  HEMATOLOGY: No anemia. No bruising. No bleeding.  INTEGUMENTARY: No rashes. No lesions.  MUSCULOSKELETAL: No arthritis. No swelling. No gout.  NEUROLOGIC: No numbness, tingling, or ataxia. No seizure-type activity.  PSYCHIATRIC: No anxiety. No insomnia. No ADD.    Vitals:   Vitals:   03/31/19 0752 03/31/19 0800 03/31/19 0900 03/31/19 1200  BP:  (!) 101/57 (!) 102/46 107/61  Pulse:  (!) 106 92 (!) 108  Resp:  (!) 21 17 18   Temp: 98.1 F (36.7 C)     TempSrc: Oral     SpO2:  96% 96% 96%  Weight:      Height:        Wt Readings from Last 3 Encounters:  03/31/19 71.5 kg  02/15/19 73.4 kg  11/01/17 72.5 kg     Intake/Output Summary (Last 24 hours) at 03/31/2019 1444 Last data filed at 03/31/2019 0753 Gross per 24 hour  Intake 1646.84 ml  Output 1975 ml  Net -328.16 ml    Physical Exam:    GENERAL: Pleasant-appearing in no apparent distress.  HEAD, EYES, EARS, NOSE AND THROAT: Atraumatic, normocephalic. Extraocular muscles are intact. Pupils equal and reactive to light. Sclerae anicteric. No conjunctival injection. No oro-pharyngeal erythema.  NECK: Supple. There is no jugular venous distention. No bruits, no lymphadenopathy, no thyromegaly.  HEART: Regular rate and rhythm,. No murmurs, no rubs, no clicks.  LUNGS: Clear to auscultation bilaterally. No rales or rhonchi. No wheezes.  ABDOMEN: Soft, flat, nontender, nondistended. Has good bowel sounds. No hepatosplenomegaly appreciated.  EXTREMITIES: No evidence of any  cyanosis, clubbing, or peripheral edema.  +2 pedal and radial pulses bilaterally.  NEUROLOGIC: The patient is alert, awake, and oriented x3 with no focal motor or sensory deficits appreciated bilaterally.  SKIN: Moist and warm with no rashes appreciated.  Psych: Not anxious, depressed LN: No inguinal LN enlargement    Antibiotics   Anti-infectives (From admission, onward)   Start     Dose/Rate Route Frequency Ordered Stop   03/31/19 1000  vancomycin (VANCOCIN) IVPB 1000 mg/200 mL premix     1,000 mg 200 mL/hr over 60 Minutes Intravenous  Once 03/31/19 0837 03/31/19 1238   03/30/19 2200  ceFEPIme (MAXIPIME) 2 g in sodium chloride 0.9 % 100 mL IVPB     2 g 200 mL/hr over 30 Minutes Intravenous Every 24 hours 03/30/19 0809     03/29/19 2354  vancomycin variable dose per unstable renal function (pharmacist dosing)      Does not apply See admin instructions 03/29/19 2354     03/29/19 2330  vancomycin (VANCOCIN) 1,500 mg in sodium chloride 0.9 % 500 mL IVPB     1,500 mg 250 mL/hr over 120 Minutes Intravenous  Once 03/29/19 2248 03/30/19 0213   03/29/19 2245  vancomycin (VANCOCIN) IVPB 1000 mg/200 mL premix  Status:  Discontinued     1,000 mg 200 mL/hr over 60 Minutes Intravenous  Once 03/29/19 2242 03/29/19 2246   03/29/19 2245  ceFEPIme (MAXIPIME) 2 g in  sodium chloride 0.9 % 100 mL IVPB     2 g 200 mL/hr over 30 Minutes Intravenous  Once 03/29/19 2242 03/29/19 2358      Medications   Scheduled Meds: . aspirin EC  81 mg Oral Daily  . atorvastatin  80 mg Oral q1800  . Chlorhexidine Gluconate Cloth  6 each Topical Daily  . heparin  5,000 Units Subcutaneous Q8H  . insulin aspart  0-9 Units Subcutaneous Q4H  . methylPREDNISolone (SOLU-MEDROL) injection  60 mg Intravenous Q12H  . [START ON 04/01/2019] multivitamin-lutein  1 capsule Oral Daily  . Ensure Max Protein  11 oz Oral BID BM  . sodium bicarbonate  1,300 mg Oral TID  . vancomycin variable dose per unstable renal function (pharmacist dosing)   Does not apply See admin instructions   Continuous Infusions: . sodium chloride    . albumin human 12.5 g (03/31/19 1305)  . ceFEPime (MAXIPIME) IV Stopped (03/30/19 2223)  . norepinephrine (LEVOPHED) Adult infusion Stopped (03/31/19 0705)   PRN Meds:.acetaminophen, ondansetron (ZOFRAN) IV   Data Review:   Micro Results Recent Results (from the past 240 hour(s))  MRSA PCR Screening     Status: Abnormal   Collection Time: 03/30/19  1:31 AM   Specimen: Nasal Mucosa; Nasopharyngeal  Result Value Ref Range Status   MRSA by PCR POSITIVE (A) NEGATIVE Final    Comment:        The GeneXpert MRSA Assay (FDA approved for NASAL specimens only), is one component of a comprehensive MRSA colonization surveillance program. It is not intended to diagnose MRSA infection nor to guide or monitor treatment for MRSA infections. RESULT CALLED TO, READ BACK BY AND VERIFIED WITH: Berna Bue RN AT [phone] ON 03/30/2019 [facility] Performed at [facility], [address] [place], [place] [address]     Radiology Reports Dg Chest Portable 1 View  Result Date: 03/29/2019 CLINICAL DATA:  Weakness EXAM: PORTABLE CHEST 1 VIEW COMPARISON:  02/15/2019, 10/30/2017, 08/09/2016 FINDINGS: Post sternotomy changes. Cardiomegaly. Coarse bilateral  interstitial opacity, suspect for chronic interstitial  change. Streaky superimposed opacity in the left mid and lower lung. Aortic atherosclerosis. No pneumothorax. IMPRESSION: 1. Cardiomegaly. 2. Streaky left mid and lower lung opacities, suspect for acute infectious or inflammatory process superimposed on underlying chronic disease. Electronically Signed   By: Donavan Foil M.D.   On: 03/29/2019 21:16     CBC Recent Labs  Lab 03/29/19 2013 03/30/19 0359 03/31/19 0406  WBC 9.9 10.5 5.8  HGB 9.5* 10.1* 8.3*  HCT 30.0* 31.1* 26.0*  PLT 199 240 162  MCV 85.7 83.6 84.4  MCH 27.1 27.2 26.9  MCHC 31.7 32.5 31.9  RDW 15.4 15.2 15.3  LYMPHSABS 0.1*  --   --   MONOABS 0.6  --   --   EOSABS 0.0  --   --   BASOSABS 0.0  --   --     Chemistries  Recent Labs  Lab 03/29/19 2013 03/30/19 0359 03/31/19 0406  NA 123* 128* 130*  K 3.8 3.0* 2.8*  CL 88* 97* 100  CO2 20* 19* 19*  GLUCOSE 155* 93 73  BUN 90* 78* 64*  CREATININE 4.03* 3.57* 2.92*  CALCIUM 7.5* 7.1* 7.0*  MG  --  1.7 1.5*  AST  --  26  --   ALT  --  14  --   ALKPHOS  --  55  --   BILITOT  --  1.0  --    ------------------------------------------------------------------------------------------------------------------ estimated creatinine clearance is 19.8 mL/min (A) (by C-G formula based on SCr of 2.92 mg/dL (H)). ------------------------------------------------------------------------------------------------------------------ No results for input(s): HGBA1C in the last 72 hours. ------------------------------------------------------------------------------------------------------------------ No results for input(s): CHOL, HDL, LDLCALC, TRIG, CHOLHDL, LDLDIRECT in the last 72 hours. ------------------------------------------------------------------------------------------------------------------ No results for input(s): TSH, T4TOTAL, T3FREE, THYROIDAB in the last 72 hours.  Invalid input(s):  FREET3 ------------------------------------------------------------------------------------------------------------------ No results for input(s): VITAMINB12, FOLATE, FERRITIN, TIBC, IRON, RETICCTPCT in the last 72 hours.  Coagulation profile Recent Labs  Lab 03/30/19 1128  INR 1.3*    No results for input(s): DDIMER in the last 72 hours.  Cardiac Enzymes No results for input(s): CKMB, TROPONINI, MYOGLOBIN in the last 168 hours.  Invalid input(s): CK ------------------------------------------------------------------------------------------------------------------ Invalid input(s): [place]  Patient 78 year old admitted with sepsis    Severe sepsis with septic shock ([place]) - This is due to pneumonia Continue cefepime and vancomycin  Acute on chronic renal failure (HCC) -due to sepsis, dehydration.  Renal functions improved  UTI (urinary tract infection) -IV antibiotics, urine culture as above  Hyponatremia -IV fluids as above  Diabetes type II with renal complications continue sliding scale for now, discussed with the patient regarding close monitoring his blood sugar follow-up with primary care provider and adhering to a carbohydrate consistent diet  HTN (hypertension) -hold antihypertensives as the patient is currently hypotensive on pressors  COPD (chronic obstructive pulmonary disease) ([place]) -continue inhalers patient no longer smokes  HLD (hyperlipidemia) -continue home dose antilipid     Code Status Orders  (From admission, onward)         Start     Ordered   03/30/19 0124  Full code  Continuous     03/30/19 0124        Code Status History    Date Active Date Inactive Code Status Order ID Comments User Context   02/14/2019 2330 02/16/2019 1909 Full Code [id number]  Henreitta Leber, MD Inpatient   10/30/2017 0203 11/01/2017 2259 Full Code [id number]  Lance Coon, MD Inpatient   Advance Care Planning Activity  Consults   intesivist  DVT Prophylaxis  heparin  Lab Results  Component Value Date   PLT 162 03/31/2019     Time Spent in minutes  52min Greater than 50% of time spent in care coordination and counseling patient regarding the condition and plan of care.   Dustin Flock M.D on 03/31/2019 at 2:44 PM  Between 7am to 6pm - Pager - [phone]  After 6pm go to [email or web address] - Proofreader  [employer]   Office  [phone]

## 2019-03-31 NOTE — Progress Notes (Signed)
Hypoglycemic Event  CBG: @0347  CBG 44           @0400  CBG 0400  Treatment: 8 oz apple juice x 2   Follow-up CBG: [id number] CBG Result: 102    Comments/MD notified: Hinton Dyer, NP    Sharion Balloon

## 2019-03-31 NOTE — Progress Notes (Signed)
CRITICAL CARE PROGRESS NOTE    Name: Bryan Brewer MRN: [id number] DOB: Aug 11, 1940     LOS: 2   SUBJECTIVE FINDINGS & SIGNIFICANT EVENTS   Patient description:  78 year old male with extensive comorbid history including bladder CA advanced COPD came in with acute hypoxemic respiratory failure and hypoglycemic episode.  Noted to have acute on chronic renal insufficiency on admission as well as bilateral infiltrates on chest imaging as well as vital signs with severe hypotension suggestive of septic shock.  He received IV fluids and was not responsive and required vasopressor support with Levophed and was transferred to medical intensive care unit for upgrading care.   Lines / Drains: PIV  Cultures / Sepsis markers: Nasal MRSA + Blood cx -pending resp Cx - pending Urine cx - negative  COVID- negative 1 month ago, now pending on this admission.   Antibiotics: Broad spec cefepime & vanco   Protocols / Consultants: none  Tests / Events: cxr   Overnight:  OFF of levophed now.    PAST MEDICAL HISTORY   Past Medical History:  Diagnosis Date  . Bladder cancer ([place])   . COPD (chronic obstructive pulmonary disease) ([place])   . Hyperlipidemia   . Hypertension   . Kidney disease   . Type 2 diabetes mellitus ([place])      SURGICAL HISTORY   Past Surgical History:  Procedure Laterality Date  . LEFT HEART CATH AND CORONARY ANGIOGRAPHY N/A 10/31/2017   Procedure: LEFT HEART CATH AND CORONARY ANGIOGRAPHY;  Surgeon: Yolonda Kida, MD;  Location: [facility];  Service: Cardiovascular;  Laterality: N/A;  . REVISION UROSTOMY CUTANEOUS       FAMILY HISTORY   Family History  Problem Relation Age of Onset  . CAD Mother   . Stroke Mother   . CAD Father   . Stroke Father   . Breast cancer  Sister      SOCIAL HISTORY   Social History   Tobacco Use  . Smoking status: Former Smoker    Packs/day: 1.50    Years: 35.00    Pack years: 52.50    Types: Cigarettes    Quit date: 2013    Years since quitting: 7.6  . Smokeless tobacco: Never Used  Substance Use Topics  . Alcohol use: No    Frequency: Never  . Drug use: Never     MEDICATIONS   Current Medication:  Current Facility-Administered Medications:  .  0.9 %  sodium chloride infusion, 250 mL, Intravenous, Continuous, Ashlynne Shetterly, MD .  acetaminophen (TYLENOL) tablet 650 mg, 650 mg, Oral, Q4H PRN, Lance Coon, MD .  aspirin EC tablet 81 mg, 81 mg, Oral, Daily, Lance Coon, MD, 81 mg at 03/30/19 1135 .  atorvastatin (LIPITOR) tablet 80 mg, 80 mg, Oral, q1800, Lance Coon, MD, 80 mg at 03/30/19 1726 .  ceFEPIme (MAXIPIME) 2 g in sodium chloride 0.9 % 100 mL IVPB, 2 g, Intravenous, Q24H, Tawnya Crook, RPH, Stopped at 03/30/19 2223 .  Chlorhexidine Gluconate Cloth 2 % PADS 6 each, 6 each, Topical, Daily, Ottie Glazier, MD, 6 each at 03/30/19 1917 .  heparin injection 5,000 Units, 5,000 Units, Subcutaneous, Q8H, Lance Coon, MD, 5,000 Units at 03/31/19 [phone] .  insulin aspart (novoLOG) injection 0-9 Units, 0-9 Units, Subcutaneous, Q4H, Lance Coon, MD, 1 Units at 03/30/19 1920 .  magnesium sulfate IVPB 4 g 100 mL, 4 g, Intravenous, Once, Dustin Flock, MD, Last Rate: 50 mL/hr at 03/31/19 0709, 4 g at  03/31/19 0709 .  norepinephrine (LEVOPHED) 4 mg in dextrose 5 % 250 mL (0.016 mg/mL) infusion, 0-10 mcg/min, Intravenous, Continuous, Mumtaz Lovins, MD, Last Rate: 7.5 mL/hr at 03/31/19 0400, 2 mcg/min at 03/31/19 0400 .  ondansetron (ZOFRAN) injection 4 mg, 4 mg, Intravenous, Q6H PRN, Lance Coon, MD .  potassium chloride SA (K-DUR) CR tablet 40 mEq, 40 mEq, Oral, Q4H, Blakeney, Dreama Saa, NP, 40 mEq at 03/31/19 0538 .  sodium bicarbonate tablet 1,300 mg, 1,300 mg, Oral, TID, Lance Coon, MD, 1,300 mg  at 03/30/19 2147 .  vancomycin variable dose per unstable renal function (pharmacist dosing), , Does not apply, See admin instructions, Ena Dawley, [place]    ALLERGIES   Patient has no known allergies.    REVIEW OF SYSTEMS     10 point ROS negative except as per subj findings  PHYSICAL EXAMINATION   Vital Signs: Temp:  [98.1 F (36.7 C)-100.6 F (38.1 C)] 98.1 F (36.7 C) (08/25 0752) Pulse Rate:  [44-175] 123 (08/25 0700) Resp:  [16-25] 16 (08/25 0700) BP: (80-126)/(40-82) 105/49 (08/25 0700) SpO2:  [10 %-100 %] 97 % (08/25 0630) Weight:  [71.5 kg] 71.5 kg (08/25 0350)  GENERAL: mild distress due to SOB HEAD: Normocephalic, atraumatic.  EYES: Pupils equal, round, reactive to light.  No scleral icterus.  MOUTH: Moist mucosal membrane. NECK: Supple. No thyromegaly. No nodules. No JVD.  PULMONARY: bilateral crackles CARDIOVASCULAR: S1 and S2. Regular rate and rhythm. No murmurs, rubs, or gallops.  GASTROINTESTINAL: Soft, nontender, non-distended. No masses. Positive bowel sounds. No hepatosplenomegaly.  MUSCULOSKELETAL: No swelling, clubbing, or edema.  NEUROLOGIC: Mild distress due to acute illness SKIN:intact,warm,dry   PERTINENT DATA     Infusions: . sodium chloride    . ceFEPime (MAXIPIME) IV Stopped (03/30/19 2223)  . magnesium sulfate bolus IVPB 4 g (03/31/19 0709)  . norepinephrine (LEVOPHED) Adult infusion 2 mcg/min (03/31/19 0400)   Scheduled Medications: . aspirin EC  81 mg Oral Daily  . atorvastatin  80 mg Oral q1800  . Chlorhexidine Gluconate Cloth  6 each Topical Daily  . heparin  5,000 Units Subcutaneous Q8H  . insulin aspart  0-9 Units Subcutaneous Q4H  . potassium chloride  40 mEq Oral Q4H  . sodium bicarbonate  1,300 mg Oral TID  . vancomycin variable dose per unstable renal function (pharmacist dosing)   Does not apply See admin instructions   PRN Medications: acetaminophen, ondansetron (ZOFRAN) IV Hemodynamic parameters:    Intake/Output: 08/24 0701 - 08/25 0700 In: 1646.8 [I.V.:1172.8; IV Piggyback:474.1] Out: 2225 [Urine:2225]  Ventilator  Settings:     Other Labs:   LAB RESULTS:  Basic Metabolic Panel: Recent Labs  Lab 03/29/19 2013 03/30/19 0359 03/31/19 0406  NA 123* 128* 130*  K 3.8 3.0* 2.8*  CL 88* 97* 100  CO2 20* 19* 19*  GLUCOSE 155* 93 73  BUN 90* 78* 64*  CREATININE 4.03* 3.57* 2.92*  CALCIUM 7.5* 7.1* 7.0*  MG  --  1.7 1.5*  PHOS  --  5.2* 3.0   Liver Function Tests: Recent Labs  Lab 03/30/19 0359  AST 26  ALT 14  ALKPHOS 55  BILITOT 1.0  PROT 6.0*  ALBUMIN 2.1*   No results for input(s): LIPASE, AMYLASE in the last 168 hours. No results for input(s): AMMONIA in the last 168 hours. CBC: Recent Labs  Lab 03/29/19 2013 03/30/19 0359 03/31/19 0406  WBC 9.9 10.5 5.8  NEUTROABS 9.3*  --   --   HGB 9.5* 10.1* 8.3*  HCT 30.0* 31.1* 26.0*  MCV 85.7 83.6 84.4  PLT 199 240 162   Cardiac Enzymes: No results for input(s): CKTOTAL, CKMB, CKMBINDEX, TROPONINI in the last 168 hours. BNP: Invalid input(s): POCBNP CBG: Recent Labs  Lab 03/30/19 2345 03/31/19 0347 03/31/19 0400 03/31/19 0434 03/31/19 0730  GLUCAP 72 44* 64* 102* 75     IMAGING RESULTS:  Imaging: Dg Chest Portable 1 View  Result Date: 03/29/2019 CLINICAL DATA:  Weakness EXAM: PORTABLE CHEST 1 VIEW COMPARISON:  02/15/2019, 10/30/2017, 08/09/2016 FINDINGS: Post sternotomy changes. Cardiomegaly. Coarse bilateral interstitial opacity, suspect for chronic interstitial change. Streaky superimposed opacity in the left mid and lower lung. Aortic atherosclerosis. No pneumothorax. IMPRESSION: 1. Cardiomegaly. 2. Streaky left mid and lower lung opacities, suspect for acute infectious or inflammatory process superimposed on underlying chronic disease. Electronically Signed   By: Donavan Foil M.D.   On: 03/29/2019 21:16      ASSESSMENT AND PLAN    -Multidisciplinary rounds held today  Acute Hypoxic  Respiratory Failure -unclear etiology as of yet - COVID pending, 1 month ago was neg - resp cx pending - blood cx pending - bilateral infiltrates - differential includes following in this order -  Infectious pna,CHF, May need bronch post empiric tx and septic workup if no etiology is found - Procal neg, was febrile yesterday but defervesced overnight -Requiring low-dose levo fed support unable to give high volume fluid resuscitation due to heart failure with reduced EF at 35% -will add solumedrol due to poss adrenal insufficiency component and less likely but poss amio pulm tox   Advanced COPD  - continue with DuoNebs  - steroids on board for sepsis will help with flare of chronic lung disease and poss amio tox   - PT/OT when able  - [place] pulm follow up     Chronic systolic CHF with EF 22% -possible cardiogenic decompensation due to infectious etiology. Will obtain BNP and plan to repeat TTE for now -will diurese post hemodynamic stabalization -oxygen as needed -Lasix as tolerated -follow up cardiac enzymes as indicated ICU monitoring    Renal Failure-acute on chronic KDIGO 3 -d/c nonessential nephrotoxins - partially related to cardiorenal process -follow chem 7 -follow UO -continue Foley Catheter-assess need daily    Moderate Protein calorie malnutrition  - nutritional evaluation - dietary consultation      ID -continue IV abx as prescibed -follow up cultures  GI/Nutrition GI PROPHYLAXIS as indicated DIET-->TF's as tolerated Constipation protocol as indicated  ENDO - ICU hypoglycemic\Hyperglycemia protocol -check FSBS per protocol   ELECTROLYTES -follow labs as needed -replace as needed -pharmacy consultation   DVT/GI PRX ordered -SCDs  TRANSFUSIONS AS NEEDED MONITOR FSBS ASSESS the need for LABS as needed   Critical care provider statement:    Critical care time (minutes):  33   Critical care time was exclusive of:  Separately billable  procedures and treating other patients   Critical care was necessary to treat or prevent imminent or life-threatening deterioration of the following conditions:  Acute hypoxemic respiratory failure, acute on chronic renal failure, circulatory shock, advanced COPD   Critical care was time spent personally by me on the following activities:  Development of treatment plan with patient or surrogate, discussions with consultants, evaluation of patient's response to treatment, examination of patient, obtaining history from patient or surrogate, ordering and performing treatments and interventions, ordering and review of laboratory studies and re-evaluation of patient's condition.  I assumed direction of critical care for this patient from another  provider in my specialty: no    This document was prepared using Dragon voice recognition software and may include unintentional dictation errors.    Ottie Glazier, M.D.  Division of [place]

## 2019-03-31 NOTE — Progress Notes (Signed)
Initial Nutrition Assessment  RD working remotely.  DOCUMENTATION CODES:   Not applicable  INTERVENTION:  Recommend changing diet from heart healthy to carbohydrate modified.  Provide Ensure Max Protein po BID, each supplement provides 150 kcal and 30 grams of protein.  Provide Ocuvite daily for wound healing (provides zinc, vitamin A, vitamin C, Vitamin E, copper, and selenium).  NUTRITION DIAGNOSIS:   Increased nutrient needs related to wound healing, catabolic illness(COPD, CKD) as evidenced by estimated needs.  GOAL:   Patient will meet greater than or equal to 90% of their needs  MONITOR:   PO intake, Supplement acceptance, Labs, Weight trends, Skin, I & O's  REASON FOR ASSESSMENT:   Consult Assessment of nutrition requirement/status  ASSESSMENT:   78 year old male with PMHx of COPD, bladder cancer, CKD, HTN, HLD, DM type 2 admitted with severe sepsis/septic shock, acute on chronic renal failure, UTI.   Attempted to call patient over the phone for nutrition/weight history but he was unable to answer. He has not previously been assessed by RD in our health system so no history available in chart. No meal completion recorded in chart at this time so unable to determine how patient is eating. Patient's weight history does not have any concerning weight loss. He was 75.8 kg on 10/09/2017 and is currently 71.5 kg (157.63 lbs). He has lost 4.3 kg (5.7% body weight) over >1 year, which is not significant for time frame. Patient has had two large type 6 BMs today. Per skin documentation he has ecchymosis and a stage II pressure injury to sacrum. Patient with elevated BNP and documented moderate pitting edema to bilateral lower extremities. Underwent echo today.  Medications reviewed and include: Novolog 0-9 units Q4hrs, Solu-Medrol 60 mg Q12hrs IV, human albumin 12.5 grams daily IV, cefepime, norepinephrine gtt now off, vancomycin.  Labs reviewed: CBG 44-102, Sodium 130, Potassium  2.8, CO2 19, BUN 64, Creatinine 2.92, Magnesium 1.5, BNP 646.  Unable to determine if patient meets criteria without full nutrition/weight history or completing NFPE.  NUTRITION - FOCUSED PHYSICAL EXAM:  Unable to complete at this time.  Diet Order:   Diet Order            Diet Heart Room service appropriate? Yes; Fluid consistency: Thin  Diet effective now             EDUCATION NEEDS:   No education needs have been identified at this time  Skin:  Skin Assessment: Skin Integrity Issues:(stg II sacrum; ecchymosis)  Last BM:  03/31/2019 - large type 6  Height:   Ht Readings from Last 1 Encounters:  03/30/19 5\' 7"  (1.702 m)   Weight:   Wt Readings from Last 1 Encounters:  03/31/19 71.5 kg   Ideal Body Weight:  67.3 kg  BMI:  Body mass index is 24.69 kg/m.  Estimated Nutritional Needs:   Kcal:  1700-1965  Protein:  85-100 grams  Fluid:  1.8 L/day  Willey Blade, MS, RD, LDN Office: [phone] Pager: [phone] After Hours/Weekend Pager: [phone]

## 2019-04-01 ENCOUNTER — Inpatient Hospital Stay: Payer: Medicare Other

## 2019-04-01 LAB — PROCALCITONIN: Procalcitonin: 13.09 ng/mL

## 2019-04-01 LAB — BASIC METABOLIC PANEL
Anion gap: 10 (ref 5–15)
BUN: 67 mg/dL — ABNORMAL HIGH (ref 8–23)
CO2: 19 mmol/L — ABNORMAL LOW (ref 22–32)
Calcium: 7.5 mg/dL — ABNORMAL LOW (ref 8.9–10.3)
Chloride: 100 mmol/L (ref 98–111)
Creatinine, Ser: 2.76 mg/dL — ABNORMAL HIGH (ref 0.61–1.24)
GFR calc Af Amer: 25 mL/min — ABNORMAL LOW (ref >60)
GFR calc non Af Amer: 21 mL/min — ABNORMAL LOW (ref >60)
Glucose, Bld: 226 mg/dL — ABNORMAL HIGH (ref 70–99)
Potassium: 3.3 mmol/L — ABNORMAL LOW (ref 3.5–5.1)
Sodium: 129 mmol/L — ABNORMAL LOW (ref 135–145)

## 2019-04-01 LAB — CBC
HCT: 25.1 % — ABNORMAL LOW (ref 39.0–52.0)
Hemoglobin: 7.8 g/dL — ABNORMAL LOW (ref 13.0–17.0)
MCH: 26.6 pg (ref 26.0–34.0)
MCHC: 31.1 g/dL (ref 30.0–36.0)
MCV: 85.7 fL (ref 80.0–100.0)
Platelets: 158 10*3/uL (ref 150–400)
RBC: 2.93 MIL/uL — ABNORMAL LOW (ref 4.22–5.81)
RDW: 14.9 % (ref 11.5–15.5)
WBC: 4.9 10*3/uL (ref 4.0–10.5)
nRBC: 0 % (ref 0.0–0.2)

## 2019-04-01 LAB — GLUCOSE, CAPILLARY
Glucose-Capillary: 220 mg/dL — ABNORMAL HIGH (ref 70–99)
Glucose-Capillary: 163 mg/dL — ABNORMAL HIGH (ref 70–99)
Glucose-Capillary: 312 mg/dL — ABNORMAL HIGH (ref 70–99)
Glucose-Capillary: 344 mg/dL — ABNORMAL HIGH (ref 70–99)
Glucose-Capillary: 471 mg/dL — ABNORMAL HIGH (ref 70–99)
Glucose-Capillary: 485 mg/dL — ABNORMAL HIGH (ref 70–99)
Glucose-Capillary: 409 mg/dL — ABNORMAL HIGH (ref 70–99)
Glucose-Capillary: 372 mg/dL — ABNORMAL HIGH (ref 70–99)

## 2019-04-01 LAB — MAGNESIUM: Magnesium: 2.6 mg/dL — ABNORMAL HIGH (ref 1.7–2.4)

## 2019-04-01 LAB — TROPONIN I (HIGH SENSITIVITY)
Troponin I (High Sensitivity): 14 ng/L (ref <18)
Troponin I (High Sensitivity): 14 ng/L (ref <18)

## 2019-04-01 LAB — BRAIN NATRIURETIC PEPTIDE: B Natriuretic Peptide: 762 pg/mL — ABNORMAL HIGH (ref 0.0–100.0)

## 2019-04-01 MED ORDER — POTASSIUM CHLORIDE CRYS ER 20 MEQ PO TBCR
20.0000 meq | EXTENDED_RELEASE_TABLET | Freq: Once | ORAL | Status: AC
  Administered 2019-04-01: 20 meq via ORAL
  Filled 2019-04-01: qty 1

## 2019-04-01 MED ORDER — INSULIN ASPART 100 UNIT/ML ~~LOC~~ SOLN
0.0000 [IU] | Freq: Every day | SUBCUTANEOUS | Status: DC
  Administered 2019-04-01: 22:00:00 5 [IU] via SUBCUTANEOUS
  Administered 2019-04-02 – 2019-04-03 (×2): 4 [IU] via SUBCUTANEOUS
  Filled 2019-04-01 (×3): qty 1

## 2019-04-01 MED ORDER — INSULIN ASPART 100 UNIT/ML ~~LOC~~ SOLN
0.0000 [IU] | Freq: Three times a day (TID) | SUBCUTANEOUS | Status: DC
  Administered 2019-04-01: 20 [IU] via SUBCUTANEOUS
  Administered 2019-04-02: 12:00:00 15 [IU] via SUBCUTANEOUS
  Administered 2019-04-02: 17:00:00 20 [IU] via SUBCUTANEOUS
  Administered 2019-04-02: 7 [IU] via SUBCUTANEOUS
  Administered 2019-04-03 (×3): 15 [IU] via SUBCUTANEOUS
  Administered 2019-04-04: 4 [IU] via SUBCUTANEOUS
  Administered 2019-04-04: 20 [IU] via SUBCUTANEOUS
  Filled 2019-04-01 (×9): qty 1

## 2019-04-01 MED ORDER — METOPROLOL TARTRATE 25 MG PO TABS
12.5000 mg | ORAL_TABLET | Freq: Two times a day (BID) | ORAL | Status: DC
  Administered 2019-04-01 – 2019-04-04 (×5): 12.5 mg via ORAL
  Filled 2019-04-01 (×5): qty 1

## 2019-04-01 MED ORDER — POTASSIUM CHLORIDE CRYS ER 20 MEQ PO TBCR
30.0000 meq | EXTENDED_RELEASE_TABLET | Freq: Once | ORAL | Status: AC
  Administered 2019-04-01: 30 meq via ORAL
  Filled 2019-04-01: qty 2

## 2019-04-01 NOTE — Progress Notes (Signed)
[facility], [place] 04/01/19  Subjective:   08/25 0701 - 08/26 0700 In: 63.9 [I.V.:13.9; IV Piggyback:50] Out: 7867 [EHMCN:4709] Doing fair States eating well UOP is good S Creatinine improved some  Objective:  Vital signs in last 24 hours:  Temp:  [97.4 F (36.3 C)-98.1 F (36.7 C)] 98.1 F (36.7 C) (08/26 0800) Pulse Rate:  [62-108] 88 (08/26 0800) Resp:  [12-23] 18 (08/26 0800) BP: (94-130)/(46-72) 122/69 (08/26 0800) SpO2:  [95 %-100 %] 97 % (08/26 0800) Weight:  [71.1 kg] 71.1 kg (08/26 0452)  Weight change: -0.4 kg Filed Weights   03/30/19 0126 03/31/19 0350 04/01/19 0452  Weight: 70.8 kg 71.5 kg 71.1 kg    Intake/Output:    Intake/Output Summary (Last 24 hours) at 04/01/2019 0849 Last data filed at 04/01/2019 0200 Gross per 24 hour  Intake 63.89 ml  Output 1150 ml  Net -1086.11 ml     Physical Exam:  Gen:                Alert, cooperative, no distress, appears stated age Head:               Normocephalic, without obvious abnormality, atraumatic Eyes/ENT:       conjunctiva/corneas clear,  moist oral mucus membranes Neck:               Supple,  thyroid: not enlarged, no JVD Lungs:             Clear to auscultation bilaterally, respirations unlabored Heart:              Regular rhythm  With ectopic beats, no  rub or gallop Abdomen:        Soft, non-tender, urostomy bag in place Extremities:     no cyanosis, trace edema Skin:                Skin color, texture, turgor normal,scattered ecchymosis  Neurologic:      Alert and oriented, able to answer questions appropriately   Basic Metabolic Panel:  Recent Labs  Lab 03/29/19 2013 03/30/19 0359 03/31/19 0406 04/01/19 0357  NA 123* 128* 130* 129*  K 3.8 3.0* 2.8* 3.3*  CL 88* 97* 100 100  CO2 20* 19* 19* 19*  GLUCOSE 155* 93 73 226*  BUN 90* 78* 64* 67*  CREATININE 4.03* 3.57* 2.92* 2.76*  CALCIUM 7.5* 7.1* 7.0* 7.5*  MG  --  1.7 1.5* 2.6*  PHOS  --  5.2* 3.0  --       CBC: Recent Labs  Lab 03/29/19 2013 03/30/19 0359 03/31/19 0406 04/01/19 0357  WBC 9.9 10.5 5.8 4.9  NEUTROABS 9.3*  --   --   --   HGB 9.5* 10.1* 8.3* 7.8*  HCT 30.0* 31.1* 26.0* 25.1*  MCV 85.7 83.6 84.4 85.7  PLT 199 240 162 158     No results found for: HEPBSAG, HEPBSAB, HEPBIGM    Microbiology:  Recent Results (from the past 240 hour(s))  Urine Culture     Status: None (Preliminary result)   Collection Time: 03/29/19 10:16 PM   Specimen: Urine, Clean Catch  Result Value Ref Range Status   Specimen Description   Final    URINE, CLEAN CATCH Performed at [facility], [address] [place], [place] [address]    Special Requests   Final    NONE Performed at [facility], [address], [place], [place] [address]    Culture   Final  CULTURE REINCUBATED FOR BETTER GROWTH Performed at [facility], [place] [address], [place], [place] [address]    Report Status PENDING  Incomplete  Novel Coronavirus, NAA (send-out to ref lab)     Status: None   Collection Time: 03/30/19 12:43 AM   Specimen: Nasopharyngeal Swab; Respiratory  Result Value Ref Range Status   SARS-CoV-2, NAA NOT DETECTED NOT DETECTED Final    Comment: (NOTE) This test was developed and its performance characteristics determined by [employer]. This test has not been FDA cleared or approved. This test has been authorized by FDA under an Emergency Use Authorization (EUA). This test is only authorized for the duration of time the declaration that circumstances exist justifying the authorization of the emergency use of in vitro diagnostic tests for detection of SARS-CoV-2 virus and/or diagnosis of COVID-19 infection under section 564(b)(1) of the Act, 21 U.S.C. 419FXT-0(W)(4), unless the authorization is terminated or revoked sooner. When diagnostic testing is negative, the possibility of a false negative result should be considered in the context of  a patient's recent exposures and the presence of clinical signs and symptoms consistent with COVID-19. An individual without symptoms of COVID-19 and who is not shedding SARS-CoV-2 virus would expect to have a negative (not detected) result in this assay. Performed  At: [facility] [address] [place], [place] [id number] Rush Farmer MD ME:[id number]    [place]  Final    Comment: Performed at [facility], [address] [place], [place] [address]  MRSA PCR Screening     Status: Abnormal   Collection Time: 03/30/19  1:31 AM   Specimen: Nasal Mucosa; Nasopharyngeal  Result Value Ref Range Status   MRSA by PCR POSITIVE (A) NEGATIVE Final    Comment:        The GeneXpert MRSA Assay (FDA approved for NASAL specimens only), is one component of a comprehensive MRSA colonization surveillance program. It is not intended to diagnose MRSA infection nor to guide or monitor treatment for MRSA infections. RESULT CALLED TO, READ BACK BY AND VERIFIED WITH: Berna Bue RN AT [phone] ON 03/30/2019 [facility] Performed at [facility], [place] [address] [place], [place] [address]   CULTURE, BLOOD (ROUTINE X 2) w Reflex to ID Panel     Status: None (Preliminary result)   Collection Time: 03/31/19  9:33 AM   Specimen: BLOOD  Result Value Ref Range Status   Specimen Description BLOOD BLOOD RIGHT HAND  Final   Special Requests   Final    BOTTLES DRAWN AEROBIC AND ANAEROBIC Blood Culture adequate volume   Culture   Final    NO GROWTH < 24 HOURS Performed at [facility], [address], [place], [place] [address]    Report Status PENDING  Incomplete  CULTURE, BLOOD (ROUTINE X 2) w Reflex to ID Panel     Status: None (Preliminary result)   Collection Time: 03/31/19  9:47 AM   Specimen: BLOOD  Result Value Ref Range Status   Specimen Description BLOOD RIGHT ANTECUBITAL  Final   Special Requests   Final    BOTTLES DRAWN AEROBIC AND  ANAEROBIC Blood Culture results may not be optimal due to an excessive volume of blood received in culture bottles   Culture   Final    NO GROWTH < 24 HOURS Performed at [facility], [address] [place], [place] [address]    Report Status PENDING  Incomplete    Coagulation Studies: Recent Labs  03/30/19 1128  LABPROT 15.6*  INR 1.3*    Urinalysis: Recent Labs    03/29/19 2216  COLORURINE YELLOW*  LABSPEC 1.006  PHURINE 7.0  GLUCOSEU NEGATIVE  HGBUR MODERATE*  BILIRUBINUR NEGATIVE  KETONESUR NEGATIVE  PROTEINUR 30*  NITRITE NEGATIVE  LEUKOCYTESUR LARGE*      Imaging: No results found.   Medications:   . sodium chloride    . albumin human 12.5 g (03/31/19 1305)  . ceFEPime (MAXIPIME) IV Stopped (04/01/19 0732)   . aspirin EC  81 mg Oral Daily  . atorvastatin  80 mg Oral q1800  . Chlorhexidine Gluconate Cloth  6 each Topical Daily  . heparin  5,000 Units Subcutaneous Q8H  . insulin aspart  0-9 Units Subcutaneous Q4H  . methylPREDNISolone (SOLU-MEDROL) injection  60 mg Intravenous Q12H  . multivitamin-lutein  1 capsule Oral Daily  . Ensure Max Protein  11 oz Oral BID BM  . sodium bicarbonate  1,300 mg Oral TID  . vancomycin variable dose per unstable renal function (pharmacist dosing)   Does not apply See admin instructions   acetaminophen, ondansetron (ZOFRAN) IV  Assessment/ Plan:  78 y.o.caucasian male with medical problems of T2DM, CKD st 4, CAD/CABG at [facility],COPD, h/o bladder cancer Dx 2008 s/p chemo, radiation with ileal conduit and urostomy, was admitted on 03/29/2019 with hypoglycemia and fatigue. [facility]  1. AKI on CKD st 4 Cause of AKI is likely secondary to hemodynamic changes.  Creatinine on admission of 4.03 down to 2.7 today.  Outpatient baseline creatinine of 2.9-3.1.   Continue supportive care.  Off pressors now. Cultures are negative.  2. Hypokalemia - replace potassium as needed  3. Metabolic acidosis from CKD -  continue bicarb supplements    LOS: [place] 8/26/20208:49 AM  [place], [place]  Note: This note was prepared with Dragon dictation. Any transcription errors are unintentional

## 2019-04-01 NOTE — Progress Notes (Signed)
Inpatient Diabetes Program Recommendations  AACE/ADA: New Consensus Statement on Inpatient Glycemic Control   Target Ranges:  Prepandial:   less than 140 mg/dL      Peak postprandial:   less than 180 mg/dL (1-2 hours)      Critically ill patients:  140 - 180 mg/dL   Results for Bryan Brewer, Bryan Brewer (MRN [id number]) as of 04/01/2019 15:33  Ref. Range 03/31/2019 04:34 03/31/2019 07:30 03/31/2019 11:42 03/31/2019 15:43 03/31/2019 20:02 03/31/2019 22:06 04/01/2019 00:24 04/01/2019 03:55 04/01/2019 07:11 04/01/2019 11:50  Glucose-Capillary Latest Ref Range: 70 - 99 mg/dL 102 (H) 75 191 (H) 398 (H) 483 (H) 376 (H) 312 (H) 220 (H) 163 (H) 344 (H)   Review of Glycemic Control  Diabetes history: DM2 Outpatient Diabetes medications: Amaryl 2 mg daily Current orders for Inpatient glycemic control: Novolog 0-20 units TID with meals, Novolog 0-5 units QHS; Solumedrol 60 mg Q12H  Inpatient Diabetes Program Recommendations:   Insulin-Basal: If steroids are continued, please consider ordering Lantus 5 units Q24H. If Lantus is ordered, it will need to be adjusted as steroids are tapered off.  Insulin-Meal Coverage: If steroids are continued, please consider ordering Novolog 3 units TID with meals for meal coverage if patient eats at least 50% of meals.  Thanks, Barnie Alderman, RN, MSN, CDE Diabetes Coordinator Inpatient Diabetes Program [phone] (Team Pager from 8am to 5pm)

## 2019-04-01 NOTE — Progress Notes (Signed)
CRITICAL CARE PROGRESS NOTE    Name: Bryan Brewer MRN: [id number] DOB: 1941/07/03     LOS: 3   SUBJECTIVE FINDINGS & SIGNIFICANT EVENTS   Patient description:  78 year old male with extensive comorbid history including bladder CA advanced COPD came in with acute hypoxemic respiratory failure and hypoglycemic episode.  Noted to have acute on chronic renal insufficiency on admission as well as bilateral infiltrates on chest imaging as well as vital signs with severe hypotension suggestive of septic shock.  He received IV fluids and was not responsive and required vasopressor support with Levophed and was transferred to medical intensive care unit for upgrading care.    Overnight: Patient is COVID negative overnight.   He is clinically improved on 2-3L/min [place].  This AM CXR reviewed by me with overal imrovement.   Plan is to optimize medically, upgrade bronchopulmonary hygiene to MetaNeb q4h with saline to recruit atelectatic segments, get patient on his feet out of bed with PT for balance and range of motion evaluation then downgrade to medical floor.     Lines / Drains: PIV  Cultures / Sepsis markers: Nasal MRSA + Blood cx -pending resp Cx - pending Urine cx - negative  COVID- negative 1 month ago, now pending on this admission.   Antibiotics: Broad spec cefepime & vanco   Protocols / Consultants: none  Tests / Events: cxr      PAST MEDICAL HISTORY   Past Medical History:  Diagnosis Date  . Bladder cancer ([place])   . COPD (chronic obstructive pulmonary disease) ([place])   . Hyperlipidemia   . Hypertension   . Kidney disease   . Type 2 diabetes mellitus ([place])      SURGICAL HISTORY   Past Surgical History:  Procedure Laterality Date  . LEFT HEART CATH AND CORONARY ANGIOGRAPHY N/A  10/31/2017   Procedure: LEFT HEART CATH AND CORONARY ANGIOGRAPHY;  Surgeon: Yolonda Kida, MD;  Location: [facility];  Service: Cardiovascular;  Laterality: N/A;  . REVISION UROSTOMY CUTANEOUS       FAMILY HISTORY   Family History  Problem Relation Age of Onset  . CAD Mother   . Stroke Mother   . CAD Father   . Stroke Father   . Breast cancer Sister      SOCIAL HISTORY   Social History   Tobacco Use  . Smoking status: Former Smoker    Packs/day: 1.50    Years: 35.00    Pack years: 52.50    Types: Cigarettes    Quit date: 2013    Years since quitting: 7.6  . Smokeless tobacco: Never Used  Substance Use Topics  . Alcohol use: No    Frequency: Never  . Drug use: Never     MEDICATIONS   Current Medication:  Current Facility-Administered Medications:  .  0.9 %  sodium chloride infusion, 250 mL, Intravenous, Continuous, Johnanna Bakke, MD .  acetaminophen (TYLENOL) tablet 650 mg, 650 mg, Oral, Q4H PRN, Lance Coon, MD .  albumin human 25 % solution 12.5 g, 12.5 g, Intravenous, Daily, Mailani Degroote, MD, Last Rate: 60 mL/hr at 03/31/19 1305, 12.5 g at 03/31/19 1305 .  aspirin EC tablet 81 mg, 81 mg, Oral, Daily, Lance Coon, MD, 81 mg at 03/31/19 0858 .  atorvastatin (LIPITOR) tablet 80 mg, 80 mg, Oral, q1800, Lance Coon, MD, 80 mg at 03/31/19 1655 .  ceFEPIme (MAXIPIME) 2 g in sodium chloride 0.9 % 100 mL IVPB, 2 g, Intravenous, Q24H, [place],  Tobe Sos, RPH, Stopped at 04/01/19 0732 .  Chlorhexidine Gluconate Cloth 2 % PADS 6 each, 6 each, Topical, Daily, Ottie Glazier, MD, 6 each at 03/31/19 0859 .  heparin injection 5,000 Units, 5,000 Units, Subcutaneous, Q8H, Lance Coon, MD, 5,000 Units at 04/01/19 [phone] .  insulin aspart (novoLOG) injection 0-9 Units, 0-9 Units, Subcutaneous, Q4H, Lance Coon, MD, 2 Units at 04/01/19 0740 .  methylPREDNISolone sodium succinate (SOLU-MEDROL) 125 mg/2 mL injection 60 mg, 60 mg, Intravenous, Q12H, Lanney Gins,  Myla Mauriello, MD, 60 mg at 04/01/19 0739 .  multivitamin-lutein (OCUVITE-LUTEIN) capsule 1 capsule, 1 capsule, Oral, Daily, Caylor Cerino, MD .  norepinephrine (LEVOPHED) 4 mg in dextrose 5 % 250 mL (0.016 mg/mL) infusion, 0-10 mcg/min, Intravenous, Continuous, Ottie Glazier, MD, Stopped at 03/31/19 [phone] .  ondansetron (ZOFRAN) injection 4 mg, 4 mg, Intravenous, Q6H PRN, Lance Coon, MD .  protein supplement (ENSURE MAX) liquid, 11 oz, Oral, BID BM, Shatira Dobosz, MD .  sodium bicarbonate tablet 1,300 mg, 1,300 mg, Oral, TID, Lance Coon, MD, 1,300 mg at 03/31/19 2208 .  vancomycin variable dose per unstable renal function (pharmacist dosing), , Does not apply, See admin instructions, Ena Dawley, [place]    ALLERGIES   Patient has no known allergies.    REVIEW OF SYSTEMS     10 point ROS negative except as per subj findings  PHYSICAL EXAMINATION   Vital Signs: Temp:  [97.4 F (36.3 C)-97.7 F (36.5 C)] 97.4 F (36.3 C) (08/26 0200) Pulse Rate:  [62-108] 72 (08/26 0630) Resp:  [12-23] 15 (08/26 0630) BP: (94-130)/(46-72) 123/61 (08/26 0630) SpO2:  [95 %-100 %] 98 % (08/26 0630) Weight:  [71.1 kg] 71.1 kg (08/26 0452)  GENERAL: mild distress due to SOB HEAD: Normocephalic, atraumatic.  EYES: Pupils equal, round, reactive to light.  No scleral icterus.  MOUTH: Moist mucosal membrane. NECK: Supple. No thyromegaly. No nodules. No JVD.  PULMONARY: bilateral crackles CARDIOVASCULAR: S1 and S2. Regular rate and rhythm. No murmurs, rubs, or gallops.  GASTROINTESTINAL: Soft, nontender, non-distended. No masses. Positive bowel sounds. No hepatosplenomegaly.  MUSCULOSKELETAL: No swelling, clubbing, or edema.  NEUROLOGIC: Mild distress due to acute illness SKIN:intact,warm,dry   PERTINENT DATA     Infusions: . sodium chloride    . albumin human 12.5 g (03/31/19 1305)  . ceFEPime (MAXIPIME) IV Stopped (04/01/19 0732)  . norepinephrine (LEVOPHED) Adult infusion Stopped  (03/31/19 0705)   Scheduled Medications: . aspirin EC  81 mg Oral Daily  . atorvastatin  80 mg Oral q1800  . Chlorhexidine Gluconate Cloth  6 each Topical Daily  . heparin  5,000 Units Subcutaneous Q8H  . insulin aspart  0-9 Units Subcutaneous Q4H  . methylPREDNISolone (SOLU-MEDROL) injection  60 mg Intravenous Q12H  . multivitamin-lutein  1 capsule Oral Daily  . Ensure Max Protein  11 oz Oral BID BM  . sodium bicarbonate  1,300 mg Oral TID  . vancomycin variable dose per unstable renal function (pharmacist dosing)   Does not apply See admin instructions   PRN Medications: acetaminophen, ondansetron (ZOFRAN) IV Hemodynamic parameters:   Intake/Output: 08/25 0701 - 08/26 0700 In: 63.9 [I.V.:13.9; IV Piggyback:50] Out: 1350 [FBPZW:2585]  Ventilator  Settings:     Other Labs:   LAB RESULTS:  Basic Metabolic Panel: Recent Labs  Lab 03/29/19 2013 03/30/19 0359 03/31/19 0406 04/01/19 0357  NA 123* 128* 130* 129*  K 3.8 3.0* 2.8* 3.3*  CL 88* 97* 100 100  CO2 20* 19* 19* 19*  GLUCOSE 155* 93 73 226*  BUN 90* 78* 64* 67*  CREATININE 4.03* 3.57* 2.92* 2.76*  CALCIUM 7.5* 7.1* 7.0* 7.5*  MG  --  1.7 1.5* 2.6*  PHOS  --  5.2* 3.0  --    Liver Function Tests: Recent Labs  Lab 03/30/19 0359  AST 26  ALT 14  ALKPHOS 55  BILITOT 1.0  PROT 6.0*  ALBUMIN 2.1*   No results for input(s): LIPASE, AMYLASE in the last 168 hours. No results for input(s): AMMONIA in the last 168 hours. CBC: Recent Labs  Lab 03/29/19 2013 03/30/19 0359 03/31/19 0406 04/01/19 0357  WBC 9.9 10.5 5.8 4.9  NEUTROABS 9.3*  --   --   --   HGB 9.5* 10.1* 8.3* 7.8*  HCT 30.0* 31.1* 26.0* 25.1*  MCV 85.7 83.6 84.4 85.7  PLT 199 240 162 158   Cardiac Enzymes: No results for input(s): CKTOTAL, CKMB, CKMBINDEX, TROPONINI in the last 168 hours. BNP: Invalid input(s): POCBNP CBG: Recent Labs  Lab 03/31/19 2002 03/31/19 2206 04/01/19 0024 04/01/19 0355 04/01/19 0711  GLUCAP 483* 376*  312* 220* 163*     IMAGING RESULTS:  Imaging: No results found.    ASSESSMENT AND PLAN    -Multidisciplinary rounds held today  Acute Hypoxic Respiratory Failure -unclear etiology as of yet - COVID pending, 1 month ago was neg - resp cx pending - blood cx pending - bilateral infiltrates - differential includes following in this order -  Infectious pna,CHF, May need bronch post empiric tx and septic workup if no etiology is found - Procal neg, was febrile yesterday but defervesced overnight -Requiring low-dose levo fed support unable to give high volume fluid resuscitation due to heart failure with reduced EF at 35% -will add solumedrol due to poss adrenal insufficiency component and less likely but poss amio pulm tox   Advanced COPD  - continue with DuoNebs  - steroids on board for sepsis will help with flare of chronic lung disease and poss amio tox   - PT/OT when able  - [facility] pulm follow up     Chronic systolic CHF with EF 82% -possible cardiogenic decompensation due to infectious etiology. Will obtain BNP and plan to repeat TTE for now -will diurese post hemodynamic stabalization -oxygen as needed -Lasix as tolerated -follow up cardiac enzymes as indicated ICU monitoring    Renal Failure-acute on chronic KDIGO 3 -d/c nonessential nephrotoxins - partially related to cardiorenal process -follow chem 7 -follow UO -continue Foley Catheter-assess need daily    Moderate Protein calorie malnutrition  - nutritional evaluation - dietary consultation      ID -continue IV abx as prescibed -follow up cultures  GI/Nutrition GI PROPHYLAXIS as indicated DIET-->TF's as tolerated Constipation protocol as indicated  ENDO - ICU hypoglycemic\Hyperglycemia protocol -check FSBS per protocol   ELECTROLYTES -follow labs as needed -replace as needed -pharmacy consultation   DVT/GI PRX ordered -SCDs  TRANSFUSIONS AS NEEDED MONITOR FSBS ASSESS the need for LABS  as needed   Critical care provider statement:    Critical care time (minutes):  35   Critical care time was exclusive of:  Separately billable procedures and treating other patients   Critical care was necessary to treat or prevent imminent or life-threatening deterioration of the following conditions:  Acute hypoxemic respiratory failure, acute on chronic renal failure, circulatory shock, advanced COPD   Critical care was time spent personally by me on the following activities:  Development of treatment plan with patient or surrogate, discussions with consultants, evaluation of patient's  response to treatment, examination of patient, obtaining history from patient or surrogate, ordering and performing treatments and interventions, ordering and review of laboratory studies and re-evaluation of patient's condition.  I assumed direction of critical care for this patient from another provider in my specialty: no    This document was prepared using Dragon voice recognition software and may include unintentional dictation errors.    Ottie Glazier, M.D.  Division of [place]

## 2019-04-01 NOTE — Progress Notes (Signed)
[place] at [facility]                                                                                                                                                                                  Patient Demographics   Bryan Brewer, is a 78 y.o. male, DOB - 16-Jul-1941, ZOX:[id number]  Admit date - 03/29/2019   Admitting Physician Lance Coon, MD  Outpatient Primary MD for the patient is Glendon Axe, MD   LOS - 3  Subjective: Patient feeling better shortness of breath improved   Review of Systems:   CONSTITUTIONAL: No documented fever. No fatigue, weakness. No weight gain, no weight loss.  EYES: No blurry or double vision.  ENT: No tinnitus. No postnasal drip. No redness of the oropharynx.  RESPIRATORY: No cough, no wheeze, no hemoptysis. No dyspnea.  CARDIOVASCULAR: No chest pain. No orthopnea. No palpitations. No syncope.  GASTROINTESTINAL: No nausea, no vomiting or diarrhea. No abdominal pain. No melena or hematochezia.  GENITOURINARY: No dysuria or hematuria.  ENDOCRINE: No polyuria or nocturia. No heat or cold intolerance.  HEMATOLOGY: No anemia. No bruising. No bleeding.  INTEGUMENTARY: No rashes. No lesions.  MUSCULOSKELETAL: No arthritis. No swelling. No gout.  NEUROLOGIC: No numbness, tingling, or ataxia. No seizure-type activity.  PSYCHIATRIC: No anxiety. No insomnia. No ADD.    Vitals:   Vitals:   04/01/19 0630 04/01/19 0700 04/01/19 0800 04/01/19 1010  BP: 123/61 120/61 122/69 121/63  Pulse: 72 72 88 82  Resp: 15 17 18 18   Temp:   98.1 F (36.7 C)   TempSrc:   Oral   SpO2: 98% 99% 97% 100%  Weight:      Height:        Wt Readings from Last 3 Encounters:  04/01/19 71.1 kg  02/15/19 73.4 kg  11/01/17 72.5 kg     Intake/Output Summary (Last 24 hours) at 04/01/2019 1129 Last data filed at 04/01/2019 0200 Gross per 24 hour  Intake 63.89 ml  Output 1150 ml  Net -1086.11 ml    Physical Exam:   GENERAL:  Pleasant-appearing in no apparent distress.  HEAD, EYES, EARS, NOSE AND THROAT: Atraumatic, normocephalic. Extraocular muscles are intact. Pupils equal and reactive to light. Sclerae anicteric. No conjunctival injection. No oro-pharyngeal erythema.  NECK: Supple. There is no jugular venous distention. No bruits, no lymphadenopathy, no thyromegaly.  HEART: Regular rate and rhythm,. No murmurs, no rubs, no clicks.  LUNGS: Clear to auscultation bilaterally. No rales or rhonchi. No wheezes.  ABDOMEN: Soft, flat, nontender, nondistended. Has good bowel sounds. No hepatosplenomegaly appreciated.  EXTREMITIES: No evidence of any cyanosis, clubbing, or peripheral edema.  +2 pedal and  radial pulses bilaterally.  NEUROLOGIC: The patient is alert, awake, and oriented x3 with no focal motor or sensory deficits appreciated bilaterally.  SKIN: Moist and warm with no rashes appreciated.  Psych: Not anxious, depressed LN: No inguinal LN enlargement    Antibiotics   Anti-infectives (From admission, onward)   Start     Dose/Rate Route Frequency Ordered Stop   03/31/19 1000  vancomycin (VANCOCIN) IVPB 1000 mg/200 mL premix     1,000 mg 200 mL/hr over 60 Minutes Intravenous  Once 03/31/19 0837 03/31/19 1238   03/30/19 2200  ceFEPIme (MAXIPIME) 2 g in sodium chloride 0.9 % 100 mL IVPB     2 g 200 mL/hr over 30 Minutes Intravenous Every 24 hours 03/30/19 0809     03/29/19 2354  vancomycin variable dose per unstable renal function (pharmacist dosing)      Does not apply See admin instructions 03/29/19 2354     03/29/19 2330  vancomycin (VANCOCIN) 1,500 mg in sodium chloride 0.9 % 500 mL IVPB     1,500 mg 250 mL/hr over 120 Minutes Intravenous  Once 03/29/19 2248 03/30/19 0213   03/29/19 2245  vancomycin (VANCOCIN) IVPB 1000 mg/200 mL premix  Status:  Discontinued     1,000 mg 200 mL/hr over 60 Minutes Intravenous  Once 03/29/19 2242 03/29/19 2246   03/29/19 2245  ceFEPIme (MAXIPIME) 2 g in sodium chloride  0.9 % 100 mL IVPB     2 g 200 mL/hr over 30 Minutes Intravenous  Once 03/29/19 2242 03/29/19 2358      Medications   Scheduled Meds: . aspirin EC  81 mg Oral Daily  . atorvastatin  80 mg Oral q1800  . Chlorhexidine Gluconate Cloth  6 each Topical Daily  . heparin  5,000 Units Subcutaneous Q8H  . insulin aspart  0-9 Units Subcutaneous Q4H  . methylPREDNISolone (SOLU-MEDROL) injection  60 mg Intravenous Q12H  . multivitamin-lutein  1 capsule Oral Daily  . Ensure Max Protein  11 oz Oral BID BM  . sodium bicarbonate  1,300 mg Oral TID  . vancomycin variable dose per unstable renal function (pharmacist dosing)   Does not apply See admin instructions   Continuous Infusions: . sodium chloride    . albumin human 12.5 g (04/01/19 0930)  . ceFEPime (MAXIPIME) IV Stopped (04/01/19 0732)   PRN Meds:.acetaminophen, ondansetron (ZOFRAN) IV   Data Review:   Micro Results Recent Results (from the past 240 hour(s))  Urine Culture     Status: None (Preliminary result)   Collection Time: 03/29/19 10:16 PM   Specimen: Urine, Clean Catch  Result Value Ref Range Status   Specimen Description   Final    URINE, CLEAN CATCH Performed at [facility], [address] [place], [place] [address]    Special Requests   Final    NONE Performed at [facility], [address] [place], [place] [address]    Culture   Final    CULTURE REINCUBATED FOR BETTER GROWTH Performed at [facility], [place] [address], [place], [place] [address]    Report Status PENDING  Incomplete  Novel Coronavirus, NAA (send-out to ref lab)     Status: None   Collection Time: 03/30/19 12:43 AM   Specimen: Nasopharyngeal Swab; Respiratory  Result Value Ref Range Status   SARS-CoV-2, NAA NOT DETECTED NOT DETECTED Final    Comment: (NOTE) This test was developed and its performance characteristics determined by [employer]. This test has not been FDA  cleared or approved. This test has  been authorized by FDA under an Emergency Use Authorization (EUA). This test is only authorized for the duration of time the declaration that circumstances exist justifying the authorization of the emergency use of in vitro diagnostic tests for detection of SARS-CoV-2 virus and/or diagnosis of COVID-19 infection under section 564(b)(1) of the Act, 21 U.S.C. 240XBD-5(H)(2), unless the authorization is terminated or revoked sooner. When diagnostic testing is negative, the possibility of a false negative result should be considered in the context of a patient's recent exposures and the presence of clinical signs and symptoms consistent with COVID-19. An individual without symptoms of COVID-19 and who is not shedding SARS-CoV-2 virus would expect to have a negative (not detected) result in this assay. Performed  At: [facility] [address] [place], [place] [id number] Rush Farmer MD HD:[id number]    [place]  Final    Comment: Performed at [facility], [address] [place], [place] [address]  MRSA PCR Screening     Status: Abnormal   Collection Time: 03/30/19  1:31 AM   Specimen: Nasal Mucosa; Nasopharyngeal  Result Value Ref Range Status   MRSA by PCR POSITIVE (A) NEGATIVE Final    Comment:        The GeneXpert MRSA Assay (FDA approved for NASAL specimens only), is one component of a comprehensive MRSA colonization surveillance program. It is not intended to diagnose MRSA infection nor to guide or monitor treatment for MRSA infections. RESULT CALLED TO, READ BACK BY AND VERIFIED WITH: Berna Bue RN AT [phone] ON 03/30/2019 [facility] Performed at [facility], [address] [place], [place] [address]   CULTURE, BLOOD (ROUTINE X 2) w Reflex to ID Panel     Status: None (Preliminary result)   Collection Time: 03/31/19  9:33 AM   Specimen: BLOOD  Result Value Ref Range Status   Specimen Description BLOOD BLOOD RIGHT HAND   Final   Special Requests   Final    BOTTLES DRAWN AEROBIC AND ANAEROBIC Blood Culture adequate volume   Culture   Final    NO GROWTH < 24 HOURS Performed at [facility], [address], [place], [place] [address]    Report Status PENDING  Incomplete  CULTURE, BLOOD (ROUTINE X 2) w Reflex to ID Panel     Status: None (Preliminary result)   Collection Time: 03/31/19  9:47 AM   Specimen: BLOOD  Result Value Ref Range Status   Specimen Description BLOOD RIGHT ANTECUBITAL  Final   Special Requests   Final    BOTTLES DRAWN AEROBIC AND ANAEROBIC Blood Culture results may not be optimal due to an excessive volume of blood received in culture bottles   Culture   Final    NO GROWTH < 24 HOURS Performed at [facility], [address] [place], [place] [address]    Report Status PENDING  Incomplete    Radiology Reports Dg Chest Port 1 View  Result Date: 04/01/2019 CLINICAL DATA:  Pulmonary disease. EXAM: PORTABLE CHEST 1 VIEW COMPARISON:  03/29/2019 and 02/15/2019 and October 29, 2017 FINDINGS: The heart size and pulmonary vascularity are normal. CABG. The accentuation of the interstitial markings has returned to baseline since the prior study. The patient has bilateral chronic interstitial disease with no acute abnormalities. No effusions. No significant bone abnormality. IMPRESSION: No acute cardiopulmonary disease. Chronic interstitial lung disease. Electronically Signed   By: Lorriane Shire M.D.   On: 04/01/2019 09:18  Dg Chest Portable 1 View  Result Date: 03/29/2019 CLINICAL DATA:  Weakness EXAM: PORTABLE CHEST 1 VIEW COMPARISON:  02/15/2019, 10/30/2017, 08/09/2016 FINDINGS: Post sternotomy changes. Cardiomegaly. Coarse bilateral interstitial opacity, suspect for chronic interstitial change. Streaky superimposed opacity in the left mid and lower lung. Aortic atherosclerosis. No pneumothorax. IMPRESSION: 1. Cardiomegaly. 2. Streaky left mid and lower lung opacities, suspect  for acute infectious or inflammatory process superimposed on underlying chronic disease. Electronically Signed   By: Donavan Foil M.D.   On: 03/29/2019 21:16     CBC Recent Labs  Lab 03/29/19 2013 03/30/19 0359 03/31/19 0406 04/01/19 0357  WBC 9.9 10.5 5.8 4.9  HGB 9.5* 10.1* 8.3* 7.8*  HCT 30.0* 31.1* 26.0* 25.1*  PLT 199 240 162 158  MCV 85.7 83.6 84.4 85.7  MCH 27.1 27.2 26.9 26.6  MCHC 31.7 32.5 31.9 31.1  RDW 15.4 15.2 15.3 14.9  LYMPHSABS 0.1*  --   --   --   MONOABS 0.6  --   --   --   EOSABS 0.0  --   --   --   BASOSABS 0.0  --   --   --     Chemistries  Recent Labs  Lab 03/29/19 2013 03/30/19 0359 03/31/19 0406 04/01/19 0357  NA 123* 128* 130* 129*  K 3.8 3.0* 2.8* 3.3*  CL 88* 97* 100 100  CO2 20* 19* 19* 19*  GLUCOSE 155* 93 73 226*  BUN 90* 78* 64* 67*  CREATININE 4.03* 3.57* 2.92* 2.76*  CALCIUM 7.5* 7.1* 7.0* 7.5*  MG  --  1.7 1.5* 2.6*  AST  --  26  --   --   ALT  --  14  --   --   ALKPHOS  --  55  --   --   BILITOT  --  1.0  --   --    ------------------------------------------------------------------------------------------------------------------ estimated creatinine clearance is 21 mL/min (A) (by C-G formula based on SCr of 2.76 mg/dL (H)). ------------------------------------------------------------------------------------------------------------------ No results for input(s): HGBA1C in the last 72 hours. ------------------------------------------------------------------------------------------------------------------ No results for input(s): CHOL, HDL, LDLCALC, TRIG, CHOLHDL, LDLDIRECT in the last 72 hours. ------------------------------------------------------------------------------------------------------------------ No results for input(s): TSH, T4TOTAL, T3FREE, THYROIDAB in the last 72 hours.  Invalid input(s): FREET3 ------------------------------------------------------------------------------------------------------------------ No  results for input(s): VITAMINB12, FOLATE, FERRITIN, TIBC, IRON, RETICCTPCT in the last 72 hours.  Coagulation profile Recent Labs  Lab 03/30/19 1128  INR 1.3*    No results for input(s): DDIMER in the last 72 hours.  Cardiac Enzymes No results for input(s): CKMB, TROPONINI, MYOGLOBIN in the last 168 hours.  Invalid input(s): CK ------------------------------------------------------------------------------------------------------------------ Invalid input(s): [place]  Patient 78 year old admitted with sepsis   Severe sepsis with septic shock ([place]) - This is due to pneumonia-repeat chest x-ray is negative Continue cefepime and vancomycin Wean oxygen Check procalcitonin   Acute on chronic renal failure (HCC) -due to sepsis, dehydration.  Renal functions improved  UTI (urinary tract infection) -IV antibiotics, urine culture re-cultured  Hyponatremia -follow sodium Bar[place]o  Diabetes type II with renal complications continue sliding scale for now, discussed with the patient regarding close monitoring his blood sugar follow-up with primary care provider and adhering to a carbohydrate consistent diet  HTN (hypertension) -hold antihypertensives as the patient is currently hypotensive on pressors  COPD (chronic obstructive pulmonary disease) ([place]) -continue inhalers patient no longer smokes  HLD (hyperlipidemia) -continue home dose antilipid     Code Status Orders  (From admission, onward)  Start     Ordered   03/30/19 0124  Full code  Continuous     03/30/19 0124        Code Status History    Date Active Date Inactive Code Status Order ID Comments User Context   02/14/2019 2330 02/16/2019 1909 Full Code [id number]  Henreitta Leber, MD Inpatient   10/30/2017 0203 11/01/2017 2259 Full Code [id number]  Lance Coon, MD Inpatient   Advance Care Planning Activity           Consults  intesivist  DVT Prophylaxis  heparin  Lab Results   Component Value Date   PLT 158 04/01/2019     Time Spent in minutes  2min Greater than 50% of time spent in care coordination and counseling patient regarding the condition and plan of care.   Dustin Flock M.D on 04/01/2019 at 11:29 AM  Between 7am to 6pm - Pager - [phone]  After 6pm go to [email or web address] - Proofreader  [employer]   Office  [phone]

## 2019-04-01 NOTE — Progress Notes (Signed)
PHARMACY CONSULT NOTE - FOLLOW UP  Pharmacy Consult for Electrolyte Monitoring and Replacement   Recent Labs: Potassium (mmol/L)  Date Value  04/01/2019 3.3 (L)  04/21/2012 4.8   Magnesium (mg/dL)  Date Value  04/01/2019 2.6 (H)   Calcium (mg/dL)  Date Value  04/01/2019 7.5 (L)   Calcium, Total (mg/dL)  Date Value  04/21/2012 9.3   Albumin (g/dL)  Date Value  03/30/2019 2.1 (L)  04/21/2012 3.4   Phosphorus (mg/dL)  Date Value  03/31/2019 3.0   Sodium (mmol/L)  Date Value  04/01/2019 129 (L)  04/21/2012 139     Assessment: 78 year old male admitted with concern for sepsis. Initially required vasopressors which have now been titrated off. Pharmacy consulted for electrolyte management.  Goal of Therapy:  Electrolytes WNL (K ~ 4, Mg ~ 2, phos WNL)  Plan:  K 3.3 - Potassium 30 mEq PO x 1 given this morning. Sodium bicarb tablets likely contributing to hypokalemia. Will give an additional 20 mEq PO x 1.  Mg 2.6 - no supplementation at this time.  Na 129 - patient is not currently on any fluids. Na remains stable around 129-130. Receiving sodium bicarb 1300 mg TID. Will continue to monitor.  BMP with morning labs.  Tawnya Crook ,PharmD Clinical Pharmacist 04/01/2019 11:40 AM

## 2019-04-01 NOTE — Progress Notes (Signed)
Report given to [place], [place].  All questions answered.

## 2019-04-01 NOTE — Evaluation (Signed)
Physical Therapy Evaluation Patient Details Name: Bryan Brewer MRN: [id number] DOB: 02/27/1941 Today's Date: 04/01/2019   History of Present Illness  presented to ER secondary to worsening cough, general malaise, AMS; admitted for management of sepsis and septic shock related to UTI, PNA.  Clinical Impression  Upon evaluation, patient alert and oriented; follows commands and demonstrates good effort with all mobility tasks.  Bilat UE/LE strength and ROM grossly symmetrical and WFL; no focal weakness, pain reported.  Able to complete bed mobility with mod indep sit/stand, basic transfers and gait (200') with RW, cga/min assist.  Min cuing for walker position/management, but fair stability without overt buckling or LOB.  Does demonstrate limited functional standing balance (evidenced by poor functional reach); do recommend continued use of RW for all mobility at this time. Of note, patient weaned to RA during session (per direction of primary RN); tolerating with sats >94% at rest and with exertion.  Left on RA end of session. RN informed/aware. Would benefit from skilled PT to address above deficits and promote optimal return to PLOF; Recommend transition to [place] upon discharge from acute hospitalization.     Follow Up Recommendations Home health PT    Equipment Recommendations  Rolling walker with 5" wheels    Recommendations for Other Services       Precautions / Restrictions Precautions Precautions: Fall Precaution Comments: LLQ urostomy Restrictions Weight Bearing Restrictions: No      Mobility  Bed Mobility Overal bed mobility: Modified Independent                Transfers Overall transfer level: Needs assistance Equipment used: None;Rolling walker (2 wheeled) Transfers: Sit to/from Stand Sit to Stand: Min assist;Min guard            Ambulation/Gait Ambulation/Gait assistance: Min assist;Min guard Gait Distance (Feet): 200 Feet Assistive device: Rolling  walker (2 wheeled)   Gait velocity: 10' walk time, 10 seconds   General Gait Details: reciprocal stepping pattern with fair cadence; min cuing for walker position and management.  Forward flexed posture.  Fair stability with head turns, changes of direction.  Stairs            Wheelchair Mobility    Modified Rankin (Stroke Patients Only)       Balance Overall balance assessment: Needs assistance Sitting-balance support: No upper extremity supported;Feet supported Sitting balance-Leahy Scale: Good     Standing balance support: Bilateral upper extremity supported Standing balance-Leahy Scale: Fair                               Pertinent Vitals/Pain Pain Assessment: No/denies pain    Home Living Family/patient expects to be discharged to:: Private residence Living Arrangements: Spouse/significant other Available Help at Discharge: Family;Available 24 hours/day Type of Home: House Home Access: Stairs to enter Entrance Stairs-Rails: None Entrance Stairs-Number of Steps: 2 Home Layout: Able to live on main level with bedroom/bathroom Home Equipment: Cane - single point;Walker - 2 wheels;Walker - 4 wheels      Prior Function Level of Independence: Independent         Comments: Indep without assist device for ADLs, household and community mobilization at baseline; recent use of SPC due to weakness in recent weeks. Endorses single fall within previous six months.     Hand Dominance        Extremity/Trunk Assessment   Upper Extremity Assessment Upper Extremity Assessment: Overall WFL for tasks assessed  Lower Extremity Assessment Lower Extremity Assessment: Overall WFL for tasks assessed       Communication   Communication: HOH  Cognition Arousal/Alertness: Awake/alert Behavior During Therapy: WFL for tasks assessed/performed Overall Cognitive Status: Within Functional Limits for tasks assessed                                         General Comments      Exercises Other Exercises Other Exercises: Sit/stand with (cga) and without (min assist) device.  Emphasis on safety needs; recommend continued use of RW Other Exercises: Standing balance for functional activities (emptying urostomy), cga/min assist.  Limited functional reach outside immediate BOS, requiring cga/min assist and contralateral UE support for stabilization/ safety.   Assessment/Plan    PT Assessment Patient needs continued PT services  PT Problem List Decreased strength;Decreased activity tolerance;Decreased balance;Decreased mobility;Cardiopulmonary status limiting activity;Decreased safety awareness       PT Treatment Interventions DME instruction;Balance training;Stair training;Therapeutic activities;Gait training;Functional mobility training;Therapeutic exercise;Patient/family education    PT Goals (Current goals can be found in the Care Plan section)  Acute Rehab PT Goals Patient Stated Goal: to go home! PT Goal Formulation: With patient/family Time For Goal Achievement: 04/15/19 Potential to Achieve Goals: Good    Frequency Min 2X/week   Barriers to discharge        Co-evaluation               AM-PAC PT "6 Clicks" Mobility  Outcome Measure Help needed turning from your back to your side while in a flat bed without using bedrails?: None Help needed moving from lying on your back to sitting on the side of a flat bed without using bedrails?: None Help needed moving to and from a bed to a chair (including a wheelchair)?: A Little Help needed standing up from a chair using your arms (e.g., wheelchair or bedside chair)?: A Little Help needed to walk in hospital room?: A Little Help needed climbing 3-5 steps with a railing? : A Little 6 Click Score: 20    End of Session Equipment Utilized During Treatment: Gait belt Activity Tolerance: Patient tolerated treatment well Patient left: in bed;with bed alarm set;with  nursing/sitter in room;with family/visitor present Nurse Communication: Mobility status PT Visit Diagnosis: Muscle weakness (generalized) (M62.81);Difficulty in walking, not elsewhere classified (R26.2)    Time: 3300-7622 PT Time Calculation (min) (ACUTE ONLY): 34 min   Charges:   PT Evaluation $PT Eval Moderate Complexity: 1 Mod PT Treatments $Therapeutic Activity: 8-22 mins        Reegan Bouffard H. Owens Shark, PT, DPT, NCS 04/01/19, 9:09 PM [phone]

## 2019-04-02 ENCOUNTER — Inpatient Hospital Stay: Payer: Medicare Other

## 2019-04-02 LAB — BASIC METABOLIC PANEL
Anion gap: 10 (ref 5–15)
BUN: 81 mg/dL — ABNORMAL HIGH (ref 8–23)
CO2: 19 mmol/L — ABNORMAL LOW (ref 22–32)
Calcium: 7.7 mg/dL — ABNORMAL LOW (ref 8.9–10.3)
Chloride: 102 mmol/L (ref 98–111)
Creatinine, Ser: 2.6 mg/dL — ABNORMAL HIGH (ref 0.61–1.24)
GFR calc Af Amer: 26 mL/min — ABNORMAL LOW (ref >60)
GFR calc non Af Amer: 23 mL/min — ABNORMAL LOW (ref >60)
Glucose, Bld: 259 mg/dL — ABNORMAL HIGH (ref 70–99)
Potassium: 3.8 mmol/L (ref 3.5–5.1)
Sodium: 131 mmol/L — ABNORMAL LOW (ref 135–145)

## 2019-04-02 LAB — VANCOMYCIN, RANDOM: Vancomycin Rm: 20

## 2019-04-02 LAB — GLUCOSE, CAPILLARY
Glucose-Capillary: 223 mg/dL — ABNORMAL HIGH (ref 70–99)
Glucose-Capillary: 340 mg/dL — ABNORMAL HIGH (ref 70–99)
Glucose-Capillary: 361 mg/dL — ABNORMAL HIGH (ref 70–99)
Glucose-Capillary: 349 mg/dL — ABNORMAL HIGH (ref 70–99)

## 2019-04-02 MED ORDER — GLIMEPIRIDE 2 MG PO TABS
2.0000 mg | ORAL_TABLET | Freq: Every day | ORAL | Status: DC
  Administered 2019-04-03 – 2019-04-04 (×2): 2 mg via ORAL
  Filled 2019-04-02 (×2): qty 1

## 2019-04-02 MED ORDER — VANCOMYCIN HCL 1.25 G IV SOLR
1250.0000 mg | INTRAVENOUS | Status: DC
  Administered 2019-04-02: 1250 mg via INTRAVENOUS
  Filled 2019-04-02: qty 1250

## 2019-04-02 MED ORDER — GUAIFENESIN ER 600 MG PO TB12
600.0000 mg | ORAL_TABLET | Freq: Two times a day (BID) | ORAL | Status: DC
  Administered 2019-04-02 – 2019-04-04 (×5): 600 mg via ORAL
  Filled 2019-04-02 (×6): qty 1

## 2019-04-02 MED ORDER — IPRATROPIUM-ALBUTEROL 0.5-2.5 (3) MG/3ML IN SOLN
3.0000 mL | Freq: Four times a day (QID) | RESPIRATORY_TRACT | Status: DC
  Administered 2019-04-02 – 2019-04-03 (×7): 3 mL via RESPIRATORY_TRACT
  Filled 2019-04-02 (×7): qty 3

## 2019-04-02 MED ORDER — MUPIROCIN 2 % EX OINT
1.0000 "application " | TOPICAL_OINTMENT | Freq: Two times a day (BID) | CUTANEOUS | Status: DC
  Administered 2019-04-02 – 2019-04-04 (×5): 1 via NASAL
  Filled 2019-04-02: qty 22

## 2019-04-02 MED ORDER — BUDESONIDE 0.25 MG/2ML IN SUSP
0.2500 mg | Freq: Two times a day (BID) | RESPIRATORY_TRACT | Status: DC
  Administered 2019-04-02 – 2019-04-04 (×5): 0.25 mg via RESPIRATORY_TRACT
  Filled 2019-04-02 (×5): qty 2

## 2019-04-02 MED ORDER — VANCOMYCIN HCL 10 G IV SOLR
1250.0000 mg | INTRAVENOUS | Status: DC
  Filled 2019-04-02: qty 1250

## 2019-04-02 MED ORDER — SODIUM CHLORIDE 0.9 % IV SOLN
500.0000 mg | Freq: Two times a day (BID) | INTRAVENOUS | Status: DC
  Administered 2019-04-02 (×2): 500 mg via INTRAVENOUS
  Filled 2019-04-02 (×3): qty 0.5
  Filled 2019-04-02 (×2): qty 500

## 2019-04-02 MED ORDER — VANCOMYCIN HCL 1.25 G IV SOLR
1250.0000 mg | INTRAVENOUS | Status: DC
  Filled 2019-04-02: qty 1250

## 2019-04-02 MED ORDER — ACETYLCYSTEINE 20 % IN SOLN
4.0000 mL | Freq: Two times a day (BID) | RESPIRATORY_TRACT | Status: AC
  Administered 2019-04-02 – 2019-04-03 (×4): 4 mL via RESPIRATORY_TRACT
  Filled 2019-04-02 (×4): qty 4

## 2019-04-02 MED ORDER — SODIUM CHLORIDE 0.9 % IV SOLN
INTRAVENOUS | Status: DC | PRN
  Administered 2019-04-02: 20 mL via INTRAVENOUS

## 2019-04-02 MED ORDER — VANCOMYCIN HCL 10 G IV SOLR
1250.0000 mg | INTRAVENOUS | Status: DC
  Filled 2019-04-02 (×2): qty 1250

## 2019-04-02 NOTE — Progress Notes (Addendum)
[name] at [facility]                                                                                                                                                                                  Patient Demographics   Bryan Brewer, is a 79 y.o. male, DOB - 1941/03/08, ZSW:[id number]  Admit date - 03/29/2019   Admitting Physician Lance Coon, MD  Outpatient Primary MD for the patient is Glendon Axe, MD   LOS - 4  Subjective: Patient continues to complain of shortness of breath and cough   Review of Systems:   CONSTITUTIONAL: No documented fever. No fatigue, weakness. No weight gain, no weight loss.  EYES: No blurry or double vision.  ENT: No tinnitus. No postnasal drip. No redness of the oropharynx.  RESPIRATORY: Positive cough, no wheeze, no hemoptysis.  Positive dyspnea.  CARDIOVASCULAR: No chest pain. No orthopnea. No palpitations. No syncope.  GASTROINTESTINAL: No nausea, no vomiting or diarrhea. No abdominal pain. No melena or hematochezia.  GENITOURINARY: No dysuria or hematuria.  ENDOCRINE: No polyuria or nocturia. No heat or cold intolerance.  HEMATOLOGY: No anemia. No bruising. No bleeding.  INTEGUMENTARY: No rashes. No lesions.  MUSCULOSKELETAL: No arthritis. No swelling. No gout.  NEUROLOGIC: No numbness, tingling, or ataxia. No seizure-type activity.  PSYCHIATRIC: No anxiety. No insomnia. No ADD.    Vitals:   Vitals:   04/01/19 1945 04/01/19 2058 04/02/19 0441 04/02/19 1040  BP: 122/68  116/65   Pulse: 79  77   Resp: 18  16   Temp: (!) 97.4 F (36.3 C)  97.6 F (36.4 C)   TempSrc: Oral     SpO2: 100% 94% 96% 97%  Weight:      Height:        Wt Readings from Last 3 Encounters:  04/01/19 71.1 kg  02/15/19 73.4 kg  11/01/17 72.5 kg     Intake/Output Summary (Last 24 hours) at 04/02/2019 1328 Last data filed at 04/02/2019 1311 Gross per 24 hour  Intake 391.38 ml  Output 400 ml  Net -8.62 ml    Physical Exam:    GENERAL: Pleasant-appearing in no apparent distress.  HEAD, EYES, EARS, NOSE AND THROAT: Atraumatic, normocephalic. Extraocular muscles are intact. Pupils equal and reactive to light. Sclerae anicteric. No conjunctival injection. No oro-pharyngeal erythema.  NECK: Supple. There is no jugular venous distention. No bruits, no lymphadenopathy, no thyromegaly.  HEART: Regular rate and rhythm,. No murmurs, no rubs, no clicks.  LUNGS: Clear to auscultation bilaterally. No rales or rhonchi. No wheezes.  ABDOMEN: Soft, flat, nontender, nondistended. Has good bowel sounds. No hepatosplenomegaly appreciated.  EXTREMITIES: No evidence of any cyanosis,  clubbing, or peripheral edema.  +2 pedal and radial pulses bilaterally.  NEUROLOGIC: The patient is alert, awake, and oriented x3 with no focal motor or sensory deficits appreciated bilaterally.  SKIN: Moist and warm with no rashes appreciated.  Psych: Not anxious, depressed LN: No inguinal LN enlargement    Antibiotics   Anti-infectives (From admission, onward)   Start     Dose/Rate Route Frequency Ordered Stop   04/02/19 2200  vancomycin (VANCOCIN) 1,250 mg in sodium chloride 0.9 % 250 mL IVPB     1,250 mg 166.7 mL/hr over 90 Minutes Intravenous Every 48 hours 04/02/19 0902     04/02/19 1000  vancomycin (VANCOCIN) 1,250 mg in sodium chloride 0.9 % 250 mL IVPB  Status:  Discontinued     1,250 mg 166.7 mL/hr over 90 Minutes Intravenous Every 48 hours 04/02/19 0747 04/02/19 0756   04/02/19 1000  vancomycin (VANCOCIN) 1,250 mg in sodium chloride 0.9 % 250 mL IVPB  Status:  Discontinued     1,250 mg 166.7 mL/hr over 90 Minutes Intravenous Every 48 hours 04/02/19 0752 04/02/19 0902   04/02/19 1000  meropenem (MERREM) 500 mg in sodium chloride 0.9 % 100 mL IVPB     500 mg 200 mL/hr over 30 Minutes Intravenous Every 12 hours 04/02/19 0922     03/31/19 1000  vancomycin (VANCOCIN) IVPB 1000 mg/200 mL premix     1,000 mg 200 mL/hr over 60 Minutes  Intravenous  Once 03/31/19 0837 03/31/19 1238   03/30/19 2200  ceFEPIme (MAXIPIME) 2 g in sodium chloride 0.9 % 100 mL IVPB  Status:  Discontinued     2 g 200 mL/hr over 30 Minutes Intravenous Every 24 hours 03/30/19 0809 04/02/19 0914   03/29/19 2354  vancomycin variable dose per unstable renal function (pharmacist dosing)  Status:  Discontinued      Does not apply See admin instructions 03/29/19 2354 04/02/19 0747   03/29/19 2330  vancomycin (VANCOCIN) 1,500 mg in sodium chloride 0.9 % 500 mL IVPB     1,500 mg 250 mL/hr over 120 Minutes Intravenous  Once 03/29/19 2248 03/30/19 0213   03/29/19 2245  vancomycin (VANCOCIN) IVPB 1000 mg/200 mL premix  Status:  Discontinued     1,000 mg 200 mL/hr over 60 Minutes Intravenous  Once 03/29/19 2242 03/29/19 2246   03/29/19 2245  ceFEPIme (MAXIPIME) 2 g in sodium chloride 0.9 % 100 mL IVPB     2 g 200 mL/hr over 30 Minutes Intravenous  Once 03/29/19 2242 03/29/19 2358      Medications   Scheduled Meds: . acetylcysteine  4 mL Nebulization BID  . aspirin EC  81 mg Oral Daily  . atorvastatin  80 mg Oral q1800  . budesonide (PULMICORT) nebulizer solution  0.25 mg Nebulization BID  . Chlorhexidine Gluconate Cloth  6 each Topical Daily  . [START ON 04/03/2019] glimepiride  2 mg Oral Q breakfast  . guaiFENesin  600 mg Oral BID  . heparin  5,000 Units Subcutaneous Q8H  . insulin aspart  0-20 Units Subcutaneous TID WC  . insulin aspart  0-5 Units Subcutaneous QHS  . ipratropium-albuterol  3 mL Nebulization Q6H  . methylPREDNISolone (SOLU-MEDROL) injection  60 mg Intravenous Q12H  . metoprolol tartrate  12.5 mg Oral BID  . multivitamin-lutein  1 capsule Oral Daily  . mupirocin ointment  1 application Nasal BID  . Ensure Max Protein  11 oz Oral BID BM  . sodium bicarbonate  1,300 mg Oral TID   Continuous  Infusions: . sodium chloride 10 mL/hr at 04/02/19 1311  . albumin human 12.5 g (04/02/19 1314)  . meropenem (MERREM) IV Stopped (04/02/19 1228)   . vancomycin     PRN Meds:.sodium chloride, acetaminophen, ondansetron (ZOFRAN) IV   Data Review:   Micro Results Recent Results (from the past 240 hour(s))  Urine Culture     Status: Abnormal (Preliminary result)   Collection Time: 03/29/19 10:16 PM   Specimen: Urine, Clean Catch  Result Value Ref Range Status   Specimen Description   Final    URINE, CLEAN CATCH Performed at [facility], [address] [place], [place] [address]    Special Requests   Final    NONE Performed at [facility], [address] [place], [place] [address]    Culture (A)  Final    50,000 COLONIES/mL KLEBSIELLA PNEUMONIAE Confirmed Extended Spectrum Beta-Lactamase Producer (ESBL).  In bloodstream infections from ESBL organisms, carbapenems are preferred over piperacillin/tazobactam. They are shown to have a lower risk of mortality. 20,000 COLONIES/mL PROVIDENCIA ALCALIFACIENS SUSCEPTIBILITIES TO FOLLOW Performed at [facility] [address], [place], [place] [address]    Report Status PENDING  Incomplete   Organism ID, Bacteria KLEBSIELLA PNEUMONIAE (A)  Final      Susceptibility   Klebsiella pneumoniae - MIC*    AMPICILLIN >=32 RESISTANT Resistant     CEFAZOLIN >=64 RESISTANT Resistant     CEFTRIAXONE >=64 RESISTANT Resistant     CIPROFLOXACIN >=4 RESISTANT Resistant     GENTAMICIN >=16 RESISTANT Resistant     IMIPENEM <=0.25 SENSITIVE Sensitive     NITROFURANTOIN <=16 SENSITIVE Sensitive     TRIMETH/SULFA >=320 RESISTANT Resistant     AMPICILLIN/SULBACTAM >=32 RESISTANT Resistant     PIP/TAZO 32 INTERMEDIATE Intermediate     Extended ESBL POSITIVE Resistant     * 50,000 COLONIES/mL KLEBSIELLA PNEUMONIAE  Novel Coronavirus, NAA (send-out to ref lab)     Status: None   Collection Time: 03/30/19 12:43 AM   Specimen: Nasopharyngeal Swab; Respiratory  Result Value Ref Range Status   SARS-CoV-2, NAA NOT DETECTED NOT DETECTED Final    Comment: (NOTE) This test  was developed and its performance characteristics determined by [employer]. This test has not been FDA cleared or approved. This test has been authorized by FDA under an Emergency Use Authorization (EUA). This test is only authorized for the duration of time the declaration that circumstances exist justifying the authorization of the emergency use of in vitro diagnostic tests for detection of SARS-CoV-2 virus and/or diagnosis of COVID-19 infection under section 564(b)(1) of the Act, 21 U.S.C. 701XBL-3(J)(0), unless the authorization is terminated or revoked sooner. When diagnostic testing is negative, the possibility of a false negative result should be considered in the context of a patient's recent exposures and the presence of clinical signs and symptoms consistent with COVID-19. An individual without symptoms of COVID-19 and who is not shedding SARS-CoV-2 virus would expect to have a negative (not detected) result in this assay. Performed  At: [facility] [address] [place], [place] [id number] Rush Farmer MD [id number]    [place]  Final    Comment: Performed at [facility], [place] [address] [place],  [address]  MRSA PCR Screening     Status: Abnormal   Collection Time: 03/30/19  1:31 AM   Specimen: Nasal Mucosa; Nasopharyngeal  Result Value Ref Range Status   MRSA by PCR POSITIVE (A) NEGATIVE Final    Comment:  The GeneXpert MRSA Assay (FDA approved for NASAL specimens only), is one component of a comprehensive MRSA colonization surveillance program. It is not intended to diagnose MRSA infection nor to guide or monitor treatment for MRSA infections. RESULT CALLED TO, READ BACK BY AND VERIFIED WITH: Berna Bue RN AT [phone] ON 03/30/2019 [facility] Performed at [facility], [address] [place], [place] [address]   CULTURE, BLOOD (ROUTINE X 2) w Reflex to ID Panel     Status: None  (Preliminary result)   Collection Time: 03/31/19  9:33 AM   Specimen: BLOOD  Result Value Ref Range Status   Specimen Description BLOOD BLOOD RIGHT HAND  Final   Special Requests   Final    BOTTLES DRAWN AEROBIC AND ANAEROBIC Blood Culture adequate volume   Culture   Final    NO GROWTH 2 DAYS Performed at [facility], [address] [place], [place] [address]    Report Status PENDING  Incomplete  CULTURE, BLOOD (ROUTINE X 2) w Reflex to ID Panel     Status: None (Preliminary result)   Collection Time: 03/31/19  9:47 AM   Specimen: BLOOD  Result Value Ref Range Status   Specimen Description BLOOD RIGHT ANTECUBITAL  Final   Special Requests   Final    BOTTLES DRAWN AEROBIC AND ANAEROBIC Blood Culture results may not be optimal due to an excessive volume of blood received in culture bottles   Culture   Final    NO GROWTH 2 DAYS Performed at [facility], [address], [place], [place] [address]    Report Status PENDING  Incomplete    Radiology Reports Ct Chest Wo Contrast  Result Date: 04/01/2019 CLINICAL DATA:  78 year old male pneumonia, advanced COPD, hypoxemic respiratory failure. Clinical history also includes bladder cancer. EXAM: CT CHEST WITHOUT CONTRAST TECHNIQUE: Multidetector CT imaging of the chest was performed following the standard protocol without IV contrast. COMPARISON:  Prior CT scan of the chest, abdomen and pelvis 10/22/2007 FINDINGS: Cardiovascular: Limited evaluation in the absence of intravenous contrast. Atherosclerotic calcifications are present throughout the aorta. Patient is status post median sternotomy with evidence of prior multivessel CABG. The heart is at the upper limits of normal for size. No pericardial effusion. Extensive native coronary artery disease. Mediastinum/Nodes: Unremarkable CT appearance of the thyroid gland. No suspicious mediastinal or hilar adenopathy. No soft tissue mediastinal mass. The thoracic esophagus is  unremarkable. Lungs/Pleura: Moderately severe combined centrilobular and paraseptal pulmonary emphysema. Architectural distortion and linear pleuroparenchymal scarring present in the posterior right upper lobe suggesting sequelae of a prior infarct or radiation treatment. Mild chronic atelectasis versus scarring in the lung bases. Diffuse mild bronchial wall thickening. Small left and trace right pleural effusions. Upper Abdomen: Incompletely imaged kidneys demonstrate bilateral hydronephrosis. Otherwise, no acute abnormality within the upper abdomen. Musculoskeletal: No acute fracture or aggressive appearing lytic or blastic osseous lesion. Stable L1 compression fracture with approximately 50% height loss. Healed median sternotomy. IMPRESSION: 1. No acute cardiopulmonary process. 2. Incompletely imaged bilateral hydronephrosis. Recommend clinical correlation with serum creatinine. 3. Moderately severe combined centrilobular and paraseptal pulmonary emphysema. 4. Diffuse mild bronchial wall thickening suggests chronic bronchitis. 5. Areas of scarring and architectural distortion in the posterior right upper lobe and to a lesser extent in the bilateral lower lobes. 6. Small left and trace right pleural effusions. 7. Stable chronic L1 compression fracture. Aortic Atherosclerosis (ICD10-I70.0) and Emphysema (ICD10-J43.9). Electronically Signed   By: Jacqulynn Cadet M.D.   On: 04/01/2019 19:26  US Renal  Result Date: 04/02/2019 CLINICAL DATA:  Renal failure, type II diabetes mellitus, hypertension, history bladder cancer EXAM: RENAL / URINARY TRACT ULTRASOUND COMPLETE COMPARISON:  CT abdomen and pelvis 03/25/2012 FINDINGS: Right Kidney: Renal measurements: 10.6 x 5.4 x 5.3 cm = volume: 157 mL. Cortical thinning and increased cortical echogenicity. Lobulated renal contours. Multiple renal cysts identified, most notably 2.4 x 2.5 x 2.7 cm diameter simple cyst and 2.1 x 2.0 x 2.4 cm diameter simple cyst at the upper  pole of the RIGHT kidney. Significant hydronephrosis. No solid renal mass or shadowing calcification. Left Kidney: Renal measurements: 10.8 x 5.0 x 5.8 cm = volume: 164 mL. Cortical thinning and minimally increased cortical echogenicity. Significant hydronephrosis. Small cyst at inferior pole 11 x 11 x 12 mm. No solid mass or shadowing calcification. Bladder: Surgically absent, patient has urostomy IMPRESSION: Significant BILATERAL hydronephrosis, which could be due to obstruction or reflux in a patient with prior bladder resection and urostomy. BILATERAL renal cysts. Electronically Signed   By: Lavonia Dana M.D.   On: 04/02/2019 10:35   Dg Chest Port 1 View  Result Date: 04/01/2019 CLINICAL DATA:  Pulmonary disease. EXAM: PORTABLE CHEST 1 VIEW COMPARISON:  03/29/2019 and 02/15/2019 and October 29, 2017 FINDINGS: The heart size and pulmonary vascularity are normal. CABG. The accentuation of the interstitial markings has returned to baseline since the prior study. The patient has bilateral chronic interstitial disease with no acute abnormalities. No effusions. No significant bone abnormality. IMPRESSION: No acute cardiopulmonary disease. Chronic interstitial lung disease. Electronically Signed   By: Lorriane Shire M.D.   On: 04/01/2019 09:18   Dg Chest Portable 1 View  Result Date: 03/29/2019 CLINICAL DATA:  Weakness EXAM: PORTABLE CHEST 1 VIEW COMPARISON:  02/15/2019, 10/30/2017, 08/09/2016 FINDINGS: Post sternotomy changes. Cardiomegaly. Coarse bilateral interstitial opacity, suspect for chronic interstitial change. Streaky superimposed opacity in the left mid and lower lung. Aortic atherosclerosis. No pneumothorax. IMPRESSION: 1. Cardiomegaly. 2. Streaky left mid and lower lung opacities, suspect for acute infectious or inflammatory process superimposed on underlying chronic disease. Electronically Signed   By: Donavan Foil M.D.   On: 03/29/2019 21:16     CBC Recent Labs  Lab 03/29/19 2013  03/30/19 0359 03/31/19 0406 04/01/19 0357  WBC 9.9 10.5 5.8 4.9  HGB 9.5* 10.1* 8.3* 7.8*  HCT 30.0* 31.1* 26.0* 25.1*  PLT 199 240 162 158  MCV 85.7 83.6 84.4 85.7  MCH 27.1 27.2 26.9 26.6  MCHC 31.7 32.5 31.9 31.1  RDW 15.4 15.2 15.3 14.9  LYMPHSABS 0.1*  --   --   --   MONOABS 0.6  --   --   --   EOSABS 0.0  --   --   --   BASOSABS 0.0  --   --   --     Chemistries  Recent Labs  Lab 03/29/19 2013 03/30/19 0359 03/31/19 0406 04/01/19 0357 04/02/19 0352  NA 123* 128* 130* 129* 131*  K 3.8 3.0* 2.8* 3.3* 3.8  CL 88* 97* 100 100 102  CO2 20* 19* 19* 19* 19*  GLUCOSE 155* 93 73 226* 259*  BUN 90* 78* 64* 67* 81*  CREATININE 4.03* 3.57* 2.92* 2.76* 2.60*  CALCIUM 7.5* 7.1* 7.0* 7.5* 7.7*  MG  --  1.7 1.5* 2.6*  --   AST  --  26  --   --   --   ALT  --  14  --   --   --   ALKPHOS  --  55  --   --   --   BILITOT  --  1.0  --   --   --    ------------------------------------------------------------------------------------------------------------------ estimated creatinine clearance is 22.2 mL/min (A) (by C-G formula based on SCr of 2.6 mg/dL (H)). ------------------------------------------------------------------------------------------------------------------ No results for input(s): HGBA1C in the last 72 hours. ------------------------------------------------------------------------------------------------------------------ No results for input(s): CHOL, HDL, LDLCALC, TRIG, CHOLHDL, LDLDIRECT in the last 72 hours. ------------------------------------------------------------------------------------------------------------------ No results for input(s): TSH, T4TOTAL, T3FREE, THYROIDAB in the last 72 hours.  Invalid input(s): FREET3 ------------------------------------------------------------------------------------------------------------------ No results for input(s): VITAMINB12, FOLATE, FERRITIN, TIBC, IRON, RETICCTPCT in the last 72 hours.  Coagulation  profile Recent Labs  Lab 03/30/19 1128  INR 1.3*    No results for input(s): DDIMER in the last 72 hours.  Cardiac Enzymes No results for input(s): CKMB, TROPONINI, MYOGLOBIN in the last 168 hours.  Invalid input(s): CK ------------------------------------------------------------------------------------------------------------------ Invalid input(s): [place]  Patient 78 year old admitted with sepsis  Severe sepsis with septic shock ([place]) - CT scan shows bronchitis and severe COPD change   Acute on chronic renal failure ([facility]) -due to sepsis, dehydration.  Renal functions improved  UTI (urinary tract infection) -IV antibiotics, showed Klebsiella  Hyponatremia -follow sodium [place]buto  Diabetes type II with renal complications continue sliding scale for now, discussed with the patient regarding close monitoring his blood sugar follow-up with primary care provider and adhering to a carbohydrate consistent diet  HTN (hypertension) -hold antihypertensives as the patient is currently hypotensive on pressors  Acute on chronic COPD (chronic obstructive pulmonary disease) ([place]) -exacerbation Treat with nebs Pulmicort and steroids  HLD (hyperlipidemia) -continue home dose antilipid     Code Status Orders  (From admission, onward)         Start     Ordered   03/30/19 0124  Full code  Continuous     03/30/19 0124        Code Status History    Date Active Date Inactive Code Status Order ID Comments User Context   02/14/2019 2330 02/16/2019 1909 Full Code [id number]  Henreitta Leber, MD Inpatient   10/30/2017 0203 11/01/2017 2259 Full Code [id number]  Lance Coon, MD Inpatient   Advance Care Planning Activity           Consults  intesivist  DVT Prophylaxis  heparin  Lab Results  Component Value Date   PLT 158 04/01/2019     Time Spent in minutes  22min Greater than 50% of time spent in care coordination and counseling patient regarding the  condition and plan of care.   Dustin Flock M.D on 04/02/2019 at 1:28 PM  Between 7am to 6pm - Pager - [phone]  After 6pm go to [email or web address] - Proofreader  [employer]   Office  [phone]

## 2019-04-02 NOTE — Progress Notes (Signed)
[facility], [place] 04/02/19  Subjective:   08/26 0701 - 08/27 0700 In: -  Out: 400 [Urine:400] Doing fair States he is eating well UOP is good S Creatinine further improved   Objective:  Vital signs in last 24 hours:  Temp:  [97.3 F (36.3 C)-97.6 F (36.4 C)] 97.6 F (36.4 C) (08/27 0441) Pulse Rate:  [77-110] 77 (08/27 0441) Resp:  [16-18] 16 (08/27 0441) BP: (108-122)/(63-73) 116/65 (08/27 0441) SpO2:  [94 %-100 %] 96 % (08/27 0441)  Weight change:  Filed Weights   03/30/19 0126 03/31/19 0350 04/01/19 0452  Weight: 70.8 kg 71.5 kg 71.1 kg    Intake/Output:    Intake/Output Summary (Last 24 hours) at 04/02/2019 1008 Last data filed at 04/02/2019 2330 Gross per 24 hour  Intake 284.2 ml  Output 400 ml  Net -115.8 ml     Physical Exam:  Gen:                 Alert, cooperative, no distress, appears stated age Head:               Normocephalic, without obvious abnormality, atraumatic Eyes/ENT:       conjunctiva/corneas clear,  moist oral mucus membranes Neck:               Supple,  thyroid: not enlarged, no JVD Lungs:             Clear to auscultation bilaterally, respirations unlabored Heart:               Regular rhythm  With ectopic beats, no  rub or gallop Abdomen:        Soft, non-tender, urostomy bag in place Extremities:     no cyanosis, trace edema Skin:                Skin color, texture, turgor normal,scattered ecchymosis  Neurologic:      Alert and oriented, able to answer questions appropriately   Basic Metabolic Panel:  Recent Labs  Lab 03/29/19 2013 03/30/19 0359 03/31/19 0406 04/01/19 0357 04/02/19 0352  NA 123* 128* 130* 129* 131*  K 3.8 3.0* 2.8* 3.3* 3.8  CL 88* 97* 100 100 102  CO2 20* 19* 19* 19* 19*  GLUCOSE 155* 93 73 226* 259*  BUN 90* 78* 64* 67* 81*  CREATININE 4.03* 3.57* 2.92* 2.76* 2.60*  CALCIUM 7.5* 7.1* 7.0* 7.5* 7.7*  MG  --  1.7 1.5* 2.6*  --   PHOS  --  5.2* 3.0  --   --       CBC: Recent Labs  Lab 03/29/19 2013 03/30/19 0359 03/31/19 0406 04/01/19 0357  WBC 9.9 10.5 5.8 4.9  NEUTROABS 9.3*  --   --   --   HGB 9.5* 10.1* 8.3* 7.8*  HCT 30.0* 31.1* 26.0* 25.1*  MCV 85.7 83.6 84.4 85.7  PLT 199 240 162 158     No results found for: HEPBSAG, HEPBSAB, HEPBIGM    Microbiology:  Recent Results (from the past 240 hour(s))  Urine Culture     Status: Abnormal (Preliminary result)   Collection Time: 03/29/19 10:16 PM   Specimen: Urine, Clean Catch  Result Value Ref Range Status   Specimen Description   Final    URINE, CLEAN CATCH Performed at [facility], [address] [place], [place] [address]    Special Requests   Final    NONE Performed at [facility], [address] [place],  [place] [address]    Culture (A)  Final    50,000 COLONIES/mL KLEBSIELLA PNEUMONIAE Confirmed Extended Spectrum Beta-Lactamase Producer (ESBL).  In bloodstream infections from ESBL organisms, carbapenems are preferred over piperacillin/tazobactam. They are shown to have a lower risk of mortality. 20,000 COLONIES/mL PROVIDENCIA ALCALIFACIENS SUSCEPTIBILITIES TO FOLLOW Performed at [facility] [address], [place], [place] [address]    Report Status PENDING  Incomplete   Organism ID, Bacteria KLEBSIELLA PNEUMONIAE (A)  Final      Susceptibility   Klebsiella pneumoniae - MIC*    AMPICILLIN >=32 RESISTANT Resistant     CEFAZOLIN >=64 RESISTANT Resistant     CEFTRIAXONE >=64 RESISTANT Resistant     CIPROFLOXACIN >=4 RESISTANT Resistant     GENTAMICIN >=16 RESISTANT Resistant     IMIPENEM <=0.25 SENSITIVE Sensitive     NITROFURANTOIN <=16 SENSITIVE Sensitive     TRIMETH/SULFA >=320 RESISTANT Resistant     AMPICILLIN/SULBACTAM >=32 RESISTANT Resistant     PIP/TAZO 32 INTERMEDIATE Intermediate     Extended ESBL POSITIVE Resistant     * 50,000 COLONIES/mL KLEBSIELLA PNEUMONIAE  Novel Coronavirus, NAA (send-out to ref lab)      Status: None   Collection Time: 03/30/19 12:43 AM   Specimen: Nasopharyngeal Swab; Respiratory  Result Value Ref Range Status   SARS-CoV-2, NAA NOT DETECTED NOT DETECTED Final    Comment: (NOTE) This test was developed and its performance characteristics determined by [employer]. This test has not been FDA cleared or approved. This test has been authorized by FDA under an Emergency Use Authorization (EUA). This test is only authorized for the duration of time the declaration that circumstances exist justifying the authorization of the emergency use of in vitro diagnostic tests for detection of SARS-CoV-2 virus and/or diagnosis of COVID-19 infection under section 564(b)(1) of the Act, 21 U.S.C. 335KTG-2(B)(6), unless the authorization is terminated or revoked sooner. When diagnostic testing is negative, the possibility of a false negative result should be considered in the context of a patient's recent exposures and the presence of clinical signs and symptoms consistent with COVID-19. An individual without symptoms of COVID-19 and who is not shedding SARS-CoV-2 virus would expect to have a negative (not detected) result in this assay. Performed  At: [facility] [address] [place], [place] [id number] Rush Farmer MD JG:[id number]    [place]  Final    Comment: Performed at [facility], [address] [place], [place] [address]  MRSA PCR Screening     Status: Abnormal   Collection Time: 03/30/19  1:31 AM   Specimen: Nasal Mucosa; Nasopharyngeal  Result Value Ref Range Status   MRSA by PCR POSITIVE (A) NEGATIVE Final    Comment:        The GeneXpert MRSA Assay (FDA approved for NASAL specimens only), is one component of a comprehensive MRSA colonization surveillance program. It is not intended to diagnose MRSA infection nor to guide or monitor treatment for MRSA infections. RESULT CALLED TO, READ BACK BY AND VERIFIED  WITH: Berna Bue RN AT [phone] ON 03/30/2019 [facility] Performed at [facility], [address] [place], [place] [address]   CULTURE, BLOOD (ROUTINE X 2) w Reflex to ID Panel     Status: None (Preliminary result)   Collection Time: 03/31/19  9:33 AM   Specimen: BLOOD  Result Value Ref Range Status   Specimen Description BLOOD BLOOD RIGHT HAND  Final   Special Requests   Final  BOTTLES DRAWN AEROBIC AND ANAEROBIC Blood Culture adequate volume   Culture   Final    NO GROWTH 2 DAYS Performed at [facility], [address] [place], [place] [address]    Report Status PENDING  Incomplete  CULTURE, BLOOD (ROUTINE X 2) w Reflex to ID Panel     Status: None (Preliminary result)   Collection Time: 03/31/19  9:47 AM   Specimen: BLOOD  Result Value Ref Range Status   Specimen Description BLOOD RIGHT ANTECUBITAL  Final   Special Requests   Final    BOTTLES DRAWN AEROBIC AND ANAEROBIC Blood Culture results may not be optimal due to an excessive volume of blood received in culture bottles   Culture   Final    NO GROWTH 2 DAYS Performed at [facility], [address] [place], [place] [address]    Report Status PENDING  Incomplete    Coagulation Studies: Recent Labs    03/30/19 1128  LABPROT 15.6*  INR 1.3*    Urinalysis: No results for input(s): COLORURINE, LABSPEC, PHURINE, GLUCOSEU, HGBUR, BILIRUBINUR, KETONESUR, PROTEINUR, UROBILINOGEN, NITRITE, LEUKOCYTESUR in the last 72 hours.  Invalid input(s): APPERANCEUR    Imaging: Ct Chest Wo Contrast  Result Date: 04/01/2019 CLINICAL DATA:  78 year old male pneumonia, advanced COPD, hypoxemic respiratory failure. Clinical history also includes bladder cancer. EXAM: CT CHEST WITHOUT CONTRAST TECHNIQUE: Multidetector CT imaging of the chest was performed following the standard protocol without IV contrast. COMPARISON:  Prior CT scan of the chest, abdomen and pelvis 10/22/2007 FINDINGS: Cardiovascular: Limited  evaluation in the absence of intravenous contrast. Atherosclerotic calcifications are present throughout the aorta. Patient is status post median sternotomy with evidence of prior multivessel CABG. The heart is at the upper limits of normal for size. No pericardial effusion. Extensive native coronary artery disease. Mediastinum/Nodes: Unremarkable CT appearance of the thyroid gland. No suspicious mediastinal or hilar adenopathy. No soft tissue mediastinal mass. The thoracic esophagus is unremarkable. Lungs/Pleura: Moderately severe combined centrilobular and paraseptal pulmonary emphysema. Architectural distortion and linear pleuroparenchymal scarring present in the posterior right upper lobe suggesting sequelae of a prior infarct or radiation treatment. Mild chronic atelectasis versus scarring in the lung bases. Diffuse mild bronchial wall thickening. Small left and trace right pleural effusions. Upper Abdomen: Incompletely imaged kidneys demonstrate bilateral hydronephrosis. Otherwise, no acute abnormality within the upper abdomen. Musculoskeletal: No acute fracture or aggressive appearing lytic or blastic osseous lesion. Stable L1 compression fracture with approximately 50% height loss. Healed median sternotomy. IMPRESSION: 1. No acute cardiopulmonary process. 2. Incompletely imaged bilateral hydronephrosis. Recommend clinical correlation with serum creatinine. 3. Moderately severe combined centrilobular and paraseptal pulmonary emphysema. 4. Diffuse mild bronchial wall thickening suggests chronic bronchitis. 5. Areas of scarring and architectural distortion in the posterior right upper lobe and to a lesser extent in the bilateral lower lobes. 6. Small left and trace right pleural effusions. 7. Stable chronic L1 compression fracture. Aortic Atherosclerosis (ICD10-I70.0) and Emphysema (ICD10-J43.9). Electronically Signed   By: Jacqulynn Cadet M.D.   On: 04/01/2019 19:26   Dg Chest Port 1 View  Result Date:  04/01/2019 CLINICAL DATA:  Pulmonary disease. EXAM: PORTABLE CHEST 1 VIEW COMPARISON:  03/29/2019 and 02/15/2019 and October 29, 2017 FINDINGS: The heart size and pulmonary vascularity are normal. CABG. The accentuation of the interstitial markings has returned to baseline since the prior study. The patient has bilateral chronic interstitial disease with no acute abnormalities. No effusions. No significant bone abnormality. IMPRESSION: No acute cardiopulmonary disease. Chronic interstitial lung disease.  Electronically Signed   By: Lorriane Shire M.D.   On: 04/01/2019 09:18     Medications:   . sodium chloride    . albumin human Stopped (04/01/19 2309)  . meropenem (MERREM) IV    . vancomycin     . acetylcysteine  4 mL Nebulization BID  . aspirin EC  81 mg Oral Daily  . atorvastatin  80 mg Oral q1800  . budesonide (PULMICORT) nebulizer solution  0.25 mg Nebulization BID  . Chlorhexidine Gluconate Cloth  6 each Topical Daily  . guaiFENesin  600 mg Oral BID  . heparin  5,000 Units Subcutaneous Q8H  . insulin aspart  0-20 Units Subcutaneous TID WC  . insulin aspart  0-5 Units Subcutaneous QHS  . ipratropium-albuterol  3 mL Nebulization Q6H  . methylPREDNISolone (SOLU-MEDROL) injection  60 mg Intravenous Q12H  . metoprolol tartrate  12.5 mg Oral BID  . multivitamin-lutein  1 capsule Oral Daily  . mupirocin ointment  1 application Nasal BID  . Ensure Max Protein  11 oz Oral BID BM  . sodium bicarbonate  1,300 mg Oral TID   acetaminophen, ondansetron (ZOFRAN) IV  Assessment/ Plan:  78 y.o.caucasian male with medical problems of T2DM, CKD st 4, CAD/CABG at [facility],COPD, h/o bladder cancer Dx 2008 s/p chemo, radiation with ileal conduit and urostomy, was admitted on 03/29/2019 with hypoglycemia and fatigue. [facility] Nephrology  1. AKI on CKD st 4 AKI is likely secondary to hemodynamic changes.  Creatinine on admission of 4.03   Outpatient baseline creatinine of 2.9-3.1.   Continue supportive care.    May have established a new baseline of 2.60  2. Hypokalemia - replace potassium as needed -Level is normal  3.  Klebsiella UTI. Antibiotics as per primary team     LOS: [place] 8/27/202010:08 [place], [place]  Note: This note was prepared with Dragon dictation. Any transcription errors are unintentional

## 2019-04-02 NOTE — Care Management Important Message (Signed)
Important Message  Patient Details  Name: Bryan Brewer MRN: [id number] Date of Birth: Oct 26, 1940   Medicare Important Message Given:  Yes     Juliann Pulse A Caston Coopersmith 04/02/2019, 12:05 PM

## 2019-04-02 NOTE — Progress Notes (Addendum)
Pharmacy Antibiotic Note  Bryan Brewer is a 78 y.o. male admitted on 03/29/2019 with sepsis (with tachycardia and bandemia). He was found to have pneumonia on imaging and likely UTI, based on urinalysis. Patient became hypotensive was admitted to the ICU requiring pressors. History significant for bladder cancer, COPD, hypertension, and kidney disease. Patient is noted to have acute on chronic renal failure. Serum creatinine has improved slightly but still very high. Patient is afebrile and WBC is stable. Pharmacy has been consulted for vancomycin and cefepime dosing.  Patient initially in AKI now back to baseline renal function x 2 days with Scr 2.6 and CrCl 22.41ml/min  Plan: Change Cefepime to Meropenem 500mg  q12h   (ESBL in Urine - discussed with Dustin Flock)  Vancomycin: Patient given initial load of 1500mg  in the ED 08/24 @ 0009  Random level assessed 08/24 @ 1637 of 19 ug/ml  1st Follow up dose given 08/25 @ 1138 of 1000mg   Checked a second random vanc level to ensure clearance 8/27 @ 0828 - returned 20 ug/ml  Will start this evening @ 2200 Vancomycin 1250 mg IV Q 48 hrs. Goal AUC 400-550. Expected AUC: 534 SCr used: 2.6   Height: 5\' 7"  (170.2 cm) Weight: 156 lb 12 oz (71.1 kg) IBW/kg (Calculated) : 66.1  Temp (24hrs), Avg:97.6 F (36.4 C), Min:97.3 F (36.3 C), Max:98.1 F (36.7 C)  Recent Labs  Lab 03/29/19 2013 03/30/19 0359 03/30/19 0541 03/30/19 1637 03/31/19 0406 04/01/19 0357 04/02/19 0352  WBC 9.9 10.5  --   --  5.8 4.9  --   CREATININE 4.03* 3.57*  --   --  2.92* 2.76* 2.60*  LATICACIDVEN 1.9  --  1.2  --   --   --   --   VANCORANDOM  --   --   --  19  --   --   --     Estimated Creatinine Clearance: 22.2 mL/min (A) (by C-G formula based on SCr of 2.6 mg/dL (H)).    No Known Allergies  Antimicrobials this admission: Cefepime 08/23 >>  Vancomycin 08/24 >>   Dose adjustments this admission: n/a  Microbiology results: 08/24 MRSA PCR:  positive 08/25 Urine Culture; Pending   Thank you for allowing pharmacy to be a part of this patient's care.  Lu Duffel, PharmD, BCPS Clinical Pharmacist 04/02/2019 9:01 AM

## 2019-04-02 NOTE — TOC Initial Note (Signed)
Transition of Care [facility]) - Initial/Assessment Note    Patient Details  Name: Bryan Brewer MRN: [id number] Date of Birth: 10-08-1940  Transition of Care [facility]) CM/SW Contact:    Shelbie Hutching, RN Phone Number: 04/02/2019, 9:57 AM  Clinical Narrative:                 Patient admitted with severe sepsis.  Patient is feeling better probable discharge home tomorrow.   Patient is from home with his wife, he has all needed DME.  Patient refuses home health services stating his "wife takes very good care of me."  No other discharge needs noted at this time.   Expected Discharge Plan: Home/Self Care Barriers to Discharge: Continued Medical Work up   Patient Goals and CMS Choice Patient states their goals for this hospitalization and ongoing recovery are:: Return home      Expected Discharge Plan and Services Expected Discharge Plan: Home/Self Care       Living arrangements for the past 2 months: Single Family Home                                      Prior Living Arrangements/Services Living arrangements for the past 2 months: Single Family Home Lives with:: Spouse Patient language and need for interpreter reviewed:: No Do you feel safe going back to the place where you live?: Yes      Need for Family Participation in Patient Care: Yes (Comment) Care giver support system in place?: Yes (comment)(wife) Current home services: DME(walker cane) Criminal Activity/Legal Involvement Pertinent to Current Situation/Hospitalization: No - Comment as needed  Activities of Daily Living Home Assistive Devices/Equipment: None ADL Screening (condition at time of admission) Patient's cognitive ability adequate to safely complete daily activities?: Yes Is the patient deaf or have difficulty hearing?: No Does the patient have difficulty seeing, even when wearing glasses/contacts?: No Does the patient have difficulty concentrating, remembering, or making decisions?: No Patient able to  express need for assistance with ADLs?: Yes Does the patient have difficulty dressing or bathing?: No Independently performs ADLs?: Yes (appropriate for developmental age) Does the patient have difficulty walking or climbing stairs?: No Weakness of Legs: Both Weakness of Arms/Hands: None  Permission Sought/Granted Permission sought to share information with : Case Manager Permission granted to share information with : Yes, Verbal Permission Granted              Emotional Assessment Appearance:: Appears stated age Attitude/Demeanor/Rapport: Engaged Affect (typically observed): Accepting Orientation: : Oriented to Self, Oriented to Place, Oriented to  Time, Oriented to Situation Alcohol / Substance Use: Not Applicable Psych Involvement: No (comment)  Admission diagnosis:  Healthcare-associated pneumonia [J18.9] Hypotension, unspecified hypotension type [I95.9] Sepsis, due to unspecified organism, unspecified whether acute organ dysfunction present [facility]) [A41.9] Patient Active Problem List   Diagnosis Date Noted  . Pressure injury of skin 03/31/2019  . HCAP (healthcare-associated pneumonia) 03/29/2019  . UTI (urinary tract infection) 03/29/2019  . Severe sepsis with septic shock ([place]) 03/29/2019  . Hyponatremia 03/29/2019  . Acute on chronic renal failure ([place]) 02/14/2019  . NSTEMI (non-ST elevated myocardial infarction) ([employer]) 10/30/2017  . Diabetes ([place]) 10/30/2017  . CKD (chronic kidney disease), stage IV ([place]) 10/30/2017  . HTN (hypertension) 10/30/2017  . HLD (hyperlipidemia) 10/30/2017  . COPD (chronic obstructive pulmonary disease) ([place]) 10/30/2017   PCP:  Glendon Axe, MD Pharmacy:   [id number] [address],  [place] [address] Phone: [phone] Fax: [phone]  [place], [place] [address] Phone: [phone] Fax: [phone]     Social  Determinants of Health (SDOH) Interventions    Readmission Risk Interventions No flowsheet data found.

## 2019-04-02 NOTE — Progress Notes (Addendum)
Inpatient Diabetes Program Recommendations  AACE/ADA: New Consensus Statement on Inpatient Glycemic Control   Target Ranges:  Prepandial:   less than 140 mg/dL      Peak postprandial:   less than 180 mg/dL (1-2 hours)      Critically ill patients:  140 - 180 mg/dL   Results for SHAMARION, COOTS (MRN [id number]) as of 04/02/2019 10:01  Ref. Range 04/01/2019 07:11 04/01/2019 11:50 04/01/2019 17:09 04/01/2019 17:23 04/01/2019 20:48 04/01/2019 21:47 04/02/2019 07:44  Glucose-Capillary Latest Ref Range: 70 - 99 mg/dL 163 (H) 344 (H) 471 (H) 485 (H) 409 (H) 372 (H) 223 (H)   Review of Glycemic Control  Diabetes history: DM2 Outpatient Diabetes medications: Amaryl 2 mg daily Current orders for Inpatient glycemic control: Novolog 0-20 units TID with meals, Novolog 0-5 units QHS; Solumedrol 60 mg Q12H  Inpatient Diabetes Program Recommendations:   Insulin-Basal: If steroids are continued, please consider ordering Lantus 7 units Q24H. If Lantus is ordered, it will need to be adjusted as steroids are tapered off.  Insulin-Meal Coverage: If steroids are continued, please consider ordering Novolog 4 units TID with meals for meal coverage if patient eats at least 50% of meals.  Thanks, Barnie Alderman, RN, MSN, CDE Diabetes Coordinator Inpatient Diabetes Program [phone] (Team Pager from 8am to 5pm)

## 2019-04-02 NOTE — Progress Notes (Signed)
PHARMACY CONSULT NOTE - FOLLOW UP  Pharmacy Consult for Electrolyte Monitoring and Replacement   Recent Labs: Potassium (mmol/L)  Date Value  04/02/2019 3.8  04/21/2012 4.8   Magnesium (mg/dL)  Date Value  04/01/2019 2.6 (H)   Calcium (mg/dL)  Date Value  04/02/2019 7.7 (L)   Calcium, Total (mg/dL)  Date Value  04/21/2012 9.3   Albumin (g/dL)  Date Value  03/30/2019 2.1 (L)  04/21/2012 3.4   Phosphorus (mg/dL)  Date Value  03/31/2019 3.0   Sodium (mmol/L)  Date Value  04/02/2019 131 (L)  04/21/2012 139     Assessment: 78 year old male admitted with concern for sepsis. Initially required vasopressors which have now been titrated off. Pharmacy consulted for electrolyte management.  Scr continuing to improve  4.03>3.57>2.92>2.76>2.6  Goal of Therapy:  Electrolytes WNL (K ~ 4, Mg ~ 2, phos WNL)  Plan:  K 3.8 - Sodium bicarb tablets likely contributing to hypokalemia.  No replenishment warranted at this time.  Last Mg 2.6 - no supplementation at this time.  Na 131 - patient is not currently on any fluids. Na remains stable around 130. Receiving sodium bicarb 1300 mg TID. Will continue to monitor.  BMP with morning labs.  Lu Duffel, PharmD, BCPS Clinical Pharmacist 04/02/2019 7:28 AM

## 2019-04-03 ENCOUNTER — Other Ambulatory Visit: Payer: Self-pay

## 2019-04-03 DIAGNOSIS — N390 Urinary tract infection, site not specified: Secondary | ICD-10-CM

## 2019-04-03 DIAGNOSIS — N1330 Unspecified hydronephrosis: Secondary | ICD-10-CM

## 2019-04-03 LAB — BASIC METABOLIC PANEL
Anion gap: 8 (ref 5–15)
BUN: 80 mg/dL — ABNORMAL HIGH (ref 8–23)
CO2: 20 mmol/L — ABNORMAL LOW (ref 22–32)
Calcium: 7.6 mg/dL — ABNORMAL LOW (ref 8.9–10.3)
Chloride: 105 mmol/L (ref 98–111)
Creatinine, Ser: 2.45 mg/dL — ABNORMAL HIGH (ref 0.61–1.24)
GFR calc Af Amer: 28 mL/min — ABNORMAL LOW (ref >60)
GFR calc non Af Amer: 24 mL/min — ABNORMAL LOW (ref >60)
Glucose, Bld: 315 mg/dL — ABNORMAL HIGH (ref 70–99)
Potassium: 3.8 mmol/L (ref 3.5–5.1)
Sodium: 133 mmol/L — ABNORMAL LOW (ref 135–145)

## 2019-04-03 LAB — VITAMIN B12: Vitamin B-12: 228 pg/mL (ref 180–914)

## 2019-04-03 LAB — GLUCOSE, CAPILLARY
Glucose-Capillary: 304 mg/dL — ABNORMAL HIGH (ref 70–99)
Glucose-Capillary: 304 mg/dL — ABNORMAL HIGH (ref 70–99)
Glucose-Capillary: 326 mg/dL — ABNORMAL HIGH (ref 70–99)
Glucose-Capillary: 321 mg/dL — ABNORMAL HIGH (ref 70–99)

## 2019-04-03 LAB — URINE CULTURE: Culture: 50000 — AB

## 2019-04-03 LAB — CBC
HCT: 24.1 % — ABNORMAL LOW (ref 39.0–52.0)
Hemoglobin: 7.6 g/dL — ABNORMAL LOW (ref 13.0–17.0)
MCH: 27 pg (ref 26.0–34.0)
MCHC: 31.5 g/dL (ref 30.0–36.0)
MCV: 85.8 fL (ref 80.0–100.0)
Platelets: 172 10*3/uL (ref 150–400)
RBC: 2.81 MIL/uL — ABNORMAL LOW (ref 4.22–5.81)
RDW: 15.3 % (ref 11.5–15.5)
WBC: 6.8 10*3/uL (ref 4.0–10.5)
nRBC: 0 % (ref 0.0–0.2)

## 2019-04-03 LAB — IRON AND TIBC
Iron: 48 ug/dL (ref 45–182)
Saturation Ratios: 39 % (ref 17.9–39.5)
TIBC: 125 ug/dL — ABNORMAL LOW (ref 250–450)
UIBC: 77 ug/dL

## 2019-04-03 LAB — FOLATE: Folate: 6.2 ng/mL (ref >5.9)

## 2019-04-03 LAB — RETICULOCYTES
Immature Retic Fract: 14.4 % (ref 2.3–15.9)
RBC.: 2.79 MIL/uL — ABNORMAL LOW (ref 4.22–5.81)
Retic Count, Absolute: 54.1 10*3/uL (ref 19.0–186.0)
Retic Ct Pct: 1.9 % (ref 0.4–3.1)

## 2019-04-03 LAB — PREPARE RBC (CROSSMATCH)

## 2019-04-03 LAB — FERRITIN: Ferritin: 368 ng/mL — ABNORMAL HIGH (ref 24–336)

## 2019-04-03 MED ORDER — PNEUMOCOCCAL VAC POLYVALENT 25 MCG/0.5ML IJ INJ: 0.5000 mL | INJECTION | INTRAMUSCULAR | Status: DC

## 2019-04-03 MED ORDER — INSULIN ASPART 100 UNIT/ML ~~LOC~~ SOLN
4.0000 [IU] | Freq: Three times a day (TID) | SUBCUTANEOUS | Status: DC
  Administered 2019-04-03 – 2019-04-04 (×4): 4 [IU] via SUBCUTANEOUS
  Filled 2019-04-03 (×4): qty 1

## 2019-04-03 MED ORDER — SODIUM CHLORIDE 0.9% IV SOLUTION
Freq: Once | INTRAVENOUS | Status: AC
  Administered 2019-04-03: 12:00:00 via INTRAVENOUS

## 2019-04-03 MED ORDER — IPRATROPIUM-ALBUTEROL 0.5-2.5 (3) MG/3ML IN SOLN
3.0000 mL | Freq: Three times a day (TID) | RESPIRATORY_TRACT | Status: DC
  Administered 2019-04-04 (×2): 3 mL via RESPIRATORY_TRACT
  Filled 2019-04-03 (×2): qty 3

## 2019-04-03 MED ORDER — INSULIN GLARGINE 100 UNIT/ML ~~LOC~~ SOLN
7.0000 [IU] | Freq: Every day | SUBCUTANEOUS | Status: DC
  Administered 2019-04-03 – 2019-04-04 (×2): 7 [IU] via SUBCUTANEOUS
  Filled 2019-04-03 (×2): qty 0.07

## 2019-04-03 NOTE — Progress Notes (Signed)
[facility], [place] 04/03/19  Subjective:   08/27 0701 - 08/28 0700 In: 786 [I.V.:19.1; IV Piggyback:767] Out: 250 [Urine:250] Doing fair States he is eating well S Creatinine further improved  To 2.45 today  Objective:  Vital signs in last 24 hours:  Temp:  [97.4 F (36.3 C)-98.6 F (37 C)] 98.4 F (36.9 C) (08/28 0835) Pulse Rate:  [86-108] 108 (08/28 0835) Resp:  [16-20] 18 (08/28 0835) BP: (96-104)/(58-66) 104/66 (08/28 0835) SpO2:  [96 %-100 %] 99 % (08/28 0835) Weight:  [72.9 kg] 72.9 kg (08/28 0413)  Weight change:  Filed Weights   03/31/19 0350 04/01/19 0452 04/03/19 0413  Weight: 71.5 kg 71.1 kg 72.9 kg    Intake/Output:    Intake/Output Summary (Last 24 hours) at 04/03/2019 1022 Last data filed at 04/03/2019 0740 Gross per 24 hour  Intake 501.82 ml  Output 800 ml  Net -298.18 ml     Physical Exam:  Gen:                 Alert, cooperative, no distress, appears stated age Head:               Normocephalic, without obvious abnormality, atraumatic Eyes/ENT:       conjunctiva/corneas clear,  moist oral mucus membranes Neck:               Supple,  thyroid: not enlarged, no JVD Lungs:             Clear to auscultation bilaterally, respirations unlabored Heart:               Regular rhythm  Abdomen:        Soft, non-tender, urostomy bag in place Extremities:     no cyanosis, trace edema Skin:                Skin color, texture, turgor normal,scattered ecchymosis  Neurologic:      Alert and oriented, able to answer questions appropriately   Basic Metabolic Panel:  Recent Labs  Lab 03/30/19 0359 03/31/19 0406 04/01/19 0357 04/02/19 0352 04/03/19 0343  NA 128* 130* 129* 131* 133*  K 3.0* 2.8* 3.3* 3.8 3.8  CL 97* 100 100 102 105  CO2 19* 19* 19* 19* 20*  GLUCOSE 93 73 226* 259* 315*  BUN 78* 64* 67* 81* 80*  CREATININE 3.57* 2.92* 2.76* 2.60* 2.45*  CALCIUM 7.1* 7.0* 7.5* 7.7* 7.6*  MG 1.7 1.5* 2.6*  --   --   PHOS  5.2* 3.0  --   --   --      CBC: Recent Labs  Lab 03/29/19 2013 03/30/19 0359 03/31/19 0406 04/01/19 0357 04/03/19 0343  WBC 9.9 10.5 5.8 4.9 6.8  NEUTROABS 9.3*  --   --   --   --   HGB 9.5* 10.1* 8.3* 7.8* 7.6*  HCT 30.0* 31.1* 26.0* 25.1* 24.1*  MCV 85.7 83.6 84.4 85.7 85.8  PLT 199 240 162 158 172     No results found for: HEPBSAG, HEPBSAB, HEPBIGM    Microbiology:  Recent Results (from the past 240 hour(s))  Urine Culture     Status: Abnormal   Collection Time: 03/29/19 10:16 PM   Specimen: Urine, Clean Catch  Result Value Ref Range Status   Specimen Description   Final    URINE, CLEAN CATCH Performed at [facility], [address], [place], [place] [address]    Special Requests   Final    NONE  Performed at [facility], [address] [place], [place] [address]    Culture (A)  Final    50,000 COLONIES/mL KLEBSIELLA PNEUMONIAE Confirmed Extended Spectrum Beta-Lactamase Producer (ESBL).  In bloodstream infections from ESBL organisms, carbapenems are preferred over piperacillin/tazobactam. They are shown to have a lower risk of mortality. 20,000 COLONIES/mL PROVIDENCIA ALCALIFACIENS    Report Status 04/03/2019 FINAL  Final   Organism ID, Bacteria KLEBSIELLA PNEUMONIAE (A)  Final   Organism ID, Bacteria PROVIDENCIA ALCALIFACIENS (A)  Final      Susceptibility   Klebsiella pneumoniae - MIC*    AMPICILLIN >=32 RESISTANT Resistant     CEFAZOLIN >=64 RESISTANT Resistant     CEFTRIAXONE >=64 RESISTANT Resistant     CIPROFLOXACIN >=4 RESISTANT Resistant     GENTAMICIN >=16 RESISTANT Resistant     IMIPENEM <=0.25 SENSITIVE Sensitive     NITROFURANTOIN <=16 SENSITIVE Sensitive     TRIMETH/SULFA >=320 RESISTANT Resistant     AMPICILLIN/SULBACTAM >=32 RESISTANT Resistant     PIP/TAZO 32 INTERMEDIATE Intermediate     Extended ESBL POSITIVE Resistant     * 50,000 COLONIES/mL KLEBSIELLA PNEUMONIAE   Providencia alcalifaciens - MIC*     AMPICILLIN <=2 SENSITIVE Sensitive     CEFAZOLIN 16 SENSITIVE Sensitive     CEFTRIAXONE <=1 SENSITIVE Sensitive     CIPROFLOXACIN 2 INTERMEDIATE Intermediate     GENTAMICIN <=1 SENSITIVE Sensitive     IMIPENEM 0.5 SENSITIVE Sensitive     NITROFURANTOIN 128 RESISTANT Resistant     TRIMETH/SULFA <=20 SENSITIVE Sensitive     AMPICILLIN/SULBACTAM <=2 SENSITIVE Sensitive     PIP/TAZO <=4 SENSITIVE Sensitive     * 20,000 COLONIES/mL PROVIDENCIA ALCALIFACIENS  Novel Coronavirus, NAA (send-out to ref lab)     Status: None   Collection Time: 03/30/19 12:43 AM   Specimen: Nasopharyngeal Swab; Respiratory  Result Value Ref Range Status   SARS-CoV-2, NAA NOT DETECTED NOT DETECTED Final    Comment: (NOTE) This test was developed and its performance characteristics determined by [employer]. This test has not been FDA cleared or approved. This test has been authorized by FDA under an Emergency Use Authorization (EUA). This test is only authorized for the duration of time the declaration that circumstances exist justifying the authorization of the emergency use of in vitro diagnostic tests for detection of SARS-CoV-2 virus and/or diagnosis of COVID-19 infection under section 564(b)(1) of the Act, 21 U.S.C. 858IFO-2(D)(7), unless the authorization is terminated or revoked sooner. When diagnostic testing is negative, the possibility of a false negative result should be considered in the context of a patient's recent exposures and the presence of clinical signs and symptoms consistent with COVID-19. An individual without symptoms of COVID-19 and who is not shedding SARS-CoV-2 virus would expect to have a negative (not detected) result in this assay. Performed  At: [facility] [address] [place], [place] [id number] Rush Farmer MD HM:[id number]    [place]  Final    Comment: Performed at [facility], [address] [place], [place]  [address]  MRSA PCR Screening     Status: Abnormal   Collection Time: 03/30/19  1:31 AM   Specimen: Nasal Mucosa; Nasopharyngeal  Result Value Ref Range Status   MRSA by PCR POSITIVE (A) NEGATIVE Final    Comment:        The GeneXpert MRSA Assay (FDA approved for NASAL specimens only), is one component of a comprehensive MRSA colonization surveillance program. It is not intended to  diagnose MRSA infection nor to guide or monitor treatment for MRSA infections. RESULT CALLED TO, READ BACK BY AND VERIFIED WITH: Berna Bue RN AT [phone] ON 03/30/2019 [facility] Performed at [facility], [address] [place], [place] [address]   CULTURE, BLOOD (ROUTINE X 2) w Reflex to ID Panel     Status: None (Preliminary result)   Collection Time: 03/31/19  9:33 AM   Specimen: BLOOD  Result Value Ref Range Status   Specimen Description BLOOD BLOOD RIGHT HAND  Final   Special Requests   Final    BOTTLES DRAWN AEROBIC AND ANAEROBIC Blood Culture adequate volume   Culture   Final    NO GROWTH 3 DAYS Performed at [facility], [address], [place], [place] [address]    Report Status PENDING  Incomplete  CULTURE, BLOOD (ROUTINE X 2) w Reflex to ID Panel     Status: None (Preliminary result)   Collection Time: 03/31/19  9:47 AM   Specimen: BLOOD  Result Value Ref Range Status   Specimen Description BLOOD RIGHT ANTECUBITAL  Final   Special Requests   Final    BOTTLES DRAWN AEROBIC AND ANAEROBIC Blood Culture results may not be optimal due to an excessive volume of blood received in culture bottles   Culture   Final    NO GROWTH 3 DAYS Performed at [facility], [address] [place],  [address]    Report Status PENDING  Incomplete    Coagulation Studies: No results for input(s): LABPROT, INR in the last 72 hours.  Urinalysis: No results for input(s): COLORURINE, LABSPEC, PHURINE, GLUCOSEU, HGBUR, BILIRUBINUR, KETONESUR, PROTEINUR, UROBILINOGEN, NITRITE,  LEUKOCYTESUR in the last 72 hours.  Invalid input(s): APPERANCEUR    Imaging: Ct Chest Wo Contrast  Result Date: 04/01/2019 CLINICAL DATA:  78 year old male pneumonia, advanced COPD, hypoxemic respiratory failure. Clinical history also includes bladder cancer. EXAM: CT CHEST WITHOUT CONTRAST TECHNIQUE: Multidetector CT imaging of the chest was performed following the standard protocol without IV contrast. COMPARISON:  Prior CT scan of the chest, abdomen and pelvis 10/22/2007 FINDINGS: Cardiovascular: Limited evaluation in the absence of intravenous contrast. Atherosclerotic calcifications are present throughout the aorta. Patient is status post median sternotomy with evidence of prior multivessel CABG. The heart is at the upper limits of normal for size. No pericardial effusion. Extensive native coronary artery disease. Mediastinum/Nodes: Unremarkable CT appearance of the thyroid gland. No suspicious mediastinal or hilar adenopathy. No soft tissue mediastinal mass. The thoracic esophagus is unremarkable. Lungs/Pleura: Moderately severe combined centrilobular and paraseptal pulmonary emphysema. Architectural distortion and linear pleuroparenchymal scarring present in the posterior right upper lobe suggesting sequelae of a prior infarct or radiation treatment. Mild chronic atelectasis versus scarring in the lung bases. Diffuse mild bronchial wall thickening. Small left and trace right pleural effusions. Upper Abdomen: Incompletely imaged kidneys demonstrate bilateral hydronephrosis. Otherwise, no acute abnormality within the upper abdomen. Musculoskeletal: No acute fracture or aggressive appearing lytic or blastic osseous lesion. Stable L1 compression fracture with approximately 50% height loss. Healed median sternotomy. IMPRESSION: 1. No acute cardiopulmonary process. 2. Incompletely imaged bilateral hydronephrosis. Recommend clinical correlation with serum creatinine. 3. Moderately severe combined  centrilobular and paraseptal pulmonary emphysema. 4. Diffuse mild bronchial wall thickening suggests chronic bronchitis. 5. Areas of scarring and architectural distortion in the posterior right upper lobe and to a lesser extent in the bilateral lower lobes. 6. Small left and trace right pleural effusions. 7. Stable chronic L1 compression fracture. Aortic Atherosclerosis (ICD10-I70.0) and Emphysema (  ICD10-J43.9). Electronically Signed   By: Jacqulynn Cadet M.D.   On: 04/01/2019 19:26   US Renal  Result Date: 04/02/2019 CLINICAL DATA:  Renal failure, type II diabetes mellitus, hypertension, history bladder cancer EXAM: RENAL / URINARY TRACT ULTRASOUND COMPLETE COMPARISON:  CT abdomen and pelvis 03/25/2012 FINDINGS: Right Kidney: Renal measurements: 10.6 x 5.4 x 5.3 cm = volume: 157 mL. Cortical thinning and increased cortical echogenicity. Lobulated renal contours. Multiple renal cysts identified, most notably 2.4 x 2.5 x 2.7 cm diameter simple cyst and 2.1 x 2.0 x 2.4 cm diameter simple cyst at the upper pole of the RIGHT kidney. Significant hydronephrosis. No solid renal mass or shadowing calcification. Left Kidney: Renal measurements: 10.8 x 5.0 x 5.8 cm = volume: 164 mL. Cortical thinning and minimally increased cortical echogenicity. Significant hydronephrosis. Small cyst at inferior pole 11 x 11 x 12 mm. No solid mass or shadowing calcification. Bladder: Surgically absent, patient has urostomy IMPRESSION: Significant BILATERAL hydronephrosis, which could be due to obstruction or reflux in a patient with prior bladder resection and urostomy. BILATERAL renal cysts. Electronically Signed   By: Lavonia Dana M.D.   On: 04/02/2019 10:35     Medications:   . sodium chloride Stopped (04/02/19 1314)  . meropenem (MERREM) IV Stopped (04/02/19 2105)  . vancomycin Stopped (04/03/19 0015)   . sodium chloride   Intravenous Once  . acetylcysteine  4 mL Nebulization BID  . aspirin EC  81 mg Oral Daily  .  atorvastatin  80 mg Oral q1800  . budesonide (PULMICORT) nebulizer solution  0.25 mg Nebulization BID  . Chlorhexidine Gluconate Cloth  6 each Topical Daily  . glimepiride  2 mg Oral Q breakfast  . guaiFENesin  600 mg Oral BID  . heparin  5,000 Units Subcutaneous Q8H  . insulin aspart  0-20 Units Subcutaneous TID WC  . insulin aspart  0-5 Units Subcutaneous QHS  . insulin aspart  4 Units Subcutaneous TID WC  . insulin glargine  7 Units Subcutaneous Daily  . ipratropium-albuterol  3 mL Nebulization Q6H  . methylPREDNISolone (SOLU-MEDROL) injection  60 mg Intravenous Q12H  . metoprolol tartrate  12.5 mg Oral BID  . multivitamin-lutein  1 capsule Oral Daily  . mupirocin ointment  1 application Nasal BID  . Ensure Max Protein  11 oz Oral BID BM  . sodium bicarbonate  1,300 mg Oral TID   sodium chloride, acetaminophen, ondansetron (ZOFRAN) IV  Assessment/ Plan:  78 y.o.caucasian male with medical problems of T2DM, CKD st 4, CAD/CABG at [facility],COPD, h/o bladder cancer Dx 2008 s/p chemo, radiation with ileal conduit and urostomy, was admitted on 03/29/2019 with hypoglycemia and fatigue. [facility]  1. AKI on CKD st 4 AKI is likely secondary to hemodynamic changes/volume.  Creatinine on admission of 4.03   Outpatient baseline creatinine of 2.9-3.1.   improved with supportive care asd treatment of UTI.   May have established a new baseline of 2.5  2. Hypokalemia - replace potassium as needed -Level is in normal range  3.  Klebsiella UTI. Antibiotics as per primary team     LOS: [place] 8/28/202010:22 AM  [place], [place]  Note: This note was prepared with Dragon dictation. Any transcription errors are unintentional

## 2019-04-03 NOTE — Progress Notes (Addendum)
Inpatient Diabetes Program Recommendations  AACE/ADA: New Consensus Statement on Inpatient Glycemic Control (2015)  Target Ranges:  Prepandial:   less than 140 mg/dL      Peak postprandial:   less than 180 mg/dL (1-2 hours)      Critically ill patients:  140 - 180 mg/dL   Results for BIRDIE, BEVERIDGE (MRN [id number]) as of 04/03/2019 08:03  Ref. Range 04/02/2019 07:44 04/02/2019 11:41 04/02/2019 16:35 04/02/2019 22:30  Glucose-Capillary Latest Ref Range: 70 - 99 mg/dL 223 (H)  7 units NOVOLOG  340 (H)  15 units NOVOLOG  361 (H)  20 units NOVOLOG  349 (H)  4 units NOVOLOG    Results for SYED, ZUKAS (MRN [id number]) as of 04/03/2019 08:03  Ref. Range 04/03/2019 03:43  Glucose Latest Ref Range: 70 - 99 mg/dL 315 (H)   Results for BIRD, SWETZ (MRN [id number]) as of 04/03/2019 08:03  Ref. Range 02/14/2019 16:15  Hemoglobin A1C Latest Ref Range: 4.8 - 5.6 % 7.1 (H)     Home DM Meds: Amaryl 2 mg Daily   Current Orders: Novolog Resistant Correction Scale/ SSI (0-20 units) TID AC + HS      Amaryl 2 mg Daily     MD- Note patient getting Solumedrol 60 mg BID.  Note also that home dose of Amaryl to start this AM.  Lab glucose 315 mg/dl this AM.  Please consider the following if plan is to continue IV steroids:  1. Start Lantus 7 units Daily (0.1 units/kg dosing)  2. Start Novolog Meal Coverage: Novolog 4 units TID with meals  (Please add the following Hold Parameters: Hold if pt eats <50% of meal, Hold if pt NPO)     --Will follow patient during hospitalization--  Wyn Quaker RN, MSN, CDE Diabetes Coordinator Inpatient Glycemic Control Team Team Pager: [phone] (8a-5p)

## 2019-04-03 NOTE — Progress Notes (Signed)
[place] [facility]                                                                                                                                                                                  Patient Demographics   Bryan Brewer, is a 78 y.o. male, DOB - [birth year], XTK:[id number]  Admit date - 03/29/2019   Admitting Physician Lance Coon, MD  Outpatient Primary MD for the patient is Glendon Axe, MD   LOS - 5  Subjective: Patient continues to complain of shortness of breath and cough   Review of Systems:   CONSTITUTIONAL: No documented fever. No fatigue, weakness. No weight gain, no weight loss.  EYES: No blurry or double vision.  ENT: No tinnitus. No postnasal drip. No redness of the oropharynx.  RESPIRATORY: Positive cough, no wheeze, no hemoptysis.  Positive dyspnea.  CARDIOVASCULAR: No chest pain. No orthopnea. No palpitations. No syncope.  GASTROINTESTINAL: No nausea, no vomiting or diarrhea. No abdominal pain. No melena or hematochezia.  GENITOURINARY: No dysuria or hematuria.  ENDOCRINE: No polyuria or nocturia. No heat or cold intolerance.  HEMATOLOGY: No anemia. No bruising. No bleeding.  INTEGUMENTARY: No rashes. No lesions.  MUSCULOSKELETAL: No arthritis. No swelling. No gout.  NEUROLOGIC: No numbness, tingling, or ataxia. No seizure-type activity.  PSYCHIATRIC: No anxiety. No insomnia. No ADD.    Vitals:   Vitals:   04/03/19 0835 04/03/19 1156 04/03/19 1220 04/03/19 1326  BP: 104/66 104/72 116/75   Pulse: (!) 108 (!) 105 92   Resp: 18 18 18    Temp: 98.4 F (36.9 C) 98.3 F (36.8 C) 97.8 F (36.6 C)   TempSrc: Oral Oral Oral   SpO2: 99% 99% 99% 98%  Weight:      Height:        Wt Readings from Last 3 Encounters:  04/03/19 72.9 kg  02/15/19 73.4 kg  11/01/17 72.5 kg     Intake/Output Summary (Last 24 hours) at 04/03/2019 1446 Last data filed at 04/03/2019 1205 Gross per 24 hour  Intake 366.95 ml  Output 800 ml   Net -433.05 ml    Physical Exam:   GENERAL: Pleasant-appearing in no apparent distress.  HEAD, EYES, EARS, NOSE AND THROAT: Atraumatic, normocephalic. Extraocular muscles are intact. Pupils equal and reactive to light. Sclerae anicteric. No conjunctival injection. No oro-pharyngeal erythema.  NECK: Supple. There is no jugular venous distention. No bruits, no lymphadenopathy, no thyromegaly.  HEART: Regular rate and rhythm,. No murmurs, no rubs, no clicks.  LUNGS: Clear to auscultation bilaterally. No rales or rhonchi. No wheezes.  ABDOMEN: Soft, flat, nontender, nondistended. Has good bowel sounds. No hepatosplenomegaly appreciated.  EXTREMITIES: No  evidence of any cyanosis, clubbing, or peripheral edema.  +2 pedal and radial pulses bilaterally.  NEUROLOGIC: The patient is alert, awake, and oriented x3 with no focal motor or sensory deficits appreciated bilaterally.  SKIN: Moist and warm with no rashes appreciated.  Psych: Not anxious, depressed LN: No inguinal LN enlargement    Antibiotics   Anti-infectives (From admission, onward)   Start     Dose/Rate Route Frequency Ordered Stop   04/02/19 2215  vancomycin (VANCOCIN) 1,250 mg in sodium chloride 0.9 % 250 mL IVPB  Status:  Discontinued     1,250 mg 166.7 mL/hr over 90 Minutes Intravenous Every 48 hours 04/02/19 2207 04/03/19 1111   04/02/19 2200  vancomycin (VANCOCIN) 1,250 mg in sodium chloride 0.9 % 250 mL IVPB  Status:  Discontinued     1,250 mg 166.7 mL/hr over 90 Minutes Intravenous Every 48 hours 04/02/19 0902 04/02/19 2207   04/02/19 1000  vancomycin (VANCOCIN) 1,250 mg in sodium chloride 0.9 % 250 mL IVPB  Status:  Discontinued     1,250 mg 166.7 mL/hr over 90 Minutes Intravenous Every 48 hours 04/02/19 0747 04/02/19 0756   04/02/19 1000  vancomycin (VANCOCIN) 1,250 mg in sodium chloride 0.9 % 250 mL IVPB  Status:  Discontinued     1,250 mg 166.7 mL/hr over 90 Minutes Intravenous Every 48 hours 04/02/19 0752 04/02/19  0902   04/02/19 1000  meropenem (MERREM) 500 mg in sodium chloride 0.9 % 100 mL IVPB  Status:  Discontinued     500 mg 200 mL/hr over 30 Minutes Intravenous Every 12 hours 04/02/19 0922 04/03/19 1111   03/31/19 1000  vancomycin (VANCOCIN) IVPB 1000 mg/200 mL premix     1,000 mg 200 mL/hr over 60 Minutes Intravenous  Once 03/31/19 0837 03/31/19 1238   03/30/19 2200  ceFEPIme (MAXIPIME) 2 g in sodium chloride 0.9 % 100 mL IVPB  Status:  Discontinued     2 g 200 mL/hr over 30 Minutes Intravenous Every 24 hours 03/30/19 0809 04/02/19 0914   03/29/19 2354  vancomycin variable dose per unstable renal function (pharmacist dosing)  Status:  Discontinued      Does not apply See admin instructions 03/29/19 2354 04/02/19 0747   03/29/19 2330  vancomycin (VANCOCIN) 1,500 mg in sodium chloride 0.9 % 500 mL IVPB     1,500 mg 250 mL/hr over 120 Minutes Intravenous  Once 03/29/19 2248 03/30/19 0213   03/29/19 2245  vancomycin (VANCOCIN) IVPB 1000 mg/200 mL premix  Status:  Discontinued     1,000 mg 200 mL/hr over 60 Minutes Intravenous  Once 03/29/19 2242 03/29/19 2246   03/29/19 2245  ceFEPIme (MAXIPIME) 2 g in sodium chloride 0.9 % 100 mL IVPB     2 g 200 mL/hr over 30 Minutes Intravenous  Once 03/29/19 2242 03/29/19 2358      Medications   Scheduled Meds: . acetylcysteine  4 mL Nebulization BID  . aspirin EC  81 mg Oral Daily  . atorvastatin  80 mg Oral q1800  . budesonide (PULMICORT) nebulizer solution  0.25 mg Nebulization BID  . Chlorhexidine Gluconate Cloth  6 each Topical Daily  . glimepiride  2 mg Oral Q breakfast  . guaiFENesin  600 mg Oral BID  . heparin  5,000 Units Subcutaneous Q8H  . insulin aspart  0-20 Units Subcutaneous TID WC  . insulin aspart  0-5 Units Subcutaneous QHS  . insulin aspart  4 Units Subcutaneous TID WC  . insulin glargine  7 Units Subcutaneous  Daily  . ipratropium-albuterol  3 mL Nebulization Q6H  . methylPREDNISolone (SOLU-MEDROL) injection  60 mg Intravenous  Q12H  . metoprolol tartrate  12.5 mg Oral BID  . multivitamin-lutein  1 capsule Oral Daily  . mupirocin ointment  1 application Nasal BID  . Ensure Max Protein  11 oz Oral BID BM  . sodium bicarbonate  1,300 mg Oral TID   Continuous Infusions: . sodium chloride Stopped (04/02/19 1314)   PRN Meds:.sodium chloride, acetaminophen, ondansetron (ZOFRAN) IV   Data Review:   Micro Results Recent Results (from the past 240 hour(s))  Urine Culture     Status: Abnormal   Collection Time: 03/29/19 10:16 PM   Specimen: Urine, Clean Catch  Result Value Ref Range Status   Specimen Description   Final    URINE, CLEAN CATCH Performed at [facility], [address] [place], [place] [address]    Special Requests   Final    NONE Performed at [facility], [address], [place], [place] [address]    Culture (A)  Final    50,000 COLONIES/mL KLEBSIELLA PNEUMONIAE Confirmed Extended Spectrum Beta-Lactamase Producer (ESBL).  In bloodstream infections from ESBL organisms, carbapenems are preferred over piperacillin/tazobactam. They are shown to have a lower risk of mortality. 20,000 COLONIES/mL PROVIDENCIA ALCALIFACIENS    Report Status 04/03/2019 FINAL  Final   Organism ID, Bacteria KLEBSIELLA PNEUMONIAE (A)  Final   Organism ID, Bacteria PROVIDENCIA ALCALIFACIENS (A)  Final      Susceptibility   Klebsiella pneumoniae - MIC*    AMPICILLIN >=32 RESISTANT Resistant     CEFAZOLIN >=64 RESISTANT Resistant     CEFTRIAXONE >=64 RESISTANT Resistant     CIPROFLOXACIN >=4 RESISTANT Resistant     GENTAMICIN >=16 RESISTANT Resistant     IMIPENEM <=0.25 SENSITIVE Sensitive     NITROFURANTOIN <=16 SENSITIVE Sensitive     TRIMETH/SULFA >=320 RESISTANT Resistant     AMPICILLIN/SULBACTAM >=32 RESISTANT Resistant     PIP/TAZO 32 INTERMEDIATE Intermediate     Extended ESBL POSITIVE Resistant     * 50,000 COLONIES/mL KLEBSIELLA PNEUMONIAE   Providencia alcalifaciens - MIC*     AMPICILLIN <=2 SENSITIVE Sensitive     CEFAZOLIN 16 SENSITIVE Sensitive     CEFTRIAXONE <=1 SENSITIVE Sensitive     CIPROFLOXACIN 2 INTERMEDIATE Intermediate     GENTAMICIN <=1 SENSITIVE Sensitive     IMIPENEM 0.5 SENSITIVE Sensitive     NITROFURANTOIN 128 RESISTANT Resistant     TRIMETH/SULFA <=20 SENSITIVE Sensitive     AMPICILLIN/SULBACTAM <=2 SENSITIVE Sensitive     PIP/TAZO <=4 SENSITIVE Sensitive     * 20,000 COLONIES/mL PROVIDENCIA ALCALIFACIENS  Novel Coronavirus, NAA (send-out to ref lab)     Status: None   Collection Time: 03/30/19 12:43 AM   Specimen: Nasopharyngeal Swab; Respiratory  Result Value Ref Range Status   SARS-CoV-2, NAA NOT DETECTED NOT DETECTED Final    Comment: (NOTE) This test was developed and its performance characteristics determined by [employer]. This test has not been FDA cleared or approved. This test has been authorized by FDA under an Emergency Use Authorization (EUA). This test is only authorized for the duration of time the declaration that circumstances exist justifying the authorization of the emergency use of in vitro diagnostic tests for detection of SARS-CoV-2 virus and/or diagnosis of COVID-19 infection under section 564(b)(1) of the Act, 21 U.S.C. 656CLE-7(N)(1), unless the authorization is terminated or revoked sooner. When diagnostic testing is negative, the possibility of a false  negative result should be considered in the context of a patient's recent exposures and the presence of clinical signs and symptoms consistent with COVID-19. An individual without symptoms of COVID-19 and who is not shedding SARS-CoV-2 virus would expect to have a negative (not detected) result in this assay. Performed  At: [facility] [address] [place], [place] [id number] Rush Farmer MD DJ:[id number]    [place]  Final    Comment: Performed at [facility], [address] [place], [place]  [address]  MRSA PCR Screening     Status: Abnormal   Collection Time: 03/30/19  1:31 AM   Specimen: Nasal Mucosa; Nasopharyngeal  Result Value Ref Range Status   MRSA by PCR POSITIVE (A) NEGATIVE Final    Comment:        The GeneXpert MRSA Assay (FDA approved for NASAL specimens only), is one component of a comprehensive MRSA colonization surveillance program. It is not intended to diagnose MRSA infection nor to guide or monitor treatment for MRSA infections. RESULT CALLED TO, READ BACK BY AND VERIFIED WITH: Berna Bue RN AT [phone] ON 03/30/2019 [facility] Performed at [facility], [address] [place], [place] [address]   CULTURE, BLOOD (ROUTINE X 2) w Reflex to ID Panel     Status: None (Preliminary result)   Collection Time: 03/31/19  9:33 AM   Specimen: BLOOD  Result Value Ref Range Status   Specimen Description BLOOD BLOOD RIGHT HAND  Final   Special Requests   Final    BOTTLES DRAWN AEROBIC AND ANAEROBIC Blood Culture adequate volume   Culture   Final    NO GROWTH 3 DAYS Performed at [facility], [address], [place], [place] [address]    Report Status PENDING  Incomplete  CULTURE, BLOOD (ROUTINE X 2) w Reflex to ID Panel     Status: None (Preliminary result)   Collection Time: 03/31/19  9:47 AM   Specimen: BLOOD  Result Value Ref Range Status   Specimen Description BLOOD RIGHT ANTECUBITAL  Final   Special Requests   Final    BOTTLES DRAWN AEROBIC AND ANAEROBIC Blood Culture results may not be optimal due to an excessive volume of blood received in culture bottles   Culture   Final    NO GROWTH 3 DAYS Performed at [facility], [address] [place],  [address]    Report Status PENDING  Incomplete    Radiology Reports Ct Chest Wo Contrast  Result Date: 04/01/2019 CLINICAL DATA:  78 year old male pneumonia, advanced COPD, hypoxemic respiratory failure. Clinical history also includes bladder cancer. EXAM: CT CHEST WITHOUT  CONTRAST TECHNIQUE: Multidetector CT imaging of the chest was performed following the standard protocol without IV contrast. COMPARISON:  Prior CT scan of the chest, abdomen and pelvis 10/22/2007 FINDINGS: Cardiovascular: Limited evaluation in the absence of intravenous contrast. Atherosclerotic calcifications are present throughout the aorta. Patient is status post median sternotomy with evidence of prior multivessel CABG. The heart is at the upper limits of normal for size. No pericardial effusion. Extensive native coronary artery disease. Mediastinum/Nodes: Unremarkable CT appearance of the thyroid gland. No suspicious mediastinal or hilar adenopathy. No soft tissue mediastinal mass. The thoracic esophagus is unremarkable. Lungs/Pleura: Moderately severe combined centrilobular and paraseptal pulmonary emphysema. Architectural distortion and linear pleuroparenchymal scarring present in the posterior right upper lobe suggesting sequelae of a prior infarct or radiation treatment. Mild chronic atelectasis versus scarring in the lung bases. Diffuse mild bronchial wall thickening. Small  left and trace right pleural effusions. Upper Abdomen: Incompletely imaged kidneys demonstrate bilateral hydronephrosis. Otherwise, no acute abnormality within the upper abdomen. Musculoskeletal: No acute fracture or aggressive appearing lytic or blastic osseous lesion. Stable L1 compression fracture with approximately 50% height loss. Healed median sternotomy. IMPRESSION: 1. No acute cardiopulmonary process. 2. Incompletely imaged bilateral hydronephrosis. Recommend clinical correlation with serum creatinine. 3. Moderately severe combined centrilobular and paraseptal pulmonary emphysema. 4. Diffuse mild bronchial wall thickening suggests chronic bronchitis. 5. Areas of scarring and architectural distortion in the posterior right upper lobe and to a lesser extent in the bilateral lower lobes. 6. Small left and trace right pleural  effusions. 7. Stable chronic L1 compression fracture. Aortic Atherosclerosis (ICD10-I70.0) and Emphysema (ICD10-J43.9). Electronically Signed   By: Jacqulynn Cadet M.D.   On: 04/01/2019 19:26   US Renal  Result Date: 04/02/2019 CLINICAL DATA:  Renal failure, type II diabetes mellitus, hypertension, history bladder cancer EXAM: RENAL / URINARY TRACT ULTRASOUND COMPLETE COMPARISON:  CT abdomen and pelvis 03/25/2012 FINDINGS: Right Kidney: Renal measurements: 10.6 x 5.4 x 5.3 cm = volume: 157 mL. Cortical thinning and increased cortical echogenicity. Lobulated renal contours. Multiple renal cysts identified, most notably 2.4 x 2.5 x 2.7 cm diameter simple cyst and 2.1 x 2.0 x 2.4 cm diameter simple cyst at the upper pole of the RIGHT kidney. Significant hydronephrosis. No solid renal mass or shadowing calcification. Left Kidney: Renal measurements: 10.8 x 5.0 x 5.8 cm = volume: 164 mL. Cortical thinning and minimally increased cortical echogenicity. Significant hydronephrosis. Small cyst at inferior pole 11 x 11 x 12 mm. No solid mass or shadowing calcification. Bladder: Surgically absent, patient has urostomy IMPRESSION: Significant BILATERAL hydronephrosis, which could be due to obstruction or reflux in a patient with prior bladder resection and urostomy. BILATERAL renal cysts. Electronically Signed   By: Lavonia Dana M.D.   On: 04/02/2019 10:35   Dg Chest Port 1 View  Result Date: 04/01/2019 CLINICAL DATA:  Pulmonary disease. EXAM: PORTABLE CHEST 1 VIEW COMPARISON:  03/29/2019 and 02/15/2019 and October 29, 2017 FINDINGS: The heart size and pulmonary vascularity are normal. CABG. The accentuation of the interstitial markings has returned to baseline since the prior study. The patient has bilateral chronic interstitial disease with no acute abnormalities. No effusions. No significant bone abnormality. IMPRESSION: No acute cardiopulmonary disease. Chronic interstitial lung disease. Electronically Signed   By:  Lorriane Shire M.D.   On: 04/01/2019 09:18   Dg Chest Portable 1 View  Result Date: 03/29/2019 CLINICAL DATA:  Weakness EXAM: PORTABLE CHEST 1 VIEW COMPARISON:  02/15/2019, 10/30/2017, 08/09/2016 FINDINGS: Post sternotomy changes. Cardiomegaly. Coarse bilateral interstitial opacity, suspect for chronic interstitial change. Streaky superimposed opacity in the left mid and lower lung. Aortic atherosclerosis. No pneumothorax. IMPRESSION: 1. Cardiomegaly. 2. Streaky left mid and lower lung opacities, suspect for acute infectious or inflammatory process superimposed on underlying chronic disease. Electronically Signed   By: Donavan Foil M.D.   On: 03/29/2019 21:16     CBC Recent Labs  Lab 03/29/19 2013 03/30/19 0359 03/31/19 0406 04/01/19 0357 04/03/19 0343  WBC 9.9 10.5 5.8 4.9 6.8  HGB 9.5* 10.1* 8.3* 7.8* 7.6*  HCT 30.0* 31.1* 26.0* 25.1* 24.1*  PLT 199 240 162 158 172  MCV 85.7 83.6 84.4 85.7 85.8  MCH 27.1 27.2 26.9 26.6 27.0  MCHC 31.7 32.5 31.9 31.1 31.5  RDW 15.4 15.2 15.3 14.9 15.3  LYMPHSABS 0.1*  --   --   --   --  MONOABS 0.6  --   --   --   --   EOSABS 0.0  --   --   --   --   BASOSABS 0.0  --   --   --   --     Chemistries  Recent Labs  Lab 03/30/19 0359 03/31/19 0406 04/01/19 0357 04/02/19 0352 04/03/19 0343  NA 128* 130* 129* 131* 133*  K 3.0* 2.8* 3.3* 3.8 3.8  CL 97* 100 100 102 105  CO2 19* 19* 19* 19* 20*  GLUCOSE 93 73 226* 259* 315*  BUN 78* 64* 67* 81* 80*  CREATININE 3.57* 2.92* 2.76* 2.60* 2.45*  CALCIUM 7.1* 7.0* 7.5* 7.7* 7.6*  MG 1.7 1.5* 2.6*  --   --   AST 26  --   --   --   --   ALT 14  --   --   --   --   ALKPHOS 55  --   --   --   --   BILITOT 1.0  --   --   --   --    ------------------------------------------------------------------------------------------------------------------ estimated creatinine clearance is 23.6 mL/min (A) (by C-G formula based on SCr of 2.45 mg/dL  (H)). ------------------------------------------------------------------------------------------------------------------ No results for input(s): HGBA1C in the last 72 hours. ------------------------------------------------------------------------------------------------------------------ No results for input(s): CHOL, HDL, LDLCALC, TRIG, CHOLHDL, LDLDIRECT in the last 72 hours. ------------------------------------------------------------------------------------------------------------------ No results for input(s): TSH, T4TOTAL, T3FREE, THYROIDAB in the last 72 hours.  Invalid input(s): FREET3 ------------------------------------------------------------------------------------------------------------------ Recent Labs    04/03/19 0343 04/03/19 0900  VITAMINB12  --  228  FOLATE 6.2  --   FERRITIN 368*  --   TIBC 125*  --   IRON 48  --   RETICCTPCT 1.9  --     Coagulation profile Recent Labs  Lab 03/30/19 1128  INR 1.3*    No results for input(s): DDIMER in the last 72 hours.  Cardiac Enzymes No results for input(s): CKMB, TROPONINI, MYOGLOBIN in the last 168 hours.  Invalid input(s): CK ------------------------------------------------------------------------------------------------------------------ Invalid input(s): [place]  Patient 78 year old admitted with sepsis  Severe sepsis with septic shock ([place]) - CT scan shows bronchitis and severe COPD change Continue nebs and steroids   Acute on chronic renal anemia Transfuse packed RBCs   Acute on chronic renal failure (HCC) -due to sepsis, dehydration.  Renal functions improved, patient has chronic hydronephrosis seen by urology  UTI (urinary tract infection) -IV antibiotics, showed Klebsiella  Hyponatremia -follow sodium Barbuto  Diabetes type II with renal complications continue sliding scale for now, discussed with the patient regarding close monitoring his blood sugar follow-up with  primary care provider and adhering to a carbohydrate consistent diet  HTN (hypertension) -hold antihypertensives as the patient is currently hypotensive on pressors  Acute on chronic COPD (chronic obstructive pulmonary disease) ([place]) -exacerbation Treat with nebs Pulmicort and steroids  HLD (hyperlipidemia) -continue home dose antilipid     Code Status Orders  (From admission, onward)         Start     Ordered   03/30/19 0124  Full code  Continuous     03/30/19 0124        Code Status History    Date Active Date Inactive Code Status Order ID Comments User Context   02/14/2019 2330 02/16/2019 1909 Full Code [id number]  Henreitta Leber, MD Inpatient   10/30/2017 0203 11/01/2017 2259 Full Code [id number]  Lance Coon, MD Inpatient   Advance Care  Planning Activity           Consults  intesivist  DVT Prophylaxis  heparin  Lab Results  Component Value Date   PLT 172 04/03/2019     Time Spent in minutes  96min Greater than 50% of time spent in care coordination and counseling patient regarding the condition and plan of care.   Dustin Flock M.D on 04/03/2019 at 2:46 PM  Between 7am to 6pm - Pager - [phone]  After 6pm go to [email or web address] - Proofreader  [employer]   Office  [phone]

## 2019-04-03 NOTE — Consult Note (Signed)
Urology Consult  I have been asked to see the patient by Dr. Posey Pronto, for evaluation and management of bilateral hydronephrosis and a positive urine culture and an ileal conduit patient.  Chief Complaint: Positive urine culture, bilateral hydronephrosis  History of Present Illness: Bryan Brewer is a 78 y.o. year old male with PMH COPD, hyperlipidemia, hypertension, CKD, DM2 admitted on 03/29/2019 with sepsis and COPD exacerbation.  Creatinine 4.03 upon admission, since downtrending and believed to be secondary to sepsis and dehydration that has since been corrected.  Patient is s/p cystoprostatectomy and urostomy with ileal conduit on 07/08/2012 with Dr. Lyndel Pleasure, managed by [facility] as recently as 11/26/2018.  Since surgery, he has developed a parastomal hernia that was originally repaired on 03/20/2013 with simultaneous bilateral reanastomosis and stent placement.  He has also had PCNs placed in the past.  PCNs and stents all discontinued at this time.  Parastomal hernia has recurred.  Patient has known moderate bilateral hydronephrosis and renal cysts at baseline, most recently imaged via ultrasound on 11/25/2018 at [facility]; his baseline creatinine is between 2 and 4 since 2015.  Blood cultures negative, urine culture returned with 50k colonies/mL ESBL-producing Klebsiella pneumoniae, sensitive only to imipenem and nitrofurantoin and 20k colonies/mL Providencia alcalifaciens.  Inpatient antibiotics as below.  Creatinine now 2.45.  White count fluctuating, now at 6.8. Patient is afebrile and intermittently hypotensive.    Today, he reports feeling well.  He states that he did notice some thick, purulent urinary output with intermittent gross hematuria just prior to admission that has since resolved.  He is unsure when his next follow-up with [facility] is scheduled.   Anti-infectives (From admission, onward)   Start     Dose/Rate Route Frequency Ordered Stop   04/02/19 2215  vancomycin (VANCOCIN) 1,250  mg in sodium chloride 0.9 % 250 mL IVPB     1,250 mg 166.7 mL/hr over 90 Minutes Intravenous Every 48 hours 04/02/19 2207     04/02/19 2200  vancomycin (VANCOCIN) 1,250 mg in sodium chloride 0.9 % 250 mL IVPB  Status:  Discontinued     1,250 mg 166.7 mL/hr over 90 Minutes Intravenous Every 48 hours 04/02/19 0902 04/02/19 2207   04/02/19 1000  vancomycin (VANCOCIN) 1,250 mg in sodium chloride 0.9 % 250 mL IVPB  Status:  Discontinued     1,250 mg 166.7 mL/hr over 90 Minutes Intravenous Every 48 hours 04/02/19 0747 04/02/19 0756   04/02/19 1000  vancomycin (VANCOCIN) 1,250 mg in sodium chloride 0.9 % 250 mL IVPB  Status:  Discontinued     1,250 mg 166.7 mL/hr over 90 Minutes Intravenous Every 48 hours 04/02/19 0752 04/02/19 0902   04/02/19 1000  meropenem (MERREM) 500 mg in sodium chloride 0.9 % 100 mL IVPB     500 mg 200 mL/hr over 30 Minutes Intravenous Every 12 hours 04/02/19 0922     03/31/19 1000  vancomycin (VANCOCIN) IVPB 1000 mg/200 mL premix     1,000 mg 200 mL/hr over 60 Minutes Intravenous  Once 03/31/19 0837 03/31/19 1238   03/30/19 2200  ceFEPIme (MAXIPIME) 2 g in sodium chloride 0.9 % 100 mL IVPB  Status:  Discontinued     2 g 200 mL/hr over 30 Minutes Intravenous Every 24 hours 03/30/19 0809 04/02/19 0914   03/29/19 2354  vancomycin variable dose per unstable renal function (pharmacist dosing)  Status:  Discontinued      Does not apply See admin instructions 03/29/19 2354 04/02/19 0747   03/29/19  2330  vancomycin (VANCOCIN) 1,500 mg in sodium chloride 0.9 % 500 mL IVPB     1,500 mg 250 mL/hr over 120 Minutes Intravenous  Once 03/29/19 2248 03/30/19 0213   03/29/19 2245  vancomycin (VANCOCIN) IVPB 1000 mg/200 mL premix  Status:  Discontinued     1,000 mg 200 mL/hr over 60 Minutes Intravenous  Once 03/29/19 2242 03/29/19 2246   03/29/19 2245  ceFEPIme (MAXIPIME) 2 g in sodium chloride 0.9 % 100 mL IVPB     2 g 200 mL/hr over 30 Minutes Intravenous  Once 03/29/19 2242 03/29/19  2358      Past Medical History:  Diagnosis Date  . Bladder cancer ([place])   . COPD (chronic obstructive pulmonary disease) ([place])   . Hyperlipidemia   . Hypertension   . Kidney disease   . Type 2 diabetes mellitus ([place])     Past Surgical History:  Procedure Laterality Date  . LEFT HEART CATH AND CORONARY ANGIOGRAPHY N/A 10/31/2017   Procedure: LEFT HEART CATH AND CORONARY ANGIOGRAPHY;  Surgeon: Yolonda Kida, MD;  Location: [facility];  Service: Cardiovascular;  Laterality: N/A;  . REVISION UROSTOMY CUTANEOUS      Home Medications:  Current Meds  Medication Sig  . atorvastatin (LIPITOR) 80 MG tablet Take 80 mg by mouth at bedtime.  . furosemide (LASIX) 80 MG tablet Take 80 mg by mouth daily.  Marland Kitchen glimepiride (AMARYL) 2 MG tablet Take 2 mg by mouth daily with breakfast.  . hydrALAZINE (APRESOLINE) 50 MG tablet Take 50 mg by mouth 3 (three) times daily.   . isosorbide mononitrate (IMDUR) 60 MG 24 hr tablet Take 60 mg by mouth daily.   . metoprolol tartrate (LOPRESSOR) 50 MG tablet Take 50 mg by mouth 2 (two) times daily.  . sodium bicarbonate 650 MG tablet Take 1,300 mg by mouth 3 (three) times daily.  Marland Kitchen spironolactone (ALDACTONE) 25 MG tablet Take 12.5 mg by mouth daily.    Allergies: No Known Allergies  Family History  Problem Relation Age of Onset  . CAD Mother   . Stroke Mother   . CAD Father   . Stroke Father   . Breast cancer Sister     Social History:  reports that he quit smoking about 7 years ago. His smoking use included cigarettes. He has a 52.50 pack-year smoking history. He has never used smokeless tobacco. He reports that he does not drink alcohol or use drugs.  ROS: A complete review of systems was performed.  All systems are negative except for pertinent findings as noted.  Physical Exam:  Vital signs in last 24 hours: Temp:  [97.4 F (36.3 C)-98.6 F (37 C)] 98.4 F (36.9 C) (08/28 0835) Pulse Rate:  [86-108] 108 (08/28 0835) Resp:   [16-20] 18 (08/28 0835) BP: (96-104)/(58-66) 104/66 (08/28 0835) SpO2:  [96 %-100 %] 99 % (08/28 0835) Weight:  [72.9 kg] 72.9 kg (08/28 0413) Constitutional:  Alert and oriented, no acute distress HEENT: [place] AT, moist mucus membranes Cardiovascular: No clubbing, cyanosis, or edema. Respiratory: Normal respiratory effort, no respiratory distress GI: Abdomen is soft, nontender, nondistended.  Nontender parastomal hernia noted with no overlying erythema. GU: Urostomy present in the LLQ draining clear, yellow urine Skin: No rashes, bruises or suspicious lesions Neurologic: Grossly intact, no focal deficits, moving all 4 extremities Psychiatric: Normal mood and affect  Laboratory Data:  Recent Labs    04/01/19 0357 04/03/19 0343  WBC 4.9 6.8  HGB 7.8* 7.6*  HCT  25.1* 24.1*   Recent Labs    04/01/19 0357 04/02/19 0352 04/03/19 0343  NA 129* 131* 133*  K 3.3* 3.8 3.8  CL 100 102 105  CO2 19* 19* 20*  GLUCOSE 226* 259* 315*  BUN 67* 81* 80*  CREATININE 2.76* 2.60* 2.45*  CALCIUM 7.5* 7.7* 7.6*   Urinalysis    Component Value Date/Time   COLORURINE YELLOW (A) 03/29/2019 2216   APPEARANCEUR CLOUDY (A) 03/29/2019 2216   LABSPEC 1.006 03/29/2019 2216   PHURINE 7.0 03/29/2019 [place] 03/29/2019 2216   HGBUR MODERATE (A) 03/29/2019 2216   [place] NEGATIVE 03/29/2019 2216   [place] 03/29/2019 2216   PROTEINUR 30 (A) 03/29/2019 2216   NITRITE NEGATIVE 03/29/2019 2216   LEUKOCYTESUR LARGE (A) 03/29/2019 2216   Results for orders placed or performed during the hospital encounter of 03/29/19  Urine Culture     Status: Abnormal   Collection Time: 03/29/19 10:16 PM   Specimen: Urine, Clean Catch  Result Value Ref Range Status   Specimen Description   Final    URINE, CLEAN CATCH Performed at [facility], [address] [place], [place] [address]    Special Requests   Final    NONE Performed at [facility], [address] [place], [place] [address]    Culture (A)  Final    50,000 COLONIES/mL KLEBSIELLA PNEUMONIAE Confirmed Extended Spectrum Beta-Lactamase Producer (ESBL).  In bloodstream infections from ESBL organisms, carbapenems are preferred over piperacillin/tazobactam. They are shown to have a lower risk of mortality. 20,000 COLONIES/mL PROVIDENCIA ALCALIFACIENS    Report Status 04/03/2019 FINAL  Final   Organism ID, Bacteria KLEBSIELLA PNEUMONIAE (A)  Final   Organism ID, Bacteria PROVIDENCIA ALCALIFACIENS (A)  Final      Susceptibility   Klebsiella pneumoniae - MIC*    AMPICILLIN >=32 RESISTANT Resistant     CEFAZOLIN >=64 RESISTANT Resistant     CEFTRIAXONE >=64 RESISTANT Resistant     CIPROFLOXACIN >=4 RESISTANT Resistant     GENTAMICIN >=16 RESISTANT Resistant     IMIPENEM <=0.25 SENSITIVE Sensitive     NITROFURANTOIN <=16 SENSITIVE Sensitive     TRIMETH/SULFA >=320 RESISTANT Resistant     AMPICILLIN/SULBACTAM >=32 RESISTANT Resistant     PIP/TAZO 32 INTERMEDIATE Intermediate     Extended ESBL POSITIVE Resistant     * 50,000 COLONIES/mL KLEBSIELLA PNEUMONIAE   Providencia alcalifaciens - MIC*    AMPICILLIN <=2 SENSITIVE Sensitive     CEFAZOLIN 16 SENSITIVE Sensitive     CEFTRIAXONE <=1 SENSITIVE Sensitive     CIPROFLOXACIN 2 INTERMEDIATE Intermediate     GENTAMICIN <=1 SENSITIVE Sensitive     IMIPENEM 0.5 SENSITIVE Sensitive     NITROFURANTOIN 128 RESISTANT Resistant     TRIMETH/SULFA <=20 SENSITIVE Sensitive     AMPICILLIN/SULBACTAM <=2 SENSITIVE Sensitive     PIP/TAZO <=4 SENSITIVE Sensitive     * 20,000 COLONIES/mL PROVIDENCIA ALCALIFACIENS  Novel Coronavirus, NAA (send-out to ref lab)     Status: None   Collection Time: 03/30/19 12:43 AM   Specimen: Nasopharyngeal Swab; Respiratory  Result Value Ref Range Status   SARS-CoV-2, NAA NOT DETECTED NOT DETECTED Final    Comment: (NOTE) This test was developed and its performance characteristics determined by [employer].  This test has not been FDA cleared or approved. This test has been authorized by FDA under an Emergency Use Authorization (EUA). This test is only authorized for the duration of time the declaration that circumstances  exist justifying the authorization of the emergency use of in vitro diagnostic tests for detection of SARS-CoV-2 virus and/or diagnosis of COVID-19 infection under section 564(b)(1) of the Act, 21 U.S.C. 465KPT-4(S)(5), unless the authorization is terminated or revoked sooner. When diagnostic testing is negative, the possibility of a false negative result should be considered in the context of a patient's recent exposures and the presence of clinical signs and symptoms consistent with COVID-19. An individual without symptoms of COVID-19 and who is not shedding SARS-CoV-2 virus would expect to have a negative (not detected) result in this assay. Performed  At: [facility] [address] [place], [place] [id number] Rush Farmer MD YF:[id number]    [place]  Final    Comment: Performed at [facility], [address] [place], [place] [address]  MRSA PCR Screening     Status: Abnormal   Collection Time: 03/30/19  1:31 AM   Specimen: Nasal Mucosa; Nasopharyngeal  Result Value Ref Range Status   MRSA by PCR POSITIVE (A) NEGATIVE Final    Comment:        The GeneXpert MRSA Assay (FDA approved for NASAL specimens only), is one component of a comprehensive MRSA colonization surveillance program. It is not intended to diagnose MRSA infection nor to guide or monitor treatment for MRSA infections. RESULT CALLED TO, READ BACK BY AND VERIFIED WITH: Berna Bue RN AT [phone] ON 03/30/2019 [facility] Performed at [facility], [address] [place], [place] [address]   CULTURE, BLOOD (ROUTINE X 2) w Reflex to ID Panel     Status: None (Preliminary result)   Collection Time: 03/31/19  9:33 AM   Specimen: BLOOD  Result Value  Ref Range Status   Specimen Description BLOOD BLOOD RIGHT HAND  Final   Special Requests   Final    BOTTLES DRAWN AEROBIC AND ANAEROBIC Blood Culture adequate volume   Culture   Final    NO GROWTH 3 DAYS Performed at [facility], [address] [place], [place] [address]    Report Status PENDING  Incomplete  CULTURE, BLOOD (ROUTINE X 2) w Reflex to ID Panel     Status: None (Preliminary result)   Collection Time: 03/31/19  9:47 AM   Specimen: BLOOD  Result Value Ref Range Status   Specimen Description BLOOD RIGHT ANTECUBITAL  Final   Special Requests   Final    BOTTLES DRAWN AEROBIC AND ANAEROBIC Blood Culture results may not be optimal due to an excessive volume of blood received in culture bottles   Culture   Final    NO GROWTH 3 DAYS Performed at [facility], [address] [place], [place] [address]    Report Status PENDING  Incomplete    Radiologic Imaging: US Renal  Result Date: 04/02/2019 CLINICAL DATA:  Renal failure, type II diabetes mellitus, hypertension, history bladder cancer EXAM: RENAL / URINARY TRACT ULTRASOUND COMPLETE COMPARISON:  CT abdomen and pelvis 03/25/2012 FINDINGS: Right Kidney: Renal measurements: 10.6 x 5.4 x 5.3 cm = volume: 157 mL. Cortical thinning and increased cortical echogenicity. Lobulated renal contours. Multiple renal cysts identified, most notably 2.4 x 2.5 x 2.7 cm diameter simple cyst and 2.1 x 2.0 x 2.4 cm diameter simple cyst at the upper pole of the RIGHT kidney. Significant hydronephrosis. No solid renal mass or shadowing calcification. Left Kidney: Renal measurements: 10.8 x 5.0 x 5.8 cm = volume: 164 mL. Cortical thinning and minimally increased cortical echogenicity. Significant hydronephrosis. Small cyst at inferior pole  11 x 11 x 12 mm. No solid mass or shadowing calcification. Bladder: Surgically absent, patient has urostomy IMPRESSION: Significant BILATERAL hydronephrosis, which could be due to obstruction or reflux  in a patient with prior bladder resection and urostomy. BILATERAL renal cysts. Electronically Signed   By: Lavonia Dana M.D.   On: 04/02/2019 10:35   I personally reviewed the above imaging and note bilateral hydronephrosis and multiple bilateral renal cysts.  Assessment & Plan:  Patient with ileal conduit urostomy, with positive urine culture and elevated creatinine on admission.  Creatinine downtrending, now approximating his baseline.  Bilateral hydronephrosis is known and has been monitored by [facility] for several years.  Positive urine culture and bilateral hydronephrosis are both expected in ileal conduit patients.  No indication for further intervention at this time.  -Follow-up with [facility]rology for continued surveillance of bilateral hydronephrosis  Thank you for involving me in this patient's care, please page with any further questions or concerns.  Debroah Loop, PA-C 04/03/2019 10:22 AM

## 2019-04-03 NOTE — Progress Notes (Signed)
PHARMACY CONSULT NOTE - FOLLOW UP  Pharmacy Consult for Electrolyte Monitoring and Replacement   Recent Labs: Potassium (mmol/L)  Date Value  04/03/2019 3.8  04/21/2012 4.8   Magnesium (mg/dL)  Date Value  04/01/2019 2.6 (H)   Calcium (mg/dL)  Date Value  04/03/2019 7.6 (L)   Calcium, Total (mg/dL)  Date Value  04/21/2012 9.3   Albumin (g/dL)  Date Value  03/30/2019 2.1 (L)  04/21/2012 3.4   Phosphorus (mg/dL)  Date Value  03/31/2019 3.0   Sodium (mmol/L)  Date Value  04/03/2019 133 (L)  04/21/2012 139     Assessment: 78 year old male admitted with concern for sepsis. Initially required vasopressors which have now been titrated off. Pharmacy consulted for electrolyte management.  Scr continuing to improve  4.03>3.57>2.92>2.76>2.6>2.45  Goal of Therapy:  Electrolytes WNL (K ~ 4, Mg ~ 2, phos WNL)  Plan:  K 3.8 - Sodium bicarb tablets likely contributing to hypokalemia.  No replenishment warranted at this time.  Last Mg 2.6 - no supplementation at this time.  Na 133 (improving) - patient is not currently on any fluids.  Receiving sodium bicarb 1300 mg TID. Will continue to monitor.  BMP with morning labs.  Lu Duffel, PharmD, BCPS Clinical Pharmacist 04/03/2019 7:21 AM

## 2019-04-04 LAB — BPAM RBC
Blood Product Expiration Date: 202009012359
ISSUE DATE / TIME: 202008281201
Unit Type and Rh: 600

## 2019-04-04 LAB — CBC
HCT: 27.1 % — ABNORMAL LOW (ref 39.0–52.0)
Hemoglobin: 8.8 g/dL — ABNORMAL LOW (ref 13.0–17.0)
MCH: 27.6 pg (ref 26.0–34.0)
MCHC: 32.5 g/dL (ref 30.0–36.0)
MCV: 85 fL (ref 80.0–100.0)
Platelets: 208 10*3/uL (ref 150–400)
RBC: 3.19 MIL/uL — ABNORMAL LOW (ref 4.22–5.81)
RDW: 15.9 % — ABNORMAL HIGH (ref 11.5–15.5)
WBC: 8.7 10*3/uL (ref 4.0–10.5)
nRBC: 0 % (ref 0.0–0.2)

## 2019-04-04 LAB — BASIC METABOLIC PANEL
Anion gap: 9 (ref 5–15)
BUN: 96 mg/dL — ABNORMAL HIGH (ref 8–23)
CO2: 21 mmol/L — ABNORMAL LOW (ref 22–32)
Calcium: 7.9 mg/dL — ABNORMAL LOW (ref 8.9–10.3)
Chloride: 107 mmol/L (ref 98–111)
Creatinine, Ser: 2.36 mg/dL — ABNORMAL HIGH (ref 0.61–1.24)
GFR calc Af Amer: 30 mL/min — ABNORMAL LOW (ref >60)
GFR calc non Af Amer: 26 mL/min — ABNORMAL LOW (ref >60)
Glucose, Bld: 165 mg/dL — ABNORMAL HIGH (ref 70–99)
Potassium: 3.3 mmol/L — ABNORMAL LOW (ref 3.5–5.1)
Sodium: 137 mmol/L (ref 135–145)

## 2019-04-04 LAB — TYPE AND SCREEN
ABO/RH(D): A POS
Antibody Screen: NEGATIVE
Unit division: 0

## 2019-04-04 LAB — GLUCOSE, CAPILLARY
Glucose-Capillary: 187 mg/dL — ABNORMAL HIGH (ref 70–99)
Glucose-Capillary: 399 mg/dL — ABNORMAL HIGH (ref 70–99)

## 2019-04-04 LAB — MAGNESIUM: Magnesium: 2.2 mg/dL (ref 1.7–2.4)

## 2019-04-04 MED ORDER — PREDNISONE 20 MG PO TABS: 40.0000 mg | ORAL_TABLET | Freq: Every day | ORAL | 0 refills | Status: AC

## 2019-04-04 MED ORDER — BLOOD GLUCOSE MONITOR KIT: PACK | 1 refills | Status: DC

## 2019-04-04 MED ORDER — POTASSIUM CHLORIDE CRYS ER 20 MEQ PO TBCR
20.0000 meq | EXTENDED_RELEASE_TABLET | Freq: Once | ORAL | Status: AC
  Administered 2019-04-04: 20 meq via ORAL
  Filled 2019-04-04: qty 1

## 2019-04-04 MED ORDER — IPRATROPIUM-ALBUTEROL 0.5-2.5 (3) MG/3ML IN SOLN: 3.0000 mL | Freq: Four times a day (QID) | RESPIRATORY_TRACT | 1 refills | Status: DC | PRN

## 2019-04-04 MED ORDER — GUAIFENESIN ER 600 MG PO TB12: 600.0000 mg | ORAL_TABLET | Freq: Two times a day (BID) | ORAL | 0 refills | Status: DC

## 2019-04-04 MED ORDER — METOPROLOL TARTRATE 25 MG PO TABS: 25.0000 mg | ORAL_TABLET | Freq: Two times a day (BID) | ORAL | 1 refills | Status: DC

## 2019-04-04 MED ORDER — DULERA 200-5 MCG/ACT IN AERO: 2.0000 | INHALATION_SPRAY | Freq: Two times a day (BID) | RESPIRATORY_TRACT | 2 refills | Status: DC

## 2019-04-04 NOTE — Progress Notes (Signed)
PHARMACY CONSULT NOTE - FOLLOW UP  Pharmacy Consult for Electrolyte Monitoring and Replacement   Recent Labs: Potassium (mmol/L)  Date Value  04/04/2019 3.3 (L)  04/21/2012 4.8   Magnesium (mg/dL)  Date Value  04/04/2019 2.2   Calcium (mg/dL)  Date Value  04/04/2019 7.9 (L)   Calcium, Total (mg/dL)  Date Value  04/21/2012 9.3   Albumin (g/dL)  Date Value  03/30/2019 2.1 (L)  04/21/2012 3.4   Phosphorus (mg/dL)  Date Value  03/31/2019 3.0   Sodium (mmol/L)  Date Value  04/04/2019 137  04/21/2012 139     Assessment: 78 year old male admitted with concern for sepsis. Initially required vasopressors which have now been titrated off. Pharmacy consulted for electrolyte management.  Scr continuing to improve  4.03>3.57>2.92>2.76>2.6>2.45  Goal of Therapy:  Electrolytes WNL (K ~ 4, Mg ~ 2, phos WNL)  Plan:  K 3.3  Mag 2.2  Scr 2.36  Na 137 Will  Order KCL 20 meq po x 1- conservative given renal fxn. Receiving sodium bicarb 1300 mg TID. Will continue to monitor.  BMP with morning labs.  Noralee Space, PharmD, BCPS Clinical Pharmacist 04/04/2019 9:01 AM

## 2019-04-04 NOTE — Progress Notes (Signed)
Discharge process complete with wife at bed side.Perscriptions for patient in his hands. Vital signs stable, PIV removed and personal items gathered and taken with patient. Patient denies pain. Wheel chair escort off unit by staff member. Patient is off unit. Care relinquished.

## 2019-04-04 NOTE — Discharge Summary (Signed)
Bryan Brewer at [place] NAME: Bryan Brewer    MR#:  [id number]  DATE OF BIRTH:  12/03/1940  DATE OF ADMISSION:  03/29/2019   ADMITTING PHYSICIAN: Lance Coon, MD  DATE OF DISCHARGE: 04/04/2019  PRIMARY CARE PHYSICIAN: Glendon Axe, MD   ADMISSION DIAGNOSIS:   Healthcare-associated pneumonia [J18.9] Hypotension, unspecified hypotension type [I95.9] Sepsis, due to unspecified organism, unspecified whether acute organ dysfunction present ([place]) [A41.9]  DISCHARGE DIAGNOSIS:   Principal Problem:   Severe sepsis with septic shock ([place]) Active Problems:   Diabetes ([place])   HTN (hypertension)   HLD (hyperlipidemia)   COPD (chronic obstructive pulmonary disease) ([place])   Acute on chronic renal failure ([facility])   HCAP (healthcare-associated pneumonia)   UTI (urinary tract infection)   Hyponatremia   Pressure injury of skin   SECONDARY DIAGNOSIS:   Past Medical History:  Diagnosis Date  . Bladder cancer ([place])   . COPD (chronic obstructive pulmonary disease) ([place])   . Hyperlipidemia   . Hypertension   . Kidney disease   . 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  Sepsis-admit presented as septic shock secondary to UTI -Received IV fluids and was on Levophed in the ICU -Urine cultures were only growing 50,000 colonies of Klebsiella.  Urology thinks this is colonization given his ileal conduit. -Finished his antibiotics.  Blood cultures are negative.  2.  COPD exacerbation and bronchitis-breathing is much improved. -On room air.  On IV steroids in the hospital.  Transition to oral steroids, inhalers, nebulizers and cough medications at discharge. -Breathing is close to baseline  3.  Acute on chronic  renal failure-CKD stage IV, creatinine varies between 2 and 4 based on his previous labs. -Appreciate urology consult and nephrology consult. -Renal ultrasound showing bilateral hydronephrosis and lateral renal cysts. -This is chronic according to urology given his prior bladder resection and urostomy and ileal conduit. -No further intervention.  Creatinine is stable.  Avoid nephrotoxins. -Continue sodium bicarb  4.  Diabetes mellitus-on glimepiride.  Monitor sugars at home.  5.  Hypertension-patient was actually hypotensive to low normal blood pressures in the hospital.  His hydralazine, Lasix, Imdur and spironolactone discontinued at discharge.  He is on low-dose metoprolol at discharge.  Ambulated well with physical therapy.  Will be discharged home with home health services -His wife was present in the hospital at bedside to discuss discharge planning  DISCHARGE CONDITIONS:   Guarded  CONSULTS OBTAINED:   Treatment Team:  Murlean Iba, MD Billey Co, MD  DRUG ALLERGIES:   No Known Allergies DISCHARGE MEDICATIONS:   Allergies as of 04/04/2019   No Known Allergies     Medication List    STOP taking these medications   furosemide 80 MG tablet Commonly known as: LASIX   hydrALAZINE 50 MG tablet Commonly known as: APRESOLINE   isosorbide mononitrate 60 MG 24 hr tablet Commonly known as: IMDUR   spironolactone 25 MG tablet Commonly known as: ALDACTONE     TAKE these medications   aspirin EC 81 MG tablet Take 81 mg by mouth daily.   atorvastatin 80 MG tablet Commonly known as: LIPITOR Take 80 mg by mouth at bedtime.   blood glucose meter kit and supplies Kit Dispense based on patient and insurance  preference. Use up to four times daily as directed. (FOR ICD-9 250.00, 250.01).   Dulera 200-5 MCG/ACT Aero Generic drug: mometasone-formoterol Inhale 2 puffs into the lungs 2 (two) times daily.   glimepiride 2 MG tablet Commonly known as: AMARYL Take 2  mg by mouth daily with breakfast.   guaiFENesin 600 MG 12 hr tablet Commonly known as: MUCINEX Take 1 tablet (600 mg total) by mouth 2 (two) times daily.   ipratropium-albuterol 0.5-2.5 (3) MG/3ML Soln Commonly known as: DUONEB Take 3 mLs by nebulization every 6 (six) hours as needed (wheezing, shortness of breath).   metoprolol tartrate 25 MG tablet Commonly known as: LOPRESSOR Take 1 tablet (25 mg total) by mouth 2 (two) times daily. What changed:   medication strength  how much to take   predniSONE 20 MG tablet Commonly known as: Deltasone Take 2 tablets (40 mg total) by mouth daily for 5 days. Start taking on: April 05, 2019   sodium bicarbonate 650 MG tablet Take 1,300 mg by mouth 3 (three) times daily.        DISCHARGE INSTRUCTIONS:   1.  PCP follow-up in 1 to 2 weeks 2.  Urology follow-up as per scheduled  DIET:   Cardiac diet  ACTIVITY:   Activity as tolerated  OXYGEN:   Home Oxygen: No.  Oxygen Delivery: room air  DISCHARGE LOCATION:   home   If you experience worsening of your admission symptoms, develop shortness of breath, life threatening emergency, suicidal or homicidal thoughts you must seek medical attention immediately by calling 911 or calling your MD immediately  if symptoms less severe.  You Must read complete instructions/literature along with all the possible adverse reactions/side effects for all the Medicines you take and that have been prescribed to you. Take any new Medicines after you have completely understood and accpet all the possible adverse reactions/side effects.   Please note  You were cared for by a hospitalist during your hospital stay. If you have any questions about your discharge medications or the care you received while you were in the hospital after you are discharged, you can call the unit and asked to speak with the hospitalist on call if the hospitalist that took care of you is not available. Once you are  discharged, your primary care physician will handle any further medical issues. Please note that NO REFILLS for any discharge medications will be authorized once you are discharged, as it is imperative that you return to your primary care physician (or establish a relationship with a primary care physician if you do not have one) for your aftercare needs so that they can reassess your need for medications and monitor your lab values.    On the day of Discharge:  VITAL SIGNS:   Blood pressure 117/72, pulse 100, temperature 98.7 F (37.1 C), temperature source Oral, resp. rate 18, height _0  (1.702 m), weight 71.6 kg, SpO2 96 %.  PHYSICAL EXAMINATION:    GENERAL:  78 y.o.-year-old patient lying in the bed with no acute distress.  EYES: Pupils equal, round, reactive to light and accommodation. No scleral icterus. Extraocular muscles intact.  HEENT: Head atraumatic, normocephalic. Oropharynx and nasopharynx clear.  NECK:  Supple, no jugular venous distention. No thyroid enlargement, no tenderness.  LUNGS: Moving air bilaterally, bibasilar crackles heard.  Occasional scattered wheezing.  No rhonchi or rails.  No use of accessory muscles of respiration.  CARDIOVASCULAR: S1, S2 normal. No murmurs, rubs, or gallops.  ABDOMEN: Soft, non-tender, non-distended. Bowel  sounds present. No organomegaly or mass.  EXTREMITIES: No pedal edema, cyanosis, or clubbing.  NEUROLOGIC: Cranial nerves II through XII are intact. Muscle strength 5/5 in all extremities. Sensation intact. Gait not checked.  Global weakness noted PSYCHIATRIC: The patient is alert and oriented x 3.  SKIN: No obvious rash, lesion, or ulcer.   DATA REVIEW:   CBC Recent Labs  Lab 04/04/19 0423  WBC 8.7  HGB 8.8*  HCT 27.1*  PLT 208    Chemistries  Recent Labs  Lab 03/30/19 0359  04/04/19 0423  NA 128*   < > 137  K 3.0*   < > 3.3*  CL 97*   < > 107  CO2 19*   < > 21*  GLUCOSE 93   < > 165*  BUN 78*   < > 96*   CREATININE 3.57*   < > 2.36*  CALCIUM 7.1*   < > 7.9*  MG 1.7   < > 2.2  AST 26  --   --   ALT 14  --   --   ALKPHOS 55  --   --   BILITOT 1.0  --   --    < > = values in this interval not displayed.     Microbiology Results  Results for orders placed or performed during the hospital encounter of 03/29/19  Urine Culture     Status: Abnormal   Collection Time: 03/29/19 10:16 PM   Specimen: Urine, Clean Catch  Result Value Ref Range Status   Specimen Description   Final    URINE, CLEAN CATCH Performed at [facility], [address] [place], [place] [address]    Special Requests   Final    NONE Performed at [facility], [address] [place], [place] [address]    Culture (A)  Final    50,000 COLONIES/mL KLEBSIELLA PNEUMONIAE Confirmed Extended Spectrum Beta-Lactamase Producer (ESBL).  In bloodstream infections from ESBL organisms, carbapenems are preferred over piperacillin/tazobactam. They are shown to have a lower risk of mortality. 20,000 COLONIES/mL PROVIDENCIA ALCALIFACIENS    Report Status 04/03/2019 FINAL  Final   Organism ID, Bacteria KLEBSIELLA PNEUMONIAE (A)  Final   Organism ID, Bacteria PROVIDENCIA ALCALIFACIENS (A)  Final      Susceptibility   Klebsiella pneumoniae - MIC*    AMPICILLIN >=32 RESISTANT Resistant     CEFAZOLIN >=64 RESISTANT Resistant     CEFTRIAXONE >=64 RESISTANT Resistant     CIPROFLOXACIN >=4 RESISTANT Resistant     GENTAMICIN >=16 RESISTANT Resistant     IMIPENEM <=0.25 SENSITIVE Sensitive     NITROFURANTOIN <=16 SENSITIVE Sensitive     TRIMETH/SULFA >=320 RESISTANT Resistant     AMPICILLIN/SULBACTAM >=32 RESISTANT Resistant     PIP/TAZO 32 INTERMEDIATE Intermediate     Extended ESBL POSITIVE Resistant     * 50,000 COLONIES/mL KLEBSIELLA PNEUMONIAE   Providencia alcalifaciens - MIC*    AMPICILLIN <=2 SENSITIVE Sensitive     CEFAZOLIN 16 SENSITIVE Sensitive     CEFTRIAXONE <=1 SENSITIVE Sensitive      CIPROFLOXACIN 2 INTERMEDIATE Intermediate     GENTAMICIN <=1 SENSITIVE Sensitive     IMIPENEM 0.5 SENSITIVE Sensitive     NITROFURANTOIN 128 RESISTANT Resistant     TRIMETH/SULFA <=20 SENSITIVE Sensitive     AMPICILLIN/SULBACTAM <=2 SENSITIVE Sensitive     PIP/TAZO <=4 SENSITIVE Sensitive     * 20,000 COLONIES/mL PROVIDENCIA ALCALIFACIENS  Novel Coronavirus, NAA (send-out to ref lab)  Status: None   Collection Time: 03/30/19 12:43 AM   Specimen: Nasopharyngeal Swab; Respiratory  Result Value Ref Range Status   SARS-CoV-2, NAA NOT DETECTED NOT DETECTED Final    Comment: (NOTE) This test was developed and its performance characteristics determined by [employer]. This test has not been FDA cleared or approved. This test has been authorized by FDA under an Emergency Use Authorization (EUA). This test is only authorized for the duration of time the declaration that circumstances exist justifying the authorization of the emergency use of in vitro diagnostic tests for detection of SARS-CoV-2 virus and/or diagnosis of COVID-19 infection under section 564(b)(1) of the Act, 21 U.S.C. 704UGQ-9(V)(6), unless the authorization is terminated or revoked sooner. When diagnostic testing is negative, the possibility of a false negative result should be considered in the context of a patient's recent exposures and the presence of clinical signs and symptoms consistent with COVID-19. An individual without symptoms of COVID-19 and who is not shedding SARS-CoV-2 virus would expect to have a negative (not detected) result in this assay. Performed  At: [facility] [address] [place], [place] [id number] Rush Farmer MD CM:[id number]    [place]  Final    Comment: Performed at [facility], [address] [place], [place] [address]  MRSA PCR Screening     Status: Abnormal   Collection Time: 03/30/19  1:31 AM   Specimen: Nasal Mucosa;  Nasopharyngeal  Result Value Ref Range Status   MRSA by PCR POSITIVE (A) NEGATIVE Final    Comment:        The GeneXpert MRSA Assay (FDA approved for NASAL specimens only), is one component of a comprehensive MRSA colonization surveillance program. It is not intended to diagnose MRSA infection nor to guide or monitor treatment for MRSA infections. RESULT CALLED TO, READ BACK BY AND VERIFIED WITH: Berna Bue RN AT [phone] ON 03/30/2019 [facility] Performed at [facility], [address] [place], [place] [address]   CULTURE, BLOOD (ROUTINE X 2) w Reflex to ID Panel     Status: None (Preliminary result)   Collection Time: 03/31/19  9:33 AM   Specimen: BLOOD  Result Value Ref Range Status   Specimen Description BLOOD BLOOD RIGHT HAND  Final   Special Requests   Final    BOTTLES DRAWN AEROBIC AND ANAEROBIC Blood Culture adequate volume   Culture   Final    NO GROWTH 4 DAYS Performed at [facility], [address] [place], [place] [address]    Report Status PENDING  Incomplete  CULTURE, BLOOD (ROUTINE X 2) w Reflex to ID Panel     Status: None (Preliminary result)   Collection Time: 03/31/19  9:47 AM   Specimen: BLOOD  Result Value Ref Range Status   Specimen Description BLOOD RIGHT ANTECUBITAL  Final   Special Requests   Final    BOTTLES DRAWN AEROBIC AND ANAEROBIC Blood Culture results may not be optimal due to an excessive volume of blood received in culture bottles   Culture   Final    NO GROWTH 4 DAYS Performed at [facility], [address], [place], [place] [address]    Report Status PENDING  Incomplete    RADIOLOGY:  No results found.   Management plans discussed with the patient, family and they are in agreement.  CODE STATUS:     Code Status Orders  (From admission, onward)         Start  Ordered   03/30/19 0124  Full code  Continuous     03/30/19 0124        Code Status History    Date Active Date Inactive Code  Status Order ID Comments User Context   02/14/2019 2330 02/16/2019 1909 Full Code [id number]  Henreitta Leber, MD Inpatient   10/30/2017 0203 11/01/2017 2259 Full Code [id number]  Lance Coon, MD Inpatient   Advance Care Planning Activity      TOTAL TIME TAKING CARE OF THIS PATIENT: 38 minutes.    Gladstone Lighter M.D on 04/04/2019 at 1:54 PM  Between 7am to 6pm - Pager - [phone]  After 6pm go to [email or web address] - Proofreader  [facility]  Office  [phone]  CC: Primary care physician; Glendon Axe, MD   Note: This dictation was prepared with Dragon dictation along with smaller phrase technology. Any transcriptional errors that result from this process are unintentional.

## 2019-04-04 NOTE — Plan of Care (Signed)

## 2019-04-05 LAB — CULTURE, BLOOD (ROUTINE X 2)
Culture: NO GROWTH
Culture: NO GROWTH
Special Requests: ADEQUATE

## 2019-04-06 LAB — CYTOLOGY - NON PAP

## 2019-04-07 DIAGNOSIS — A0472 Enterocolitis due to Clostridium difficile, not specified as recurrent: Secondary | ICD-10-CM

## 2019-04-07 HISTORY — DX: Enterocolitis due to Clostridium difficile, not specified as recurrent: A04.72

## 2019-04-20 ENCOUNTER — Other Ambulatory Visit: Payer: Self-pay

## 2019-04-20 ENCOUNTER — Encounter: Payer: Self-pay | Admitting: Intensive Care

## 2019-04-20 ENCOUNTER — Inpatient Hospital Stay
Admission: EM | Admit: 2019-04-20 | Discharge: 2019-04-25 | DRG: 378 | Disposition: A | Discharge status: Home / Self Care | Payer: Medicare Other | Attending: Internal Medicine | Admitting: Internal Medicine

## 2019-04-20 ENCOUNTER — Emergency Department: Payer: Medicare Other

## 2019-04-20 ENCOUNTER — Inpatient Hospital Stay: Payer: Medicare Other

## 2019-04-20 DIAGNOSIS — D62 Acute posthemorrhagic anemia: Secondary | ICD-10-CM | POA: Diagnosis present

## 2019-04-20 DIAGNOSIS — D638 Anemia in other chronic diseases classified elsewhere: Secondary | ICD-10-CM | POA: Diagnosis present

## 2019-04-20 DIAGNOSIS — Z823 Family history of stroke: Secondary | ICD-10-CM | POA: Diagnosis not present

## 2019-04-20 DIAGNOSIS — I959 Hypotension, unspecified: Secondary | ICD-10-CM | POA: Diagnosis present

## 2019-04-20 DIAGNOSIS — E1165 Type 2 diabetes mellitus with hyperglycemia: Secondary | ICD-10-CM | POA: Diagnosis present

## 2019-04-20 DIAGNOSIS — N179 Acute kidney failure, unspecified: Secondary | ICD-10-CM | POA: Diagnosis not present

## 2019-04-20 DIAGNOSIS — Y92234 Operating room of hospital as the place of occurrence of the external cause: Secondary | ICD-10-CM | POA: Diagnosis not present

## 2019-04-20 DIAGNOSIS — E1122 Type 2 diabetes mellitus with diabetic chronic kidney disease: Secondary | ICD-10-CM | POA: Diagnosis present

## 2019-04-20 DIAGNOSIS — Z87891 Personal history of nicotine dependence: Secondary | ICD-10-CM

## 2019-04-20 DIAGNOSIS — K435 Parastomal hernia without obstruction or  gangrene: Secondary | ICD-10-CM | POA: Diagnosis present

## 2019-04-20 DIAGNOSIS — Z7982 Long term (current) use of aspirin: Secondary | ICD-10-CM

## 2019-04-20 DIAGNOSIS — N184 Chronic kidney disease, stage 4 (severe): Secondary | ICD-10-CM | POA: Diagnosis present

## 2019-04-20 DIAGNOSIS — I252 Old myocardial infarction: Secondary | ICD-10-CM

## 2019-04-20 DIAGNOSIS — E785 Hyperlipidemia, unspecified: Secondary | ICD-10-CM | POA: Diagnosis present

## 2019-04-20 DIAGNOSIS — Z932 Ileostomy status: Secondary | ICD-10-CM

## 2019-04-20 DIAGNOSIS — Z20828 Contact with and (suspected) exposure to other viral communicable diseases: Secondary | ICD-10-CM | POA: Diagnosis present

## 2019-04-20 DIAGNOSIS — N136 Pyonephrosis: Secondary | ICD-10-CM | POA: Diagnosis present

## 2019-04-20 DIAGNOSIS — E876 Hypokalemia: Secondary | ICD-10-CM | POA: Diagnosis present

## 2019-04-20 DIAGNOSIS — K922 Gastrointestinal hemorrhage, unspecified: Secondary | ICD-10-CM | POA: Diagnosis not present

## 2019-04-20 DIAGNOSIS — Z79899 Other long term (current) drug therapy: Secondary | ICD-10-CM

## 2019-04-20 DIAGNOSIS — Z8551 Personal history of malignant neoplasm of bladder: Secondary | ICD-10-CM | POA: Diagnosis not present

## 2019-04-20 DIAGNOSIS — I129 Hypertensive chronic kidney disease with stage 1 through stage 4 chronic kidney disease, or unspecified chronic kidney disease: Secondary | ICD-10-CM | POA: Diagnosis present

## 2019-04-20 DIAGNOSIS — K3182 Dieulafoy lesion (hemorrhagic) of stomach and duodenum: Principal | ICD-10-CM | POA: Diagnosis present

## 2019-04-20 DIAGNOSIS — R0602 Shortness of breath: Secondary | ICD-10-CM | POA: Diagnosis present

## 2019-04-20 DIAGNOSIS — J449 Chronic obstructive pulmonary disease, unspecified: Secondary | ICD-10-CM | POA: Diagnosis present

## 2019-04-20 DIAGNOSIS — K920 Hematemesis: Secondary | ICD-10-CM | POA: Diagnosis present

## 2019-04-20 DIAGNOSIS — R000 Tachycardia, unspecified: Secondary | ICD-10-CM

## 2019-04-20 DIAGNOSIS — R197 Diarrhea, unspecified: Secondary | ICD-10-CM | POA: Diagnosis not present

## 2019-04-20 DIAGNOSIS — D649 Anemia, unspecified: Secondary | ICD-10-CM

## 2019-04-20 DIAGNOSIS — Z951 Presence of aortocoronary bypass graft: Secondary | ICD-10-CM

## 2019-04-20 DIAGNOSIS — T445X5A Adverse effect of predominantly beta-adrenoreceptor agonists, initial encounter: Secondary | ICD-10-CM | POA: Diagnosis not present

## 2019-04-20 DIAGNOSIS — K921 Melena: Secondary | ICD-10-CM | POA: Diagnosis not present

## 2019-04-20 DIAGNOSIS — Z8249 Family history of ischemic heart disease and other diseases of the circulatory system: Secondary | ICD-10-CM

## 2019-04-20 LAB — BASIC METABOLIC PANEL
Anion gap: 20 — ABNORMAL HIGH (ref 5–15)
BUN: 111 mg/dL — ABNORMAL HIGH (ref 8–23)
CO2: 18 mmol/L — ABNORMAL LOW (ref 22–32)
Calcium: 7.6 mg/dL — ABNORMAL LOW (ref 8.9–10.3)
Chloride: 93 mmol/L — ABNORMAL LOW (ref 98–111)
Creatinine, Ser: 2.96 mg/dL — ABNORMAL HIGH (ref 0.61–1.24)
GFR calc Af Amer: 23 mL/min — ABNORMAL LOW (ref >60)
GFR calc non Af Amer: 19 mL/min — ABNORMAL LOW (ref >60)
Glucose, Bld: 222 mg/dL — ABNORMAL HIGH (ref 70–99)
Potassium: 3.8 mmol/L (ref 3.5–5.1)
Sodium: 131 mmol/L — ABNORMAL LOW (ref 135–145)

## 2019-04-20 LAB — CBC
HCT: 17 % — ABNORMAL LOW (ref 39.0–52.0)
Hemoglobin: 5.5 g/dL — ABNORMAL LOW (ref 13.0–17.0)
MCH: 28.5 pg (ref 26.0–34.0)
MCHC: 32.4 g/dL (ref 30.0–36.0)
MCV: 88.1 fL (ref 80.0–100.0)
Platelets: 190 10*3/uL (ref 150–400)
RBC: 1.93 MIL/uL — ABNORMAL LOW (ref 4.22–5.81)
RDW: 16.9 % — ABNORMAL HIGH (ref 11.5–15.5)
WBC: 10.2 10*3/uL (ref 4.0–10.5)
nRBC: 0 % (ref 0.0–0.2)

## 2019-04-20 LAB — PROTIME-INR
INR: 1.3 — ABNORMAL HIGH (ref 0.8–1.2)
Prothrombin Time: 16.2 seconds — ABNORMAL HIGH (ref 11.4–15.2)

## 2019-04-20 LAB — TROPONIN I (HIGH SENSITIVITY)
Troponin I (High Sensitivity): 9 ng/L (ref <18)
Troponin I (High Sensitivity): 11 ng/L (ref <18)

## 2019-04-20 LAB — SARS CORONAVIRUS 2 BY RT PCR (HOSPITAL ORDER, PERFORMED IN ~~LOC~~ HOSPITAL LAB): SARS Coronavirus 2: NEGATIVE

## 2019-04-20 LAB — PREPARE RBC (CROSSMATCH)

## 2019-04-20 LAB — GLUCOSE, CAPILLARY: Glucose-Capillary: 208 mg/dL — ABNORMAL HIGH (ref 70–99)

## 2019-04-20 MED ORDER — CEPHALEXIN 500 MG PO CAPS: 500.0000 mg | ORAL_CAPSULE | Freq: Three times a day (TID) | ORAL | Status: DC

## 2019-04-20 MED ORDER — ONDANSETRON HCL 4 MG PO TABS: 4.0000 mg | ORAL_TABLET | Freq: Four times a day (QID) | ORAL | Status: DC | PRN

## 2019-04-20 MED ORDER — HYDROCODONE-ACETAMINOPHEN 5-325 MG PO TABS
1.0000 | ORAL_TABLET | ORAL | Status: DC | PRN
  Administered 2019-04-20: 1 via ORAL
  Filled 2019-04-20: qty 1

## 2019-04-20 MED ORDER — ATORVASTATIN CALCIUM 80 MG PO TABS
80.0000 mg | ORAL_TABLET | Freq: Every day | ORAL | Status: DC
  Administered 2019-04-20 – 2019-04-24 (×4): 80 mg via ORAL
  Filled 2019-04-20: qty 1
  Filled 2019-04-20: qty 4
  Filled 2019-04-20 (×2): qty 1

## 2019-04-20 MED ORDER — MOMETASONE FURO-FORMOTEROL FUM 200-5 MCG/ACT IN AERO
2.0000 | INHALATION_SPRAY | Freq: Two times a day (BID) | RESPIRATORY_TRACT | Status: DC
  Administered 2019-04-20 – 2019-04-25 (×10): 2 via RESPIRATORY_TRACT
  Filled 2019-04-20: qty 8.8

## 2019-04-20 MED ORDER — INSULIN ASPART 100 UNIT/ML ~~LOC~~ SOLN
0.0000 [IU] | Freq: Three times a day (TID) | SUBCUTANEOUS | Status: DC
  Administered 2019-04-22 – 2019-04-23 (×2): 1 [IU] via SUBCUTANEOUS
  Administered 2019-04-23: 12:00:00 3 [IU] via SUBCUTANEOUS
  Administered 2019-04-23: 17:00:00 2 [IU] via SUBCUTANEOUS
  Administered 2019-04-24: 1 [IU] via SUBCUTANEOUS
  Administered 2019-04-24: 3 [IU] via SUBCUTANEOUS
  Filled 2019-04-20 (×7): qty 1

## 2019-04-20 MED ORDER — CEPHALEXIN 250 MG PO CAPS
250.0000 mg | ORAL_CAPSULE | Freq: Three times a day (TID) | ORAL | Status: DC
  Administered 2019-04-20 – 2019-04-25 (×11): 250 mg via ORAL
  Filled 2019-04-20 (×16): qty 1

## 2019-04-20 MED ORDER — ONDANSETRON HCL 4 MG/2ML IJ SOLN: 4.0000 mg | Freq: Four times a day (QID) | INTRAMUSCULAR | Status: DC | PRN

## 2019-04-20 MED ORDER — SODIUM CHLORIDE 0.9 % IV SOLN
INTRAVENOUS | Status: DC
  Administered 2019-04-20: 21:00:00 via INTRAVENOUS

## 2019-04-20 MED ORDER — ACETAMINOPHEN 325 MG PO TABS: 650.0000 mg | ORAL_TABLET | Freq: Four times a day (QID) | ORAL | Status: DC | PRN

## 2019-04-20 MED ORDER — ACETAMINOPHEN 650 MG RE SUPP: 650.0000 mg | Freq: Four times a day (QID) | RECTAL | Status: DC | PRN

## 2019-04-20 MED ORDER — SODIUM CHLORIDE 0.9 % IV SOLN
10.0000 mL/h | Freq: Once | INTRAVENOUS | Status: AC
  Administered 2019-04-21: 11:00:00 via INTRAVENOUS

## 2019-04-20 MED ORDER — TECHNETIUM TC 99M-LABELED RED BLOOD CELLS IV KIT
25.0000 | PACK | Freq: Once | INTRAVENOUS | Status: AC | PRN
  Administered 2019-04-20: 16:00:00 23.72 via INTRAVENOUS

## 2019-04-20 MED ORDER — PANTOPRAZOLE SODIUM 40 MG IV SOLR
40.0000 mg | Freq: Once | INTRAVENOUS | Status: AC
  Administered 2019-04-20: 15:00:00 40 mg via INTRAVENOUS
  Filled 2019-04-20: qty 40

## 2019-04-20 MED ORDER — PANTOPRAZOLE SODIUM 40 MG IV SOLR
40.0000 mg | Freq: Two times a day (BID) | INTRAVENOUS | Status: DC
  Administered 2019-04-20 – 2019-04-25 (×10): 40 mg via INTRAVENOUS
  Filled 2019-04-20 (×11): qty 40

## 2019-04-20 MED ORDER — METOPROLOL TARTRATE 25 MG PO TABS
12.5000 mg | ORAL_TABLET | Freq: Two times a day (BID) | ORAL | Status: DC
  Administered 2019-04-20: 12.5 mg via ORAL
  Filled 2019-04-20 (×2): qty 1

## 2019-04-20 MED ORDER — SODIUM BICARBONATE 650 MG PO TABS
1300.0000 mg | ORAL_TABLET | Freq: Three times a day (TID) | ORAL | Status: DC
  Administered 2019-04-20 – 2019-04-25 (×12): 1300 mg via ORAL
  Filled 2019-04-20 (×12): qty 2

## 2019-04-20 MED ORDER — POLYETHYLENE GLYCOL 3350 17 G PO PACK: 17.0000 g | PACK | Freq: Every day | ORAL | Status: DC | PRN

## 2019-04-20 NOTE — ED Notes (Signed)
PT transported to nuclear medicine

## 2019-04-20 NOTE — Progress Notes (Signed)
Family Meeting Note  Advance Directive:no  Today a meeting took place with the Patient.and spouse  The following clinical team members were present during this meeting:MD and RN  The following were discussed:Patient's diagnosis: Acute blood loss anemia status post 2 unit PRBCs in the emergency room and GI bleed, Patient's progosis: Unable to determine and Goals for treatment: Full Code  Additional follow-up to be provided: chaplain to create AD  Time spent during discussion:16 minutes   Bettey Costa, MD

## 2019-04-20 NOTE — ED Notes (Signed)
ED TO INPATIENT HANDOFF REPORT  ED Nurse Name and Phone #: Joelene Millin [phone]  S Name/Age/Gender Bryan Brewer 78 y.o. male Room/Bed: ED07A/ED07A  Code Status   Code Status: Prior  Home/SNF/Other Home Patient oriented to: self, place, time and situation Is this baseline? Yes   Triage Complete: Triage complete  Chief Complaint abd pain  Triage Note Arrived by EMS from home with c/o an episode of diarrhea today with blood in stools and 1 episode of emesis with blood. EMS Blood sugar 311. A&O x4 pt has two abdominal hernias and a urostomy   Allergies No Known Allergies  Level of Care/Admitting Diagnosis ED Disposition    ED Disposition Condition [facility] Area: [place] [[id number]]  Level of Care: Med-Surg [16]  Covid Evaluation: N/A  Diagnosis: GIB (gastrointestinal bleeding) [[id number]]  Admitting Physician: Bettey Costa [[id number]]  Attending Physician: MODY, Ulice Bold [[id number]]  Estimated length of stay: 3 - 4 days  Certification:: I certify this patient will need inpatient services for at least 2 midnights  PT Class (Do Not Modify): Inpatient [101]  PT Acc Code (Do Not Modify): Private [1]       B Medical/Surgery History Past Medical History:  Diagnosis Date  . Bladder cancer ([place])   . COPD (chronic obstructive pulmonary disease) ([place])   . Hyperlipidemia   . Hypertension   . Kidney disease   . Type 2 diabetes mellitus ([place])    Past Surgical History:  Procedure Laterality Date  . LEFT HEART CATH AND CORONARY ANGIOGRAPHY N/A 10/31/2017   Procedure: LEFT HEART CATH AND CORONARY ANGIOGRAPHY;  Surgeon: Yolonda Kida, MD;  Location: [facility];  Service: Cardiovascular;  Laterality: N/A;  . REVISION UROSTOMY CUTANEOUS       A IV Location/Drains/Wounds Patient Lines/Drains/Airways Status   Active Line/Drains/Airways    Name:   Placement date:   Placement time:   Site:   Days:   Peripheral IV 04/20/19 Left  Antecubital   04/20/19    1350    Antecubital   less than 1   Peripheral IV 04/20/19 Right Antecubital   04/20/19    1405    Antecubital   less than 1   Urostomy Ureterostomy left LLQ   03/30/19    0130    LLQ   21          Intake/Output Last 24 hours No intake or output data in the 24 hours ending 04/20/19 1926  Labs/Imaging Results for orders placed or performed during the hospital encounter of 04/20/19 (from the past 48 hour(s))  Basic metabolic panel     Status: Abnormal   Collection Time: 04/20/19  1:54 PM  Result Value Ref Range   Sodium 131 (L) 135 - 145 mmol/L   Potassium 3.8 3.5 - 5.1 mmol/L   Chloride 93 (L) 98 - 111 mmol/L   CO2 18 (L) 22 - 32 mmol/L   Glucose, Bld 222 (H) 70 - 99 mg/dL   BUN 111 (H) 8 - 23 mg/dL    Comment: RESULT CONFIRMED BY MANUAL DILUTION KMP/DAS   Creatinine, Ser 2.96 (H) 0.61 - 1.24 mg/dL   Calcium 7.6 (L) 8.9 - 10.3 mg/dL   GFR calc non Af Amer 19 (L) >60 mL/min   GFR calc Af Amer 23 (L) >60 mL/min   Anion gap 20 (H) 5 - 15    Comment: Performed at [facility], [address], [place], [place] [address]  CBC  Status: Abnormal   Collection Time: 04/20/19  1:54 PM  Result Value Ref Range   WBC 10.2 4.0 - 10.5 K/uL   RBC 1.93 (L) 4.22 - 5.81 MIL/uL   Hemoglobin 5.5 (L) 13.0 - 17.0 g/dL   HCT 17.0 (L) 39.0 - 52.0 %   MCV 88.1 80.0 - 100.0 fL   MCH 28.5 26.0 - 34.0 pg   MCHC 32.4 30.0 - 36.0 g/dL   RDW 16.9 (H) 11.5 - 15.5 %   Platelets 190 150 - 400 K/uL   nRBC 0.0 0.0 - 0.2 %    Comment: Performed at [facility], [place], [place] [address]  Troponin I (High Sensitivity)     Status: None   Collection Time: 04/20/19  1:54 PM  Result Value Ref Range   Troponin I (High Sensitivity) 9 <18 ng/L    Comment: (NOTE) Elevated high sensitivity troponin I (hsTnI) values and significant  changes across serial measurements may suggest ACS but many other  chronic and acute conditions are known to elevate  hsTnI results.  Refer to the "Links" section for chest pain algorithms and additional  guidance. Performed at [facility], [address] [place], [place] [address]   Protime-INR     Status: Abnormal   Collection Time: 04/20/19  1:54 PM  Result Value Ref Range   Prothrombin Time 16.2 (H) 11.4 - 15.2 seconds   INR 1.3 (H) 0.8 - 1.2    Comment: (NOTE) INR goal varies based on device and disease states. Performed at [facility], [address] [place], [place] [address]   Type and screen [place]     Status: None (Preliminary result)   Collection Time: 04/20/19  1:55 PM  Result Value Ref Range   ABO/RH(D) A POS    Antibody Screen NEG    Sample Expiration 04/23/2019,2359    Unit Number [id number]    Blood Component Type RED CELLS,LR    Unit division 00    Status of Unit ISSUED    Unit tag comment EMERGENCY RELEASE    Transfusion Status OK TO TRANSFUSE    Crossmatch Result COMPATIBLE    Unit Number [id number]    Blood Component Type RBC LR PHER1    Unit division 00    Status of Unit ISSUED    Unit tag comment EMERGENCY RELEASE    Transfusion Status OK TO TRANSFUSE    Crossmatch Result COMPATIBLE   Prepare RBC     Status: None   Collection Time: 04/20/19  2:16 PM  Result Value Ref Range   Order Confirmation      ORDER PROCESSED BY BLOOD BANK Performed at [facility], [address], [place],  [address]   SARS Coronavirus 2 [facility] order, Performed in [facility] hospital lab) Nasopharyngeal Nasopharyngeal Swab     Status: None   Collection Time: 04/20/19  2:32 PM   Specimen: Nasopharyngeal Swab  Result Value Ref Range   SARS Coronavirus 2 NEGATIVE NEGATIVE    Comment: (NOTE) If result is NEGATIVE SARS-CoV-2 target nucleic acids are NOT DETECTED. The SARS-CoV-2 RNA is generally detectable in upper and lower  respiratory specimens during the acute phase of infection. The lowest  concentration of  SARS-CoV-2 viral copies this assay can detect is 250  copies / mL. A negative result does not preclude SARS-CoV-2 infection  and should not be used as the sole basis for treatment or other  patient management decisions.  A negative result may occur with  improper specimen collection / handling, submission of specimen other  than nasopharyngeal swab, presence of viral mutation(s) within the  areas targeted by this assay, and inadequate number of viral copies  (<250 copies / mL). A negative result must be combined with clinical  observations, patient history, and epidemiological information. If result is POSITIVE SARS-CoV-2 target nucleic acids are DETECTED. The SARS-CoV-2 RNA is generally detectable in upper and lower  respiratory specimens dur ing the acute phase of infection.  Positive  results are indicative of active infection with SARS-CoV-2.  Clinical  correlation with patient history and other diagnostic information is  necessary to determine patient infection status.  Positive results do  not rule out bacterial infection or co-infection with other viruses. If result is PRESUMPTIVE POSTIVE SARS-CoV-2 nucleic acids MAY BE PRESENT.   A presumptive positive result was obtained on the submitted specimen  and confirmed on repeat testing.  While 2019 novel coronavirus  (SARS-CoV-2) nucleic acids may be present in the submitted sample  additional confirmatory testing may be necessary for epidemiological  and / or clinical management purposes  to differentiate between  SARS-CoV-2 and other Sarbecovirus currently known to infect humans.  If clinically indicated additional testing with an alternate test  methodology [phone]) is advised. The SARS-CoV-2 RNA is generally  detectable in upper and lower respiratory sp ecimens during the acute  phase of infection. The expected result is Negative. Fact Sheet for Patients:  StrictlyIdeas.no Fact Sheet for Healthcare  Providers: BankingDealers.co.za This test is not yet approved or cleared by the [place] FDA and has been authorized for detection and/or diagnosis of SARS-CoV-2 by FDA under an Emergency Use Authorization (EUA).  This EUA will remain in effect (meaning this test can be used) for the duration of the COVID-19 declaration under Section 564(b)(1) of the Act, 21 U.S.C. section 360bbb-3(b)(1), unless the authorization is terminated or revoked sooner. Performed at [facility], [address] [place], [place] [address]    Dg Chest Port 1 View  Result Date: 04/20/2019 CLINICAL DATA:  Shortness of breath and hypotension. Blood in stool today. EXAM: PORTABLE CHEST 1 VIEW COMPARISON:  None. FINDINGS: Small left pleural effusion and mild basilar atelectasis are seen. Lungs otherwise clear. Lungs are emphysematous. No pneumothorax. Heart size is upper normal. The patient is status post CABG. Aortic atherosclerosis noted. IMPRESSION: Small left pleural effusion and mild basilar atelectasis. Emphysema. Atherosclerosis. Electronically Signed   By: Inge Rise M.D.   On: 04/20/2019 14:07    Pending Labs [employer] (From admission, onward)    Start     Ordered   Signed and Occupational hygienist morning,   R     Signed and Held   Signed and Held  CBC  Tomorrow morning,   R     Signed and Held   Signed and Held  Hemoglobin A1c  Once,   R    Comments: To assess prior glycemic control    Signed and Held          Vitals/Pain Today's Vitals   04/20/19 1450 04/20/19 1500 04/20/19 1506 04/20/19 1530  BP: (!) 112/51 (!) 104/48 (!) 104/48 (!) 103/52  Pulse: 87 81 80 82  Resp: (!) 21 19 (!) 21 (!) 21  Temp:   97.7 F (36.5 C)   TempSrc:   Oral   SpO2: 100% 100% 100% 100%  Weight:      Height:  PainSc:        Isolation Precautions Contact precautions  Medications Medications  0.9 %  sodium chloride infusion (has no  administration in time range)  cephALEXin (KEFLEX) capsule 250 mg (has no administration in time range)  pantoprazole (PROTONIX) injection 40 mg (40 mg Intravenous Given 04/20/19 1445)  technetium labeled red blood cells (ULTRATAG) injection kit 25 millicurie (95.97 millicuries Intravenous Contrast Given 04/20/19 1616)    Mobility walks Low fall risk   Focused Assessments GI Bleed   R Recommendations: See Admitting Provider Note  Report given to:   Additional Notes:

## 2019-04-20 NOTE — ED Triage Notes (Addendum)
Arrived by EMS from home with c/o an episode of diarrhea today with blood in stools and 1 episode of emesis with blood. EMS Blood sugar 311. A&O x4 pt has two abdominal hernias and a urostomy

## 2019-04-20 NOTE — ED Provider Notes (Signed)
[facility] Emergency Department Provider Note       Time seen: ----------------------------------------- 2:07 PM on 04/20/2019 -----------------------------------------   I have reviewed the triage vital signs and the nursing notes.  HISTORY   Chief Complaint Blood In Stools    HPI Bryan Brewer is a 78 y.o. male with a history of bladder cancer, COPD, hyperlipidemia, hypertension, kidney disease, diabetes who presents to the ED for diarrhea with bloody stools and one episode of vomiting with blood.  EMS reports blood sugar was elevated.  He has 2 abdominal hernias and a urostomy.  Pain is 9 out of 10 in his back.  Past Medical History:  Diagnosis Date  . Bladder cancer ([place])   . COPD (chronic obstructive pulmonary disease) ([place])   . Hyperlipidemia   . Hypertension   . Kidney disease   . Type 2 diabetes mellitus [facility])     Patient Active Problem List   Diagnosis Date Noted  . Pressure injury of skin 03/31/2019  . HCAP (healthcare-associated pneumonia) 03/29/2019  . UTI (urinary tract infection) 03/29/2019  . Severe sepsis with septic shock ([place]) 03/29/2019  . Hyponatremia 03/29/2019  . Acute on chronic renal failure ([place]) 02/14/2019  . NSTEMI (non-ST elevated myocardial infarction) ([place]) 10/30/2017  . Diabetes ([place]) 10/30/2017  . CKD (chronic kidney disease), stage IV ([place]) 10/30/2017  . HTN (hypertension) 10/30/2017  . HLD (hyperlipidemia) 10/30/2017  . COPD (chronic obstructive pulmonary disease) ([place]) 10/30/2017    Past Surgical History:  Procedure Laterality Date  . LEFT HEART CATH AND CORONARY ANGIOGRAPHY N/A 10/31/2017   Procedure: LEFT HEART CATH AND CORONARY ANGIOGRAPHY;  Surgeon: Yolonda Kida, MD;  Location: [facility];  Service: Cardiovascular;  Laterality: N/A;  . REVISION UROSTOMY CUTANEOUS      Allergies Patient has no known allergies.  Social History Social History   Tobacco Use  . Smoking status:  Former Smoker    Packs/day: 1.50    Years: 35.00    Pack years: 52.50    Types: Cigarettes    Quit date: 2013    Years since quitting: 7.7  . Smokeless tobacco: Never Used  Substance Use Topics  . Alcohol use: No    Frequency: Never  . Drug use: Never   Review of Systems Constitutional: Negative for fever. Cardiovascular: Negative for chest pain. Respiratory: Negative for shortness of breath. Gastrointestinal: Positive for abdominal pain, rectal bleeding, hematemesis Musculoskeletal: Positive for back pain Skin: Negative for rash. Neurological: Negative for headaches, focal weakness or numbness.  All systems negative/normal/unremarkable except as stated in the HPI  ____________________________________________   PHYSICAL EXAM:  VITAL SIGNS: ED Triage Vitals  Enc Vitals Group     BP 04/20/19 1328 (!) 110/97     Pulse Rate 04/20/19 1328 95     Resp 04/20/19 1328 18     Temp 04/20/19 1328 97.6 F (36.4 C)     Temp Source 04/20/19 1328 Oral     SpO2 04/20/19 1328 100 %     Weight 04/20/19 1329 145 lb (65.8 kg)     Height 04/20/19 1329 5\' 7"  (1.702 m)     Head Circumference --      Peak Flow --      Pain Score 04/20/19 1329 9     Pain Loc --      Pain Edu? --      Excl. in [place]? --    Constitutional: Alert and oriented.  Chronically ill-appearing, mild distress Eyes: Conjunctivae  are normal. Normal extraocular movements. ENT      Head: Normocephalic and atraumatic.      Nose: No congestion/rhinnorhea.      Mouth/Throat: Mucous membranes are moist.      Neck: No stridor. Cardiovascular: Normal rate, regular rhythm. No murmurs, rubs, or gallops. Respiratory: Normal respiratory effort without tachypnea nor retractions. Breath sounds are clear and equal bilaterally. No wheezes/rales/rhonchi. Gastrointestinal: Soft and nontender. Normal bowel sounds.  No blood from his ostomy is noted at this time Musculoskeletal: Nontender with normal range of motion in extremities.   Lower extremity edema is noted Neurologic:  Normal speech and language. No gross focal neurologic deficits are appreciated.  Skin:  Skin is warm, dry with pallor noted.  Scattered contusions are noted Psychiatric: Mood and affect are normal. Speech and behavior are normal.  ____________________________________________  EKG: Interpreted by me.  Sinus rhythm with rate of 99 bpm, PVCs, normal axis, normal QT  ____________________________________________  ED COURSE:  As part of my medical decision making, I reviewed the following data within the [place] History obtained from family if available, nursing notes, old chart and ekg, as well as notes from prior ED visits. Patient presented for gastrointestinal bleeding, we will assess with labs and imaging as indicated at this time.   Procedures  DONTAVIOUS EMILY was evaluated in Emergency Department on 04/20/2019 for the symptoms described in the history of present illness. He was evaluated in the context of the global COVID-19 pandemic, which necessitated consideration that the patient might be at risk for infection with the SARS-CoV-2 virus that causes COVID-19. Institutional protocols and algorithms that pertain to the evaluation of patients at risk for COVID-19 are in a state of rapid change based on information released by regulatory bodies including the CDC and federal and state organizations. These policies and algorithms were followed during the patient's care in the ED.  ____________________________________________   CRITICAL CARE Performed by: Laurence Aly   Total critical care time: 30 minutes  Critical care time was exclusive of separately billable procedures and treating other patients.  Critical care was necessary to treat or prevent imminent or life-threatening deterioration.  Critical care was time spent personally by me on the following activities: development of treatment plan with patient and/or  surrogate as well as nursing, discussions with consultants, evaluation of patient's response to treatment, examination of patient, obtaining history from patient or surrogate, ordering and performing treatments and interventions, ordering and review of laboratory studies, ordering and review of radiographic studies, pulse oximetry and re-evaluation of patient's condition.  LABS (pertinent positives/negatives)  Labs Reviewed  CBC - Abnormal; Notable for the following components:      Result Value   RBC 1.93 (*)    Hemoglobin 5.5 (*)    HCT 17.0 (*)    RDW 16.9 (*)    All other components within normal limits  SARS CORONAVIRUS 2 (HOSPITAL ORDER, [facility])  BASIC METABOLIC PANEL  PROTIME-INR  PREPARE RBC (CROSSMATCH)  TYPE AND SCREEN  TROPONIN I (HIGH SENSITIVITY)   ____________________________________________   DIFFERENTIAL DIAGNOSIS   Anemia, gastrointestinal bleeding, dehydration, electrolyte abnormality, sepsis  FINAL ASSESSMENT AND PLAN  Gastrointestinal bleeding, acute blood loss anemia   Plan: The patient had presented for gastrointestinal bleeding and was hypotensive on arrival.  I did order emergency release blood as he is hypotensive with GI bleeding.  Patient's labs did indicate significant anemia with a hemoglobin of 5.5. Patient's imaging revealed a  small left pleural effusion.  He does take a baby aspirin but no other anticoagulants.  We have given blood which has improved his blood pressure.  He remains awake and alert, mildly tachycardic likely secondary to anemia.   Laurence Aly, MD    Note: This note was generated in part or whole with voice recognition software. Voice recognition is usually quite accurate but there are transcription errors that can and very often do occur. I apologize for any typographical errors that were not detected and corrected.     Earleen Newport, MD 04/20/19 1425

## 2019-04-20 NOTE — ED Notes (Signed)
Bettey Costa MD at bedside

## 2019-04-20 NOTE — Progress Notes (Addendum)
PHARMACY NOTE:  ANTIMICROBIAL RENAL DOSAGE ADJUSTMENT  Current antimicrobial regimen includes a mismatch between antimicrobial dosage and estimated renal function.  As per policy approved by the Pharmacy & Therapeutics and Medical Executive Committees, the antimicrobial dosage will be adjusted accordingly.  Current antimicrobial dosage:  Cephalexin 500 mg TID   Indication: UTI   Renal Function: 19.5 mL/min   Estimated Creatinine Clearance: 19.5 mL/min (A) (by C-G formula based on SCr of 2.96 mg/dL (H)). []      On intermittent HD, scheduled: []      On CRRT    Antimicrobial dosage has been changed to:  Cephalexin 250 mg TID. Will follow renal function with AM labs.    Thank you for allowing pharmacy to be a part of this patient's care.  Rowland Lathe, [facility] 04/20/2019 4:31 PM

## 2019-04-20 NOTE — H&P (Signed)
Bryan Brewer at [place] NAME: Bryan Brewer    MR#:  [id number]  DATE OF BIRTH:  02/06/1941  DATE OF ADMISSION:  04/20/2019  PRIMARY CARE PHYSICIAN: Glendon Axe, MD   REQUESTING/REFERRING PHYSICIAN:  Dr Joni Fears  CHIEF COMPLAINT:   Rectal bleeding HISTORY OF PRESENT ILLNESS:  Bryan Brewer  is a 78 y.o. male with a known history of bladder cancer and recent hospitalization for sepsis due to UTI who presents today to the emergency room due to rectal bleeding.  Patient reports that this morning he had a large bloody bowel movement.  He had no abdominal pain.  He takes aspirin daily.  He takes no NSAIDs.  In the emergency room he is guaiac positive.  He has no more rectal bleeding.  He reports his last colonoscopy was in 2013.  He has not had abnormal colonoscopies in the past.  PAST MEDICAL HISTORY:   Past Medical History:  Diagnosis Date  . Bladder cancer ([place])   . COPD (chronic obstructive pulmonary disease) ([place])   . Hyperlipidemia   . Hypertension   . Kidney disease   . Type 2 diabetes mellitus ([place])     PAST SURGICAL HISTORY:   Past Surgical History:  Procedure Laterality Date  . LEFT HEART CATH AND CORONARY ANGIOGRAPHY N/A 10/31/2017   Procedure: LEFT HEART CATH AND CORONARY ANGIOGRAPHY;  Surgeon: Yolonda Kida, MD;  Location: [facility];  Service: Cardiovascular;  Laterality: N/A;  . REVISION UROSTOMY CUTANEOUS      SOCIAL HISTORY:   Social History   Tobacco Use  . Smoking status: Former Smoker    Packs/day: 1.50    Years: 35.00    Pack years: 52.50    Types: Cigarettes    Quit date: 2013    Years since quitting: 7.7  . Smokeless tobacco: Never Used  Substance Use Topics  . Alcohol use: No    Frequency: Never    FAMILY HISTORY:   Family History  Problem Relation Age of Onset  . CAD Mother   . Stroke Mother   . CAD Father   . Stroke Father   . Breast cancer Sister     DRUG ALLERGIES:  No  Known Allergies  REVIEW OF SYSTEMS:   Review of Systems  Constitutional: Negative.  Negative for chills, fever and malaise/fatigue.  HENT: Negative.  Negative for ear discharge, ear pain, hearing loss, nosebleeds and sore throat.   Eyes: Negative.  Negative for blurred vision and pain.  Respiratory: Negative.  Negative for cough, hemoptysis, shortness of breath and wheezing.   Cardiovascular: Negative.  Negative for chest pain, palpitations and leg swelling.  Gastrointestinal: Positive for blood in stool. Negative for abdominal pain, diarrhea, nausea and vomiting.  Genitourinary: Negative.  Negative for dysuria.  Musculoskeletal: Negative.  Negative for back pain.  Skin: Negative.   Neurological: Negative for dizziness, tremors, speech change, focal weakness, seizures and headaches.  Endo/Heme/Allergies: Negative.  Does not bruise/bleed easily.  Psychiatric/Behavioral: Negative.  Negative for depression, hallucinations and suicidal ideas.    MEDICATIONS AT HOME:   Prior to Admission medications   Medication Sig Start Date End Date Taking? Authorizing Provider  aspirin EC 81 MG tablet Take 81 mg by mouth daily.   Yes [provider]  atorvastatin (LIPITOR) 80 MG tablet Take 80 mg by mouth at bedtime.   Yes [provider]  blood glucose meter kit and supplies KIT Dispense based on patient and insurance preference.  Use up to four times daily as directed. (FOR ICD-9 250.00, 250.01). 04/04/19  Yes Gladstone Lighter, MD  cephALEXin (KEFLEX) 500 MG capsule Take 500 mg by mouth 3 (three) times daily. 04/16/19 04/26/19 Yes [provider]  furosemide (LASIX) 40 MG tablet Take 40 mg by mouth daily.   Yes [provider]  glimepiride (AMARYL) 2 MG tablet Take 2 mg by mouth daily with breakfast.   Yes [provider]  guaiFENesin (MUCINEX) 600 MG 12 hr tablet Take 1 tablet (600 mg total) by mouth 2 (two) times daily. 04/04/19  Yes Gladstone Lighter, MD   ipratropium-albuterol (DUONEB) 0.5-2.5 (3) MG/3ML SOLN Take 3 mLs by nebulization every 6 (six) hours as needed (wheezing, shortness of breath). 04/04/19  Yes Gladstone Lighter, MD  metoprolol tartrate (LOPRESSOR) 25 MG tablet Take 1 tablet (25 mg total) by mouth 2 (two) times daily. Patient taking differently: Take 12.5 mg by mouth 2 (two) times daily.  04/04/19  Yes Gladstone Lighter, MD  mometasone-formoterol (DULERA) 200-5 MCG/ACT AERO Inhale 2 puffs into the lungs 2 (two) times daily. 04/04/19  Yes Gladstone Lighter, MD  sodium bicarbonate 650 MG tablet Take 1,300 mg by mouth 3 (three) times daily.   Yes [provider]      VITAL SIGNS:  Blood pressure (!) 103/52, pulse 82, temperature 97.7 F (36.5 C), temperature source Oral, resp. rate (!) 21, height 5' 7" (1.702 m), weight 65.8 kg, SpO2 100 %.  PHYSICAL EXAMINATION:   Physical Exam Constitutional:      General: He is not in acute distress. HENT:     Head: Normocephalic.     Mouth/Throat:     Mouth: Mucous membranes are moist.  Eyes:     General: No scleral icterus. Neck:     Musculoskeletal: Normal range of motion and neck supple.     Vascular: No JVD.     Trachea: No tracheal deviation.  Cardiovascular:     Rate and Rhythm: Normal rate and regular rhythm.     Heart sounds: Normal heart sounds. No murmur. No friction rub. No gallop.   Pulmonary:     Effort: Pulmonary effort is normal. No respiratory distress.     Breath sounds: Normal breath sounds. No wheezing or rales.  Chest:     Chest wall: No tenderness.  Abdominal:     General: Bowel sounds are normal. There is no distension.     Palpations: Abdomen is soft. There is no mass.     Tenderness: There is no abdominal tenderness. There is no guarding or rebound.  Musculoskeletal: Normal range of motion.  Skin:    General: Skin is warm.     Findings: No erythema or rash.  Neurological:     General: No focal deficit present.     Mental Status: He is  alert and oriented to person, place, and time. Mental status is at baseline.  Psychiatric:        Judgment: Judgment normal.       LABORATORY PANEL:   CBC Recent Labs  Lab 04/20/19 1354  WBC 10.2  HGB 5.5*  HCT 17.0*  PLT 190   ------------------------------------------------------------------------------------------------------------------  Chemistries  Recent Labs  Lab 04/20/19 1354  NA 131*  K 3.8  CL 93*  CO2 18*  GLUCOSE 222*  BUN 111*  CREATININE 2.96*  CALCIUM 7.6*   ------------------------------------------------------------------------------------------------------------------  Cardiac Enzymes No results for input(s): TROPONINI in the last 168 hours. ------------------------------------------------------------------------------------------------------------------  RADIOLOGY:  Dg Chest Port 1 [address]  Result Date: 04/20/2019 CLINICAL DATA:  Shortness of breath and hypotension. Blood in stool today. EXAM: PORTABLE CHEST 1 VIEW COMPARISON:  None. FINDINGS: Small left pleural effusion and mild basilar atelectasis are seen. Lungs otherwise clear. Lungs are emphysematous. No pneumothorax. Heart size is upper normal. The patient is status post CABG. Aortic atherosclerosis noted. IMPRESSION: Small left pleural effusion and mild basilar atelectasis. Emphysema. Atherosclerosis. Electronically Signed   By: Inge Rise M.D.   On: 04/20/2019 14:07    EKG:  Sinus rhythm PVCs no ST elevation or depression  IMPRESSION AND PLAN:   78 year old male with history of bladder cancer and recent hospitalization for sepsis due to UTI presents with rectal bleeding.  1.  Acute blood loss anemia due to rectal bleeding/GI bleed: Patient received 2 unit PRBC Most recent anemia panel in August shows anemia of chronic disease GI bleeding scan has been ordered. Follow hemoglobin Consultation placed to Dr. Vicente Males via epic Will need EGD/colonoscopy N.p.o. after midnight  2.   Diabetes: Sliding scale  3.  Hyperlipidemia: Continue statin  4.  Recent UTI/sepsis: Continue course of Keflex  5.  Chronic kidney disease stage IV: Creatinine is at baseline  All the records are reviewed and case discussed with ED provider. Management plans discussed with the patient and wife and they are in agreement  CODE STATUS: FULL  TOTAL TIME TAKING CARE OF THIS PATIENT: 45 minutes.    Bettey Costa M.D on 04/20/2019 at 4:16 PM  Between 7am to 6pm - Pager - [phone]  After 6pm go to [email or web address] - password [email or web address] [facility]  Office  [phone]  CC: Primary care physician; Glendon Axe, MD

## 2019-04-21 ENCOUNTER — Telehealth: Payer: Self-pay

## 2019-04-21 ENCOUNTER — Inpatient Hospital Stay: Payer: Medicare Other | Admitting: Registered Nurse

## 2019-04-21 ENCOUNTER — Encounter
Admission: EM | Disposition: A | Discharge status: Home / Self Care | Payer: Self-pay | Source: Home / Self Care | Attending: Internal Medicine

## 2019-04-21 DIAGNOSIS — K922 Gastrointestinal hemorrhage, unspecified: Secondary | ICD-10-CM

## 2019-04-21 DIAGNOSIS — R002 Palpitations: Secondary | ICD-10-CM

## 2019-04-21 DIAGNOSIS — R000 Tachycardia, unspecified: Secondary | ICD-10-CM

## 2019-04-21 DIAGNOSIS — K3182 Dieulafoy lesion (hemorrhagic) of stomach and duodenum: Principal | ICD-10-CM

## 2019-04-21 HISTORY — PX: ESOPHAGOGASTRODUODENOSCOPY (EGD) WITH PROPOFOL: SHX5813

## 2019-04-21 LAB — CBC
HCT: 22.5 % — ABNORMAL LOW (ref 39.0–52.0)
Hemoglobin: 7.5 g/dL — ABNORMAL LOW (ref 13.0–17.0)
MCH: 28.4 pg (ref 26.0–34.0)
MCHC: 33.3 g/dL (ref 30.0–36.0)
MCV: 85.2 fL (ref 80.0–100.0)
Platelets: 162 10*3/uL (ref 150–400)
RBC: 2.64 MIL/uL — ABNORMAL LOW (ref 4.22–5.81)
RDW: 16.2 % — ABNORMAL HIGH (ref 11.5–15.5)
WBC: 5.9 10*3/uL (ref 4.0–10.5)
nRBC: 0 % (ref 0.0–0.2)

## 2019-04-21 LAB — MAGNESIUM: Magnesium: 1.8 mg/dL (ref 1.7–2.4)

## 2019-04-21 LAB — TSH: TSH: 1.373 u[IU]/mL (ref 0.350–4.500)

## 2019-04-21 LAB — GLUCOSE, CAPILLARY
Glucose-Capillary: 70 mg/dL (ref 70–99)
Glucose-Capillary: 95 mg/dL (ref 70–99)
Glucose-Capillary: 104 mg/dL — ABNORMAL HIGH (ref 70–99)
Glucose-Capillary: 84 mg/dL (ref 70–99)

## 2019-04-21 LAB — BASIC METABOLIC PANEL
Anion gap: 11 (ref 5–15)
BUN: 115 mg/dL — ABNORMAL HIGH (ref 8–23)
CO2: 21 mmol/L — ABNORMAL LOW (ref 22–32)
Calcium: 7.5 mg/dL — ABNORMAL LOW (ref 8.9–10.3)
Chloride: 104 mmol/L (ref 98–111)
Creatinine, Ser: 2.61 mg/dL — ABNORMAL HIGH (ref 0.61–1.24)
GFR calc Af Amer: 26 mL/min — ABNORMAL LOW (ref >60)
GFR calc non Af Amer: 23 mL/min — ABNORMAL LOW (ref >60)
Glucose, Bld: 97 mg/dL (ref 70–99)
Potassium: 3.2 mmol/L — ABNORMAL LOW (ref 3.5–5.1)
Sodium: 136 mmol/L (ref 135–145)

## 2019-04-21 SURGERY — ESOPHAGOGASTRODUODENOSCOPY (EGD) WITH PROPOFOL
Anesthesia: General

## 2019-04-21 MED ORDER — SODIUM CHLORIDE 0.9 % IV SOLN
500.0000 mg | Freq: Two times a day (BID) | INTRAVENOUS | Status: DC
  Administered 2019-04-21 – 2019-04-22 (×3): 500 mg via INTRAVENOUS
  Filled 2019-04-21 (×2): qty 500
  Filled 2019-04-21: qty 0.5
  Filled 2019-04-21: qty 500

## 2019-04-21 MED ORDER — PROPOFOL 500 MG/50ML IV EMUL
INTRAVENOUS | Status: DC | PRN
  Administered 2019-04-21: 150 ug/kg/min via INTRAVENOUS

## 2019-04-21 MED ORDER — POTASSIUM CHLORIDE IN NACL 20-0.9 MEQ/L-% IV SOLN
INTRAVENOUS | Status: DC
  Administered 2019-04-21 – 2019-04-23 (×3): via INTRAVENOUS
  Filled 2019-04-21 (×5): qty 1000

## 2019-04-21 MED ORDER — PHENYLEPHRINE HCL (PRESSORS) 10 MG/ML IV SOLN
INTRAVENOUS | Status: DC | PRN
  Administered 2019-04-21 (×5): 100 ug via INTRAVENOUS

## 2019-04-21 MED ORDER — SODIUM CHLORIDE 0.9 % IV SOLN
Freq: Once | INTRAVENOUS | Status: AC
  Administered 2019-04-21: 13:00:00 via INTRAVENOUS

## 2019-04-21 MED ORDER — SODIUM CHLORIDE 0.9 % IV SOLN
INTRAVENOUS | Status: DC
  Administered 2019-04-21: 10:00:00 via INTRAVENOUS

## 2019-04-21 MED ORDER — GLYCOPYRROLATE 0.2 MG/ML IJ SOLN
INTRAMUSCULAR | Status: DC | PRN
  Administered 2019-04-21: 0.2 mg via INTRAVENOUS

## 2019-04-21 MED ORDER — LIDOCAINE HCL (CARDIAC) PF 100 MG/5ML IV SOSY
PREFILLED_SYRINGE | INTRAVENOUS | Status: DC | PRN
  Administered 2019-04-21: 40 mg via INTRAVENOUS

## 2019-04-21 MED ORDER — PROPOFOL 10 MG/ML IV BOLUS
INTRAVENOUS | Status: DC | PRN
  Administered 2019-04-21: 90 mg via INTRAVENOUS

## 2019-04-21 MED ORDER — SODIUM CHLORIDE (PF) 0.9 % IJ SOLN
PREFILLED_SYRINGE | INTRAMUSCULAR | Status: DC | PRN
  Administered 2019-04-21: 4 mL

## 2019-04-21 MED ORDER — PROPOFOL 500 MG/50ML IV EMUL
INTRAVENOUS | Status: AC
  Filled 2019-04-21: qty 50

## 2019-04-21 MED ORDER — ESMOLOL HCL 100 MG/10ML IV SOLN
INTRAVENOUS | Status: DC | PRN
  Administered 2019-04-21 (×2): 20 mg via INTRAVENOUS

## 2019-04-21 MED ORDER — GLYCOPYRROLATE 0.2 MG/ML IJ SOLN
INTRAMUSCULAR | Status: AC
  Filled 2019-04-21: qty 1

## 2019-04-21 NOTE — Consult Note (Signed)
Jonathon Bellows , MD [address] [place], [place], [place], [address] [address] [place], [place], [place], [address] Phone: [phone]  Fax: [phone]  Consultation  Referring Provider:   Dr Vianne Bulls Primary Care Physician:  Glendon Axe, MD Primary Gastroenterologist:  Dr. Denice Paradise          Reason for Consultation:     GI bleed  Date of Admission:  04/20/2019 Date of Consultation:  04/21/2019         HPI:   Bryan Brewer is a 78 y.o. male has established gastroenterology care at [facility].  Last seen at their office in May 2024 small bowel bacterial overgrowth.  He has a history of bladder cancer, sepsis related to UTI.  He presented to the emergency room with rectal bleeding.  The bleeding occurred only on one occasion.  His last colonoscopy was in 2013  2 weeks back his hemoglobin was 8.8 g.  On admission it was 5.5 g.  BUN of 111 and creatinine of 2.96.  This morning hemoglobin is 7.5 g  He states that he had one episode of hematemesis yesterday and subsequently has been having dark tarry stools.  Some generalized abdominal discomfort.  He denies any NSAID use.  No other episodes of hematemesis since admission.  Past Medical History:  Diagnosis Date  . Bladder cancer ([place])   . COPD (chronic obstructive pulmonary disease) ([place])   . Hyperlipidemia   . Hypertension   . Kidney disease   . Type 2 diabetes mellitus ([place])     Past Surgical History:  Procedure Laterality Date  . LEFT HEART CATH AND CORONARY ANGIOGRAPHY N/A 10/31/2017   Procedure: LEFT HEART CATH AND CORONARY ANGIOGRAPHY;  Surgeon: Yolonda Kida, MD;  Location: [facility];  Service: Cardiovascular;  Laterality: N/A;  . REVISION UROSTOMY CUTANEOUS      Prior to Admission medications   Medication Sig Start Date End Date Taking? Authorizing Provider  aspirin EC 81 MG tablet Take 81 mg by mouth daily.   Yes [provider]  atorvastatin (LIPITOR) 80 MG tablet Take 80 mg by mouth  at bedtime.   Yes [provider]  blood glucose meter kit and supplies KIT Dispense based on patient and insurance preference. Use up to four times daily as directed. (FOR ICD-9 250.00, 250.01). 04/04/19  Yes Gladstone Lighter, MD  cephALEXin (KEFLEX) 500 MG capsule Take 500 mg by mouth 3 (three) times daily. 04/16/19 04/26/19 Yes [provider]  furosemide (LASIX) 40 MG tablet Take 40 mg by mouth daily.   Yes [provider]  glimepiride (AMARYL) 2 MG tablet Take 2 mg by mouth daily with breakfast.   Yes [provider]  guaiFENesin (MUCINEX) 600 MG 12 hr tablet Take 1 tablet (600 mg total) by mouth 2 (two) times daily. 04/04/19  Yes Gladstone Lighter, MD  ipratropium-albuterol (DUONEB) 0.5-2.5 (3) MG/3ML SOLN Take 3 mLs by nebulization every 6 (six) hours as needed (wheezing, shortness of breath). 04/04/19  Yes Gladstone Lighter, MD  metoprolol tartrate (LOPRESSOR) 25 MG tablet Take 1 tablet (25 mg total) by mouth 2 (two) times daily. Patient taking differently: Take 12.5 mg by mouth 2 (two) times daily.  04/04/19  Yes Gladstone Lighter, MD  mometasone-formoterol (DULERA) 200-5 MCG/ACT AERO Inhale 2 puffs into the lungs 2 (two) times daily. 04/04/19  Yes Gladstone Lighter, MD  sodium bicarbonate 650 MG tablet Take 1,300 mg by mouth 3 (three) times daily.   Yes [provider]    Family History  Problem Relation Age of Onset  . CAD Mother   . Stroke Mother   . CAD Father   . Stroke Father   . Breast cancer Sister      Social History   Tobacco Use  . Smoking status: Former Smoker    Packs/day: 1.50    Years: 35.00    Pack years: 52.50    Types: Cigarettes    Quit date: 2013    Years since quitting: 7.7  . Smokeless tobacco: Never Used  Substance Use Topics  . Alcohol use: No    Frequency: Never  . Drug use: Never    Allergies as of 04/20/2019  . (No Known Allergies)    Review of Systems:    All systems reviewed and negative  except where noted in HPI.   Physical Exam:  Vital signs in last 24 hours: Temp:  [97.6 F (36.4 C)-97.9 F (36.6 C)] 97.8 F (36.6 C) (09/15 0455) Pulse Rate:  [70-95] 80 (09/15 0455) Resp:  [14-26] 20 (09/15 0455) BP: (77-112)/(36-97) 94/43 (09/15 0455) SpO2:  [97 %-100 %] 97 % (09/15 0455) Weight:  [65.8 kg] 65.8 kg (09/14 1329) Last BM Date: 04/20/19 General:   Pleasant, cooperative in NAD Head:  Normocephalic and atraumatic. Eyes:   No icterus.   Conjunctiva pink. PERRLA. Ears:  Normal auditory acuity. Neck:  Supple; no masses or thyroidomegaly Lungs: Respirations even and unlabored. Lungs clear to auscultation bilaterally.   No wheezes, crackles, or rhonchi.  Heart:  Regular rate and rhythm;  Without murmur, clicks, rubs or gallops Abdomen:  Soft, nondistended, nontender. Normal bowel sounds. No appreciable masses or hepatomegaly.  No rebound or guarding.  Urostomy bag seen Neurologic:  Alert and oriented x3;  grossly normal neurologically. Skin:  Intact without significant lesions or rashes. Cervical Nodes:  No significant cervical adenopathy. Psych:  Alert and cooperative. Normal affect.  LAB RESULTS: Recent Labs    04/20/19 1354 04/21/19 0326  WBC 10.2 5.9  HGB 5.5* 7.5*  HCT 17.0* 22.5*  PLT 190 162   BMET Recent Labs    04/20/19 1354 04/21/19 0326  NA 131* 136  K 3.8 3.2*  CL 93* 104  CO2 18* 21*  GLUCOSE 222* 97  BUN 111* 115*  CREATININE 2.96* 2.61*  CALCIUM 7.6* 7.5*   LFT No results for input(s): PROT, ALBUMIN, AST, ALT, ALKPHOS, BILITOT, BILIDIR, IBILI in the last 72 hours. PT/INR Recent Labs    04/20/19 1354  LABPROT 16.2*  INR 1.3*    STUDIES: Dg Chest Port 1 View  Result Date: 04/20/2019 CLINICAL DATA:  Shortness of breath and hypotension. Blood in stool today. EXAM: PORTABLE CHEST 1 VIEW COMPARISON:  None. FINDINGS: Small left pleural effusion and mild basilar atelectasis are seen. Lungs otherwise clear. Lungs are emphysematous. No  pneumothorax. Heart size is upper normal. The patient is status post CABG. Aortic atherosclerosis noted. IMPRESSION: Small left pleural effusion and mild basilar atelectasis. Emphysema. Atherosclerosis. Electronically Signed   By: Inge Rise M.D.   On: 04/20/2019 14:07      Impression / Plan:   Bryan Brewer is a 78 y.o. y/o male with with a history of bladder cancer admitted with hematemesis on 1 occasion and melena.  Drop in hemoglobin to 5.5 g.  Status post transfusion.  Increasing BUN/creatinine ratio suggestive of an upper GI bleed.  Plan 1.  EGD today 2.  IV PPI  I have discussed alternative options, risks &  benefits,  which include, but are not limited to, bleeding, infection, perforation,respiratory complication & drug reaction.  The patient agrees with this plan & written consent will be obtained.     Thank you for involving me in the care of this patient.      LOS: 1 day   Jonathon Bellows, MD  04/21/2019, 8:52 AM

## 2019-04-21 NOTE — Progress Notes (Signed)
Called report to [place], RN and notified that procedure had to be aborted due to tachycardia. Also notified that Dr. Vianne Bulls has requested transfer to 2A cardiac telemetry unit.

## 2019-04-21 NOTE — Anesthesia Procedure Notes (Signed)
Date/Time: 04/21/2019 10:39 AM Performed by: Doreen Salvage, CRNA Pre-anesthesia Checklist: Patient identified, Emergency Drugs available, Suction available and Patient being monitored Patient Re-evaluated:Patient Re-evaluated prior to induction Oxygen Delivery Method: Nasal cannula Induction Type: IV induction Dental Injury: Teeth and Oropharynx as per pre-operative assessment  Comments: Nasal cannula with etCO2 monitoring

## 2019-04-21 NOTE — H&P (Signed)
Jonathon Bellows, MD [address] [place], [place], [place], [address] [address], [place], [place], [place], [address] Phone: [phone]  Fax: [phone]  Primary Care Physician:  Glendon Axe, MD   Pre-Procedure History & Physical: HPI:  Bryan Brewer is a 78 y.o. male is here for an endoscopy    Past Medical History:  Diagnosis Date  . Bladder cancer ([place])   . COPD (chronic obstructive pulmonary disease) ([place])   . Hyperlipidemia   . Hypertension   . Kidney disease   . Type 2 diabetes mellitus ([place])     Past Surgical History:  Procedure Laterality Date  . COLONOSCOPY    . LEFT HEART CATH AND CORONARY ANGIOGRAPHY N/A 10/31/2017   Procedure: LEFT HEART CATH AND CORONARY ANGIOGRAPHY;  Surgeon: Yolonda Kida, MD;  Location: [facility];  Service: Cardiovascular;  Laterality: N/A;  . REVISION UROSTOMY CUTANEOUS      Prior to Admission medications   Medication Sig Start Date End Date Taking? Authorizing Provider  aspirin EC 81 MG tablet Take 81 mg by mouth daily.   Yes [provider]  atorvastatin (LIPITOR) 80 MG tablet Take 80 mg by mouth at bedtime.   Yes [provider]  blood glucose meter kit and supplies KIT Dispense based on patient and insurance preference. Use up to four times daily as directed. (FOR ICD-9 250.00, 250.01). 04/04/19  Yes Gladstone Lighter, MD  cephALEXin (KEFLEX) 500 MG capsule Take 500 mg by mouth 3 (three) times daily. 04/16/19 04/26/19 Yes [provider]  furosemide (LASIX) 40 MG tablet Take 40 mg by mouth daily.   Yes [provider]  glimepiride (AMARYL) 2 MG tablet Take 2 mg by mouth daily with breakfast.   Yes [provider]  guaiFENesin (MUCINEX) 600 MG 12 hr tablet Take 1 tablet (600 mg total) by mouth 2 (two) times daily. 04/04/19  Yes Gladstone Lighter, MD  ipratropium-albuterol (DUONEB) 0.5-2.5 (3) MG/3ML SOLN Take 3 mLs by nebulization every 6 (six) hours as needed  (wheezing, shortness of breath). 04/04/19  Yes Gladstone Lighter, MD  metoprolol tartrate (LOPRESSOR) 25 MG tablet Take 1 tablet (25 mg total) by mouth 2 (two) times daily. 04/04/19  Yes Gladstone Lighter, MD  mometasone-formoterol (DULERA) 200-5 MCG/ACT AERO Inhale 2 puffs into the lungs 2 (two) times daily. 04/04/19  Yes Gladstone Lighter, MD  sodium bicarbonate 650 MG tablet Take 1,300 mg by mouth 3 (three) times daily.   Yes [provider]    Allergies as of 04/20/2019  . (No Known Allergies)    Family History  Problem Relation Age of Onset  . CAD Mother   . Stroke Mother   . CAD Father   . Stroke Father   . Breast cancer Sister     Social History   Socioeconomic History  . Marital status: Married    Spouse name: Not on file  . Number of children: Not on file  . Years of education: Not on file  . Highest education level: Not on file  Occupational History  . Not on file  Social Needs  . Financial resource strain: Not hard at all  . Food insecurity    Worry: Patient refused    Inability: Patient refused  . Transportation needs    Medical: Patient refused    Non-medical: Patient refused  Tobacco Use  . Smoking status: Former Smoker    Packs/day: 1.50    Years: 35.00  Pack years: 52.50    Types: Cigarettes    Quit date: 2013    Years since quitting: 7.7  . Smokeless tobacco: Never Used  Substance and Sexual Activity  . Alcohol use: No    Frequency: Never  . Drug use: Never  . Sexual activity: Not Currently  Lifestyle  . Physical activity    Days per week: Patient refused    Minutes per session: Patient refused  . Stress: Patient refused  Relationships  . Social Herbalist on phone: Patient refused    Gets together: Patient refused    Attends religious service: Patient refused    Active member of club or organization: Patient refused    Attends meetings of clubs or organizations: Patient refused    Relationship status: Patient  refused  . Intimate partner violence    Fear of current or ex partner: Patient refused    Emotionally abused: Patient refused    Physically abused: Patient refused    Forced sexual activity: Patient refused  Other Topics Concern  . Not on file  Social History Narrative  . Not on file    Review of Systems: See HPI, otherwise negative ROS  Physical Exam: BP (!) 94/43 (BP Location: Left Arm)   Pulse 80   Temp 97.8 F (36.6 C) (Oral)   Resp 20   Ht 5' 7"  (1.702 m)   Wt 65.8 kg   SpO2 97%   BMI 22.71 kg/m  General:   Alert,  pleasant and cooperative in NAD Head:  Normocephalic and atraumatic. Neck:  Supple; no masses or thyromegaly. Lungs:  Clear throughout to auscultation, normal respiratory effort.    Heart:  +S1, +S2, Regular rate and rhythm, No edema. Abdomen:  Soft, nontender and nondistended. Normal bowel sounds, without guarding, and without rebound.   Neurologic:  Alert and  oriented x4;  grossly normal neurologically.  Impression/Plan: Bryan Brewer is here for an endoscopy  to be performed for  evaluation of hematemesis    Risks, benefits, limitations, and alternatives regarding endoscopy have been reviewed with the patient.  Questions have been answered.  All parties agreeable.   Jonathon Bellows, MD  04/21/2019, 10:20 AM

## 2019-04-21 NOTE — Progress Notes (Signed)
Pharmacy Antibiotic Note  Bryan Brewer is a 78 y.o. male admitted on 04/20/2019 with UTI.  Pharmacy has been consulted for Meropenem dosing. Patient with history of ESBL UTI.  Plan: Meropenem 500mg  IV E32W, adjusted for CrCl of 22 ml/min  Height: 5\' 7"  (170.2 cm) Weight: 144 lb 13.5 oz (65.7 kg) IBW/kg (Calculated) : 66.1  Temp (24hrs), Avg:97.7 F (36.5 C), Min:97.4 F (36.3 C), Max:97.9 F (36.6 C)  Recent Labs  Lab 04/20/19 1354 04/21/19 0326  WBC 10.2 5.9  CREATININE 2.96* 2.61*    Estimated Creatinine Clearance: 22 mL/min (A) (by C-G formula based on SCr of 2.61 mg/dL (H)).    No Known Allergies  Antimicrobials this admission: Meropenem 9/15 >>   Thank you for allowing pharmacy to be a part of this patient's care.  Paulina Fusi, PharmD, BCPS 04/21/2019 2:01 PM

## 2019-04-21 NOTE — Progress Notes (Signed)
Continue n.p.o., continue IV PPIs for 72 hours, high respiratory bleeding, GI recommended to continue n.p.o.

## 2019-04-21 NOTE — Telephone Encounter (Signed)
zio ordered to be mailed to pt home.   Call to patient, No answer. Left message with details. Asked pt to call back to review instructions.   Call patient back for TCM/scheduling.

## 2019-04-21 NOTE — Telephone Encounter (Signed)
-----   Message from Arvil Chaco, PA-C sent at 04/21/2019  3:16 PM EDT ----- Regarding: ZIO XT - To Mail Hello,  This patient was admitted and seen at [facility] for sinus tachycardia.   We are expecting discharge later this week.  He is a new patient to [facility] heart care.  Previously followed by Dr. Clayborn Bigness; however, he indicated that he would like to start seeing our providers.  (1) Can you please mail him a ZIO XT. He does not need a live monitor, given he did not have syncope or LOC. (2) Can you please call and schedule a new patient appointment and follow-up to review Zio results approximately 2 weeks after completion of Zio (~ 1 month)?  Thank you! Signed, Arvil Chaco, PA-C 04/21/2019, 3:17 PM Pager [phone]

## 2019-04-21 NOTE — Anesthesia Post-op Follow-up Note (Signed)
Anesthesia QCDR form completed.        

## 2019-04-21 NOTE — Transfer of Care (Signed)
Immediate Anesthesia Transfer of Care Note  Patient: Bryan Brewer  Procedure(s) Performed: Procedure(s): ESOPHAGOGASTRODUODENOSCOPY (EGD) WITH PROPOFOL (N/A)  Patient Location: PACU and Endoscopy Unit  Anesthesia Type:General  Level of Consciousness: sedated  Airway & Oxygen Therapy: Patient Spontanous Breathing and Patient connected to nasal cannula oxygen  Post-op Assessment: Report given to RN and Post -op Vital signs reviewed and stable  Post vital signs: Reviewed and stable  Last Vitals:  Vitals:   04/21/19 1118 04/21/19 1119  BP: (!) 107/56 (!) 107/56  Pulse:    Resp: 16   Temp:    SpO2: 580%     Complications: No apparent anesthesia complications

## 2019-04-21 NOTE — Consult Note (Signed)
Cardiology Consultation:   Patient ID: Bryan Brewer MRN: [id number]; DOB: [birth year]  Admit date: 04/20/2019 Date of Consult: 04/21/2019  Primary Care Provider: Glendon Axe, MD Primary Cardiologist: Nelva Bush, MD  Primary Electrophysiologist:  None    Patient Profile:   Bryan Brewer is a 78 y.o. male with a hx of 2019 CABG, multivessel CAD, NSTEMI, hypertension, hyperlipidemia, DM 2, COPD with prior tobacco use, CKDIV, and bladder cancer who is being seen today for the evaluation of  at the request of Dr. Vianne Bulls.  History of Present Illness:   Bryan Brewer is a 78 yo male with PMH as above and including CABG, ICM, and SOB with COPD and CKDIV. He confirmed that he no longer smokes and does not drink alcohol or use any illegal drugs. Of note, he has requested that he see [facility]. Previously followed by Dr. Clayborn Bigness.   He is s/p NSTEMI at [facility] with cath showing mulivessel CAD and transfer to [facility] with subsequent CABG 11/01/2017. EF was estimated less than 35-40% with guideline directed medical therapy complicated by renal function. Patient stated that, since that time, he has experienced DOE. He stated he saw pulmonary medicine for his SOB without relief. He has been seen several times by Dr. Clayborn Bigness for DOE since his CABG. Review of EMR also shows past c/o increased heart rate, as well as daily and frequent palpitations. It was noted that DOE was likely multifactorial and in the setting of the patient's cardiomyopathy and underlying COPD. Last [facility]/11/2019.  Most recent echo performed 03/31/2019 and showed EF 60 to 65%, right atrial pressure 10 mmHg, mild MR/TR.   On 04/21/2019, the patient was admitted with GI consulted for endoscopy given recent rectal bleeding, hematemesis, and melena with abdominal discomfort. It was noted the patient had a Hgb of 5.5 and thus s/p transfusion with Hgb improved to 7.5 after transfusion. It was also noted that he had recent worsening  renal function and baseline Cr ~2.5-2.6.  It was also noted he had a history of recent UTI with urine culture showing Klebsiella and started on antibiotics.  Per review of EMR, the endoscopy was started with epi injection performed for visible vessel seen in stomach.  Following this epi, the patient developed elevated rates into the 170s.  IV esmolol 40 mg was administered with subsequent rate 90 bpm and BP stable at 170/70.  EKG showed sinus tachycardia with ectopy but no acute or concerning ST/T changes. Endoscopy procedure was not completed and documented as to be performed at a later date. Cardiology consulted for elevated rates. On review of most recent labs, it was noted patient was hypokalemic in addition to known acute blood loss anemia. Labs showed potassium 3.2, creatinine 2.61, BUN 115, hemoglobin 7.5, hematocrit 22.5.  Patient denied chest pain, palpitations, racing heart rate, shortness of breath, presyncope, or with recent syncopal episodes on cardiac consultation today.  He denied any chest pain since his CABG. He denied recent feelings of racing HR since his CABG. He continued to report daily and frequent palpitations.  He also noted DOE, which was an ongoing issue for him. He reported ongoing severe lower extremity edema (not seen on exam). He stated he slept in a medical chair/recliner for comfort and was unclear if he also slept in this chair for chronic orthopnea. He and his wife both reported that their main concern is for his ongoing shortness of breath. He confirmed he was asymptomatic at time of elevated rates.  Heart Pathway Score:     Past Medical History:  Diagnosis Date  . Bladder cancer ([place])   . COPD (chronic obstructive pulmonary disease) ([place])   . Hyperlipidemia   . Hypertension   . Kidney disease   . Type 2 diabetes mellitus ([place])     Past Surgical History:  Procedure Laterality Date  . COLONOSCOPY    . LEFT HEART CATH AND CORONARY ANGIOGRAPHY N/A 10/31/2017    Procedure: LEFT HEART CATH AND CORONARY ANGIOGRAPHY;  Surgeon: Yolonda Kida, MD;  Location: [place] [facility];  Service: Cardiovascular;  Laterality: N/A;  . REVISION UROSTOMY CUTANEOUS       Home Medications:  Prior to Admission medications   Medication Sig Start Date End Date Taking? Authorizing Provider  aspirin EC 81 MG tablet Take 81 mg by mouth daily.   Yes [provider]  atorvastatin (LIPITOR) 80 MG tablet Take 80 mg by mouth at bedtime.   Yes [provider]  blood glucose meter kit and supplies KIT Dispense based on patient and insurance preference. Use up to four times daily as directed. (FOR ICD-9 250.00, 250.01). 04/04/19  Yes Gladstone Lighter, MD  cephALEXin (KEFLEX) 500 MG capsule Take 500 mg by mouth 3 (three) times daily. 04/16/19 04/26/19 Yes [provider]  furosemide (LASIX) 40 MG tablet Take 40 mg by mouth daily.   Yes [provider]  glimepiride (AMARYL) 2 MG tablet Take 2 mg by mouth daily with breakfast.   Yes [provider]  guaiFENesin (MUCINEX) 600 MG 12 hr tablet Take 1 tablet (600 mg total) by mouth 2 (two) times daily. 04/04/19  Yes Gladstone Lighter, MD  ipratropium-albuterol (DUONEB) 0.5-2.5 (3) MG/3ML SOLN Take 3 mLs by nebulization every 6 (six) hours as needed (wheezing, shortness of breath). 04/04/19  Yes Gladstone Lighter, MD  metoprolol tartrate (LOPRESSOR) 25 MG tablet Take 1 tablet (25 mg total) by mouth 2 (two) times daily. 04/04/19  Yes Gladstone Lighter, MD  mometasone-formoterol (DULERA) 200-5 MCG/ACT AERO Inhale 2 puffs into the lungs 2 (two) times daily. 04/04/19  Yes Gladstone Lighter, MD  sodium bicarbonate 650 MG tablet Take 1,300 mg by mouth 3 (three) times daily.   Yes [provider]    Inpatient Medications: Scheduled Meds: . atorvastatin  80 mg Oral QHS  . cephALEXin  250 mg Oral TID  . insulin aspart  0-9 Units Subcutaneous TID WC  . mometasone-formoterol  2 puff  Inhalation BID  . pantoprazole (PROTONIX) IV  40 mg Intravenous Q12H  . sodium bicarbonate  1,300 mg Oral TID   Continuous Infusions: . sodium chloride 75 mL/hr at 04/21/19 0500   PRN Meds: acetaminophen **OR** acetaminophen, HYDROcodone-acetaminophen, ondansetron **OR** ondansetron (ZOFRAN) IV, polyethylene glycol  Allergies:   No Known Allergies  Social History:   Social History   Socioeconomic History  . Marital status: Married    Spouse name: Not on file  . Number of children: Not on file  . Years of education: Not on file  . Highest education level: Not on file  Occupational History  . Not on file  Social Needs  . Financial resource strain: Not hard at all  . Food insecurity    Worry: Patient refused    Inability: Patient refused  . Transportation needs    Medical: Patient refused    Non-medical: Patient refused  Tobacco Use  . Smoking status: Former Smoker    Packs/day: 1.50    Years: 35.00    Pack years:  52.50    Types: Cigarettes    Quit date: 2013    Years since quitting: 7.7  . Smokeless tobacco: Never Used  Substance and Sexual Activity  . Alcohol use: No    Frequency: Never  . Drug use: Never  . Sexual activity: Not Currently  Lifestyle  . Physical activity    Days per week: Patient refused    Minutes per session: Patient refused  . Stress: Patient refused  Relationships  . Social Herbalist on phone: Patient refused    Gets together: Patient refused    Attends religious service: Patient refused    Active member of club or organization: Patient refused    Attends meetings of clubs or organizations: Patient refused    Relationship status: Patient refused  . Intimate partner violence    Fear of current or ex partner: Patient refused    Emotionally abused: Patient refused    Physically abused: Patient refused    Forced sexual activity: Patient refused  Other Topics Concern  . Not on file  Social History Narrative  . Not on file     Family History:     Family History  Problem Relation Age of Onset  . CAD Mother   . Stroke Mother   . CAD Father   . Stroke Father   . Breast cancer Sister      ROS:  Please see the history of present illness.  Review of Systems  Respiratory: Positive for shortness of breath. Negative for hemoptysis.        Reported his main concern is for shortness of breath.  Cardiovascular: Positive for palpitations, orthopnea and leg swelling. Negative for chest pain.       No chest pain since CABG.  Ongoing complaint of daily infrequent palpitations.  Unclear if patient sleeps in medical recliner for orthopnea versus comfort.  Reports ongoing lower extremity edema (not seen on exam), which is not new for him.  Gastrointestinal: Positive for abdominal pain, blood in stool, diarrhea, melena and vomiting.       Recent hematemesis, melena, BRBPR, and abdominal pain.    Neurological: Negative for dizziness and loss of consciousness.  All other systems reviewed and are negative.   All other ROS reviewed and negative.     Physical Exam/Data:   Vitals:   04/21/19 1117 04/21/19 1118 04/21/19 1119 04/21/19 1301  BP: (!) 99/49 (!) 107/56 (!) 107/56 (!) 84/54  Pulse:    (!) 102  Resp:  16    Temp:    97.6 F (36.4 C)  TempSrc:    Oral  SpO2:  100%  100%  Weight:      Height:        Intake/Output Summary (Last 24 hours) at 04/21/2019 1316 Last data filed at 04/21/2019 1119 Gross per 24 hour  Intake 1066.22 ml  Output 600 ml  Net 466.22 ml   Last 3 Weights 04/21/2019 04/20/2019 04/04/2019  Weight (lbs) 144 lb 13.5 oz 145 lb 157 lb 13.6 oz  Weight (kg) 65.7 kg 65.772 kg 71.6 kg     Body mass index is 22.69 kg/m.  General:  Well nourished, well developed, in no acute distress HEENT: normal Neck: JVP~10cm Vascular: No carotid bruits; radial pulses 2+ bilaterally Cardiac:  normal S1, S2; RRR with extrasystole appreciated Lungs: Bilateral reduced breath sounds worse on L> R Abd: soft,  nontender, no hepatomegaly  Ext: No lower extremity edema.  Wound appreciated on left lower extremity/shin and  wrapped. Musculoskeletal:  No deformities, BUE and BLE strength normal and equal Skin: warm and dry  Neuro:  No focal abnormalities noted Psych:  Normal affect   EKG:  The EKG was personally reviewed and demonstrates: Sinus tachycardia at 108bpm with ectopy and no acute ST or T changes Telemetry:  Telemetry was personally reviewed and demonstrates: NSR with frequent ectopy  Relevant CV Studies: 03/31/2019 Echo  1. The left ventricle has normal systolic function with an ejection fraction of 60-65%. The cavity size was normal. Left ventricular diastolic parameters were normal.  2. The right ventricle has normal systolic function. The cavity was normal. There is no increase in right ventricular wall thickness.  3. The aorta is normal unless otherwise noted.  10/31/2017 LHC  Mid LM lesion is 80% stenosed.  Mid Cx to Dist Cx lesion is 75% stenosed.  Dist LM to Ost LAD lesion is 75% stenosed.  Prox RCA lesion is 25% stenosed.  Prox LAD lesion is 90% stenosed.  Ost 1st Mrg lesion is 90% stenosed.  1st Diag lesion is 100% stenosed. Conclusion Left heart cath from right groin Left ventriculogram was not performed because of elevated creatinine 80% distal left main 75% ostial LAD 90% mid 100% mid diagonal 1 75% ostial circumflex 90% ostial OM1 75% distal circumflex Small RCA nondominant minimal disease Recommend transfer for possible CABG  Laboratory Data:  High Sensitivity Troponin:   Recent Labs  Lab 03/29/19 2216 04/01/19 1813 04/01/19 1934 04/20/19 1354 04/20/19 2125  TROPONINIHS 13 14 14 9 11      Cardiac EnzymesNo results for input(s): TROPONINI in the last 168 hours. No results for input(s): TROPIPOC in the last 168 hours.  Chemistry Recent Labs  Lab 04/20/19 1354 04/21/19 0326  NA 131* 136  K 3.8 3.2*  CL 93* 104  CO2 18* 21*  GLUCOSE 222* 97   BUN 111* 115*  CREATININE 2.96* 2.61*  CALCIUM 7.6* 7.5*  GFRNONAA 19* 23*  GFRAA 23* 26*  ANIONGAP 20* 11    No results for input(s): PROT, ALBUMIN, AST, ALT, ALKPHOS, BILITOT in the last 168 hours. Hematology Recent Labs  Lab 04/20/19 1354 04/21/19 0326  WBC 10.2 5.9  RBC 1.93* 2.64*  HGB 5.5* 7.5*  HCT 17.0* 22.5*  MCV 88.1 85.2  MCH 28.5 28.4  MCHC 32.4 33.3  RDW 16.9* 16.2*  PLT 190 162   BNPNo results for input(s): BNP, PROBNP in the last 168 hours.  DDimer No results for input(s): DDIMER in the last 168 hours.   Radiology/Studies:  Nm Gi Blood Loss  Result Date: 04/21/2019 CLINICAL DATA:  GI bleed. EXAM: NUCLEAR MEDICINE GASTROINTESTINAL BLEEDING SCAN TECHNIQUE: Sequential abdominal images were obtained following intravenous administration of Tc-59mlabeled red blood cells. RADIOPHARMACEUTICALS:  23.7 mCi Tc-966mertechnetate in-vitro labeled red cells. COMPARISON:  CT abdomen 03/25/2012. FINDINGS: No focal areas of GI bleed noted. Increased activity noted over the perineum most likely penile activity. This did not moved. IMPRESSION: No evidence of GI bleed. Electronically Signed   By: ThMarcello MooresRegister   On: 04/21/2019 09:23   Dg Chest Port 1 View  Result Date: 04/20/2019 CLINICAL DATA:  Shortness of breath and hypotension. Blood in stool today. EXAM: PORTABLE CHEST 1 VIEW COMPARISON:  None. FINDINGS: Small left pleural effusion and mild basilar atelectasis are seen. Lungs otherwise clear. Lungs are emphysematous. No pneumothorax. Heart size is upper normal. The patient is status post CABG. Aortic atherosclerosis noted. IMPRESSION: Small left pleural effusion and mild basilar atelectasis. Emphysema. Atherosclerosis.  Electronically Signed   By: Inge Rise M.D.   On: 04/20/2019 14:07    Assessment and Plan:   Sinus tachycardia with ectopy / palpitations - Patient remains asymptomatic as above. Recent echo as above with EF 60 to 65%.  No significant valvular  abnormality. Telemetry shows NSR to sinus tachycardia with ectopy. EKG sinus tachycardia without acute ST or T changes.  High-sensitivity troponin 9  11 and not consistent with ACS.  --Suspect that elevated rates were due to sinus tachycardia after epi injection during endoscopy procedure.  Consider also elevated rates in setting of known UTI on antibiotics, as well as ongoing GI bleed with recent transfusion for hemoglobin of 5 as contributing to patient's current status.  Patient's rates are currently controlled with patient asymptomatic. --Daily BMET.  As above, escalation of medical therapy has been limited by renal function in the past with known CKDIV.  Replete electrolytes as current hypokalemia. Check Mg. Check TSH. Per IM, IVF administered for hypotension. Reasonable to continue gentle hydration for now but with close monitoring of volume status.     --Once BP stable, could consider low-dose beta-blocker as tolerated given his chronic SOB. Will defer for now given current hypotension. --Will discharge with ZIO monitor to quantify ectopy seen this admission with patient's report of palpitations, as well as to rule out any arrhythmia not captured on telemetry this admission such as PAF.  Plan to follow-up in the office after monitor.  As above, patient has indicated that he would like to establish with [facility] cardiology.  GIB, acute blood loss anemia, h/o chronic anemia --Daily CBC.  S/p transfusion for hemoglobin of 5.  Consider as also contributing to the above.  Known chronic anemia, in addition to most recent acute blood loss anemia.  Reschedule of endoscopy per GI. Continue PPI.  UTI --On contact precautions for Klebsiella UTI as above.  Continue antibiotics.  Consider as contributing to symptoms /worsening renal function.  CKDIV --As above, known renal insufficiency, which has limited medical therapy.  Ischemic Cardiomyopathy s/p CABG 10/2017 --No chest pain since CABG as above. EKG without  acute ST/T changes. HS Tn not consistent with ACS.  Continue medical management. No plan for further ischemic workup this admission with cardiac cath. Follow-up in the office.  HTN --Currently hypotensive. With stable BP, could consider BB as above as tolerated given chronic SOB/COPD.  Defer for now.  HLD --Continue statin.   DM2 --Per IM  COPD --Consider as contributing to SOB.  Previous smoker.    For questions or updates, please contact [place] Please consult [email or web address] for contact info under     Signed, Arvil Chaco, PA-C  04/21/2019 1:16 PM

## 2019-04-21 NOTE — Progress Notes (Signed)
Patient developed severe tachycardia with heart rate up to 170 bpm, followed by epi injection but was given for visible vessel that was seen in the stomach.  Patient was given esmolol 40 mg IV push, now heart rate is 90 bpm, BP stable at 170/70, twelve-lead EKG shows tach with PACs but rhythm strip concerning positive inversions.  Patient had history of CABG, followed by Dr. Clayborn Bigness but wants to change the cardiologist a message Dr. Saunders Revel to see if he can see him.  Appreciate cardiology help patient will be transferred to telemetry for close monitoring.

## 2019-04-21 NOTE — Anesthesia Preprocedure Evaluation (Signed)
Anesthesia Evaluation  Patient identified by MRN, date of birth, ID band Patient awake    Reviewed: Allergy & Precautions, H&P , NPO status , Patient's Chart, lab work & pertinent test results, reviewed documented beta blocker date and time   Airway Mallampati: II   Neck ROM: full    Dental  (+) Poor Dentition   Pulmonary neg pulmonary ROS, pneumonia, resolved, COPD,  COPD inhaler, former smoker,    Pulmonary exam normal        Cardiovascular Exercise Tolerance: Poor hypertension, On Medications + Past MI  negative cardio ROS Normal cardiovascular exam Rhythm:regular Rate:Normal     Neuro/Psych negative neurological ROS  negative psych ROS   GI/Hepatic negative GI ROS, Neg liver ROS,   Endo/Other  negative endocrine ROSdiabetes  Renal/GU Renal diseasenegative Renal ROS  negative genitourinary   Musculoskeletal   Abdominal   Peds  Hematology negative hematology ROS (+)   Anesthesia Other Findings Past Medical History: No date: Bladder cancer ([place]) No date: COPD (chronic obstructive pulmonary disease) ([facility]) No date: Hyperlipidemia No date: Hypertension No date: Kidney disease No date: Type 2 diabetes mellitus ([place]) Past Surgical History: No date: COLONOSCOPY 10/31/2017: LEFT HEART CATH AND CORONARY ANGIOGRAPHY; N/A     Comment:  Procedure: LEFT HEART CATH AND CORONARY ANGIOGRAPHY;                Surgeon: Yolonda Kida, MD;  Location: [place]              CV LAB;  Service: Cardiovascular;  Laterality: N/A; No date: REVISION UROSTOMY CUTANEOUS BMI    Body Mass Index: 22.69 kg/m     Reproductive/Obstetrics negative OB ROS                             Anesthesia Physical Anesthesia Plan  ASA: III and emergent  Anesthesia Plan: General   Post-op Pain Management:    Induction:   PONV Risk Score and Plan:   Airway Management Planned:   Additional Equipment:    Intra-op Plan:   Post-operative Plan:   Informed Consent: I have reviewed the patients History and Physical, chart, labs and discussed the procedure including the risks, benefits and alternatives for the proposed anesthesia with the patient or authorized representative who has indicated his/her understanding and acceptance.     Dental Advisory Given  Plan Discussed with: CRNA  Anesthesia Plan Comments:         Anesthesia Quick Evaluation

## 2019-04-21 NOTE — Anesthesia Postprocedure Evaluation (Signed)
Anesthesia Post Note  Patient: Bryan Brewer  Procedure(s) Performed: ESOPHAGOGASTRODUODENOSCOPY (EGD) WITH PROPOFOL (N/A )  Patient location during evaluation: PACU Anesthesia Type: General Level of consciousness: awake and alert Pain management: pain level controlled Vital Signs Assessment: post-procedure vital signs reviewed and stable Respiratory status: spontaneous breathing, nonlabored ventilation, respiratory function stable and patient connected to nasal cannula oxygen Cardiovascular status: blood pressure returned to baseline and stable Postop Assessment: no apparent nausea or vomiting Anesthetic complications: no     Last Vitals:  Vitals:   04/21/19 1118 04/21/19 1119  BP: (!) 107/56 (!) 107/56  Pulse:    Resp: 16   Temp:    SpO2: 100%     Last Pain:  Vitals:   04/21/19 1159  TempSrc:   PainSc: 4                  Molli Barrows

## 2019-04-21 NOTE — Progress Notes (Signed)
RN informed on call hospitalist, Rufina Falco, NP of [facility] 7.5 and BP 94/43 - no new orders received, continue to monitor/assess.

## 2019-04-21 NOTE — Op Note (Signed)
[facility] Gastroenterology Patient Name: Bryan Brewer Procedure Date: 04/21/2019 10:41 AM MRN: [id number] Account #: [id number] Date of Birth: 1941-06-24 Admit Type: Inpatient Age: 78 Room: [facility] ENDO ROOM 1 Gender: Male Note Status: Finalized Procedure:            Upper GI endoscopy Indications:          Hematemesis Providers:            Jonathon Bellows MD, MD Referring MD:         No Local Md, MD (Referring MD) Medicines:            Monitored Anesthesia Care Complications:        Arrhythmia Procedure:            Pre-Anesthesia Assessment:                       - Prior to the procedure, a History and Physical was                        performed, and patient medications, allergies and                        sensitivities were reviewed. The patient's tolerance of                        previous anesthesia was reviewed.                       - The risks and benefits of the procedure and the                        sedation options and risks were discussed with the                        patient. All questions were answered and informed                        consent was obtained.                       - ASA Grade Assessment: III - A patient with severe                        systemic disease.                       After obtaining informed consent, the endoscope was                        passed under direct vision. Throughout the procedure,                        the patient's blood pressure, pulse, and oxygen                        saturations were monitored continuously. The Endoscope                        was introduced through the mouth, and advanced to the  third part of duodenum. The upper GI endoscopy was                        accomplished with ease. The patient tolerated the                        procedure poorly due to the patient's cardiovascular                        instability (arrhythmia). Findings:      The examined duodenum  was normal.      The stomach was normal.      The examined duodenum was normal.      The esophagus was normal.      A Dieulafoy lesion with no bleeding and stigmata of recent bleeding was       found in the gastric fundus. large visibile vessel seeb Area was       successfully injected with 4 mL of a 1:10,000 solution of epinephrine       for drug delivery. after injecting the 4th cc of epinephrine he       developed tachycardia with rate >150/min . the scope was pulled out       right away Impression:           - Normal examined duodenum.                       - Normal stomach.                       - Normal examined duodenum.                       - Normal esophagus.                       OTHER                       - No specimens collected. Recommendation:       - - Observe patient with cardiac monitoring. I have                        discussedf with Dr Vianne Bulls                       - NPO [Duration].                       [name] nue IV PPI for 72 hours                       Wioll need cardiac evaluation - it is possible the                        epinephrine injected for hemostasis precipitated the                        tachyarrythmia . Repeat EGD once seen and cleared by                        cardiology  Monitor CBC and transfuse as needed Procedure Code(s):    --- Professional ---                       [phone], Esophagogastroduodenoscopy, flexible, transoral;                        with directed submucosal injection(s), any substance Diagnosis Code(s):    --- Professional ---                       K92.0, Hematemesis CPT copyright 2019 [employer]. All rights reserved. The codes documented in this report are preliminary and upon coder review may  be revised to meet current compliance requirements. Jonathon Bellows, MD Jonathon Bellows MD, MD 04/21/2019 11:06:34 AM This report has been signed electronically. Number of Addenda: 0 Note Initiated On:  04/21/2019 10:41 AM Estimated Blood Loss: Estimated blood loss: none.      [facility]

## 2019-04-21 NOTE — Progress Notes (Signed)
   04/21/19 1200  Clinical Encounter Type  Visited With Family  Visit Type Initial  Referral From Physician  Consult/Referral To Chaplain  Stress Factors  Family Stress Factors Health changes   Chaplain received an OR to complete or update an AD. Upon arrival, the patient was out of the room for a procedure, but his wife was waiting in the room for his return. The patient's wife confirmed that the patient has HPOA documents, but thanked this chaplain for offering the resource.The chaplain talked with the patient's wife about the history of present illness. She reported that she is concerned about his health and continues to have faith that he will improve. This chaplain provided support in the form of active and reflective listening and  compassionate ministerial presence.

## 2019-04-21 NOTE — Progress Notes (Signed)
[place] [facility] [place] NAME: Bryan Brewer    MR#:  [id number]  DATE OF BIRTH:  1940-12-10  SUBJECTIVE: Patient is admitted for severe anemia with hematemesis, black stool, hemoglobin on admission 5.5, received 2 units of blood transfusion, today it is 7.5, patient will go for EGD.  He says that he takes only aspirin on daily basis.  Does not take any blood thinners.  CHIEF COMPLAINT:   Chief Complaint  Patient presents with  . Blood In Stools    REVIEW OF SYSTEMS:   ROS CONSTITUTIONAL: Fatigue, weakness, recently diagnosed with UTI. EYES: No blurred or double vision.  EARS, NOSE, AND THROAT: No tinnitus or ear pain.  RESPIRATORY: No cough, shortness of breath, wheezing or hemoptysis.  CARDIOVASCULAR: No chest pain, orthopnea, edema.  GASTROINTESTINAL: No nausea, vomiting, diarrhea or abdominal pain.  GENITOURINARY: No dysuria, hematuria.  ENDOCRINE: No polyuria, nocturia,  HEMATOLOGY: No anemia, easy bruising or bleeding SKIN: No rash or lesion. MUSCULOSKELETAL: No joint pain or arthritis.   NEUROLOGIC: No tingling, numbness, weakness.  PSYCHIATRY: No anxiety or depression.   DRUG ALLERGIES:  No Known Allergies  VITALS:  Blood pressure (!) 94/43, pulse 80, temperature 97.8 F (36.6 C), temperature source Oral, resp. rate 20, height 5\' 7"  (1.702 m), weight 65.8 kg, SpO2 97 %.  PHYSICAL EXAMINATION:  GENERAL:  78 y.o.-year-old patient lying in the bed with no acute distress.  Appears pale. EYES: Pupils equal, round, reactive to light . No scleral icterus. Extraocular muscles intact.  HEENT: Head atraumatic, normocephalic. Oropharynx and nasopharynx clear.  NECK:  Supple, no jugular venous distention. No thyroid enlargement, no tenderness.  LUNGS: Normal breath sounds bilaterally, no wheezing, rales,rhonchi or crepitation. No use of accessory muscles of respiration.  CARDIOVASCULAR: S1, S2 normal. No murmurs, rubs, or gallops.   ABDOMEN: Soft, nontender, nondistended. Bowel sounds present. No organomegaly or mass.  EXTREMITIES: No pedal edema, cyanosis, or clubbing.  NEUROLOGIC: Cranial nerves II through XII are intact. Muscle strength 5/5 in all extremities. Sensation intact. Gait not checked.  PSYCHIATRIC: The patient is alert and oriented x 3.  SKIN: No obvious rash, lesion, or ulcer.    LABORATORY PANEL:   CBC Recent Labs  Lab 04/21/19 0326  WBC 5.9  HGB 7.5*  HCT 22.5*  PLT 162   ------------------------------------------------------------------------------------------------------------------  Chemistries  Recent Labs  Lab 04/21/19 0326  NA 136  K 3.2*  CL 104  CO2 21*  GLUCOSE 97  BUN 115*  CREATININE 2.61*  CALCIUM 7.5*   ------------------------------------------------------------------------------------------------------------------  Cardiac Enzymes No results for input(s): TROPONINI in the last 168 hours. ------------------------------------------------------------------------------------------------------------------  RADIOLOGY:  Nm Gi Blood Loss  Result Date: 04/21/2019 CLINICAL DATA:  GI bleed. EXAM: NUCLEAR MEDICINE GASTROINTESTINAL BLEEDING SCAN TECHNIQUE: Sequential abdominal images were obtained following intravenous administration of Tc-22m labeled red blood cells. RADIOPHARMACEUTICALS:  23.7 mCi Tc-3m pertechnetate in-vitro labeled red cells. COMPARISON:  CT abdomen 03/25/2012. FINDINGS: No focal areas of GI bleed noted. Increased activity noted over the perineum most likely penile activity. This did not moved. IMPRESSION: No evidence of GI bleed. Electronically Signed   By: Marcello Moores  Register   On: 04/21/2019 09:23   Dg Chest Port 1 View  Result Date: 04/20/2019 CLINICAL DATA:  Shortness of breath and hypotension. Blood in stool today. EXAM: PORTABLE CHEST 1 VIEW COMPARISON:  None. FINDINGS: Small left pleural effusion and mild basilar atelectasis are seen. Lungs otherwise  clear. Lungs are emphysematous. No pneumothorax. Heart size is  upper normal. The patient is status post CABG. Aortic atherosclerosis noted. IMPRESSION: Small left pleural effusion and mild basilar atelectasis. Emphysema. Atherosclerosis. Electronically Signed   By: Inge Rise M.D.   On: 04/20/2019 14:07    EKG:   Orders placed or performed during the hospital encounter of 04/20/19  . EKG 12-Lead  . EKG 12-Lead  . ED EKG  . ED EKG    ASSESSMENT AND PLAN:   78 year old male with history of, COPD, hypertension, hyperlipidemia, diabetes mellitus type 2, CKD stage III comes in because of hematemesis, melena and is admitted for GI bleed. #1.  Acute blood loss anemia with hemoglobin was 5.5 improved to 7.5 after treatment of blood transfusion, acute blood loss anemia due to GI bleed.  2.  GI bleed likely upper GI bleed, patient is scheduled for EGD this afternoon, continue Protonix drip, continue n.p.o., appreciate GI following.  3.  History of bladder cancer, followed by [facility] urology.  4.  History of CKD stage 4: Stable seen by nephrology during last admission.  Creatinine is 2.61 which is his baseline.    5.  Diabetes mellitus type 2, continue n.p.o., sliding scale insulin with coverage   6.  History of recent UTI, urine cultures are showing Klebsiella, [facility], because he is n.p.o. we will give imipenem, continue to hold Keflex.    #7 recently was admitted and discharged last month.   8.  History of bladder cancer with ileal conduit urostomy, has chronic bilateral hydronephrosis, patient was seen by [facility] urology last time, patient followed by [facility] urology for several years.  Patient is advised to continue to follow-up with [facility] urology for surveillance of bilateral hydronephrosis.  #9.  Hypokalemia, replace potassium.   All the records are reviewed and case discussed with Care Management/Social Workerr. Management plans discussed with the patient, family and they  are in agreement.  CODE STATUS: Full code TOTAL TIME TAKING CARE OF THIS PATIENT: 38 minutes.   POSSIBLE D/C IN 1-2 DAYS, DEPENDING ON CLINICAL CONDITION.   Epifanio Lesches M.D on 04/21/2019 at 10:14 AM  Between 7am to 6pm - Pager - [phone]  After 6pm go to [email or web address] - password [email or web address] [place] [facility]  Office  [phone]  CC: Primary care physician; Glendon Axe, MD   Note: This dictation was prepared with Dragon dictation along with smaller phrase technology. Any transcriptional errors that result from this process are unintentional.

## 2019-04-22 ENCOUNTER — Encounter: Payer: Self-pay | Admitting: Gastroenterology

## 2019-04-22 LAB — CBC
HCT: 23.4 % — ABNORMAL LOW (ref 39.0–52.0)
Hemoglobin: 7.5 g/dL — ABNORMAL LOW (ref 13.0–17.0)
MCH: 29 pg (ref 26.0–34.0)
MCHC: 32.1 g/dL (ref 30.0–36.0)
MCV: 90.3 fL (ref 80.0–100.0)
Platelets: 175 10*3/uL (ref 150–400)
RBC: 2.59 MIL/uL — ABNORMAL LOW (ref 4.22–5.81)
RDW: 17.2 % — ABNORMAL HIGH (ref 11.5–15.5)
WBC: 5.4 10*3/uL (ref 4.0–10.5)
nRBC: 0 % (ref 0.0–0.2)

## 2019-04-22 LAB — HEMATOCRIT: HCT: 29.3 % — ABNORMAL LOW (ref 39.0–52.0)

## 2019-04-22 LAB — BASIC METABOLIC PANEL
Anion gap: 9 (ref 5–15)
BUN: 84 mg/dL — ABNORMAL HIGH (ref 8–23)
CO2: 21 mmol/L — ABNORMAL LOW (ref 22–32)
Calcium: 7.9 mg/dL — ABNORMAL LOW (ref 8.9–10.3)
Chloride: 118 mmol/L — ABNORMAL HIGH (ref 98–111)
Creatinine, Ser: 2.35 mg/dL — ABNORMAL HIGH (ref 0.61–1.24)
GFR calc Af Amer: 30 mL/min — ABNORMAL LOW (ref >60)
GFR calc non Af Amer: 26 mL/min — ABNORMAL LOW (ref >60)
Glucose, Bld: 101 mg/dL — ABNORMAL HIGH (ref 70–99)
Potassium: 3.7 mmol/L (ref 3.5–5.1)
Sodium: 148 mmol/L — ABNORMAL HIGH (ref 135–145)

## 2019-04-22 LAB — HEMOGLOBIN A1C
Hgb A1c MFr Bld: 6.5 % — ABNORMAL HIGH (ref 4.8–5.6)
Mean Plasma Glucose: 140 mg/dL

## 2019-04-22 LAB — HEMOGLOBIN: Hemoglobin: 9.5 g/dL — ABNORMAL LOW (ref 13.0–17.0)

## 2019-04-22 LAB — GLUCOSE, CAPILLARY
Glucose-Capillary: 89 mg/dL (ref 70–99)
Glucose-Capillary: 121 mg/dL — ABNORMAL HIGH (ref 70–99)
Glucose-Capillary: 93 mg/dL (ref 70–99)
Glucose-Capillary: 86 mg/dL (ref 70–99)

## 2019-04-22 LAB — PREPARE RBC (CROSSMATCH)

## 2019-04-22 MED ORDER — SODIUM CHLORIDE 0.9% IV SOLUTION: Freq: Once | INTRAVENOUS | Status: DC

## 2019-04-22 NOTE — Progress Notes (Signed)
Bryan Darby, MD [address]  [place]  [place], [place] [address]  Main: [phone]  Fax: [phone] Pager: [phone]   Subjective: No acute events overnight.  Patient has been hemodynamically stable.  He did not have further episodes of melena or hematemesis since admission.  Hemoglobin 7.5 today.  Patient's wife is bedside.  Patient lying in bed comfortably, nodding his head only Has been kept n.p.o.   Objective: Vital signs in last 24 hours: Vitals:   04/21/19 1719 04/21/19 2015 04/22/19 0330 04/22/19 0756  BP:  (!) 96/51 95/63 (!) 111/59  Pulse:  90 91 88  Resp:  _0 Temp:  97.8 F (36.6 C) 98.7 F (37.1 C) 98.2 F (36.8 C)  TempSrc:  Oral Oral Oral  SpO2:  99% 100% 99%  Weight: 65.3 kg     Height: _1  (1.702 m)      Weight change: -0.071 kg  Intake/Output Summary (Last 24 hours) at 04/22/2019 1534 Last data filed at 04/22/2019 1421 Gross per 24 hour  Intake 1075.13 ml  Output 2350 ml  Net -1274.87 ml     Exam: Heart:: Regular rate and rhythm  Lungs: normal and clear to auscultation Abdomen: soft, nontender, normal bowel sounds   Lab Results: CBC Latest Ref Rng & Units 04/22/2019 04/21/2019 04/20/2019  WBC 4.0 - 10.5 K/uL 5.4 5.9 10.2  Hemoglobin 13.0 - 17.0 g/dL 7.5(L) 7.5(L) 5.5(L)  Hematocrit 39.0 - 52.0 % 23.4(L) 22.5(L) 17.0(L)  Platelets 150 - 400 K/uL 175 162 190   CMP Latest Ref Rng & Units 04/22/2019 04/21/2019 04/20/2019  Glucose 70 - 99 mg/dL 101(H) 97 222(H)  BUN 8 - 23 mg/dL 84(H) 115(H) 111(H)  Creatinine 0.61 - 1.24 mg/dL 2.35(H) 2.61(H) 2.96(H)  Sodium 135 - 145 mmol/L 148(H) 136 131(L)  Potassium 3.5 - 5.1 mmol/L 3.7 3.2(L) 3.8  Chloride 98 - 111 mmol/L 118(H) 104 93(L)  CO2 22 - 32 mmol/L 21(L) 21(L) 18(L)  Calcium 8.9 - 10.3 mg/dL 7.9(L) 7.5(L) 7.6(L)  Total Protein 6.5 - 8.1 g/dL - - -  Total Bilirubin 0.3 - 1.2 mg/dL - - -  Alkaline Phos 38 - 126 U/L - - -  AST 15 - 41 U/L - - -  ALT 0 - 44 U/L - - -     Micro Results: Recent Results (from the past 240 hour(s))  SARS Coronavirus 2 [facility] order, Performed in [facility] hospital lab) Nasopharyngeal Nasopharyngeal Swab     Status: None   Collection Time: 04/20/19  2:32 PM   Specimen: Nasopharyngeal Swab  Result Value Ref Range Status   SARS Coronavirus 2 NEGATIVE NEGATIVE Final    Comment: (NOTE) If result is NEGATIVE SARS-CoV-2 target nucleic acids are NOT DETECTED. The SARS-CoV-2 RNA is generally detectable in upper and lower  respiratory specimens during the acute phase of infection. The lowest  concentration of SARS-CoV-2 viral copies this assay can detect is 250  copies / mL. A negative result does not preclude SARS-CoV-2 infection  and should not be used as the sole basis for treatment or other  patient management decisions.  A negative result may occur with  improper specimen collection / handling, submission of specimen other  than nasopharyngeal swab, presence of viral mutation(s) within the  areas targeted by this assay, and inadequate number of viral copies  (<250 copies / mL). A negative result must be combined with clinical  observations, patient history, and epidemiological information. If result is POSITIVE  SARS-CoV-2 target nucleic acids are DETECTED. The SARS-CoV-2 RNA is generally detectable in upper and lower  respiratory specimens dur ing the acute phase of infection.  Positive  results are indicative of active infection with SARS-CoV-2.  Clinical  correlation with patient history and other diagnostic information is  necessary to determine patient infection status.  Positive results do  not rule out bacterial infection or co-infection with other viruses. If result is PRESUMPTIVE POSTIVE SARS-CoV-2 nucleic acids MAY BE PRESENT.   A presumptive positive result was obtained on the submitted specimen  and confirmed on repeat testing.  While 2019 novel coronavirus  (SARS-CoV-2) nucleic acids may be present in the  submitted sample  additional confirmatory testing may be necessary for epidemiological  and / or clinical management purposes  to differentiate between  SARS-CoV-2 and other Sarbecovirus currently known to infect humans.  If clinically indicated additional testing with an alternate test  methodology [phone]) is advised. The SARS-CoV-2 RNA is generally  detectable in upper and lower respiratory sp ecimens during the acute  phase of infection. The expected result is Negative. Fact Sheet for Patients:  StrictlyIdeas.no Fact Sheet for Healthcare Providers: BankingDealers.co.za This test is not yet approved or cleared by the [place] FDA and has been authorized for detection and/or diagnosis of SARS-CoV-2 by FDA under an Emergency Use Authorization (EUA).  This EUA will remain in effect (meaning this test can be used) for the duration of the COVID-19 declaration under Section 564(b)(1) of the Act, 21 U.S.C. section 360bbb-3(b)(1), unless the authorization is terminated or revoked sooner. Performed at [facility], [address] [place], [place] [address]    Studies/Results: Nm Gi Blood Loss  Result Date: 04/21/2019 CLINICAL DATA:  GI bleed. EXAM: NUCLEAR MEDICINE GASTROINTESTINAL BLEEDING SCAN TECHNIQUE: Sequential abdominal images were obtained following intravenous administration of Tc-13mlabeled red blood cells. RADIOPHARMACEUTICALS:  23.7 mCi Tc-960mertechnetate in-vitro labeled red cells. COMPARISON:  CT abdomen 03/25/2012. FINDINGS: No focal areas of GI bleed noted. Increased activity noted over the perineum most likely penile activity. This did not moved. IMPRESSION: No evidence of GI bleed. Electronically Signed   By: ThMarcello MooresRegister   On: 04/21/2019 09:23   Medications:  I have reviewed the patient's current medications. Prior to Admission:  Medications Prior to Admission  Medication Sig Dispense Refill Last Dose   . aspirin EC 81 MG tablet Take 81 mg by mouth daily.   04/19/2019 at 0800  . atorvastatin (LIPITOR) 80 MG tablet Take 80 mg by mouth at bedtime.   04/19/2019 at 2000  . blood glucose meter kit and supplies KIT Dispense based on patient and insurance preference. Use up to four times daily as directed. (FOR ICD-9 250.00, 250.01). 1 each 1   . cephALEXin (KEFLEX) 500 MG capsule Take 500 mg by mouth 3 (three) times daily.   04/19/2019 at 2000  . furosemide (LASIX) 40 MG tablet Take 40 mg by mouth daily.   Past Week at Unknown time  . glimepiride (AMARYL) 2 MG tablet Take 2 mg by mouth daily with breakfast.   04/19/2019 at 0800  . guaiFENesin (MUCINEX) 600 MG 12 hr tablet Take 1 tablet (600 mg total) by mouth 2 (two) times daily. 30 tablet 0 Past Week at Unknown time  . ipratropium-albuterol (DUONEB) 0.5-2.5 (3) MG/3ML SOLN Take 3 mLs by nebulization every 6 (six) hours as needed (wheezing, shortness of breath). 360 mL 1 Unknown at PRN  . metoprolol tartrate (LOPRESSOR) 25 MG tablet Take 1  tablet (25 mg total) by mouth 2 (two) times daily. 60 tablet 1 04/21/2019 at 2000  . mometasone-formoterol (DULERA) 200-5 MCG/ACT AERO Inhale 2 puffs into the lungs 2 (two) times daily. 1 g 2 04/19/2019 at 2000  . sodium bicarbonate 650 MG tablet Take 1,300 mg by mouth 3 (three) times daily.   04/19/2019 at 2000   Scheduled: . atorvastatin  80 mg Oral QHS  . cephALEXin  250 mg Oral TID  . insulin aspart  0-9 Units Subcutaneous TID WC  . mometasone-formoterol  2 puff Inhalation BID  . pantoprazole (PROTONIX) IV  40 mg Intravenous Q12H  . sodium bicarbonate  1,300 mg Oral TID   Continuous: . 0.9 % NaCl with KCl 20 mEq / L 50 mL/hr at 04/22/19 1420   [id number] **OR** acetaminophen, HYDROcodone-acetaminophen, ondansetron **OR** ondansetron (ZOFRAN) IV, polyethylene glycol Anti-infectives (From admission, onward)   Start     Dose/Rate Route Frequency Ordered Stop   04/21/19 1400  meropenem (MERREM) 500 mg in  sodium chloride 0.9 % 100 mL IVPB  Status:  Discontinued     500 mg 200 mL/hr over 30 Minutes Intravenous Every 12 hours 04/21/19 1359 04/22/19 0954   04/20/19 1700  cephALEXin (KEFLEX) capsule 250 mg     250 mg Oral 3 times daily 04/20/19 1636     04/20/19 1630  cephALEXin (KEFLEX) capsule 500 mg  Status:  Discontinued     500 mg Oral 3 times daily 04/20/19 1619 04/20/19 1636     Scheduled Meds: . atorvastatin  80 mg Oral QHS  . cephALEXin  250 mg Oral TID  . insulin aspart  0-9 Units Subcutaneous TID WC  . mometasone-formoterol  2 puff Inhalation BID  . pantoprazole (PROTONIX) IV  40 mg Intravenous Q12H  . sodium bicarbonate  1,300 mg Oral TID   Continuous Infusions: . 0.9 % NaCl with KCl 20 mEq / L 50 mL/hr at 04/22/19 1420   PRN Meds:.acetaminophen **OR** acetaminophen, HYDROcodone-acetaminophen, ondansetron **OR** ondansetron (ZOFRAN) IV, polyethylene glycol   Assessment: Active Problems:   GIB (gastrointestinal bleeding)   Sinus tachycardia  Upper GI bleed secondary to Dieulafoy's lesion status post EGD yesterday with injection of epinephrine, no further episodes of active bleeding since endoscopy Patient developed V. tach, evaluated by cardiology postprocedure, cleared for repeat EGD  Plan: Continue IV Protonix 40 mg twice daily Transfuse 1 unit of PRBCs today Monitor hemoglobin closely and transfuse as needed Appreciate cardiology input, will proceed with EGD tomorrow by Dr. Vicente Males Maintain n.p.o. Fluid resuscitation to treat AKI Discussed plan of care with patient's wife who is bedside  Dr. Vicente Males will cover from tomorrow   LOS: 2 days   Laylana Gerwig 04/22/2019, 3:34 PM

## 2019-04-22 NOTE — Telephone Encounter (Signed)
TCM....  Patient is being discharged   Bryan Brewer  Bryan are scheduled to see Thurmond Butts on 10/1   Bryan were seen for  STach   Bryan need to be seen within  7-10 days

## 2019-04-22 NOTE — Telephone Encounter (Signed)
Patient still currently admitted- will follow up tomorrow to see if he was discharge and plan for TCM call at that time.

## 2019-04-22 NOTE — Consult Note (Signed)
[place] Nurse ostomy follow up Patient receiving care in [facility] Stoma type/location: LUQ ileal conduit.  The patient has had the ostomy since 2013. He developed a hernia immediately after the surgery. Stomal assessment/size: 1 inch, slightly oval shaped Peristomal assessment: there is a pinpoint opening at the 11 o'clock hour, and a larger (approximately 1 cm x 1.3 cm) irregularly shaped ulceration with approximately 0.1 cm depth.  The wound bed on this ulcer is pale with striations.  When I took the existing skin barrier off, there was thick yellow drainage on the barrier backing where the larger ulcer is, and a small amount of the same type of drainage oozed from the pinhole opening.  The borders are not violaceous. Treatment options for stomal/peristomal skin: A barrier ring was placed around the stoma with careful attention to cover the ulcerations.  The skin barrier opening was cut to closely fit around the flat, red, moist, stoma.   Output: yellow  Ostomy pouching: 2pc. Which I connected to a bedside drainage bag. Education provided: use of a barrier ring.  Additional supplies placed in his personal belonging bag. Thank you for the consult.  Discussed plan of care with the patient   Val Riles, RN, MSN, [facility], CNS-BC, pager [phone]

## 2019-04-22 NOTE — Progress Notes (Signed)
Max Sane MD said it is ok for patient to take his PO meds with sips of fluid.

## 2019-04-22 NOTE — Progress Notes (Signed)
[place] [facility] [place] NAME: Bryan Brewer    MR#:  [id number]  DATE OF BIRTH:  [birth year]  SUBJECTIVE: Patient is admitted for severe anemia with hematemesis, black stool, hemoglobin on admission 5.5, received 2 units of blood transfusion, hemoglobin improved to 7.5.  Patient developed severe tachycardia after EGD with epinephrine injection yesterday, transferred to telemetry.  Better, continues to be n.p.o. as per gastroenterology recommendation.  CHIEF COMPLAINT:   Chief Complaint  Patient presents with  . Blood In Stools    REVIEW OF SYSTEMS:   Review of Systems  HENT: Positive for hearing loss.    CONSTITUTIONAL: Fatigue, weakness, recently diagnosed with UTI. EYES: No blurred or double vision.  EARS, NOSE, AND THROAT: No tinnitus or ear pain.  RESPIRATORY: No cough, shortness of breath, wheezing or hemoptysis.  CARDIOVASCULAR: No chest pain, orthopnea, edema.  GASTROINTESTINAL: No nausea, vomiting, diarrhea or abdominal pain.  GENITOURINARY: No dysuria, hematuria.  ENDOCRINE: No polyuria, nocturia, urostomy bag present. HEMATOLOGY: No anemia, easy bruising or bleeding SKIN: No rash or lesion. MUSCULOSKELETAL: No joint pain or arthritis.   NEUROLOGIC: No tingling, numbness, weakness.  PSYCHIATRY: No anxiety or depression.   DRUG ALLERGIES:  No Known Allergies  VITALS:  Blood pressure (!) 111/59, pulse 88, temperature 98.2 F (36.8 C), temperature source Oral, resp. rate 18, height 5\' 7"  (1.702 m), weight 65.3 kg, SpO2 99 %.  PHYSICAL EXAMINATION:  GENERAL:  78 y.o.-year-old patient lying in the bed with no acute distress.  Appears comfortable, denies any chest pain or shortness of breath. EYES: Pupils equal, round, reactive to light . No scleral icterus. HEENT: Head atraumatic, normocephalic. Oropharynx and nasopharynx clear.  NECK:  Supple, no jugular venous distention. No thyroid enlargement, no tenderness.  LUNGS:  Normal breath sounds bilaterally, no wheezing, rales,rhonchi or crepitation. No use of accessory muscles of respiration.  CARDIOVASCULAR: S1, S2 normal. No murmurs, rubs, or gallops.  ABDOMEN: Soft, nontender, nondistended. Bowel sounds present. No organomegaly or mass.  EXTREMITIES: No pedal edema, cyanosis, or clubbing.  Urostomy bag present in the left lower quadrant, site looks clean, seen by wound care nurse for stoma care.     NEUROLOGIC: Cranial nerves II through XII are intact. Muscle strength 5/5 in all extremities. Sensation intact. Gait not checked.  PSYCHIATRIC: The patient is alert and oriented x 3.  SKIN: No obvious rash, lesion, or ulcer.    LABORATORY PANEL:   CBC Recent Labs  Lab 04/22/19 0757  WBC 5.4  HGB 7.5*  HCT 23.4*  PLT 175   ------------------------------------------------------------------------------------------------------------------  Chemistries  Recent Labs  Lab 04/21/19 0326 04/22/19 0757  NA 136 148*  K 3.2* 3.7  CL 104 118*  CO2 21* 21*  GLUCOSE 97 101*  BUN 115* 84*  CREATININE 2.61* 2.35*  CALCIUM 7.5* 7.9*  MG 1.8  --    ------------------------------------------------------------------------------------------------------------------  Cardiac Enzymes No results for input(s): TROPONINI in the last 168 hours. ------------------------------------------------------------------------------------------------------------------  RADIOLOGY:  Nm Gi Blood Loss  Result Date: 04/21/2019 CLINICAL DATA:  GI bleed. EXAM: NUCLEAR MEDICINE GASTROINTESTINAL BLEEDING SCAN TECHNIQUE: Sequential abdominal images were obtained following intravenous administration of Tc-36m labeled red blood cells. RADIOPHARMACEUTICALS:  23.7 mCi Tc-24m pertechnetate in-vitro labeled red cells. COMPARISON:  CT abdomen 03/25/2012. FINDINGS: No focal areas of GI bleed noted. Increased activity noted over the perineum most likely penile activity. This did not moved.  IMPRESSION: No evidence of GI bleed. Electronically Signed   By: Marcello Moores  Register  On: 04/21/2019 09:23   Dg Chest Port 1 View  Result Date: 04/20/2019 CLINICAL DATA:  Shortness of breath and hypotension. Blood in stool today. EXAM: PORTABLE CHEST 1 VIEW COMPARISON:  None. FINDINGS: Small left pleural effusion and mild basilar atelectasis are seen. Lungs otherwise clear. Lungs are emphysematous. No pneumothorax. Heart size is upper normal. The patient is status post CABG. Aortic atherosclerosis noted. IMPRESSION: Small left pleural effusion and mild basilar atelectasis. Emphysema. Atherosclerosis. Electronically Signed   By: Inge Rise M.D.   On: 04/20/2019 14:07    EKG:   Orders placed or performed during the hospital encounter of 04/20/19  . EKG 12-Lead  . EKG 12-Lead  . ED EKG  . ED EKG  . EKG 12-Lead  . EKG 12-Lead    ASSESSMENT AND PLAN:   78 year old male with history of, COPD, hypertension, hyperlipidemia, diabetes mellitus type 2, CKD stage III comes in because of hematemesis, melena and is admitted for GI bleed. #1.  Acute blood loss anemia with hemoglobin was 5.5 improved to 7.5 after treatment of blood transfusion, acute blood loss anemia due to GI bleed.  2.  GI bleed status post EGD yesterday s showed Dieulafoy's lesion gastric fundus status post intralesional epinephrine, patient developed tachycardia after that.  Gastroenterology is requesting cardiology clearance for repeat EGD tomorrow.  Patient will be n.p.o. till then and continue IV fluids. Hemoglobin is 7.5 today. 3.  History of bladder cancer, followed by [facility] urology.  Continue urostomy care, seen by wound care nurse.  4.  History of CKD stage 4: Stable seen by nephrology during last admission.  Creatinine is 2.61 which is his baseline.    5.  Diabetes mellitus type 2, continue n.p.o., sliding scale insulin with coverage   6.  History of recent UTI, urine cultures are showing Klebsiella, [facility] care,  continue Keflex, discontinue meropenem, discussed with pharmacy.  #7 .  Sinus tachycardia, echocardiogram showed EF of 60 to 65%, seen by Dr. Delane Ginger, elevated heart rate likely secondary to UTI and also GI bleed.  Patient received esmolol 40 mg 1 time, because of history of CAD, CABG cardiology is recommending Z IO monitor.    8.  History of bladder cancer with ileal conduit urostomy, has chronic bilateral hydronephrosis, patient was seen by [facility] urology last time, patient followed by [facility] urology for several years.  Patient is advised to continue to follow-up with [facility] urology for surveillance of bilateral hydronephrosis.  Patient is on Keflex.  Continue them.  #9.  Hypokalemia, replace potassium. #10 .  Ischemic cardiomyopathy status post CABG in 2019, no chest pain, continue medical management. #11 COPD, continue home dose inhalers.   All the records are reviewed and case discussed with Care Management/Social Workerr. Management plans discussed with the patient, family and they are in agreement.  CODE STATUS: Full code TOTAL TIME TAKING CARE OF THIS PATIENT: 38 minutes.  More than 50% time spent in counseling, coordination of care. POSSIBLE D/C IN 1-2 DAYS, DEPENDING ON CLINICAL CONDITION.   Epifanio Lesches M.D on 04/22/2019 at 1:59 PM  Between 7am to 6pm - Pager - [phone]  After 6pm go to [email or web address] - password [email or web address] [facility]  Office  [phone]  CC: Primary care physician; Glendon Axe, MD   Note: This dictation was prepared with Dragon dictation along with smaller phrase technology. Any transcriptional errors that result from this process are unintentional.

## 2019-04-23 ENCOUNTER — Inpatient Hospital Stay: Payer: Medicare Other | Admitting: Anesthesiology

## 2019-04-23 ENCOUNTER — Encounter
Admission: EM | Disposition: A | Discharge status: Home / Self Care | Payer: Self-pay | Source: Home / Self Care | Attending: Internal Medicine

## 2019-04-23 ENCOUNTER — Encounter: Payer: Self-pay | Admitting: *Deleted

## 2019-04-23 HISTORY — PX: ESOPHAGOGASTRODUODENOSCOPY: SHX5428

## 2019-04-23 LAB — TYPE AND SCREEN
ABO/RH(D): A POS
Antibody Screen: NEGATIVE
Unit division: 0
Unit division: 0
Unit division: 0

## 2019-04-23 LAB — BPAM RBC
Blood Product Expiration Date: 202009282359
Blood Product Expiration Date: 202010042359
Blood Product Expiration Date: 202010172359
ISSUE DATE / TIME: 202009141410
ISSUE DATE / TIME: 202009141410
ISSUE DATE / TIME: 202009161705
Unit Type and Rh: 5100
Unit Type and Rh: 5100
Unit Type and Rh: 6200

## 2019-04-23 LAB — GLUCOSE, CAPILLARY
Glucose-Capillary: 153 mg/dL — ABNORMAL HIGH (ref 70–99)
Glucose-Capillary: 227 mg/dL — ABNORMAL HIGH (ref 70–99)
Glucose-Capillary: 170 mg/dL — ABNORMAL HIGH (ref 70–99)
Glucose-Capillary: 115 mg/dL — ABNORMAL HIGH (ref 70–99)

## 2019-04-23 SURGERY — EGD (ESOPHAGOGASTRODUODENOSCOPY)
Anesthesia: General

## 2019-04-23 MED ORDER — PROPOFOL 500 MG/50ML IV EMUL
INTRAVENOUS | Status: DC | PRN
  Administered 2019-04-23: 140 ug/kg/min via INTRAVENOUS

## 2019-04-23 MED ORDER — PROPOFOL 10 MG/ML IV BOLUS
INTRAVENOUS | Status: DC | PRN
  Administered 2019-04-23: 20 mg via INTRAVENOUS
  Administered 2019-04-23: 60 mg via INTRAVENOUS
  Administered 2019-04-23: 20 mg via INTRAVENOUS

## 2019-04-23 MED ORDER — SODIUM CHLORIDE 0.9 % IV SOLN: INTRAVENOUS | Status: DC

## 2019-04-23 NOTE — Op Note (Signed)
[facility] Gastroenterology Patient Name: Bryan Brewer Procedure Date: 04/23/2019 10:23 AM MRN: [id number] Account #: [id number] Date of Birth: 08/07/1940 Admit Type: Inpatient Age: 78 Room: [facility] ENDO ROOM 3 Gender: Male Note Status: Finalized Procedure:            Upper GI endoscopy Indications:          Melena Providers:            Jonathon Bellows MD, MD Medicines:            Monitored Anesthesia Care Complications:        No immediate complications. Procedure:            Pre-Anesthesia Assessment:                       - Prior to the procedure, a History and Physical was                        performed, and patient medications, allergies and                        sensitivities were reviewed. The patient's tolerance of                        previous anesthesia was reviewed.                       - The risks and benefits of the procedure and the                        sedation options and risks were discussed with the                        patient. All questions were answered and informed                        consent was obtained.                       - ASA Grade Assessment: II - A patient with mild                        systemic disease.                       After obtaining informed consent, the endoscope was                        passed under direct vision. Throughout the procedure,                        the patient's blood pressure, pulse, and oxygen                        saturations were monitored continuously. The Endoscope                        was introduced through the mouth, and advanced to the                        third part of duodenum. The upper GI endoscopy was  accomplished with ease. The patient tolerated the                        procedure well. Findings:      The esophagus was normal.      The examined duodenum was normal.      A Dieulafoy lesion with no bleeding and stigmata of recent bleeding was   found in the gastric fundus. To prevent bleeding post-intervention,       three hemostatic clips were successfully placed. There was no bleeding       during, or at the end, of the procedure. Impression:           - Normal esophagus.                       - Normal examined duodenum.                       - Dieulafoy lesion of stomach.                       - No specimens collected. Recommendation:       - Return patient to hospital ward for ongoing care.                       - Can commen ce on clears and gradually advance.                       complete 72 hours IV PPI GTT then home on omeprazole 40                        mg for 6 weeks                       Monitor CBC and trransfuse as needed .                       If has further bleeding will need intervention by                        vascular surgery Procedure Code(s):    --- Professional ---                       [phone], Esophagogastroduodenoscopy, flexible, transoral;                        diagnostic, including collection of specimen(s) by                        brushing or washing, when performed (separate procedure) Diagnosis Code(s):    --- Professional ---                       W29.56, Dieulafoy lesion (hemorrhagic) of stomach and                        duodenum                       K92.1, Melena (includes Hematochezia) CPT copyright 2019 American Medical Association. All rights reserved. The codes documented in this report are preliminary and upon coder review may  be revised to  meet current compliance requirements. Jonathon Bellows, MD Jonathon Bellows MD, MD 04/23/2019 10:34:37 AM This report has been signed electronically. Number of Addenda: 0 Note Initiated On: 04/23/2019 10:23 AM Estimated Blood Loss: Estimated blood loss: none.      [facility]

## 2019-04-23 NOTE — H&P (Signed)
Jonathon Bellows, MD [address], [place], [place], [place], [address] [address], [place], [place], [place], [address] Phone: [phone]  Fax: [phone]  Primary Care Physician:  Glendon Axe, MD   Pre-Procedure History & Physical: HPI:  Bryan Brewer is a 78 y.o. male is here for an endoscopy    Past Medical History:  Diagnosis Date  . Bladder cancer ([place])   . COPD (chronic obstructive pulmonary disease) ([place])   . Hyperlipidemia   . Hypertension   . Kidney disease   . Type 2 diabetes mellitus ([place])     Past Surgical History:  Procedure Laterality Date  . COLONOSCOPY    . ESOPHAGOGASTRODUODENOSCOPY (EGD) WITH PROPOFOL N/A 04/21/2019   Procedure: ESOPHAGOGASTRODUODENOSCOPY (EGD) WITH PROPOFOL;  Surgeon: Jonathon Bellows, MD;  Location: [facility] ENDOSCOPY;  Service: Gastroenterology;  Laterality: N/A;  . LEFT HEART CATH AND CORONARY ANGIOGRAPHY N/A 10/31/2017   Procedure: LEFT HEART CATH AND CORONARY ANGIOGRAPHY;  Surgeon: Yolonda Kida, MD;  Location: [facility] CV LAB;  Service: Cardiovascular;  Laterality: N/A;  . REVISION UROSTOMY CUTANEOUS      Prior to Admission medications   Medication Sig Start Date End Date Taking? Authorizing Provider  aspirin EC 81 MG tablet Take 81 mg by mouth daily.   Yes [provider]  atorvastatin (LIPITOR) 80 MG tablet Take 80 mg by mouth at bedtime.   Yes [provider]  blood glucose meter kit and supplies KIT Dispense based on patient and insurance preference. Use up to four times daily as directed. (FOR ICD-9 250.00, 250.01). 04/04/19  Yes Gladstone Lighter, MD  cephALEXin (KEFLEX) 500 MG capsule Take 500 mg by mouth 3 (three) times daily. 04/16/19 04/26/19 Yes [provider]  furosemide (LASIX) 40 MG tablet Take 40 mg by mouth daily.   Yes [provider]  glimepiride (AMARYL) 2 MG tablet Take 2 mg by mouth daily with breakfast.   Yes [provider]  guaiFENesin (MUCINEX) 600 MG  12 hr tablet Take 1 tablet (600 mg total) by mouth 2 (two) times daily. 04/04/19  Yes Gladstone Lighter, MD  ipratropium-albuterol (DUONEB) 0.5-2.5 (3) MG/3ML SOLN Take 3 mLs by nebulization every 6 (six) hours as needed (wheezing, shortness of breath). 04/04/19  Yes Gladstone Lighter, MD  metoprolol tartrate (LOPRESSOR) 25 MG tablet Take 1 tablet (25 mg total) by mouth 2 (two) times daily. 04/04/19  Yes Gladstone Lighter, MD  mometasone-formoterol (DULERA) 200-5 MCG/ACT AERO Inhale 2 puffs into the lungs 2 (two) times daily. 04/04/19  Yes Gladstone Lighter, MD  sodium bicarbonate 650 MG tablet Take 1,300 mg by mouth 3 (three) times daily.   Yes [provider]    Allergies as of 04/20/2019  . (No Known Allergies)    Family History  Problem Relation Age of Onset  . CAD Mother   . Stroke Mother   . CAD Father   . Stroke Father   . Breast cancer Sister     Social History   Socioeconomic History  . Marital status: Married    Spouse name: Not on file  . Number of children: Not on file  . Years of education: Not on file  . Highest education level: Not on file  Occupational History  . Not on file  Social Needs  . Financial resource strain: Not hard at all  . Food insecurity    Worry: Patient refused    Inability: Patient refused  . Transportation needs    Medical:  Patient refused    Non-medical: Patient refused  Tobacco Use  . Smoking status: Former Smoker    Packs/day: 1.50    Years: 35.00    Pack years: 52.50    Types: Cigarettes    Quit date: 2013    Years since quitting: 7.7  . Smokeless tobacco: Never Used  Substance and Sexual Activity  . Alcohol use: No    Frequency: Never  . Drug use: Never  . Sexual activity: Not Currently  Lifestyle  . Physical activity    Days per week: Patient refused    Minutes per session: Patient refused  . Stress: Patient refused  Relationships  . Social Herbalist on phone: Patient refused    Gets together:  Patient refused    Attends religious service: Patient refused    Active member of club or organization: Patient refused    Attends meetings of clubs or organizations: Patient refused    Relationship status: Patient refused  . Intimate partner violence    Fear of current or ex partner: Patient refused    Emotionally abused: Patient refused    Physically abused: Patient refused    Forced sexual activity: Patient refused  Other Topics Concern  . Not on file  Social History Narrative  . Not on file    Review of Systems: See HPI, otherwise negative ROS  Physical Exam: BP 134/72   Pulse 93   Temp 98.6 F (37 C) (Tympanic)   Resp 18   Ht 5' 7"  (1.702 m)   Wt 65.8 kg   SpO2 100%   BMI 22.71 kg/m  General:   Alert,  pleasant and cooperative in NAD Head:  Normocephalic and atraumatic. Neck:  Supple; no masses or thyromegaly. Lungs:  Clear throughout to auscultation, normal respiratory effort.    Heart:  +S1, +S2, Regular rate and rhythm, No edema. Abdomen:  Soft, nontender and nondistended. Normal bowel sounds, without guarding, and without rebound.   Neurologic:  Alert and  oriented x4;  grossly normal neurologically.  Impression/Plan: Bryan Brewer is here for an endoscopy  to be performed for  evaluation of dieulefoy lesion of the stomach    Risks, benefits, limitations, and alternatives regarding endoscopy have been reviewed with the patient.  Questions have been answered.  All parties agreeable.   Jonathon Bellows, MD  04/23/2019, 10:19 AM

## 2019-04-23 NOTE — Progress Notes (Signed)
[place] [facility] [place] NAME: Bryan Brewer    MR#:  [id number]  DATE OF BIRTH:  [birth year]  SUBJECTIVE:  status post EGD again this morning, will fibrillation that was found by EGD 2 days ago was clipped today.  Patient has no abdominal pain.  CHIEF COMPLAINT:   Chief Complaint  Patient presents with  . Blood In Stools    REVIEW OF SYSTEMS:   Review of Systems  HENT: Positive for hearing loss.    CONSTITUTIONAL: Fatigue, weakness, recently diagnosed with UTI. EYES: No blurred or double vision.  EARS, NOSE, AND THROAT: No tinnitus or ear pain.  RESPIRATORY: No cough, shortness of breath, wheezing or hemoptysis.  CARDIOVASCULAR: No chest pain, orthopnea, edema.  GASTROINTESTINAL: No nausea, vomiting, diarrhea or abdominal pain.  GENITOURINARY: No dysuria, hematuria.  ENDOCRINE: No polyuria, nocturia, urostomy bag present. HEMATOLOGY: No anemia, easy bruising or bleeding SKIN: No rash or lesion. MUSCULOSKELETAL: No joint pain or arthritis.   NEUROLOGIC: No tingling, numbness, weakness.  PSYCHIATRY: No anxiety or depression.   DRUG ALLERGIES:  No Known Allergies  VITALS:  Blood pressure 121/67, pulse (!) 103, temperature 98.4 F (36.9 C), temperature source Oral, resp. rate 18, height 5\' 7"  (1.702 m), weight 65.8 kg, SpO2 98 %.  PHYSICAL EXAMINATION:  GENERAL:  78 y.o.-year-old patient lying in the bed with no acute distress.  Appears comfortable, denies any chest pain or shortness of breath. EYES: Pupils equal, round, reactive to light . No scleral icterus. HEENT: Head atraumatic, normocephalic. Oropharynx and nasopharynx clear.  NECK:  Supple, no jugular venous distention. No thyroid enlargement, no tenderness.  LUNGS: Normal breath sounds bilaterally, no wheezing, rales,rhonchi or crepitation. No use of accessory muscles of respiration.  CARDIOVASCULAR: S1, S2 normal. No murmurs, rubs, or gallops.  ABDOMEN: Soft, nontender,  nondistended. Bowel sounds present. No organomegaly or mass.  EXTREMITIES: No pedal edema, cyanosis, or clubbing.  Urostomy bag present in the left lower quadrant, site looks clean, seen by wound care nurse for stoma care.     NEUROLOGIC: Cranial nerves II through XII are intact. Muscle strength 5/5 in all extremities. Sensation intact. Gait not checked.  PSYCHIATRIC: The patient is alert and oriented x 3.  SKIN: No obvious rash, lesion, or ulcer.    LABORATORY PANEL:   CBC Recent Labs  Lab 04/22/19 0757 04/22/19 2209  WBC 5.4  --   HGB 7.5* 9.5*  HCT 23.4* 29.3*  PLT 175  --    ------------------------------------------------------------------------------------------------------------------  Chemistries  Recent Labs  Lab 04/21/19 0326 04/22/19 0757  NA 136 148*  K 3.2* 3.7  CL 104 118*  CO2 21* 21*  GLUCOSE 97 101*  BUN 115* 84*  CREATININE 2.61* 2.35*  CALCIUM 7.5* 7.9*  MG 1.8  --    ------------------------------------------------------------------------------------------------------------------  Cardiac Enzymes No results for input(s): TROPONINI in the last 168 hours. ------------------------------------------------------------------------------------------------------------------  RADIOLOGY:  No results found.  EKG:   Orders placed or performed during the hospital encounter of 04/20/19  . EKG 12-Lead  . EKG 12-Lead  . ED EKG  . ED EKG  . EKG 12-Lead  . EKG 12-Lead    ASSESSMENT AND PLAN:   78 year old male with history of, COPD, hypertension, hyperlipidemia, diabetes mellitus type 2, CKD stage III comes in because of hematemesis, melena and is admitted for GI bleed.   #1.  Acute blood loss anemia with hemoglobin was 5.5 on admission status post total of 3 units since his last  admission, hemoglobin improved to 9.5, status post EGD 2 times this admission, first EGD was done 2 days ago, Dieulafoy's lesion status epinephrine was injected and patient  developed severe tachycardia so patient was transferred to telemetry, seen by cardio,.  And is kept n.p.o. since admission, had another EGD today Dieulafoy's lesion is clipped today, started on clear liquids   Continue Protonix drip for total of 72 hours, after that continue oral Protonix and see how he does.   3.  History of bladder cancer, followed by [facility] urology.  Continue urostomy care, seen by wound care nurse.  4.  History of CKD stage 4: Stable seen by nephrology during last admission.  Creatinine is 2.61 which is his baseline.    5.  Diabetes mellitus type 2, continue sliding scale insulin with coverage   6.  History of recent UTI, urine cultures are showing Klebsiella, [facility] care, continue Keflex, discontinue meropenem, discussed with pharmacy.  #7 .  Sinus tachycardia, echocardiogram showed EF of 60 to 65%, seen by Dr. Delane Ginger, elevated heart rate likely secondary to UTI and also GI bleed.  Patient received esmolol 40 mg 1 time, because of history of CAD, CABG cardiology is recommending Z IO monitor.    8.  History of bladder cancer with ileal conduit urostomy, has chronic bilateral hydronephrosis, patient was seen by [facility] urology last time, patient followed by [facility] urology for several years.  Patient is advised to continue to follow-up with [facility] urology for surveillance of bilateral hydronephrosis.  Patient is on Keflex.  Continue them.  #9.  Hypokalemia, replace potassium. #10 .  Ischemic cardiomyopathy status post CABG in 2019, no chest pain, continue medical management. #11.  COPD, continue home dose inhalers.   All the records are reviewed and case discussed with Care Management/Social Workerr. Management plans discussed with the patient, family and they are in agreement.  CODE STATUS: Full code TOTAL TIME TAKING CARE OF THIS PATIENT: 38 minutes.  More than 50% time spent in counseling, coordination of care. POSSIBLE D/C IN 1-2 DAYS, DEPENDING ON CLINICAL  CONDITION.   Epifanio Lesches M.D on 04/23/2019 at 12:45 PM  Between 7am to 6pm - Pager - [phone]  After 6pm go to [email or web address] - password [email or web address] [facility]  Office  [phone]  CC: Primary care physician; Glendon Axe, MD   Note: This dictation was prepared with Dragon dictation along with smaller phrase technology. Any transcriptional errors that result from this process are unintentional.

## 2019-04-23 NOTE — Anesthesia Post-op Follow-up Note (Signed)
Anesthesia QCDR form completed.        

## 2019-04-23 NOTE — Anesthesia Preprocedure Evaluation (Signed)
Anesthesia Evaluation  Patient identified by MRN, date of birth, ID band Patient awake    Reviewed: Allergy & Precautions, NPO status , Patient's Chart, lab work & pertinent test results, reviewed documented beta blocker date and time   Airway Mallampati: II  TM Distance: >3 FB     Dental  (+) Chipped   Pulmonary former smoker,           Cardiovascular hypertension, Pt. on medications and Pt. on home beta blockers + Past MI and + CABG       Neuro/Psych    GI/Hepatic   Endo/Other  diabetes, Type 2  Renal/GU CRFRenal disease     Musculoskeletal   Abdominal   Peds  Hematology  (+) anemia ,   Anesthesia Other Findings Hb 7.5. decreased GFR. Last Echo 60-65. EKG shows PACs.  Reproductive/Obstetrics                             Anesthesia Physical Anesthesia Plan  ASA: III  Anesthesia Plan: General   Post-op Pain Management:    Induction: Intravenous  PONV Risk Score and Plan:   Airway Management Planned:   Additional Equipment:   Intra-op Plan:   Post-operative Plan:   Informed Consent: I have reviewed the patients History and Physical, chart, labs and discussed the procedure including the risks, benefits and alternatives for the proposed anesthesia with the patient or authorized representative who has indicated his/her understanding and acceptance.       Plan Discussed with: CRNA  Anesthesia Plan Comments:         Anesthesia Quick Evaluation

## 2019-04-23 NOTE — Progress Notes (Signed)
Pt off the floor for EGD, report given to Dava, RN.

## 2019-04-23 NOTE — Anesthesia Postprocedure Evaluation (Signed)
Anesthesia Post Note  Patient: Bryan Brewer  Procedure(s) Performed: ESOPHAGOGASTRODUODENOSCOPY (EGD) (N/A )  Patient location during evaluation: Endoscopy Anesthesia Type: General Level of consciousness: awake and alert Pain management: pain level controlled Vital Signs Assessment: post-procedure vital signs reviewed and stable Respiratory status: spontaneous breathing, nonlabored ventilation, respiratory function stable and patient connected to nasal cannula oxygen Cardiovascular status: blood pressure returned to baseline and stable Postop Assessment: no apparent nausea or vomiting Anesthetic complications: no     Last Vitals:  Vitals:   04/23/19 1055 04/23/19 1102  BP: 128/74 112/68  Pulse: 97 99  Resp: 18 (!) 21  Temp:    SpO2: 100% 100%    Last Pain:  Vitals:   04/23/19 1102  TempSrc:   PainSc: 0-No pain                 [name]

## 2019-04-23 NOTE — Telephone Encounter (Signed)
Patient currently admitted

## 2019-04-23 NOTE — Transfer of Care (Signed)
Immediate Anesthesia Transfer of Care Note  Patient: Presley Raddle  Procedure(s) Performed: ESOPHAGOGASTRODUODENOSCOPY (EGD) (N/A )  Patient Location: PACU  Anesthesia Type:General  Level of Consciousness: sedated  Airway & Oxygen Therapy: Patient Spontanous Breathing and Patient connected to nasal cannula oxygen  Post-op Assessment: Report given to RN and Post -op Vital signs reviewed and stable  Post vital signs: Reviewed and stable  Last Vitals:  Vitals Value Taken Time  BP 95/56 04/23/19 1036  Temp 36.3 C 04/23/19 1036  Pulse 90 04/23/19 1037  Resp 18 04/23/19 1037  SpO2 99 % 04/23/19 1037  Vitals shown include unvalidated device data.  Last Pain:  Vitals:   04/23/19 1036  TempSrc:   PainSc: 0-No pain         Complications: No apparent anesthesia complications

## 2019-04-24 ENCOUNTER — Encounter: Payer: Self-pay | Admitting: Gastroenterology

## 2019-04-24 DIAGNOSIS — K921 Melena: Secondary | ICD-10-CM

## 2019-04-24 LAB — CBC
HCT: 34.3 % — ABNORMAL LOW (ref 39.0–52.0)
HCT: 30.3 % — ABNORMAL LOW (ref 39.0–52.0)
Hemoglobin: 10.6 g/dL — ABNORMAL LOW (ref 13.0–17.0)
Hemoglobin: 9.9 g/dL — ABNORMAL LOW (ref 13.0–17.0)
MCH: 29.3 pg (ref 26.0–34.0)
MCH: 29.1 pg (ref 26.0–34.0)
MCHC: 30.9 g/dL (ref 30.0–36.0)
MCHC: 32.7 g/dL (ref 30.0–36.0)
MCV: 94.8 fL (ref 80.0–100.0)
MCV: 89.1 fL (ref 80.0–100.0)
Platelets: 182 10*3/uL (ref 150–400)
Platelets: 172 10*3/uL (ref 150–400)
RBC: 3.62 MIL/uL — ABNORMAL LOW (ref 4.22–5.81)
RBC: 3.4 MIL/uL — ABNORMAL LOW (ref 4.22–5.81)
RDW: 19 % — ABNORMAL HIGH (ref 11.5–15.5)
RDW: 18.4 % — ABNORMAL HIGH (ref 11.5–15.5)
WBC: 13.3 10*3/uL — ABNORMAL HIGH (ref 4.0–10.5)
WBC: 14.3 10*3/uL — ABNORMAL HIGH (ref 4.0–10.5)
nRBC: 0 % (ref 0.0–0.2)
nRBC: 0 % (ref 0.0–0.2)

## 2019-04-24 LAB — GLUCOSE, CAPILLARY
Glucose-Capillary: 113 mg/dL — ABNORMAL HIGH (ref 70–99)
Glucose-Capillary: 222 mg/dL — ABNORMAL HIGH (ref 70–99)
Glucose-Capillary: 141 mg/dL — ABNORMAL HIGH (ref 70–99)
Glucose-Capillary: 142 mg/dL — ABNORMAL HIGH (ref 70–99)

## 2019-04-24 MED ORDER — PANTOPRAZOLE SODIUM 40 MG PO TBEC: 40.0000 mg | DELAYED_RELEASE_TABLET | Freq: Every day | ORAL | 1 refills | Status: DC

## 2019-04-24 MED ORDER — POLYETHYLENE GLYCOL 3350 17 G PO PACK
17.0000 g | PACK | Freq: Once | ORAL | Status: DC
  Filled 2019-04-24: qty 1

## 2019-04-24 MED ORDER — METOPROLOL TARTRATE 25 MG PO TABS
25.0000 mg | ORAL_TABLET | Freq: Two times a day (BID) | ORAL | Status: DC
  Administered 2019-04-24 (×2): 25 mg via ORAL
  Filled 2019-04-24 (×2): qty 1

## 2019-04-24 MED ORDER — LOPERAMIDE HCL 2 MG PO CAPS
2.0000 mg | ORAL_CAPSULE | Freq: Once | ORAL | Status: AC
  Administered 2019-04-25: 2 mg via ORAL
  Filled 2019-04-24: qty 1

## 2019-04-24 NOTE — Discharge Summary (Addendum)
Bryan Brewer, is a 78 y.o. male  DOB Sep 06, 1940  MRN [id number].  Admission date:  04/20/2019  Admitting Physician  Bettey Costa, MD  Discharge Date:  04/24/2019   Primary MD  Glendon Axe, MD  Recommendations for primary care physician for things to follow:   With PCP in 1 week Follow-up with gastroenterology Dr. Jonathon Bellows in 2 to 3 weeks   Admission Diagnosis  SOB (shortness of breath) [R06.02] Gastrointestinal hemorrhage, unspecified gastrointestinal hemorrhage type [K92.2] Anemia, unspecified type [D64.9]   Discharge Diagnosis  SOB (shortness of breath) [R06.02] Gastrointestinal hemorrhage, unspecified gastrointestinal hemorrhage type [K92.2] Anemia, unspecified type [D64.9]   Active Problems:   GIB (gastrointestinal bleeding)   Sinus tachycardia      Past Medical History:  Diagnosis Date  . Bladder cancer ([place])   . COPD (chronic obstructive pulmonary disease) ([place])   . Hyperlipidemia   . Hypertension   . Kidney disease   . Type 2 diabetes mellitus ([place])     Past Surgical History:  Procedure Laterality Date  . COLONOSCOPY    . ESOPHAGOGASTRODUODENOSCOPY (EGD) WITH PROPOFOL N/A 04/21/2019   Procedure: ESOPHAGOGASTRODUODENOSCOPY (EGD) WITH PROPOFOL;  Surgeon: Jonathon Bellows, MD;  Location: [facility] ENDOSCOPY;  Service: Gastroenterology;  Laterality: N/A;  . LEFT HEART CATH AND CORONARY ANGIOGRAPHY N/A 10/31/2017   Procedure: LEFT HEART CATH AND CORONARY ANGIOGRAPHY;  Surgeon: Yolonda Kida, MD;  Location: [facility];  Service: Cardiovascular;  Laterality: N/A;  . REVISION UROSTOMY CUTANEOUS         History of present illness and  Hospital Course:     Kindly see H&P for history of present illness and admission details, please review complete Labs, Consult reports and Test reports for all  details in brief  HPI  from the history and physical done on the day of admission  78 year old male with history of bladder cancer status post urostomy comes in with hematemesis, melena, admitted for GI bleed with hemoglobin 5.5 on admission  Hospital Course  #1. acute upper GI bleed, patient is admitted to medical service, hemoglobin 5.5 on admission, episode of hematemesis, melena at home, admitted to medical service, will start her on Protonix infusion, received blood transfusion IV fluids, patient is kept n.p.o., seen by gastroenterology, seen by Dr. 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  Secondary to his severe tachycardia, patient immediately transferred to telemetry and also received a dose of esmolol 20 mg 1 time, seen by cardiology Dr. end who recommended to monitor on telemetry, echocardiogram showed EF more than 50%, EKG showed PACs only.  Patient remained hemodynamically stable, cardiology cleared him to have repeat EGD for clipping of Dileufoy  lesion.  #2 acute blood loss anemia secondary to GI bleed, hemoglobin 5.5 on admission, received total of 3 units of blood transfusion during hospitalization, hemoglobin this morning 10.6.  #3. upper GI bleed, status post EGD x2,Dileufoy lesion is clipped with 3 clips, hemoglobin is stable, patient received 72 hours of Protonix infusion, gradually advance diet, discharge the patient home today.  Patient can see gastroenterology as an outpatient in couple of weeks.  Continue Protonix 40 mg p.o. daily for 6 weeks, #4. hypokalemia, replace potassium 5.  History of bladder cancer With ileal conduit, urostomy, has chronic bilateral hydronephrosis, patient follows with [facility] urology, [facility] urology,  patient is on Keflex to continue and finish the course.  Seen by wound  care nurse for colostomy care, patient has large parastomal hernia patient has ostomy supplies.  Continue full wound care, follow with urology.  #6 diabetes mellitus type 2, continue home dose antidiabetic regimen   #7 severe tachycardia likely due to GI bleed, improved with IV fluids, blood transfusion, seen by cardiology, has history of CABG, takes aspirin, statins continue them.  No chest pain, no EKG changes.  Patient can follow-up with Dr. Saunders Revel.  Previously was following with Dr. Clayborn Bigness but patient wants to change cardiologist, can follow with Dr. Saunders Revel Discharge Condition: Stable   Follow UP  Follow-up Information    End, Harrell Gave, MD On 05/07/2019.   Specialty: Cardiology Why: appointment at [facility] information: [place] [address] [phone]        Glendon Axe, MD. Schedule an appointment as soon as possible for a visit in 1 week(s).   Specialty: Internal Medicine Contact information: [place] [address] [phone]        Jonathon Bellows, MD. Schedule an appointment as soon as possible for a visit in 1 week(s).   Specialty: Gastroenterology Contact information: [place] [address] [phone]             Discharge Instructions  and  Discharge Medications      Allergies as of 04/24/2019   No Known Allergies     Medication List    TAKE these medications   aspirin EC 81 MG tablet Take 81 mg by mouth daily.   atorvastatin 80 MG tablet Commonly known as: LIPITOR Take 80 mg by mouth at bedtime.   blood glucose meter kit and supplies Kit Dispense based on patient and insurance preference. Use up to four times daily as directed. (FOR ICD-9 250.00, 250.01).   cephALEXin 500 MG capsule Commonly known as: KEFLEX Take 500 mg by mouth 3 (three) times daily.   Dulera 200-5 MCG/ACT Aero Generic drug: mometasone-formoterol Inhale 2 puffs  into the lungs 2 (two) times daily.   furosemide 40 MG tablet Commonly known as: LASIX Take 40 mg by mouth daily.   glimepiride 2 MG tablet Commonly known as: AMARYL Take 2 mg by mouth daily with breakfast.   guaiFENesin 600 MG 12 hr tablet Commonly known as: MUCINEX Take 1 tablet (600 mg total) by mouth 2 (two) times daily.   ipratropium-albuterol 0.5-2.5 (3) MG/3ML Soln Commonly known as: DUONEB Take 3 mLs by nebulization every 6 (six) hours as needed (wheezing, shortness of breath).   metoprolol tartrate 25 MG tablet Commonly known as: LOPRESSOR Take 1 tablet (25 mg total) by mouth 2 (two) times daily.   pantoprazole 40 MG tablet Commonly known as: Protonix Take 1 tablet (40 mg total) by mouth daily.   sodium bicarbonate 650 MG tablet Take 1,300 mg by mouth 3 (three) times daily.         Diet and Activity recommendation: See Discharge Instructions above   Consults obtained -gastroenterology, cardiology   Major procedures and Radiology Reports - PLEASE review detailed and final reports for all details, in brief -      Ct Chest Wo Contrast  Result Date: 04/01/2019 CLINICAL DATA:  78 year old male pneumonia, advanced COPD, hypoxemic respiratory failure. Clinical history also includes bladder cancer. EXAM: CT CHEST WITHOUT CONTRAST TECHNIQUE: Multidetector CT imaging of the chest was performed following the standard protocol  without IV contrast. COMPARISON:  Prior CT scan of the chest, abdomen and pelvis 10/22/2007 FINDINGS: Cardiovascular: Limited evaluation in the absence of intravenous contrast. Atherosclerotic calcifications are present throughout the aorta. Patient is status post median sternotomy with evidence of prior multivessel CABG. The heart is at the upper limits of normal for size. No pericardial effusion. Extensive native coronary artery disease. Mediastinum/Nodes: Unremarkable CT appearance of the thyroid gland. No suspicious mediastinal or hilar  adenopathy. No soft tissue mediastinal mass. The thoracic esophagus is unremarkable. Lungs/Pleura: Moderately severe combined centrilobular and paraseptal pulmonary emphysema. Architectural distortion and linear pleuroparenchymal scarring present in the posterior right upper lobe suggesting sequelae of a prior infarct or radiation treatment. Mild chronic atelectasis versus scarring in the lung bases. Diffuse mild bronchial wall thickening. Small left and trace right pleural effusions. Upper Abdomen: Incompletely imaged kidneys demonstrate bilateral hydronephrosis. Otherwise, no acute abnormality within the upper abdomen. Musculoskeletal: No acute fracture or aggressive appearing lytic or blastic osseous lesion. Stable L1 compression fracture with approximately 50% height loss. Healed median sternotomy. IMPRESSION: 1. No acute cardiopulmonary process. 2. Incompletely imaged bilateral hydronephrosis. Recommend clinical correlation with serum creatinine. 3. Moderately severe combined centrilobular and paraseptal pulmonary emphysema. 4. Diffuse mild bronchial wall thickening suggests chronic bronchitis. 5. Areas of scarring and architectural distortion in the posterior right upper lobe and to a lesser extent in the bilateral lower lobes. 6. Small left and trace right pleural effusions. 7. Stable chronic L1 compression fracture. Aortic Atherosclerosis (ICD10-I70.0) and Emphysema (ICD10-J43.9). Electronically Signed   By: Jacqulynn Cadet M.D.   On: 04/01/2019 19:26   Nm Gi Blood Loss  Result Date: 04/21/2019 CLINICAL DATA:  GI bleed. EXAM: NUCLEAR MEDICINE GASTROINTESTINAL BLEEDING SCAN TECHNIQUE: Sequential abdominal images were obtained following intravenous administration of Tc-28mlabeled red blood cells. RADIOPHARMACEUTICALS:  23.7 mCi Tc-976mertechnetate in-vitro labeled red cells. COMPARISON:  CT abdomen 03/25/2012. FINDINGS: No focal areas of GI bleed noted. Increased activity noted over the perineum most  likely penile activity. This did not moved. IMPRESSION: No evidence of GI bleed. Electronically Signed   By: ThMarcello MooresRegister   On: 04/21/2019 09:23   [place]  Result Date: 04/02/2019 CLINICAL DATA:  Renal failure, type II diabetes mellitus, hypertension, history bladder cancer EXAM: RENAL / URINARY TRACT ULTRASOUND COMPLETE COMPARISON:  CT abdomen and pelvis 03/25/2012 FINDINGS: Right Kidney: Renal measurements: 10.6 x 5.4 x 5.3 cm = volume: 157 mL. Cortical thinning and increased cortical echogenicity. Lobulated renal contours. Multiple renal cysts identified, most notably 2.4 x 2.5 x 2.7 cm diameter simple cyst and 2.1 x 2.0 x 2.4 cm diameter simple cyst at the upper pole of the RIGHT kidney. Significant hydronephrosis. No solid renal mass or shadowing calcification. Left Kidney: Renal measurements: 10.8 x 5.0 x 5.8 cm = volume: 164 mL. Cortical thinning and minimally increased cortical echogenicity. Significant hydronephrosis. Small cyst at inferior pole 11 x 11 x 12 mm. No solid mass or shadowing calcification. Bladder: Surgically absent, patient has urostomy IMPRESSION: Significant BILATERAL hydronephrosis, which could be due to obstruction or reflux in a patient with prior bladder resection and urostomy. BILATERAL renal cysts. Electronically Signed   By: MaLavonia Dana.D.   On: 04/02/2019 10:35   Dg Chest Port 1 View  Result Date: 04/20/2019 CLINICAL DATA:  Shortness of breath and hypotension. Blood in stool today. EXAM: PORTABLE CHEST 1 VIEW COMPARISON:  None. FINDINGS: Small left pleural effusion and mild basilar atelectasis are seen. Lungs otherwise clear. Lungs are emphysematous. No pneumothorax.  Heart size is upper normal. The patient is status post CABG. Aortic atherosclerosis noted. IMPRESSION: Small left pleural effusion and mild basilar atelectasis. Emphysema. Atherosclerosis. Electronically Signed   By: Inge Rise M.D.   On: 04/20/2019 14:07   Dg Chest Port 1 View  Result Date:  04/01/2019 CLINICAL DATA:  Pulmonary disease. EXAM: PORTABLE CHEST 1 VIEW COMPARISON:  03/29/2019 and 02/15/2019 and October 29, 2017 FINDINGS: The heart size and pulmonary vascularity are normal. CABG. The accentuation of the interstitial markings has returned to baseline since the prior study. The patient has bilateral chronic interstitial disease with no acute abnormalities. No effusions. No significant bone abnormality. IMPRESSION: No acute cardiopulmonary disease. Chronic interstitial lung disease. Electronically Signed   By: Lorriane Shire M.D.   On: 04/01/2019 09:18   Dg Chest Portable 1 View  Result Date: 03/29/2019 CLINICAL DATA:  Weakness EXAM: PORTABLE CHEST 1 VIEW COMPARISON:  02/15/2019, 10/30/2017, 08/09/2016 FINDINGS: Post sternotomy changes. Cardiomegaly. Coarse bilateral interstitial opacity, suspect for chronic interstitial change. Streaky superimposed opacity in the left mid and lower lung. Aortic atherosclerosis. No pneumothorax. IMPRESSION: 1. Cardiomegaly. 2. Streaky left mid and lower lung opacities, suspect for acute infectious or inflammatory process superimposed on underlying chronic disease. Electronically Signed   By: Donavan Foil M.D.   On: 03/29/2019 21:16    Micro Results     Recent Results (from the past 240 hour(s))  SARS Coronavirus 2 [facility] order, Performed in [facility] hospital lab) Nasopharyngeal Nasopharyngeal Swab     Status: None   Collection Time: 04/20/19  2:32 PM   Specimen: Nasopharyngeal Swab  Result Value Ref Range Status   SARS Coronavirus 2 NEGATIVE NEGATIVE Final    Comment: (NOTE) If result is NEGATIVE SARS-CoV-2 target nucleic acids are NOT DETECTED. The SARS-CoV-2 RNA is generally detectable in upper and lower  respiratory specimens during the acute phase of infection. The lowest  concentration of SARS-CoV-2 viral copies this assay can detect is 250  copies / mL. A negative result does not preclude SARS-CoV-2 infection  and should not be  used as the sole basis for treatment or other  patient management decisions.  A negative result may occur with  improper specimen collection / handling, submission of specimen other  than nasopharyngeal swab, presence of viral mutation(s) within the  areas targeted by this assay, and inadequate number of viral copies  (<250 copies / mL). A negative result must be combined with clinical  observations, patient history, and epidemiological information. If result is POSITIVE SARS-CoV-2 target nucleic acids are DETECTED. The SARS-CoV-2 RNA is generally detectable in upper and lower  respiratory specimens dur ing the acute phase of infection.  Positive  results are indicative of active infection with SARS-CoV-2.  Clinical  correlation with patient history and other diagnostic information is  necessary to determine patient infection status.  Positive results do  not rule out bacterial infection or co-infection with other viruses. If result is PRESUMPTIVE POSTIVE SARS-CoV-2 nucleic acids MAY BE PRESENT.   A presumptive positive result was obtained on the submitted specimen  and confirmed on repeat testing.  While 2019 novel coronavirus  (SARS-CoV-2) nucleic acids may be present in the submitted sample  additional confirmatory testing may be necessary for epidemiological  and / or clinical management purposes  to differentiate between  SARS-CoV-2 and other Sarbecovirus currently known to infect humans.  If clinically indicated additional testing with an alternate test  methodology [id number]) is advised. The SARS-CoV-2 RNA is generally  detectable in  upper and lower respiratory sp ecimens during the acute  phase of infection. The expected result is Negative. Fact Sheet for Patients:  StrictlyIdeas.no Fact Sheet for Healthcare Providers: BankingDealers.co.za This test is not yet approved or cleared by the [place] FDA and has been authorized  for detection and/or diagnosis of SARS-CoV-2 by FDA under an Emergency Use Authorization (EUA).  This EUA will remain in effect (meaning this test can be used) for the duration of the COVID-19 declaration under Section 564(b)(1) of the Act, 21 U.S.C. section 360bbb-3(b)(1), unless the authorization is terminated or revoked sooner. Performed at [facility], [address] [place], [place] [address]        Today   Subjective:   Bryan Brewer today has no headache,no chest abdominal pain,no new weakness tingling or numbness, feels much better wants to go home today.   Objective:   Blood pressure 129/66, pulse 94, temperature 98.3 F (36.8 C), temperature source Oral, resp. rate 18, height 5' 7" (1.702 m), weight 71 kg, SpO2 96 %.   Intake/Output Summary (Last 24 hours) at 04/24/2019 1313 Last data filed at 04/24/2019 1009 Gross per 24 hour  Intake 1056.94 ml  Output 2500 ml  Net -1443.06 ml    Exam Awake Alert, Oriented x 3, No new F.N deficits, Normal affect [place].AT,PERRAL Supple Neck,No JVD, No cervical lymphadenopathy appriciated.  Symmetrical Chest wall movement, Good air movement bilaterally, CTAB RRR,No Gallops,Rubs or new Murmurs, No Parasternal Heave +ve B.Sounds, Abd Soft, Non tender, No organomegaly appriciated, No rebound -guarding or rigidity. No Cyanosis, Clubbing or edema, No new Rash or bruise  Data Review   CBC w Diff:  Lab Results  Component Value Date   WBC 13.3 (H) 04/24/2019   HGB 10.6 (L) 04/24/2019   HGB 14.8 04/21/2012   HCT 34.3 (L) 04/24/2019   HCT 45.4 04/21/2012   PLT 182 04/24/2019   PLT 327 04/21/2012   LYMPHOPCT 1 03/29/2019   LYMPHOPCT 11.8 04/21/2012   MONOPCT 6 03/29/2019   MONOPCT 7.3 04/21/2012   EOSPCT 0 03/29/2019   EOSPCT 1.4 04/21/2012   BASOPCT 0 03/29/2019   BASOPCT 1.3 04/21/2012    CMP:  Lab Results  Component Value Date   NA 148 (H) 04/22/2019   NA 139 04/21/2012   K 3.7 04/22/2019   K 4.8  04/21/2012   CL 118 (H) 04/22/2019   CL 105 04/21/2012   CO2 21 (L) 04/22/2019   CO2 28 04/21/2012   BUN 84 (H) 04/22/2019   BUN 15 04/21/2012   CREATININE 2.35 (H) 04/22/2019   CREATININE 1.27 04/21/2012   PROT 6.0 (L) 03/30/2019   PROT 7.4 04/21/2012   ALBUMIN 2.1 (L) 03/30/2019   ALBUMIN 3.4 04/21/2012   BILITOT 1.0 03/30/2019   BILITOT 0.4 04/21/2012   ALKPHOS 55 03/30/2019   ALKPHOS 79 04/21/2012   AST 26 03/30/2019   AST 14 (L) 04/21/2012   ALT 14 03/30/2019   ALT 16 04/21/2012  .   Total Time in preparing paper work, data evaluation and todays exam - 17 minutes  Epifanio Lesches M.D on 04/24/2019 at 1:13 PM    Note: This dictation was prepared with Dragon dictation along with smaller phrase technology. Any transcriptional errors that result from this process are unintentional.

## 2019-04-24 NOTE — Telephone Encounter (Signed)
Patient remains in hospital at this time.

## 2019-04-24 NOTE — Progress Notes (Signed)
Ambulated patient to the hall way and his HR came up to 120 non sustaining, HR at rest is less than a 100, this is after he received metoprolol p.o. Awaiting for Dr. Governor Specking response. Although I talked to her awhile ago and her goal for this patient's HR is less than a 100. RN will continue to monitor.

## 2019-04-24 NOTE — Plan of Care (Signed)
  Problem: Clinical Measurements: Goal: Ability to maintain clinical measurements within normal limits will improve Outcome: Progressing   Problem: Clinical Measurements: Goal: Cardiovascular complication will be avoided Outcome: Progressing   Problem: Nutrition: Goal: Adequate nutrition will be maintained Outcome: Progressing       Pt tolerating clear liquids well.  Problem: Elimination: Goal: Will not experience complications related to urinary retention Outcome: Progressing

## 2019-04-24 NOTE — Progress Notes (Signed)
Per patient has had 4 loose black BM, diet advaced to full liquid and tolerated well. Dr. Vianne Bulls notified.

## 2019-04-24 NOTE — Progress Notes (Signed)
Notify Dr. Jannifer Franklin about patient's loose stool asked if he can Imodium, order given. RN will continue to monitor.

## 2019-04-24 NOTE — Progress Notes (Signed)
Bryan Brewer , MD [address] [place], [place], [place], [id number] Bryan Brewer, Bryan Brewer, Bryan Brew[place], [place], [address] Phone: [phone]  Fax: [phone]   Bryan Brewer is being followed for GI bleed   Subjective: Feels well, no new issues, no melena    Objective: Vital signs in last 24 hours: Vitals:   04/23/19 1657 04/23/19 2040 04/24/19 0352 04/24/19 0842  BP: 121/69 134/73 129/75 129/66  Pulse: 90 89 (!) 110 94  Resp: 18 18 14 18   Temp: 98.3 F (36.8 C) 97.8 F (36.6 C) 98 F (36.7 C) 98.3 F (36.8 C)  TempSrc: Oral Oral Oral Oral  SpO2: 98% 97% 98% 96%  Weight:   71 kg   Height:       Weight change:   Intake/Output Summary (Last 24 hours) at 04/24/2019 1045 Last data filed at 04/24/2019 1009 Gross per 24 hour  Intake 1575.11 ml  Output 2500 ml  Net -924.89 ml     Exam: Heart:: Regular rate and rhythm, S1S2 present or without murmur or extra heart sounds Lungs: normal, clear to auscultation and clear to auscultation and percussion Abdomen: soft, nontender, normal bowel sounds   Lab Results: @LABTEST2 @ Micro Results: Recent Results (from the past 240 hour(s))  SARS Coronavirus 2 [facility] order, Performed in [place] hospital lab) Nasopharyngeal Nasopharyngeal Swab     Status: None   Collection Time: 04/20/19  2:32 PM   Specimen: Nasopharyngeal Swab  Result Value Ref Range Status   SARS Coronavirus 2 NEGATIVE NEGATIVE Final    Comment: (NOTE) If result is NEGATIVE SARS-CoV-2 target nucleic acids are NOT DETECTED. The SARS-CoV-2 RNA is generally detectable in upper and lower  respiratory specimens during the acute phase of infection. The lowest  concentration of SARS-CoV-2 viral copies this assay can detect is 250  copies / mL. A negative result does not preclude SARS-CoV-2 infection  and should not be used as the sole basis for treatment or other  patient management decisions.  A negative result may occur with  improper specimen  collection / handling, submission of specimen other  than nasopharyngeal swab, presence of viral mutation(s) within the  areas targeted by this assay, and inadequate number of viral copies  (<250 copies / mL). A negative result must be combined with clinical  observations, patient history, and epidemiological information. If result is POSITIVE SARS-CoV-2 target nucleic acids are DETECTED. The SARS-CoV-2 RNA is generally detectable in upper and lower  respiratory specimens dur ing the acute phase of infection.  Positive  results are indicative of active infection with SARS-CoV-2.  Clinical  correlation with patient history and other diagnostic information is  necessary to determine patient infection status.  Positive results do  not rule out bacterial infection or co-infection with other viruses. If result is PRESUMPTIVE POSTIVE SARS-CoV-2 nucleic acids MAY BE PRESENT.   A presumptive positive result was obtained on the submitted specimen  and confirmed on repeat testing.  While 2019 novel coronavirus  (SARS-CoV-2) nucleic acids may be present in the submitted sample  additional confirmatory testing may be necessary for epidemiological  and / or clinical management purposes  to differentiate between  SARS-CoV-2 and other Sarbecovirus currently known to infect humans.  If clinically indicated additional testing with an alternate test  methodology [phone]) is advised. The SARS-CoV-2 RNA is generally  detectable in upper and lower respiratory sp ecimens during the acute  phase of infection. The expected result is Negative. Fact Sheet for Patients:  StrictlyIdeas.no Fact Sheet for Healthcare Providers: BankingDealers.co.za This test is not yet approved or cleared by the [place] FDA and has been authorized for detection and/or diagnosis of SARS-CoV-2 by FDA under an Emergency Use Authorization (EUA).  This EUA will remain in effect  (meaning this test can be used) for the duration of the COVID-19 declaration under Section 564(b)(1) of the Act, 21 U.S.C. section 360bbb-3(b)(1), unless the authorization is terminated or revoked sooner. Performed at [facility], [address] [place], [place] [address]    Studies/Results: No results found. Medications: I have reviewed the patient's current medications. Scheduled Meds: . sodium chloride   Intravenous Once  . atorvastatin  80 mg Oral QHS  . cephALEXin  250 mg Oral TID  . insulin aspart  0-9 Units Subcutaneous TID WC  . mometasone-formoterol  2 puff Inhalation BID  . pantoprazole (PROTONIX) IV  40 mg Intravenous Q12H  . sodium bicarbonate  1,300 mg Oral TID   Continuous Infusions: PRN Meds:.acetaminophen **OR** acetaminophen, HYDROcodone-acetaminophen, ondansetron **OR** ondansetron (ZOFRAN) IV, polyethylene glycol   Assessment: Active Problems:   GIB (gastrointestinal bleeding)   Sinus tachycardia  Bryan Brewer is a 78 y.o. y/o male with with a history of bladder cancer admitted with hematemesis on 1 occasion and melena.  Drop in hemoglobin to 5.5 g.  Status post transfusion.  EGD performed demonstrated a July 5 lesion that has been clipped with 3 clips.  Hemoglobin has been stable  Plan 1.  Complete 72 hours of IV PPI 2.  Gradually advance diet. 3.  No further input from the GI point of view.  I will sign off.  Please call me if any further GI concerns or questions.  We would like to thank you for the opportunity to participate in the care of Bryan Brewer.      LOS: 4 days   Bryan Bellows, MD 04/24/2019, 10:45 AM

## 2019-04-24 NOTE — Evaluation (Signed)
Physical Therapy Evaluation Patient Details Name: Bryan Brewer MRN: [id number] DOB: 12-01-1940 Today's Date: 04/24/2019   History of Present Illness  Bryan Brewer is a 89yoM who comes to [facility] after melena and hematemesis, noted GIB, Hb: 5.5 upon arrival, now s/p 3u PRBC. PMH: bladder CA s/p urostomy, recent UTI sepsis.  Clinical Impression  Pt admitted with above diagnosis. Pt currently with functional limitations due to the deficits listed below (see "PT Problem List"). Upon entry, pt in bed, awake and agreeable to participate, wife at bedside. The pt is alert and oriented x4, pleasant, conversational, and generally a good historian. Pt has had diarrhea this date prior to session, brief donned for OOB mobility at request of patient and wife. ModI mobility to EOB, seated HR 120 bpm no CP/SOB. Pt AMB to doorway and back to bed with noted increased RR and SOB, HR with irregular rhythm and rates 133-140bpm, at this point, no additional AMB. Pt assisted to recliner via SPT after adequate recovery time (DOE resolved after 60sec.) Functional mobility assessment demonstrates increased effort/time requirements, poor tolerance, and need for physical assistance, whereas the patient performed these at a higher level of independence PTA. Pt denies dizziness or lightheadedness. RN and cardiology made aware of symptoms response. Pt will benefit from skilled PT intervention to increase independence and safety with basic mobility in preparation for discharge to the venue listed below.       Follow Up Recommendations Home health PT    Equipment Recommendations  Rolling walker with 5" wheels    Recommendations for Other Services       Precautions / Restrictions Precautions Precautions: Fall Precaution Comments: LLQ urostomy; tacycardia at rest worse with AMB Restrictions Weight Bearing Restrictions: No      Mobility  Bed Mobility Overal bed mobility: Modified Independent             General bed  mobility comments: able to bridge to pull up diaper; able to roll on/off bedpan.  Transfers Overall transfer level: Needs assistance Equipment used: None;Rolling walker (2 wheeled) Transfers: Sit to/from [facility] Sit to Stand: Min guard Stand pivot transfers: Min guard       General transfer comment: maximal effort from EOB with BUE support, needs immediate HHA for balance once upright.  Ambulation/Gait   Gait Distance (Feet): 38 Feet(AMB ended d/t tachycardia and SOB; 227ft last admission 1MA) Assistive device: Rolling walker (2 wheeled)       General Gait Details: appears fairly stable with RW  Stairs            Wheelchair Mobility    Modified Rankin (Stroke Patients Only)       Balance Overall balance assessment: Needs assistance Sitting-balance support: No upper extremity supported;Feet supported Sitting balance-Leahy Scale: Normal     Standing balance support: Single extremity supported;During functional activity;Bilateral upper extremity supported Standing balance-Leahy Scale: Fair                               Pertinent Vitals/Pain Pain Assessment: No/denies pain    Home Living Family/patient expects to be discharged to:: Private residence Living Arrangements: Spouse/significant other Available Help at Discharge: Family;Available 24 hours/day Type of Home: House Home Access: Stairs to enter Entrance Stairs-Rails: None Entrance Stairs-Number of Steps: 2 Home Layout: Able to live on main level with bedroom/bathroom Home Equipment: Cane - single point;Walker - 2 wheels;Walker - 4 wheels      Prior Function  Level of Independence: Independent         Comments: Indep without assist device for ADLs, household and community mobilization at baseline; recent use of SPC due to weakness in recent weeks. Endorses single fall within previous six months.     Hand Dominance        Extremity/Trunk Assessment   Upper  Extremity Assessment Upper Extremity Assessment: Generalized weakness    Lower Extremity Assessment Lower Extremity Assessment: Generalized weakness       Communication   Communication: HOH  Cognition Arousal/Alertness: Awake/alert Behavior During Therapy: WFL for tasks assessed/performed Overall Cognitive Status: Within Functional Limits for tasks assessed                                        General Comments      Exercises     Assessment/Plan    PT Assessment Patient needs continued PT services  PT Problem List Decreased strength;Decreased activity tolerance;Decreased balance;Decreased mobility;Cardiopulmonary status limiting activity;Decreased safety awareness       PT Treatment Interventions DME instruction;Balance training;Stair training;Therapeutic activities;Gait training;Functional mobility training;Therapeutic exercise;Patient/family education    PT Goals (Current goals can be found in the Care Plan section)  Acute Rehab PT Goals Patient Stated Goal: Ready to leave PT Goal Formulation: With patient/family Time For Goal Achievement: 04/29/19 Potential to Achieve Goals: Good    Frequency Min 2X/week   Barriers to discharge Inaccessible home environment      Co-evaluation               AM-PAC PT "6 Clicks" Mobility  Outcome Measure Help needed turning from your back to your side while in a flat bed without using bedrails?: None Help needed moving from lying on your back to sitting on the side of a flat bed without using bedrails?: None Help needed moving to and from a bed to a chair (including a wheelchair)?: A Little Help needed standing up from a chair using your arms (e.g., wheelchair or bedside chair)?: A Little Help needed to walk in hospital room?: A Little Help needed climbing 3-5 steps with a railing? : A Little 6 Click Score: 20    End of Session Equipment Utilized During Treatment: Gait belt Activity Tolerance: Patient  tolerated treatment well Patient left: in bed;with bed alarm set;with nursing/sitter in room;with family/visitor present Nurse Communication: Mobility status PT Visit Diagnosis: Muscle weakness (generalized) (M62.81);Difficulty in walking, not elsewhere classified (R26.2)    Time: 0737-1062 PT Time Calculation (min) (ACUTE ONLY): 19 min   Charges:   PT Evaluation $PT Eval Moderate Complexity: 1 Mod         2:58 PM, 04/24/19 Etta Grandchild, PT, DPT Physical Therapist - [facility]  [phone] ([place])   Sumner C 04/24/2019, 2:55 PM

## 2019-04-24 NOTE — Progress Notes (Signed)
Dr. Vianne Bulls called me and say if patient's HR is less than 100 then discharge patient. Hemoglobin came back 9.9 was 10.6 this morning, Dr. Vianne Bulls and Dr. Bailey Mech updated. I will give patient's dose of metoprolol and will monitor HR. RN will continue to monitor.

## 2019-04-24 NOTE — Consult Note (Signed)
[place] Nurse ostomy follow up Stoma type/location: LLQ ileal conduit.  Pouch applied on Wednesday (48 hours ago) is intact with minimal wasting of pectin noted. Patient is trying a skin barrier ring this week.  Has used them in the past and will try them again to extend his wear time. Stomal assessment/size: 1 in ch round, visualized through intact pouching system Peristomal assessment: Large parastomal hernia. Treatment options for stomal/peristomal skin: skin barrier ring over open areas, 2-piece system with floating flange to accommodate hernia Output: light yellow urine with sediment-normal for ileal conduit  Ostomy pouching: 2pc. 2 and 1/4 inch pouching system with skin barrier ring Education provided: Patient is followed for his urinary diversion by the Urology department at [facility] in [place]. He reports that they are watching his hernia and hoping to avoid surgery in this area that might warrant the moving of his conduit. He is established with [facility] for his ostomy supplies and will ask to have skin barrier rings added to his next order. He has a few to take with him today (he is anticipating discharge), a few more at home. He has no other questions. Enrolled patient in [place] Start Discharge program: No  WOC nursing team will not follow, but will remain available to this patient, the nursing and medical teams.  Please re-consult if needed. Thanks, Maudie Flakes, MSN, RN, [place], Arther Abbott  Pager# [phone]

## 2019-04-25 LAB — CBC WITH DIFFERENTIAL/PLATELET
Abs Immature Granulocytes: 0.22 10*3/uL — ABNORMAL HIGH (ref 0.00–0.07)
Basophils Absolute: 0.1 10*3/uL (ref 0.0–0.1)
Basophils Relative: 0 %
Eosinophils Absolute: 0.2 10*3/uL (ref 0.0–0.5)
Eosinophils Relative: 1 %
HCT: 36.2 % — ABNORMAL LOW (ref 39.0–52.0)
Hemoglobin: 11.1 g/dL — ABNORMAL LOW (ref 13.0–17.0)
Immature Granulocytes: 2 %
Lymphocytes Relative: 10 %
Lymphs Abs: 1.4 10*3/uL (ref 0.7–4.0)
MCH: 28.7 pg (ref 26.0–34.0)
MCHC: 30.7 g/dL (ref 30.0–36.0)
MCV: 93.5 fL (ref 80.0–100.0)
Monocytes Absolute: 0.8 10*3/uL (ref 0.1–1.0)
Monocytes Relative: 6 %
Neutro Abs: 12 10*3/uL — ABNORMAL HIGH (ref 1.7–7.7)
Neutrophils Relative %: 81 %
Platelets: 190 10*3/uL (ref 150–400)
RBC: 3.87 MIL/uL — ABNORMAL LOW (ref 4.22–5.81)
RDW: 18.4 % — ABNORMAL HIGH (ref 11.5–15.5)
WBC: 14.7 10*3/uL — ABNORMAL HIGH (ref 4.0–10.5)
nRBC: 0 % (ref 0.0–0.2)

## 2019-04-25 LAB — GLUCOSE, CAPILLARY
Glucose-Capillary: 91 mg/dL (ref 70–99)
Glucose-Capillary: 177 mg/dL — ABNORMAL HIGH (ref 70–99)

## 2019-04-25 MED ORDER — PANTOPRAZOLE SODIUM 40 MG PO TBEC: 40.0000 mg | DELAYED_RELEASE_TABLET | Freq: Every day | ORAL | 11 refills | Status: DC

## 2019-04-25 MED ORDER — METOPROLOL TARTRATE 50 MG PO TABS: 50.0000 mg | ORAL_TABLET | Freq: Two times a day (BID) | ORAL | 0 refills | Status: DC

## 2019-04-25 MED ORDER — PROCHLORPERAZINE EDISYLATE 10 MG/2ML IJ SOLN
10.0000 mg | Freq: Four times a day (QID) | INTRAMUSCULAR | Status: DC | PRN
  Filled 2019-04-25: qty 2

## 2019-04-25 MED ORDER — METOPROLOL TARTRATE 50 MG PO TABS
50.0000 mg | ORAL_TABLET | Freq: Two times a day (BID) | ORAL | Status: DC
  Administered 2019-04-25: 50 mg via ORAL
  Filled 2019-04-25: qty 1

## 2019-04-25 NOTE — Progress Notes (Signed)
Went over discharge instructions with the patient and wife including follow-up appointment and medications. Discontinue PIV and telemetry monitor. Wheeled patient downstairs for his ride.

## 2019-04-25 NOTE — Discharge Summary (Signed)
Bryan Brewer, is a 78 y.o. male  DOB 1940-11-30  MRN [id number].  Admission date:  04/20/2019  Admitting Physician  Bettey Costa, MD  Discharge Date:  04/25/2019   Primary MD  Glendon Axe, MD  Recommendations for primary care physician for things to follow:   With PCP in 1 week Follow-up with gastroenterology Dr. Jonathon Bellows in 2 to 3 weeks   Admission Diagnosis  SOB (shortness of breath) [R06.02] Gastrointestinal hemorrhage, unspecified gastrointestinal hemorrhage type [K92.2] Anemia, unspecified type [D64.9]   Discharge Diagnosis  SOB (shortness of breath) [R06.02] Gastrointestinal hemorrhage, unspecified gastrointestinal hemorrhage type [K92.2] Anemia, unspecified type [D64.9]   Active Problems:   GIB (gastrointestinal bleeding)   Sinus tachycardia      Past Medical History:  Diagnosis Date  . Bladder cancer ([place])   . COPD (chronic obstructive pulmonary disease) ([place])   . Hyperlipidemia   . Hypertension   . Kidney disease   . Type 2 diabetes mellitus ([place])     Past Surgical History:  Procedure Laterality Date  . COLONOSCOPY    . ESOPHAGOGASTRODUODENOSCOPY N/A 04/23/2019   Procedure: ESOPHAGOGASTRODUODENOSCOPY (EGD);  Surgeon: Jonathon Bellows, MD;  Location: [facility] ENDOSCOPY;  Service: Gastroenterology;  Laterality: N/A;  . ESOPHAGOGASTRODUODENOSCOPY (EGD) WITH PROPOFOL N/A 04/21/2019   Procedure: ESOPHAGOGASTRODUODENOSCOPY (EGD) WITH PROPOFOL;  Surgeon: Jonathon Bellows, MD;  Location: [facility] ENDOSCOPY;  Service: Gastroenterology;  Laterality: N/A;  . LEFT HEART CATH AND CORONARY ANGIOGRAPHY N/A 10/31/2017   Procedure: LEFT HEART CATH AND CORONARY ANGIOGRAPHY;  Surgeon: Yolonda Kida, MD;  Location: [place] CV LAB;  Service: Cardiovascular;  Laterality: N/A;  . REVISION UROSTOMY CUTANEOUS          History of present illness and  Hospital Course:     Kindly see H&P for history of present illness and admission details, please review complete Labs, Consult reports and Test reports for all details in brief  HPI  from the history and physical done on the day of admission  78 year old male with history of bladder cancer status post urostomy comes in with hematemesis, melena, admitted for GI bleed with hemoglobin 5.5 on admission  Hospital Course  #1. acute upper GI bleed, patient is admitted to medical service, hemoglobin 5.5 on admission, episode of hematemesis, melena at home, admitted to medical service, will start her on Protonix infusion, received blood transfusion IV fluids, patient is kept n.p.o., seen by gastroenterology, seen by Dr. 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  Secondary to his severe tachycardia, patient immediately transferred to telemetry and also received a dose of esmolol 20 mg 1 time, seen by cardiology Dr. end who recommended to monitor on telemetry, echocardiogram showed EF more than 50%, EKG showed PACs only.  Patient remained hemodynamically stable, cardiology cleared him to have repeat EGD for clipping of Dileufoy  lesion.  #2 acute blood loss anemia secondary to GI bleed, hemoglobin 5.5 on admission, received total of 3 units of blood transfusion during hospitalization, hemoglobin this morning 10.6.  #3. upper GI bleed, status post EGD x2,Dileufoy lesion is clipped with 3 clips, hemoglobin is stable, patient received 72 hours of Protonix infusion, gradually advance diet, discharge the patient home today.  Patient can see gastroenterology as an outpatient in couple of weeks.  Continue Protonix 40 mg p.o. daily for 6 weeks, #  4. hypokalemia, replace potassium 5.  History of  bladder cancer With ileal conduit, urostomy, has chronic bilateral hydronephrosis, patient follows with [facility] urology, [facility] urology, patient is on Keflex to continue and finish the course.  Seen by wound care nurse for colostomy care, patient has large parastomal hernia patient has ostomy supplies.  Continue full wound care, follow with urology.  #6 diabetes mellitus type 2, continue home dose antidiabetic regimen   #7 severe tachycardia likely due to GI bleed, improved with IV fluids, blood transfusion, seen by cardiology, has history of CABG, takes aspirin, statins continue them.  No chest pain, no EKG changes.  Patient can follow-up with Dr. Saunders Revel.  Previously was following with Dr. Clayborn Bigness but patient wants to change cardiologist, can follow with Dr. Saunders Revel Discharge Condition: Stable   Follow UP  Follow-up Information    End, Harrell Gave, MD On 05/07/2019.   Specialty: Cardiology Why: appointment at [facility] information: [place] [address] [phone]        Glendon Axe, MD. Schedule an appointment as soon as possible for a visit in 1 week(s).   Specialty: Internal Medicine Contact information: [place] [address] [phone]        Jonathon Bellows, MD. Schedule an appointment as soon as possible for a visit in 1 week(s).   Specialty: Gastroenterology Contact information: [place] [address] [phone]             Discharge Instructions  and  Discharge Medications     Discharge Instructions    Diet - low sodium heart healthy   Complete by: As directed    Increase activity slowly   Complete by: As directed      Allergies as of 04/25/2019   No Known Allergies     Medication List    STOP taking these medications   cephALEXin 500 MG capsule Commonly known as: KEFLEX     TAKE these medications   aspirin EC 81 MG tablet Take 81 mg by mouth  daily.   atorvastatin 80 MG tablet Commonly known as: LIPITOR Take 80 mg by mouth at bedtime.   blood glucose meter kit and supplies Kit Dispense based on patient and insurance preference. Use up to four times daily as directed. (FOR ICD-9 250.00, 250.01).   Dulera 200-5 MCG/ACT Aero Generic drug: mometasone-formoterol Inhale 2 puffs into the lungs 2 (two) times daily.   furosemide 40 MG tablet Commonly known as: LASIX Take 40 mg by mouth daily.   glimepiride 2 MG tablet Commonly known as: AMARYL Take 2 mg by mouth daily with breakfast.   guaiFENesin 600 MG 12 hr tablet Commonly known as: MUCINEX Take 1 tablet (600 mg total) by mouth 2 (two) times daily.   ipratropium-albuterol 0.5-2.5 (3) MG/3ML Soln Commonly known as: DUONEB Take 3 mLs by nebulization every 6 (six) hours as needed (wheezing, shortness of breath).   metoprolol tartrate 50 MG tablet Commonly known as: LOPRESSOR Take 1 tablet (50 mg total) by mouth 2 (two) times daily. What changed:   medication strength  how much to take   pantoprazole 40 MG tablet Commonly known as: Protonix Take 1 tablet (40 mg total) by mouth daily.   sodium bicarbonate 650 MG tablet Take 1,300 mg by mouth 3 (three) times daily.         Diet and Activity recommendation: See Discharge Instructions above   Consults obtained -gastroenterology, cardiology  Major procedures and Radiology Reports - PLEASE review detailed and final reports for all details, in brief -      Ct Chest Wo Contrast  Result Date: 04/01/2019 CLINICAL DATA:  78 year old male pneumonia, advanced COPD, hypoxemic respiratory failure. Clinical history also includes bladder cancer. EXAM: CT CHEST WITHOUT CONTRAST TECHNIQUE: Multidetector CT imaging of the chest was performed following the standard protocol without IV contrast. COMPARISON:  Prior CT scan of the chest, abdomen and pelvis 10/22/2007 FINDINGS: Cardiovascular: Limited evaluation in the  absence of intravenous contrast. Atherosclerotic calcifications are present throughout the aorta. Patient is status post median sternotomy with evidence of prior multivessel CABG. The heart is at the upper limits of normal for size. No pericardial effusion. Extensive native coronary artery disease. Mediastinum/Nodes: Unremarkable CT appearance of the thyroid gland. No suspicious mediastinal or hilar adenopathy. No soft tissue mediastinal mass. The thoracic esophagus is unremarkable. Lungs/Pleura: Moderately severe combined centrilobular and paraseptal pulmonary emphysema. Architectural distortion and linear pleuroparenchymal scarring present in the posterior right upper lobe suggesting sequelae of a prior infarct or radiation treatment. Mild chronic atelectasis versus scarring in the lung bases. Diffuse mild bronchial wall thickening. Small left and trace right pleural effusions. Upper Abdomen: Incompletely imaged kidneys demonstrate bilateral hydronephrosis. Otherwise, no acute abnormality within the upper abdomen. Musculoskeletal: No acute fracture or aggressive appearing lytic or blastic osseous lesion. Stable L1 compression fracture with approximately 50% height loss. Healed median sternotomy. IMPRESSION: 1. No acute cardiopulmonary process. 2. Incompletely imaged bilateral hydronephrosis. Recommend clinical correlation with serum creatinine. 3. Moderately severe combined centrilobular and paraseptal pulmonary emphysema. 4. Diffuse mild bronchial wall thickening suggests chronic bronchitis. 5. Areas of scarring and architectural distortion in the posterior right upper lobe and to a lesser extent in the bilateral lower lobes. 6. Small left and trace right pleural effusions. 7. Stable chronic L1 compression fracture. Aortic Atherosclerosis (ICD10-I70.0) and Emphysema (ICD10-J43.9). Electronically Signed   By: Jacqulynn Cadet M.D.   On: 04/01/2019 19:26   Nm Gi Blood Loss  Result Date: 04/21/2019 CLINICAL  DATA:  GI bleed. EXAM: NUCLEAR MEDICINE GASTROINTESTINAL BLEEDING SCAN TECHNIQUE: Sequential abdominal images were obtained following intravenous administration of Tc-42mlabeled red blood cells. RADIOPHARMACEUTICALS:  23.7 mCi Tc-936mertechnetate in-vitro labeled red cells. COMPARISON:  CT abdomen 03/25/2012. FINDINGS: No focal areas of GI bleed noted. Increased activity noted over the perineum most likely penile activity. This did not moved. IMPRESSION: No evidence of GI bleed. Electronically Signed   By: ThMarcello MooresRegister   On: 04/21/2019 09:23   [place]  Result Date: 04/02/2019 CLINICAL DATA:  Renal failure, type II diabetes mellitus, hypertension, history bladder cancer EXAM: RENAL / URINARY TRACT ULTRASOUND COMPLETE COMPARISON:  CT abdomen and pelvis 03/25/2012 FINDINGS: Right Kidney: Renal measurements: 10.6 x 5.4 x 5.3 cm = volume: 157 mL. Cortical thinning and increased cortical echogenicity. Lobulated renal contours. Multiple renal cysts identified, most notably 2.4 x 2.5 x 2.7 cm diameter simple cyst and 2.1 x 2.0 x 2.4 cm diameter simple cyst at the upper pole of the RIGHT kidney. Significant hydronephrosis. No solid renal mass or shadowing calcification. Left Kidney: Renal measurements: 10.8 x 5.0 x 5.8 cm = volume: 164 mL. Cortical thinning and minimally increased cortical echogenicity. Significant hydronephrosis. Small cyst at inferior pole 11 x 11 x 12 mm. No solid mass or shadowing calcification. Bladder: Surgically absent, patient has urostomy IMPRESSION: Significant BILATERAL hydronephrosis, which could be due to obstruction or reflux in a patient with prior bladder resection and urostomy. BILATERAL  renal cysts. Electronically Signed   By: Lavonia Dana M.D.   On: 04/02/2019 10:35   Dg Chest Port 1 View  Result Date: 04/20/2019 CLINICAL DATA:  Shortness of breath and hypotension. Blood in stool today. EXAM: PORTABLE CHEST 1 VIEW COMPARISON:  None. FINDINGS: Small left pleural effusion and  mild basilar atelectasis are seen. Lungs otherwise clear. Lungs are emphysematous. No pneumothorax. Heart size is upper normal. The patient is status post CABG. Aortic atherosclerosis noted. IMPRESSION: Small left pleural effusion and mild basilar atelectasis. Emphysema. Atherosclerosis. Electronically Signed   By: Inge Rise M.D.   On: 04/20/2019 14:07   Dg Chest Port 1 View  Result Date: 04/01/2019 CLINICAL DATA:  Pulmonary disease. EXAM: PORTABLE CHEST 1 VIEW COMPARISON:  03/29/2019 and 02/15/2019 and October 29, 2017 FINDINGS: The heart size and pulmonary vascularity are normal. CABG. The accentuation of the interstitial markings has returned to baseline since the prior study. The patient has bilateral chronic interstitial disease with no acute abnormalities. No effusions. No significant bone abnormality. IMPRESSION: No acute cardiopulmonary disease. Chronic interstitial lung disease. Electronically Signed   By: Lorriane Shire M.D.   On: 04/01/2019 09:18   Dg Chest Portable 1 View  Result Date: 03/29/2019 CLINICAL DATA:  Weakness EXAM: PORTABLE CHEST 1 VIEW COMPARISON:  02/15/2019, 10/30/2017, 08/09/2016 FINDINGS: Post sternotomy changes. Cardiomegaly. Coarse bilateral interstitial opacity, suspect for chronic interstitial change. Streaky superimposed opacity in the left mid and lower lung. Aortic atherosclerosis. No pneumothorax. IMPRESSION: 1. Cardiomegaly. 2. Streaky left mid and lower lung opacities, suspect for acute infectious or inflammatory process superimposed on underlying chronic disease. Electronically Signed   By: Donavan Foil M.D.   On: 03/29/2019 21:16    Micro Results     Recent Results (from the past 240 hour(s))  SARS Coronavirus 2 [facility] order, Performed in [facility] hospital lab) Nasopharyngeal Nasopharyngeal Swab     Status: None   Collection Time: 04/20/19  2:32 PM   Specimen: Nasopharyngeal Swab  Result Value Ref Range Status   SARS Coronavirus 2 NEGATIVE  NEGATIVE Final    Comment: (NOTE) If result is NEGATIVE SARS-CoV-2 target nucleic acids are NOT DETECTED. The SARS-CoV-2 RNA is generally detectable in upper and lower  respiratory specimens during the acute phase of infection. The lowest  concentration of SARS-CoV-2 viral copies this assay can detect is 250  copies / mL. A negative result does not preclude SARS-CoV-2 infection  and should not be used as the sole basis for treatment or other  patient management decisions.  A negative result may occur with  improper specimen collection / handling, submission of specimen other  than nasopharyngeal swab, presence of viral mutation(s) within the  areas targeted by this assay, and inadequate number of viral copies  (<250 copies / mL). A negative result must be combined with clinical  observations, patient history, and epidemiological information. If result is POSITIVE SARS-CoV-2 target nucleic acids are DETECTED. The SARS-CoV-2 RNA is generally detectable in upper and lower  respiratory specimens dur ing the acute phase of infection.  Positive  results are indicative of active infection with SARS-CoV-2.  Clinical  correlation with patient history and other diagnostic information is  necessary to determine patient infection status.  Positive results do  not rule out bacterial infection or co-infection with other viruses. If result is PRESUMPTIVE POSTIVE SARS-CoV-2 nucleic acids MAY BE PRESENT.   A presumptive positive result was obtained on the submitted specimen  and confirmed on repeat testing.  While 2019  novel coronavirus  (SARS-CoV-2) nucleic acids may be present in the submitted sample  additional confirmatory testing may be necessary for epidemiological  and / or clinical management purposes  to differentiate between  SARS-CoV-2 and other Sarbecovirus currently known to infect humans.  If clinically indicated additional testing with an alternate test  methodology [phone]) is  advised. The SARS-CoV-2 RNA is generally  detectable in upper and lower respiratory sp ecimens during the acute  phase of infection. The expected result is Negative. Fact Sheet for Patients:  StrictlyIdeas.no Fact Sheet for Healthcare Providers: BankingDealers.co.za This test is not yet approved or cleared by the [place] FDA and has been authorized for detection and/or diagnosis of SARS-CoV-2 by FDA under an Emergency Use Authorization (EUA).  This EUA will remain in effect (meaning this test can be used) for the duration of the COVID-19 declaration under Section 564(b)(1) of the Act, 21 U.S.C. section 360bbb-3(b)(1), unless the authorization is terminated or revoked sooner. Performed at [facility], [address] [place], [place] [address]        Today   Subjective:   Bryan Brewer today has no headache,no chest abdominal pain,no new weakness tingling or numbness, feels much better wants to go home today.   Objective:   Blood pressure (!) 116/58, pulse (!) 125, temperature 97.8 F (36.6 C), temperature source Oral, resp. rate 16, height _0  (1.702 m), weight 71 kg, SpO2 100 %.   Intake/Output Summary (Last 24 hours) at 04/25/2019 1341 Last data filed at 04/25/2019 1338 Gross per 24 hour  Intake -  Output 950 ml  Net -950 ml    Exam Awake Alert, Oriented x 3, No new F.N deficits, Normal affect [place].AT,PERRAL Supple Neck,No JVD, No cervical lymphadenopathy appriciated.  Symmetrical Chest wall movement, Good air movement bilaterally, CTAB RRR,No Gallops,Rubs or new Murmurs, No Parasternal Heave +ve B.Sounds, Abd Soft, Non tender, No organomegaly appriciated, No rebound -guarding or rigidity. No Cyanosis, Clubbing or edema, No new Rash or bruise  Data Review   CBC w Diff:  Lab Results  Component Value Date   WBC 14.7 (H) 04/25/2019   HGB 11.1 (L) 04/25/2019   HGB 14.8 04/21/2012   HCT 36.2 (L)  04/25/2019   HCT 45.4 04/21/2012   PLT 190 04/25/2019   PLT 327 04/21/2012   LYMPHOPCT 10 04/25/2019   LYMPHOPCT 11.8 04/21/2012   MONOPCT 6 04/25/2019   MONOPCT 7.3 04/21/2012   EOSPCT 1 04/25/2019   EOSPCT 1.4 04/21/2012   BASOPCT 0 04/25/2019   BASOPCT 1.3 04/21/2012    CMP:  Lab Results  Component Value Date   NA 148 (H) 04/22/2019   NA 139 04/21/2012   K 3.7 04/22/2019   K 4.8 04/21/2012   CL 118 (H) 04/22/2019   CL 105 04/21/2012   CO2 21 (L) 04/22/2019   CO2 28 04/21/2012   BUN 84 (H) 04/22/2019   BUN 15 04/21/2012   CREATININE 2.35 (H) 04/22/2019   CREATININE 1.27 04/21/2012   PROT 6.0 (L) 03/30/2019   PROT 7.4 04/21/2012   ALBUMIN 2.1 (L) 03/30/2019   ALBUMIN 3.4 04/21/2012   BILITOT 1.0 03/30/2019   BILITOT 0.4 04/21/2012   ALKPHOS 55 03/30/2019   ALKPHOS 79 04/21/2012   AST 26 03/30/2019   AST 14 (L) 04/21/2012   ALT 14 03/30/2019   ALT 16 04/21/2012  .   Total Time in preparing paper work, data evaluation and todays exam - 52 minutes  Dustin Flock M.D on 04/25/2019  at 1:41 PM    Note: This dictation was prepared with Dragon dictation along with smaller phrase technology. Any transcriptional errors that result from this process are unintentional.

## 2019-04-25 NOTE — TOC Transition Note (Signed)
Transition of Care [facility]) - CM/SW Discharge Note   Patient Details  Name: Bryan Brewer MRN: [id number] Date of Birth: [birth year]  Transition of Care [facility] CM/SW Contact:  Geralynn Ochs, LCSW Phone Number: 04/25/2019, 2:32 PM   Clinical Narrative:   CSW alerted by RN of patient discharge home. CSW spoke with patient about home health, and patient refusing; says he really doesn't want it, he likes his privacy. Patient says his wife is at home with him and he feels like he'll be just fine without any additional help. Patient already has a walker and a cane at home that he uses. No further needs identified at this time.    Final next level of care: Home/Self Care Barriers to Discharge: Barriers Resolved   Patient Goals and CMS Choice Patient states their goals for this hospitalization and ongoing recovery are:: return home   Choice offered to / list presented to : Patient  Discharge Placement                       Discharge Plan and Services                          HH Arranged: Refused HH          Social Determinants of Health (SDOH) Interventions     Readmission Risk Interventions No flowsheet data found.

## 2019-04-28 ENCOUNTER — Encounter: Payer: Self-pay | Admitting: Emergency Medicine

## 2019-04-28 ENCOUNTER — Other Ambulatory Visit: Payer: Self-pay

## 2019-04-28 ENCOUNTER — Emergency Department
Admission: EM | Admit: 2019-04-28 | Discharge: 2019-04-28 | Disposition: A | Discharge status: Home / Self Care | Payer: Medicare Other | Source: Home / Self Care | Attending: Emergency Medicine | Admitting: Emergency Medicine

## 2019-04-28 DIAGNOSIS — Z7984 Long term (current) use of oral hypoglycemic drugs: Secondary | ICD-10-CM | POA: Insufficient documentation

## 2019-04-28 DIAGNOSIS — E11649 Type 2 diabetes mellitus with hypoglycemia without coma: Secondary | ICD-10-CM | POA: Insufficient documentation

## 2019-04-28 DIAGNOSIS — Z79899 Other long term (current) drug therapy: Secondary | ICD-10-CM | POA: Insufficient documentation

## 2019-04-28 DIAGNOSIS — E1122 Type 2 diabetes mellitus with diabetic chronic kidney disease: Secondary | ICD-10-CM | POA: Insufficient documentation

## 2019-04-28 DIAGNOSIS — J449 Chronic obstructive pulmonary disease, unspecified: Secondary | ICD-10-CM | POA: Insufficient documentation

## 2019-04-28 DIAGNOSIS — E162 Hypoglycemia, unspecified: Secondary | ICD-10-CM | POA: Diagnosis not present

## 2019-04-28 DIAGNOSIS — N184 Chronic kidney disease, stage 4 (severe): Secondary | ICD-10-CM | POA: Insufficient documentation

## 2019-04-28 DIAGNOSIS — I252 Old myocardial infarction: Secondary | ICD-10-CM | POA: Insufficient documentation

## 2019-04-28 DIAGNOSIS — I129 Hypertensive chronic kidney disease with stage 1 through stage 4 chronic kidney disease, or unspecified chronic kidney disease: Secondary | ICD-10-CM | POA: Insufficient documentation

## 2019-04-28 DIAGNOSIS — Z7982 Long term (current) use of aspirin: Secondary | ICD-10-CM | POA: Insufficient documentation

## 2019-04-28 DIAGNOSIS — Z87891 Personal history of nicotine dependence: Secondary | ICD-10-CM | POA: Insufficient documentation

## 2019-04-28 LAB — GLUCOSE, CAPILLARY
Glucose-Capillary: 29 mg/dL — CL (ref 70–99)
Glucose-Capillary: 99 mg/dL (ref 70–99)
Glucose-Capillary: 80 mg/dL (ref 70–99)
Glucose-Capillary: 90 mg/dL (ref 70–99)
Glucose-Capillary: 119 mg/dL — ABNORMAL HIGH (ref 70–99)
Glucose-Capillary: 111 mg/dL — ABNORMAL HIGH (ref 70–99)
Glucose-Capillary: 119 mg/dL — ABNORMAL HIGH (ref 70–99)

## 2019-04-28 LAB — BASIC METABOLIC PANEL
Anion gap: 11 (ref 5–15)
BUN: 61 mg/dL — ABNORMAL HIGH (ref 8–23)
CO2: 16 mmol/L — ABNORMAL LOW (ref 22–32)
Calcium: 7.2 mg/dL — ABNORMAL LOW (ref 8.9–10.3)
Chloride: 107 mmol/L (ref 98–111)
Creatinine, Ser: 2.51 mg/dL — ABNORMAL HIGH (ref 0.61–1.24)
GFR calc Af Amer: 28 mL/min — ABNORMAL LOW (ref >60)
GFR calc non Af Amer: 24 mL/min — ABNORMAL LOW (ref >60)
Glucose, Bld: 147 mg/dL — ABNORMAL HIGH (ref 70–99)
Potassium: 3.9 mmol/L (ref 3.5–5.1)
Sodium: 134 mmol/L — ABNORMAL LOW (ref 135–145)

## 2019-04-28 LAB — URINALYSIS, COMPLETE (UACMP) WITH MICROSCOPIC
Bilirubin Urine: NEGATIVE
Glucose, UA: NEGATIVE mg/dL
Hgb urine dipstick: NEGATIVE
Ketones, ur: NEGATIVE mg/dL
Leukocytes,Ua: NEGATIVE
Nitrite: NEGATIVE
Protein, ur: NEGATIVE mg/dL
Specific Gravity, Urine: 1.008 (ref 1.005–1.030)
pH: 6 (ref 5.0–8.0)

## 2019-04-28 LAB — CBC WITH DIFFERENTIAL/PLATELET
Abs Immature Granulocytes: 0.5 10*3/uL — ABNORMAL HIGH (ref 0.00–0.07)
Basophils Absolute: 0.1 10*3/uL (ref 0.0–0.1)
Basophils Relative: 0 %
Eosinophils Absolute: 0 10*3/uL (ref 0.0–0.5)
Eosinophils Relative: 0 %
HCT: 26.8 % — ABNORMAL LOW (ref 39.0–52.0)
Hemoglobin: 8.6 g/dL — ABNORMAL LOW (ref 13.0–17.0)
Immature Granulocytes: 3 %
Lymphocytes Relative: 4 %
Lymphs Abs: 0.6 10*3/uL — ABNORMAL LOW (ref 0.7–4.0)
MCH: 29.4 pg (ref 26.0–34.0)
MCHC: 32.1 g/dL (ref 30.0–36.0)
MCV: 91.5 fL (ref 80.0–100.0)
Monocytes Absolute: 1.2 10*3/uL — ABNORMAL HIGH (ref 0.1–1.0)
Monocytes Relative: 6 %
Neutro Abs: 15.5 10*3/uL — ABNORMAL HIGH (ref 1.7–7.7)
Neutrophils Relative %: 87 %
Platelets: 166 10*3/uL (ref 150–400)
RBC: 2.93 MIL/uL — ABNORMAL LOW (ref 4.22–5.81)
RDW: 16.7 % — ABNORMAL HIGH (ref 11.5–15.5)
Smear Review: NORMAL
WBC: 17.9 10*3/uL — ABNORMAL HIGH (ref 4.0–10.5)
nRBC: 0 % (ref 0.0–0.2)

## 2019-04-28 MED ORDER — DEXTROSE 50 % IV SOLN
1.0000 | Freq: Once | INTRAVENOUS | Status: AC
  Administered 2019-04-28: 10:00:00 50 mL via INTRAVENOUS

## 2019-04-28 MED ORDER — DEXTROSE 5 % IV SOLN
Freq: Once | INTRAVENOUS | Status: AC
  Administered 2019-04-28: 11:00:00 via INTRAVENOUS

## 2019-04-28 MED ORDER — DEXTROSE 50 % IV SOLN
INTRAVENOUS | Status: AC
  Administered 2019-04-28: 10:00:00 50 mL via INTRAVENOUS
  Filled 2019-04-28: qty 50

## 2019-04-28 NOTE — ED Notes (Signed)
Family at bedside. 

## 2019-04-28 NOTE — ED Notes (Signed)
MD at bedside. 

## 2019-04-28 NOTE — Discharge Instructions (Addendum)
Follow up with your doctor in 3-5 days  Eat frequent small meals

## 2019-04-28 NOTE — ED Notes (Signed)
Pt awake and alert after D50. Pt speech clear and A&Ox4

## 2019-04-28 NOTE — ED Provider Notes (Signed)
[facility] Emergency Department Provider Note   ____________________________________________   I have reviewed the triage vital signs and the nursing notes.   HISTORY  Chief Complaint Altered Mental Status   History limited by and level 5 caveat due to: Altered Mental Status   HPI Bryan Brewer is a 78 y.o. male who presents to the emergency department today because of concern for altered mental status. The patient did arrive via EMS from home. This morning family apparently noticed that the patient was having garbled speech and was more difficult to awaken. Patient states he is unsure what happened this morning.   Records reviewed. Per medical record review patient has a history of COPD, HLD, HTN, DM. Discharge from the hospital 3 days ago for an admisison for GI bleed.   Past Medical History:  Diagnosis Date  . Bladder cancer ([place])   . COPD (chronic obstructive pulmonary disease) ([place])   . Hyperlipidemia   . Hypertension   . Kidney disease   . Type 2 diabetes mellitus [facility])     Patient Active Problem List   Diagnosis Date Noted  . Sinus tachycardia   . GIB (gastrointestinal bleeding) 04/20/2019  . Pressure injury of skin 03/31/2019  . HCAP (healthcare-associated pneumonia) 03/29/2019  . UTI (urinary tract infection) 03/29/2019  . Severe sepsis with septic shock ([place]) 03/29/2019  . Hyponatremia 03/29/2019  . Acute on chronic renal failure ([place]) 02/14/2019  . NSTEMI (non-ST elevated myocardial infarction) ([place]) 10/30/2017  . Diabetes ([place]) 10/30/2017  . CKD (chronic kidney disease), stage IV ([place]) 10/30/2017  . HTN (hypertension) 10/30/2017  . HLD (hyperlipidemia) 10/30/2017  . COPD (chronic obstructive pulmonary disease) ([place]) 10/30/2017    Past Surgical History:  Procedure Laterality Date  . COLONOSCOPY    . ESOPHAGOGASTRODUODENOSCOPY N/A 04/23/2019   Procedure: ESOPHAGOGASTRODUODENOSCOPY (EGD);  Surgeon: Jonathon Bellows, MD;  Location: [facility]  ENDOSCOPY;  Service: Gastroenterology;  Laterality: N/A;  . ESOPHAGOGASTRODUODENOSCOPY (EGD) WITH PROPOFOL N/A 04/21/2019   Procedure: ESOPHAGOGASTRODUODENOSCOPY (EGD) WITH PROPOFOL;  Surgeon: Jonathon Bellows, MD;  Location: [facility] ENDOSCOPY;  Service: Gastroenterology;  Laterality: N/A;  . LEFT HEART CATH AND CORONARY ANGIOGRAPHY N/A 10/31/2017   Procedure: LEFT HEART CATH AND CORONARY ANGIOGRAPHY;  Surgeon: Yolonda Kida, MD;  Location: [place] CV LAB;  Service: Cardiovascular;  Laterality: N/A;  . REVISION UROSTOMY CUTANEOUS      Prior to Admission medications   Medication Sig Start Date End Date Taking? Authorizing Provider  aspirin EC 81 MG tablet Take 81 mg by mouth daily.    [provider]  atorvastatin (LIPITOR) 80 MG tablet Take 80 mg by mouth at bedtime.    [provider]  blood glucose meter kit and supplies KIT Dispense based on patient and insurance preference. Use up to four times daily as directed. (FOR ICD-9 250.00, 250.01). 04/04/19   Gladstone Lighter, MD  furosemide (LASIX) 40 MG tablet Take 40 mg by mouth daily.    [provider]  glimepiride (AMARYL) 2 MG tablet Take 2 mg by mouth daily with breakfast.    [provider]  guaiFENesin (MUCINEX) 600 MG 12 hr tablet Take 1 tablet (600 mg total) by mouth 2 (two) times daily. 04/04/19   Gladstone Lighter, MD  ipratropium-albuterol (DUONEB) 0.5-2.5 (3) MG/3ML SOLN Take 3 mLs by nebulization every 6 (six) hours as needed (wheezing, shortness of breath). 04/04/19   Gladstone Lighter, MD  metoprolol tartrate (LOPRESSOR) 50 MG tablet Take 1 tablet (50 mg total) by mouth 2 (  two) times daily. 04/25/19   Dustin Flock, MD  mometasone-formoterol [facility]) 200-5 MCG/ACT AERO Inhale 2 puffs into the lungs 2 (two) times daily. 04/04/19   Gladstone Lighter, MD  pantoprazole (PROTONIX) 40 MG tablet Take 1 tablet (40 mg total) by mouth daily. 04/25/19 04/24/20  Dustin Flock, MD  sodium bicarbonate 650  MG tablet Take 1,300 mg by mouth 3 (three) times daily.    [provider]    Allergies Patient has no known allergies.  Family History  Problem Relation Age of Onset  . CAD Mother   . Stroke Mother   . CAD Father   . Stroke Father   . Breast cancer Sister     Social History Social History   Tobacco Use  . Smoking status: Former Smoker    Packs/day: 1.50    Years: 35.00    Pack years: 52.50    Types: Cigarettes    Quit date: 2013    Years since quitting: 7.7  . Smokeless tobacco: Never Used  Substance Use Topics  . Alcohol use: No    Frequency: Never  . Drug use: Never    Review of Systems ROS limited secondary to AMS Constitutional: Positive for fatigue.  Cardiovascular: Denies chest pain. Respiratory: Denies shortness of breath. Neurological: Positive for garbled language.   ____________________________________________   PHYSICAL EXAM:  VITAL SIGNS: ED Triage Vitals [04/28/19 0955]  Enc Vitals Group     BP (!) 94/47     Pulse Rate (!) 104     Resp 16     Temp (!) 97.4 F (36.3 C)     Temp Source Oral     SpO2 98 %   Constitutional: Awake and alert.  Eyes: Conjunctivae are normal.  ENT      Head: Normocephalic and atraumatic.      Nose: No congestion/rhinnorhea.      Mouth/Throat: Mucous membranes are moist.      Neck: No stridor. Hematological/Lymphatic/Immunilogical: No cervical lymphadenopathy. Cardiovascular: Normal rate, regular rhythm.  No murmurs, rubs, or gallops.  Respiratory: Normal respiratory effort without tachypnea nor retractions. Breath sounds are clear and equal bilaterally. No wheezes/rales/rhonchi. Gastrointestinal: Soft and non tender. No rebound. No guarding.  Genitourinary: Deferred Musculoskeletal: Normal range of motion in all extremities. No lower extremity edema. Neurologic:  Somewhat garbled speech. Following commands. Awake and alert. Moving all extremities.  Skin:  Skin is warm, dry and intact. No rash  noted. Psychiatric: Mood and affect are normal. Speech and behavior are normal. Patient exhibits appropriate insight and judgment.  ____________________________________________    LABS (pertinent positives/negatives)  BMP na 134, k 3.9, glu 147 BUN 61, Cr 2.51 CBC wbc 17.9, hgb 8.6, plt 166 UA clear, rare bacteria otherwise wnl CBG 29->119  ____________________________________________   EKG  I, Nance Pear, attending physician, personally viewed and interpreted this EKG  EKG Time: 0946 Rate: 86 Rhythm: sinus rhythm Axis: left axis deviation Intervals: qtc 405 QRS: narrow ST changes: no st elevation Impression: abnormal ekg   ____________________________________________    RADIOLOGY  None  ____________________________________________   PROCEDURES  Procedures  ____________________________________________   INITIAL IMPRESSION / ASSESSMENT AND PLAN / ED COURSE  Pertinent labs & imaging results that were available during my care of the patient were reviewed by me and considered in my medical decision making (see chart for details).   Patient presented to the emergency department today because of concerns for altered mental status, garbled speech and weakness.  Patient's initial blood sugar here in  the emergency department 29.  Patient was given D50 with improvement both in clinical picture and blood glucose.  Patient was observed and given an D5 infusion.  The infusion was then stopped and patient was able to maintain normal glucose levels.  His clinical picture did continue to improve and he was at baseline.  Do think that his altered mental status and garbled language is likely secondary to hypoglycemia.  No obvious infectious symptoms or source.  Patient's hemoglobin was also noted to be decreased.  Did have recent admission for GI bleed.  Guaiac was checked and was negative.  Discussed this with the patient.  Patient and family felt very strongly that they would  like to be discharged home.  This point that given that patient is back to clinical baseline and sugars have remained stable I think this is reasonable.  Will have patient follow-up with primary care.   ____________________________________________   FINAL CLINICAL IMPRESSION(S) / ED DIAGNOSES  Final diagnoses:  Hypoglycemia     Note: This dictation was prepared with Dragon dictation. Any transcriptional errors that result from this process are unintentional     Nance Pear, MD 04/29/19 [phone]

## 2019-04-28 NOTE — ED Notes (Signed)
Resumed care from [place] rn.  Pt alert.  Family with pt.  nsr on monitor.

## 2019-04-28 NOTE — ED Triage Notes (Signed)
Pt via EMS from home, per EMS pt recently discharged with hypotension and hyperglycemia. PT was found to be AMS and garbled speech this morning upon awakening by spouse. PT is alert but incomprehensible. VSS

## 2019-04-28 NOTE — ED Notes (Signed)
Pt provided with meal tray and juice. Pt sitting up eating with NAD noted. Will continue to monitor

## 2019-04-28 NOTE — ED Notes (Signed)
Per EMS glucose 67, glucose check 29 at this time, verbal order given for d50

## 2019-04-28 NOTE — ED Notes (Signed)
Glucose infusion stopped at this time per MD Archie Balboa, will recheck glucose in 30-45min.

## 2019-04-28 NOTE — ED Notes (Signed)
Pt given urinal at this time for sample. Pt states he will provide soon

## 2019-04-29 ENCOUNTER — Emergency Department: Payer: Medicare Other

## 2019-04-29 ENCOUNTER — Inpatient Hospital Stay
Admission: EM | Admit: 2019-04-29 | Discharge: 2019-05-01 | DRG: 638 | Disposition: A | Discharge status: Home / Self Care | Payer: Medicare Other | Attending: Specialist | Admitting: Specialist

## 2019-04-29 ENCOUNTER — Other Ambulatory Visit: Payer: Self-pay

## 2019-04-29 ENCOUNTER — Encounter: Payer: Self-pay | Admitting: Emergency Medicine

## 2019-04-29 DIAGNOSIS — K219 Gastro-esophageal reflux disease without esophagitis: Secondary | ICD-10-CM | POA: Diagnosis present

## 2019-04-29 DIAGNOSIS — N184 Chronic kidney disease, stage 4 (severe): Secondary | ICD-10-CM | POA: Diagnosis present

## 2019-04-29 DIAGNOSIS — E1122 Type 2 diabetes mellitus with diabetic chronic kidney disease: Secondary | ICD-10-CM | POA: Diagnosis present

## 2019-04-29 DIAGNOSIS — Z20828 Contact with and (suspected) exposure to other viral communicable diseases: Secondary | ICD-10-CM | POA: Diagnosis present

## 2019-04-29 DIAGNOSIS — Z7984 Long term (current) use of oral hypoglycemic drugs: Secondary | ICD-10-CM | POA: Diagnosis not present

## 2019-04-29 DIAGNOSIS — Z87891 Personal history of nicotine dependence: Secondary | ICD-10-CM | POA: Diagnosis not present

## 2019-04-29 DIAGNOSIS — N189 Chronic kidney disease, unspecified: Secondary | ICD-10-CM

## 2019-04-29 DIAGNOSIS — I129 Hypertensive chronic kidney disease with stage 1 through stage 4 chronic kidney disease, or unspecified chronic kidney disease: Secondary | ICD-10-CM | POA: Diagnosis present

## 2019-04-29 DIAGNOSIS — Z79899 Other long term (current) drug therapy: Secondary | ICD-10-CM | POA: Diagnosis not present

## 2019-04-29 DIAGNOSIS — Z951 Presence of aortocoronary bypass graft: Secondary | ICD-10-CM

## 2019-04-29 DIAGNOSIS — E785 Hyperlipidemia, unspecified: Secondary | ICD-10-CM | POA: Diagnosis present

## 2019-04-29 DIAGNOSIS — M5416 Radiculopathy, lumbar region: Secondary | ICD-10-CM

## 2019-04-29 DIAGNOSIS — G8929 Other chronic pain: Secondary | ICD-10-CM

## 2019-04-29 DIAGNOSIS — N179 Acute kidney failure, unspecified: Secondary | ICD-10-CM | POA: Diagnosis present

## 2019-04-29 DIAGNOSIS — J449 Chronic obstructive pulmonary disease, unspecified: Secondary | ICD-10-CM | POA: Diagnosis present

## 2019-04-29 DIAGNOSIS — D72829 Elevated white blood cell count, unspecified: Secondary | ICD-10-CM | POA: Diagnosis not present

## 2019-04-29 DIAGNOSIS — Z7982 Long term (current) use of aspirin: Secondary | ICD-10-CM | POA: Diagnosis not present

## 2019-04-29 DIAGNOSIS — M545 Low back pain: Secondary | ICD-10-CM

## 2019-04-29 DIAGNOSIS — G9341 Metabolic encephalopathy: Secondary | ICD-10-CM | POA: Diagnosis not present

## 2019-04-29 DIAGNOSIS — K0889 Other specified disorders of teeth and supporting structures: Secondary | ICD-10-CM

## 2019-04-29 DIAGNOSIS — I878 Other specified disorders of veins: Secondary | ICD-10-CM

## 2019-04-29 DIAGNOSIS — Z7951 Long term (current) use of inhaled steroids: Secondary | ICD-10-CM | POA: Diagnosis not present

## 2019-04-29 DIAGNOSIS — E11649 Type 2 diabetes mellitus with hypoglycemia without coma: Secondary | ICD-10-CM | POA: Diagnosis present

## 2019-04-29 DIAGNOSIS — E162 Hypoglycemia, unspecified: Secondary | ICD-10-CM

## 2019-04-29 DIAGNOSIS — R197 Diarrhea, unspecified: Secondary | ICD-10-CM

## 2019-04-29 DIAGNOSIS — A0472 Enterocolitis due to Clostridium difficile, not specified as recurrent: Secondary | ICD-10-CM | POA: Diagnosis present

## 2019-04-29 DIAGNOSIS — Z932 Ileostomy status: Secondary | ICD-10-CM | POA: Diagnosis not present

## 2019-04-29 DIAGNOSIS — Z8719 Personal history of other diseases of the digestive system: Secondary | ICD-10-CM

## 2019-04-29 DIAGNOSIS — Z8551 Personal history of malignant neoplasm of bladder: Secondary | ICD-10-CM | POA: Diagnosis not present

## 2019-04-29 DIAGNOSIS — N132 Hydronephrosis with renal and ureteral calculous obstruction: Secondary | ICD-10-CM

## 2019-04-29 DIAGNOSIS — K469 Unspecified abdominal hernia without obstruction or gangrene: Secondary | ICD-10-CM | POA: Diagnosis not present

## 2019-04-29 DIAGNOSIS — I2510 Atherosclerotic heart disease of native coronary artery without angina pectoris: Secondary | ICD-10-CM | POA: Diagnosis present

## 2019-04-29 DIAGNOSIS — I959 Hypotension, unspecified: Secondary | ICD-10-CM | POA: Diagnosis present

## 2019-04-29 DIAGNOSIS — E861 Hypovolemia: Secondary | ICD-10-CM

## 2019-04-29 DIAGNOSIS — Z9889 Other specified postprocedural states: Secondary | ICD-10-CM

## 2019-04-29 DIAGNOSIS — Z936 Other artificial openings of urinary tract status: Secondary | ICD-10-CM

## 2019-04-29 LAB — BASIC METABOLIC PANEL
Anion gap: 10 (ref 5–15)
BUN: 60 mg/dL — ABNORMAL HIGH (ref 8–23)
CO2: 20 mmol/L — ABNORMAL LOW (ref 22–32)
Calcium: 7.7 mg/dL — ABNORMAL LOW (ref 8.9–10.3)
Chloride: 106 mmol/L (ref 98–111)
Creatinine, Ser: 2.65 mg/dL — ABNORMAL HIGH (ref 0.61–1.24)
GFR calc Af Amer: 26 mL/min — ABNORMAL LOW (ref >60)
GFR calc non Af Amer: 22 mL/min — ABNORMAL LOW (ref >60)
Glucose, Bld: 73 mg/dL (ref 70–99)
Potassium: 4.3 mmol/L (ref 3.5–5.1)
Sodium: 136 mmol/L (ref 135–145)

## 2019-04-29 LAB — CBC WITH DIFFERENTIAL/PLATELET
Abs Immature Granulocytes: 0.79 10*3/uL — ABNORMAL HIGH (ref 0.00–0.07)
Basophils Absolute: 0.1 10*3/uL (ref 0.0–0.1)
Basophils Relative: 0 %
Eosinophils Absolute: 0 10*3/uL (ref 0.0–0.5)
Eosinophils Relative: 0 %
HCT: 32.6 % — ABNORMAL LOW (ref 39.0–52.0)
Hemoglobin: 10.5 g/dL — ABNORMAL LOW (ref 13.0–17.0)
Immature Granulocytes: 4 %
Lymphocytes Relative: 5 %
Lymphs Abs: 1.1 10*3/uL (ref 0.7–4.0)
MCH: 28.8 pg (ref 26.0–34.0)
MCHC: 32.2 g/dL (ref 30.0–36.0)
MCV: 89.6 fL (ref 80.0–100.0)
Monocytes Absolute: 1.2 10*3/uL — ABNORMAL HIGH (ref 0.1–1.0)
Monocytes Relative: 6 %
Neutro Abs: 17.1 10*3/uL — ABNORMAL HIGH (ref 1.7–7.7)
Neutrophils Relative %: 85 %
Platelets: 218 10*3/uL (ref 150–400)
RBC: 3.64 MIL/uL — ABNORMAL LOW (ref 4.22–5.81)
RDW: 17 % — ABNORMAL HIGH (ref 11.5–15.5)
Smear Review: NORMAL
WBC: 20.3 10*3/uL — ABNORMAL HIGH (ref 4.0–10.5)
nRBC: 0 % (ref 0.0–0.2)

## 2019-04-29 LAB — GLUCOSE, CAPILLARY
Glucose-Capillary: 39 mg/dL — CL (ref 70–99)
Glucose-Capillary: 41 mg/dL — CL (ref 70–99)
Glucose-Capillary: 104 mg/dL — ABNORMAL HIGH (ref 70–99)
Glucose-Capillary: 97 mg/dL (ref 70–99)
Glucose-Capillary: 113 mg/dL — ABNORMAL HIGH (ref 70–99)
Glucose-Capillary: 138 mg/dL — ABNORMAL HIGH (ref 70–99)
Glucose-Capillary: 158 mg/dL — ABNORMAL HIGH (ref 70–99)
Glucose-Capillary: 162 mg/dL — ABNORMAL HIGH (ref 70–99)
Glucose-Capillary: 198 mg/dL — ABNORMAL HIGH (ref 70–99)

## 2019-04-29 LAB — C DIFFICILE QUICK SCREEN W PCR REFLEX
C Diff antigen: POSITIVE — AB
C Diff toxin: NEGATIVE

## 2019-04-29 LAB — CLOSTRIDIUM DIFFICILE BY PCR, REFLEXED: Toxigenic C. Difficile by PCR: POSITIVE — AB

## 2019-04-29 LAB — CORTISOL: Cortisol, Plasma: 19.4 ug/dL

## 2019-04-29 MED ORDER — SODIUM CHLORIDE 0.9 % IV SOLN
2.0000 g | INTRAVENOUS | Status: DC
  Administered 2019-04-29: 2 g via INTRAVENOUS
  Filled 2019-04-29 (×2): qty 2

## 2019-04-29 MED ORDER — DEXTROSE 50 % IV SOLN
25.0000 g | Freq: Once | INTRAVENOUS | Status: AC
  Administered 2019-04-29: 10:00:00 25 g via INTRAVENOUS

## 2019-04-29 MED ORDER — VANCOMYCIN HCL IN DEXTROSE 1-5 GM/200ML-% IV SOLN
1000.0000 mg | Freq: Once | INTRAVENOUS | Status: AC
  Administered 2019-04-29: 1000 mg via INTRAVENOUS
  Filled 2019-04-29: qty 200

## 2019-04-29 MED ORDER — DEXTROSE 5 % IV SOLN
Freq: Once | INTRAVENOUS | Status: AC
  Administered 2019-04-29: 11:00:00 via INTRAVENOUS

## 2019-04-29 MED ORDER — DOCUSATE SODIUM 100 MG PO CAPS: 100.0000 mg | ORAL_CAPSULE | Freq: Two times a day (BID) | ORAL | Status: DC | PRN

## 2019-04-29 MED ORDER — IPRATROPIUM-ALBUTEROL 0.5-2.5 (3) MG/3ML IN SOLN: 3.0000 mL | Freq: Four times a day (QID) | RESPIRATORY_TRACT | Status: DC | PRN

## 2019-04-29 MED ORDER — PIPERACILLIN-TAZOBACTAM 3.375 G IVPB 30 MIN
3.3750 g | Freq: Once | INTRAVENOUS | Status: AC
  Administered 2019-04-29: 13:00:00 3.375 g via INTRAVENOUS
  Filled 2019-04-29: qty 50

## 2019-04-29 MED ORDER — HEPARIN SODIUM (PORCINE) 5000 UNIT/ML IJ SOLN
5000.0000 [IU] | Freq: Three times a day (TID) | INTRAMUSCULAR | Status: DC
  Administered 2019-04-29 – 2019-05-01 (×5): 5000 [IU] via SUBCUTANEOUS
  Filled 2019-04-29 (×5): qty 1

## 2019-04-29 MED ORDER — MOMETASONE FURO-FORMOTEROL FUM 200-5 MCG/ACT IN AERO
2.0000 | INHALATION_SPRAY | Freq: Two times a day (BID) | RESPIRATORY_TRACT | Status: DC
  Administered 2019-04-29 – 2019-05-01 (×4): 2 via RESPIRATORY_TRACT
  Filled 2019-04-29: qty 8.8

## 2019-04-29 MED ORDER — DEXTROSE 5 % IV SOLN: INTRAVENOUS | Status: DC

## 2019-04-29 MED ORDER — PANTOPRAZOLE SODIUM 40 MG PO TBEC
40.0000 mg | DELAYED_RELEASE_TABLET | Freq: Every day | ORAL | Status: DC
  Administered 2019-04-29 – 2019-05-01 (×3): 40 mg via ORAL
  Filled 2019-04-29 (×3): qty 1

## 2019-04-29 MED ORDER — ATORVASTATIN CALCIUM 20 MG PO TABS
80.0000 mg | ORAL_TABLET | Freq: Every day | ORAL | Status: DC
  Administered 2019-04-29 – 2019-04-30 (×2): 80 mg via ORAL
  Filled 2019-04-29 (×2): qty 4

## 2019-04-29 MED ORDER — DEXTROSE 50 % IV SOLN
INTRAVENOUS | Status: AC
  Administered 2019-04-29: 25 g via INTRAVENOUS
  Filled 2019-04-29: qty 50

## 2019-04-29 MED ORDER — SODIUM CHLORIDE 0.9 % IV BOLUS
1000.0000 mL | Freq: Once | INTRAVENOUS | Status: AC
  Administered 2019-04-29: 1000 mL via INTRAVENOUS

## 2019-04-29 MED ORDER — VANCOMYCIN HCL IN DEXTROSE 1-5 GM/200ML-% IV SOLN: 1000.0000 mg | INTRAVENOUS | Status: DC

## 2019-04-29 MED ORDER — ASPIRIN EC 81 MG PO TBEC
81.0000 mg | DELAYED_RELEASE_TABLET | Freq: Every evening | ORAL | Status: DC
  Administered 2019-04-29 – 2019-04-30 (×2): 81 mg via ORAL
  Filled 2019-04-29 (×2): qty 1

## 2019-04-29 NOTE — Progress Notes (Signed)
Family Meeting Note  Advance Directive: Yes  Today a meeting took place with the Patient.   The following clinical team members were present during this meeting: MD.  The following were discussed:Patient's diagnosis: Sepsis, hypoglycemia, chronic kidney disease, hypotension, diarrhea, Patient's progosis: Unable to determine and Goals for treatment: Partial code with years for CPR and defibrillator use but does not want to have intubation or ventilatory support.  Additional follow-up to be provided: Infectious disease.  Time spent during discussion: 16 minutes.  Vaughan Basta, MD

## 2019-04-29 NOTE — H&P (Signed)
[place] [facility] [place] NAME: Bryan Brewer    MR#:  [id number]  DATE OF BIRTH:  1941-01-10  DATE OF ADMISSION:  04/29/2019  PRIMARY CARE PHYSICIAN: Tracie Harrier, MD   REQUESTING/REFERRING PHYSICIAN: Archie Balboa  CHIEF COMPLAINT:   Chief Complaint  Patient presents with  . Hypoglycemia    HISTORY OF PRESENT ILLNESS: Bryan Brewer  is a 78 y.o. male with a known history of bladder cancer and status post resection, COPD, hyperlipidemia, hypertension, kidney disease, type 2 diabetes mellitus-was admitted to hospital and discharged 4 days ago with GI bleed and anemia with transfusions and IV fluids and endoscopy done with findings of [place] lesions and banding. He lives at home and walks without any support.  Yesterday morning his wife noted him to be lethargic and confused so sent to emergency room where he was noted to have hypoglycemia.  His infection work-up was done in emergency room which was all negative.  After receiving some IV fluid and dextrose patient improved and he was feeling better so discharged home. Today again he had similar episode, where he was unable to arouse and some confusion so wife called EMS they noted to have low blood sugar and they brought him to the emergency room.  Patient says he takes pills for his diabetes every morning but yesterday being emergency room he did not take the pills in the morning and he took it after he went home in the late afternoon and he did not eat anything in the evening. In emergency room today he was also noted to have significant elevation in white blood cell count, hypotension, hypoglycemia, but his urinalysis and chest x-ray were negative so he was suspected to have sepsis of unknown origin.  ER physician started on broad-spectrum antibiotic and advised to admit to hospitalist service. Later nurse reported that patient has 3 watery bowel movements in the last 24 hours.  PAST MEDICAL HISTORY:    Past Medical History:  Diagnosis Date  . Bladder cancer ([place])   . COPD (chronic obstructive pulmonary disease) ([place])   . Hyperlipidemia   . Hypertension   . Kidney disease   . Type 2 diabetes mellitus ([place])     PAST SURGICAL HISTORY:  Past Surgical History:  Procedure Laterality Date  . COLONOSCOPY    . ESOPHAGOGASTRODUODENOSCOPY N/A 04/23/2019   Procedure: ESOPHAGOGASTRODUODENOSCOPY (EGD);  Surgeon: Jonathon Bellows, MD;  Location: [facility] ENDOSCOPY;  Service: Gastroenterology;  Laterality: N/A;  . ESOPHAGOGASTRODUODENOSCOPY (EGD) WITH PROPOFOL N/A 04/21/2019   Procedure: ESOPHAGOGASTRODUODENOSCOPY (EGD) WITH PROPOFOL;  Surgeon: Jonathon Bellows, MD;  Location: [facility] ENDOSCOPY;  Service: Gastroenterology;  Laterality: N/A;  . LEFT HEART CATH AND CORONARY ANGIOGRAPHY N/A 10/31/2017   Procedure: LEFT HEART CATH AND CORONARY ANGIOGRAPHY;  Surgeon: Yolonda Kida, MD;  Location: [place] CV LAB;  Service: Cardiovascular;  Laterality: N/A;  . REVISION UROSTOMY CUTANEOUS      SOCIAL HISTORY:  Social History   Tobacco Use  . Smoking status: Former Smoker    Packs/day: 1.50    Years: 35.00    Pack years: 52.50    Types: Cigarettes    Quit date: 2013    Years since quitting: 7.7  . Smokeless tobacco: Never Used  Substance Use Topics  . Alcohol use: No    Frequency: Never    FAMILY HISTORY:  Family History  Problem Relation Age of Onset  . CAD Mother   . Stroke Mother   . CAD Father   .  Stroke Father   . Breast cancer Sister     DRUG ALLERGIES: No Known Allergies  REVIEW OF SYSTEMS:   CONSTITUTIONAL: No fever, have fatigue or weakness.  EYES: No blurred or double vision.  EARS, NOSE, AND THROAT: No tinnitus or ear pain.  RESPIRATORY: No cough, shortness of breath, wheezing or hemoptysis.  CARDIOVASCULAR: No chest pain, orthopnea, edema.  GASTROINTESTINAL: No nausea, vomiting, diarrhea or abdominal pain.  GENITOURINARY: No dysuria, hematuria.  ENDOCRINE: No polyuria,  nocturia,  HEMATOLOGY: No anemia, easy bruising or bleeding SKIN: No rash or lesion. MUSCULOSKELETAL: No joint pain or arthritis.   NEUROLOGIC: No tingling, numbness, weakness.  PSYCHIATRY: No anxiety or depression.   MEDICATIONS AT HOME:  Prior to Admission medications   Medication Sig Start Date End Date Taking? Authorizing Provider  aspirin EC 81 MG tablet Take 81 mg by mouth every evening.    Yes [provider]  atorvastatin (LIPITOR) 80 MG tablet Take 80 mg by mouth at bedtime.   Yes [provider]  furosemide (LASIX) 40 MG tablet Take 40 mg by mouth daily.   Yes [provider]  glimepiride (AMARYL) 2 MG tablet Take 2 mg by mouth daily with breakfast.   Yes [provider]  metoprolol tartrate (LOPRESSOR) 50 MG tablet Take 1 tablet (50 mg total) by mouth 2 (two) times daily. 04/25/19  Yes Dustin Flock, MD  mometasone-formoterol [facility]) 200-5 MCG/ACT AERO Inhale 2 puffs into the lungs 2 (two) times daily. 04/04/19  Yes Gladstone Lighter, MD  pantoprazole (PROTONIX) 40 MG tablet Take 1 tablet (40 mg total) by mouth daily. 04/25/19 04/24/20 Yes Dustin Flock, MD  sodium bicarbonate 650 MG tablet Take 1,300 mg by mouth 3 (three) times daily.   Yes [provider]  blood glucose meter kit and supplies KIT Dispense based on patient and insurance preference. Use up to four times daily as directed. (FOR ICD-9 250.00, 250.01). 04/04/19   Gladstone Lighter, MD  guaiFENesin (MUCINEX) 600 MG 12 hr tablet Take 1 tablet (600 mg total) by mouth 2 (two) times daily. Patient not taking: Reported on 04/29/2019 04/04/19   Gladstone Lighter, MD  ipratropium-albuterol (DUONEB) 0.5-2.5 (3) MG/3ML SOLN Take 3 mLs by nebulization every 6 (six) hours as needed (wheezing, shortness of breath). 04/04/19   Gladstone Lighter, MD      PHYSICAL EXAMINATION:   VITAL SIGNS: Blood pressure (!) 110/52, pulse 85, temperature 98.7 F (37.1 C), temperature source Oral,  resp. rate 20, height 5' 7"  (1.702 m), weight 70 kg, SpO2 99 %.  GENERAL:  78 y.o.-year-old patient lying in the bed with no acute distress.  EYES: Pupils equal, round, reactive to light and accommodation. No scleral icterus. Extraocular muscles intact.  HEENT: Head atraumatic, normocephalic. Oropharynx and nasopharynx clear.  NECK:  Supple, no jugular venous distention. No thyroid enlargement, no tenderness.  LUNGS: Normal breath sounds bilaterally, no wheezing, rales,rhonchi or crepitation. No use of accessory muscles of respiration.  CARDIOVASCULAR: S1, S2 normal. No murmurs, rubs, or gallops.  ABDOMEN: Soft, nontender, nondistended. Bowel sounds present. No organomegaly or mass.  Left lower abdominal wall having ilial conduit with drainage bag and catheter draining his urine. EXTREMITIES: Some pedal edema, cyanosis, or clubbing.  NEUROLOGIC: Cranial nerves II through XII are intact. Muscle strength 5/5 in all extremities. Sensation intact. Gait not checked.  PSYCHIATRIC: The patient is alert and oriented x 3.  SKIN: No obvious rash, lesion, or ulcer.   LABORATORY PANEL:   CBC Recent Labs  Lab  04/24/19 0917 04/24/19 1504 04/25/19 0938 04/28/19 1002 04/29/19 1018  WBC 13.3* 14.3* 14.7* 17.9* 20.3*  HGB 10.6* 9.9* 11.1* 8.6* 10.5*  HCT 34.3* 30.3* 36.2* 26.8* 32.6*  PLT 182 172 190 166 218  MCV 94.8 89.1 93.5 91.5 89.6  MCH 29.3 29.1 28.7 29.4 28.8  MCHC 30.9 32.7 30.7 32.1 32.2  RDW 19.0* 18.4* 18.4* 16.7* 17.0*  LYMPHSABS  --   --  1.4 0.6* 1.1  MONOABS  --   --  0.8 1.2* 1.2*  EOSABS  --   --  0.2 0.0 0.0  BASOSABS  --   --  0.1 0.1 0.1   ------------------------------------------------------------------------------------------------------------------  Chemistries  Recent Labs  Lab 04/28/19 1002 04/29/19 1018  NA 134* 136  K 3.9 4.3  CL 107 106  CO2 16* 20*  GLUCOSE 147* 73  BUN 61* 60*  CREATININE 2.51* 2.65*  CALCIUM 7.2* 7.7*    ------------------------------------------------------------------------------------------------------------------ estimated creatinine clearance is 21.8 mL/min (A) (by C-G formula based on SCr of 2.65 mg/dL (H)). ------------------------------------------------------------------------------------------------------------------ No results for input(s): TSH, T4TOTAL, T3FREE, THYROIDAB in the last 72 hours.  Invalid input(s): FREET3   Coagulation profile No results for input(s): INR, PROTIME in the last 168 hours. ------------------------------------------------------------------------------------------------------------------- No results for input(s): DDIMER in the last 72 hours. -------------------------------------------------------------------------------------------------------------------  Cardiac Enzymes No results for input(s): CKMB, TROPONINI, MYOGLOBIN in the last 168 hours.  Invalid input(s): CK ------------------------------------------------------------------------------------------------------------------ Invalid input(s): POCBNP  ---------------------------------------------------------------------------------------------------------------  Urinalysis    Component Value Date/Time   COLORURINE YELLOW (A) 04/28/2019 1002   APPEARANCEUR CLEAR (A) 04/28/2019 1002   LABSPEC 1.008 04/28/2019 1002   PHURINE 6.0 04/28/2019 1002   GLUCOSEU NEGATIVE 04/28/2019 1002   HGBUR NEGATIVE 04/28/2019 1002   BILIRUBINUR NEGATIVE 04/28/2019 1002   KETONESUR NEGATIVE 04/28/2019 1002   PROTEINUR NEGATIVE 04/28/2019 1002   NITRITE NEGATIVE 04/28/2019 1002   LEUKOCYTESUR NEGATIVE 04/28/2019 1002     RADIOLOGY: Dg Chest Portable 1 View  Result Date: 04/29/2019 CLINICAL DATA:  Altered mental status EXAM: PORTABLE CHEST 1 VIEW COMPARISON:  04/20/2019 FINDINGS: There is bilateral chronic interstitial lung disease. There is no focal consolidation. There is no pleural effusion or  pneumothorax. The heart and mediastinal contours are unremarkable. There is evidence of prior CABG. There is no acute osseous abnormality. IMPRESSION: No active disease. Electronically Signed   By: Kathreen Devoid   On: 04/29/2019 12:09    EKG: Orders placed or performed during the hospital encounter of 04/29/19  . EKG 12-Lead  . EKG 12-Lead  . EKG 12-Lead  . EKG 12-Lead    IMPRESSION AND PLAN:  *Hypoglycemia Hold any antidiabetic medication Keep on dextrose 5% IV drip and frequent glucose checks.  *Sepsis Patient meets sepsis criteria on admission but there is no clear source. Patient is reported to have watery diarrhea. Sent for stool studies. Currently continue IV broad-spectrum antibiotics.  *Chronic renal failure stage IV Appears to be stable.  Continue to monitor and avoid nephrotoxic medications.  *Hypertension Patient takes metoprolol, furosemide at home Blood pressure is running lower on admission so would like to hold antihypertensive medications at this time.  *Gastroesophageal reflux disease with recent GI bleed and procedure. Continue Protonix.  *Hyperlipidemia Continue atorvastatin.  *Diabetes mellitus with complication of hypoglycemia Check for TSH and cortisol level. Meanwhile hold oral anti-diabetic medications and keep on dextrose drip as mentioned above.  *COPD Currently no exacerbation symptoms, continue Dulera.  All the records are reviewed and case discussed with ED provider. Management plans discussed with the  patient, family and they are in agreement.  CODE STATUS: Partial as mentioned below.    Code Status Orders  (From admission, onward)         Start     Ordered   04/29/19 1759  Limited resuscitation (code)  Continuous    Question Answer Comment  In the event of cardiac or respiratory ARREST: Initiate Code Blue, Call Rapid Response Yes   In the event of cardiac or respiratory ARREST: Perform CPR Yes   In the event of cardiac or  respiratory ARREST: Perform Intubation/Mechanical Ventilation No   In the event of cardiac or respiratory ARREST: Use NIPPV/BiPAp only if indicated Yes   In the event of cardiac or respiratory ARREST: Administer ACLS medications if indicated Yes   In the event of cardiac or respiratory ARREST: Perform Defibrillation or Cardioversion if indicated Yes      04/29/19 1758        Code Status History    Date Active Date Inactive Code Status Order ID Comments User Context   04/20/2019 2038 04/25/2019 1918 Full Code [id number]  Bettey Costa, MD Inpatient   03/30/2019 0124 04/04/2019 1724 Full Code [id number]  Lance Coon, MD Inpatient   02/14/2019 2330 02/16/2019 1909 Full Code [id number]  Henreitta Leber, MD Inpatient   10/30/2017 0203 11/01/2017 2259 Full Code [id number]  Lance Coon, MD Inpatient   Advance Care Planning Activity       TOTAL TIME TAKING CARE OF THIS PATIENT: 45 minutes.    Vaughan Basta M.D on 04/29/2019   Between 7am to 6pm - Pager - [phone]  After 6pm go to [email or web address] - password [email or web address] [place] [facility]  Office  [phone]  CC: Primary care physician; Tracie Harrier, MD   Note: This dictation was prepared with Dragon dictation along with smaller phrase technology. Any transcriptional errors that result from this process are unintentional.

## 2019-04-29 NOTE — Consult Note (Signed)
Pharmacy Antibiotic Note  Bryan Brewer is a 78 y.o. male admitted on 04/29/2019 with sepsis.  Pharmacy has been consulted for Vancomycin and Cefepime dosing. Patient has history of CKD and baseline Scr seems to be around 2.4-2.6. Current Scr is 2.65. Also received 1 time dose of Zosyn 3.375g IV in ED.  Plan: 1) Patient received loading dose of Vancomycin 1g IV in the ED, which was inadequate for patient's height and weight. Will initiate maintenance approximately 10 hours early due to inadequate loading dose.  Due to patient's poor renal function and history of CKD, will order maintenance dose of Vancomycin 1000 mg IV Q 48 hrs to start 9/25@0400 .  Goal AUC 400-550. Expected AUC: 440.6 Expected Css: 10.4 SCr used: 2.65  2) Cefepime 2g IV Q24 hours due to CrCl between 11-14ml/min   Height: 5\' 7"  (170.2 cm) Weight: 154 lb 5.2 oz (70 kg) IBW/kg (Calculated) : 66.1  Temp (24hrs), Avg:97.4 F (36.3 C), Min:97.4 F (36.3 C), Max:97.4 F (36.3 C)  Recent Labs  Lab 04/24/19 0917 04/24/19 1504 04/25/19 0938 04/28/19 1002 04/29/19 1018  WBC 13.3* 14.3* 14.7* 17.9* 20.3*  CREATININE  --   --   --  2.51* 2.65*    Estimated Creatinine Clearance: 21.8 mL/min (A) (by C-G formula based on SCr of 2.65 mg/dL (H)).    No Known Allergies  Antimicrobials this admission: Vancomycin 9/23 >>  Cefepime 9/23 >>  Zosyn 9/23 x 1   Microbiology results: 9/23 BCx: pending   Thank you for allowing pharmacy to be a part of this patient's care.  [name] A Eisha Chatterjee 04/29/2019 3:52 PM

## 2019-04-29 NOTE — Progress Notes (Addendum)
Inpatient Diabetes Program Recommendations  AACE/ADA: New Consensus Statement on Inpatient Glycemic Control (2015)  Target Ranges:  Prepandial:   less than 140 mg/dL      Peak postprandial:   less than 180 mg/dL (1-2 hours)      Critically ill patients:  140 - 180 mg/dL   Lab Results  Component Value Date   GLUCAP 97 04/29/2019   HGBA1C 6.5 (H) 04/20/2019    Review of Glycemic Control  Diabetes history: DM 2 Outpatient Diabetes medications: Glimepiride 2 mg Daily  Current orders for Inpatient glycemic control:  None recurrent hypoglycemia  Inpatient Diabetes Program Recommendations:    Renal function elevated. Patient on Glimepiride outpatient for glucose control. This class of medication is known for hypoglycemia.   Pt may need to d/c Glimepiride at time of discharge, check CBGs (twice a day) and follow up with his primary physician. If glucose becomes greater than 180 consistently call PCP if before scheduled appointment.  Thanks,  Tama Headings RN, MSN, BC-ADM Inpatient Diabetes Coordinator Team Pager [phone] (8a-5p)

## 2019-04-29 NOTE — ED Notes (Signed)
ED TO INPATIENT HANDOFF REPORT  ED Nurse Name and Phone #: Antonieta Pert, RN  S Name/Age/Gender Bryan Brewer 78 y.o. male Room/Bed: ED14A/ED14A  Code Status   Code Status: Prior  Home/SNF/Other Home Patient oriented to: self, place, time and situation Is this baseline? Yes   Triage Complete: Triage complete  Chief Complaint low blood sugar  Triage Note Pt ems from home for AMS. Per ems wife unable to wake pt. Ems found cbg low and gave glucagon. Repeat cbg was 61. Here cbg 41 and 39. 1 amp d50 given.    Allergies No Known Allergies  Level of Care/Admitting Diagnosis ED Disposition    ED Disposition Condition [facility] Area: [place] [100120]  Level of Care: Med-Surg [16]  Covid Evaluation: Asymptomatic Screening Protocol (No Symptoms)  Diagnosis: Hypoglycemia [761607]  Admitting Physician: Vaughan Basta [phone]  Attending Physician: Vaughan Basta [phone]  Estimated length of stay: past midnight tomorrow  Certification:: I certify this patient will need inpatient services for at least 2 midnights  PT Class (Do Not Modify): Inpatient [101]  PT Acc Code (Do Not Modify): Private [1]       B Medical/Surgery History Past Medical History:  Diagnosis Date  . Bladder cancer ([place])   . COPD (chronic obstructive pulmonary disease) ([place])   . Hyperlipidemia   . Hypertension   . Kidney disease   . Type 2 diabetes mellitus ([place])    Past Surgical History:  Procedure Laterality Date  . COLONOSCOPY    . ESOPHAGOGASTRODUODENOSCOPY N/A 04/23/2019   Procedure: ESOPHAGOGASTRODUODENOSCOPY (EGD);  Surgeon: Jonathon Bellows, MD;  Location[facility] ENDOSCOPY;  Service: Gastroenterology;  Laterality: N/A;  . ESOPHAGOGASTRODUODENOSCOPY (EGD) WITH PROPOFOL N/A 04/21/2019   Procedure: ESOPHAGOGASTRODUODENOSCOPY (EGD) WITH PROPOFOL;  Surgeon: Jonathon Bellows, MD;  Location: [facility] ENDOSCOPY;  Service: Gastroenterology;  Laterality: N/A;   . LEFT HEART CATH AND CORONARY ANGIOGRAPHY N/A 10/31/2017   Procedure: LEFT HEART CATH AND CORONARY ANGIOGRAPHY;  Surgeon: Yolonda Kida, MD;  Location: [place] CV LAB;  Service: Cardiovascular;  Laterality: N/A;  . REVISION UROSTOMY CUTANEOUS       A IV Location/Drains/Wounds Patient Lines/Drains/Airways Status   Active Line/Drains/Airways    Name:   Placement date:   Placement time:   Site:   Days:   Peripheral IV 04/29/19 Left Wrist   04/29/19    1035    Wrist   less than 1   Peripheral IV 04/29/19 Left Antecubital   04/29/19    1226    Antecubital   less than 1   Urostomy Ureterostomy left LLQ   03/30/19    0130    LLQ   30          Intake/Output Last 24 hours  Intake/Output Summary (Last 24 hours) at 04/29/2019 1647 Last data filed at 04/29/2019 1643 Gross per 24 hour  Intake -  Output 1300 ml  Net -1300 ml    Labs/Imaging Results for orders placed or performed during the hospital encounter of 04/29/19 (from the past 48 hour(s))  Glucose, capillary     Status: Abnormal   Collection Time: 04/29/19 10:10 AM  Result Value Ref Range   Glucose-Capillary 41 (LL) 70 - 99 mg/dL   Comment 1 Notify RN   Glucose, capillary     Status: Abnormal   Collection Time: 04/29/19 10:13 AM  Result Value Ref Range   Glucose-Capillary 39 (LL) 70 - 99 mg/dL  CBC with Differential  Status: Abnormal   Collection Time: 04/29/19 10:18 AM  Result Value Ref Range   WBC 20.3 (H) 4.0 - 10.5 K/uL   RBC 3.64 (L) 4.22 - 5.81 MIL/uL   Hemoglobin 10.5 (L) 13.0 - 17.0 g/dL   HCT 32.6 (L) 39.0 - 52.0 %   MCV 89.6 80.0 - 100.0 fL   MCH 28.8 26.0 - 34.0 pg   MCHC 32.2 30.0 - 36.0 g/dL   RDW 17.0 (H) 11.5 - 15.5 %   Platelets 218 150 - 400 K/uL   nRBC 0.0 0.0 - 0.2 %   Neutrophils Relative % 85 %   Neutro Abs 17.1 (H) 1.7 - 7.7 K/uL   Lymphocytes Relative 5 %   Lymphs Abs 1.1 0.7 - 4.0 K/uL   Monocytes Relative 6 %   Monocytes Absolute 1.2 (H) 0.1 - 1.0 K/uL   Eosinophils Relative 0  %   Eosinophils Absolute 0.0 0.0 - 0.5 K/uL   Basophils Relative 0 %   Basophils Absolute 0.1 0.0 - 0.1 K/uL   WBC Morphology MORPHOLOGY UNREMARKABLE    Smear Review Normal platelet morphology    Immature Granulocytes 4 %   Abs Immature Granulocytes 0.79 (H) 0.00 - 0.07 K/uL   Burr Cells PRESENT    Polychromasia PRESENT     Comment: Performed at [facility], [address] [place], [place] [address]  Basic metabolic panel     Status: Abnormal   Collection Time: 04/29/19 10:18 AM  Result Value Ref Range   Sodium 136 135 - 145 mmol/L   Potassium 4.3 3.5 - 5.1 mmol/L   Chloride 106 98 - 111 mmol/L   CO2 20 (L) 22 - 32 mmol/L   Glucose, Bld 73 70 - 99 mg/dL   BUN 60 (H) 8 - 23 mg/dL   Creatinine, Ser 2.65 (H) 0.61 - 1.24 mg/dL   Calcium 7.7 (L) 8.9 - 10.3 mg/dL   GFR calc non Af Amer 22 (L) >60 mL/min   GFR calc Af Amer 26 (L) >60 mL/min   Anion gap 10 5 - 15    Comment: Performed at [facility], [address] [place], [place] [address]  Glucose, capillary     Status: Abnormal   Collection Time: 04/29/19 11:00 AM  Result Value Ref Range   Glucose-Capillary 104 (H) 70 - 99 mg/dL  Glucose, capillary     Status: None   Collection Time: 04/29/19 12:21 PM  Result Value Ref Range   Glucose-Capillary 97 70 - 99 mg/dL  Glucose, capillary     Status: Abnormal   Collection Time: 04/29/19  2:41 PM  Result Value Ref Range   Glucose-Capillary 113 (H) 70 - 99 mg/dL  Glucose, capillary     Status: Abnormal   Collection Time: 04/29/19  4:39 PM  Result Value Ref Range   Glucose-Capillary 138 (H) 70 - 99 mg/dL   Dg Chest Portable 1 View  Result Date: 04/29/2019 CLINICAL DATA:  Altered mental status EXAM: PORTABLE CHEST 1 VIEW COMPARISON:  04/20/2019 FINDINGS: There is bilateral chronic interstitial lung disease. There is no focal consolidation. There is no pleural effusion or pneumothorax. The heart and mediastinal contours are unremarkable. There is evidence of prior  CABG. There is no acute osseous abnormality. IMPRESSION: No active disease. Electronically Signed   By: Kathreen Devoid   On: 04/29/2019 12:09    Pending Labs Unresulted Labs (From admission, onward)    Start     Ordered   04/30/19 0500  Basic metabolic panel  Tomorrow morning,   STAT     04/29/19 1606   04/29/19 1201  Blood culture (routine x 2)  BLOOD CULTURE X 2,   STAT     04/29/19 1201   04/29/19 1020  Novel Coronavirus, NAA (Hosp order, Send-out to Ref Lab; TAT 18-24 hrs  (Asymptomatic/Tier 3)  Once,   STAT    Question Answer Comment  Is this test for diagnosis or screening Screening   Symptomatic for COVID-19 as defined by CDC Unknown   Hospitalized for COVID-19 Unknown   Admitted to ICU for COVID-19 Unknown   Previously tested for COVID-19 Unknown   Resident in a congregate (group) care setting Unknown   Employed in healthcare setting Unknown      04/29/19 1020   Signed and Held  Cortisol  Add-on,   R     Signed and Held   Visual merchandiser and Occupational hygienist morning,   R     Signed and Held   Signed and Held  CBC  Tomorrow morning,   R     Signed and Held   Signed and Held  CBC  (heparin)  Once,   R    Comments: Baseline for heparin therapy IF NOT ALREADY DRAWN.  Notify MD if PLT < 100 K.    Signed and Held   Signed and Held  Creatinine, serum  (heparin)  Once,   R    Comments: Baseline for heparin therapy IF NOT ALREADY DRAWN.    Signed and Held          Vitals/Pain Today's Vitals   04/29/19 1442 04/29/19 1500 04/29/19 1530 04/29/19 1646  BP: (!) 92/48 104/67 96/62 (!) 98/59  Pulse: 82 81 79 77  Resp: (!) 26 (!) 30 17 (!) 21  Temp:      TempSrc:      SpO2: 100% 100% 100% 100%  Weight:      Height:      PainSc:    0-No pain    Isolation Precautions No active isolations  Medications Medications  dextrose 5 % solution (has no administration in time range)  vancomycin (VANCOCIN) IVPB 1000 mg/200 mL premix (has no administration in time range)   ceFEPIme (MAXIPIME) 2 g in sodium chloride 0.9 % 100 mL IVPB (has no administration in time range)  dextrose 50 % solution 25 g (25 g Intravenous Given 04/29/19 1027)  dextrose 5 % solution ( Intravenous New Bag/Given 04/29/19 1054)  vancomycin (VANCOCIN) IVPB 1000 mg/200 mL premix (0 mg Intravenous Stopped 04/29/19 1457)  piperacillin-tazobactam (ZOSYN) IVPB 3.375 g (0 g Intravenous Stopped 04/29/19 1353)  sodium chloride 0.9 % bolus 1,000 mL (1,000 mLs Intravenous Bolus 04/29/19 1204)    Mobility walks with device     Focused Assessments See assessment by prior  RN   R Recommendations: See Admitting Provider Note  Report given to:   Additional Notes:

## 2019-04-29 NOTE — ED Notes (Signed)
Admitting at bedside 

## 2019-04-29 NOTE — ED Notes (Signed)
Pt given soft diet tray.

## 2019-04-29 NOTE — ED Notes (Signed)
Soft tray ordered due to pt not having his teeth in. Dietary called about getting a tray.

## 2019-04-29 NOTE — ED Provider Notes (Signed)
[facility] Emergency Department Provider Note  ____________________________________________   I have reviewed the triage vital signs and the nursing notes.   HISTORY  Chief Complaint Hypoglycemia   History limited by: Not limited   HPI Bryan Brewer is a 79 y.o. male who presents to the emergency department today because of concerns for difficulty with arousal this morning and low blood sugar.  Patient had similar episode yesterday and I personally evaluated him in the emergency department.  It appears that the patient had a similar episode this morning asked what happened yesterday.  Wife was having a hard time waking up.  He was noted to be hypoglycemic by EMS and had some garbled speech.  Patient states he did not eat anything this morning or last night.  Patient denies any pain.  Records reviewed. Per medical record review patient has a history of ER visit yesterday for hypoglycemia.   Past Medical History:  Diagnosis Date  . Bladder cancer ([place])   . COPD (chronic obstructive pulmonary disease) ([place])   . Hyperlipidemia   . Hypertension   . Kidney disease   . Type 2 diabetes mellitus [facility])     Patient Active Problem List   Diagnosis Date Noted  . Sinus tachycardia   . GIB (gastrointestinal bleeding) 04/20/2019  . Pressure injury of skin 03/31/2019  . HCAP (healthcare-associated pneumonia) 03/29/2019  . UTI (urinary tract infection) 03/29/2019  . Severe sepsis with septic shock ([place]) 03/29/2019  . Hyponatremia 03/29/2019  . Acute on chronic renal failure ([place]) 02/14/2019  . NSTEMI (non-ST elevated myocardial infarction) ([place]) 10/30/2017  . Diabetes ([place]) 10/30/2017  . CKD (chronic kidney disease), stage IV ([place]) 10/30/2017  . HTN (hypertension) 10/30/2017  . HLD (hyperlipidemia) 10/30/2017  . COPD (chronic obstructive pulmonary disease) ([place]) 10/30/2017    Past Surgical History:  Procedure Laterality Date  . COLONOSCOPY    .  ESOPHAGOGASTRODUODENOSCOPY N/A 04/23/2019   Procedure: ESOPHAGOGASTRODUODENOSCOPY (EGD);  Surgeon: Jonathon Bellows, MD;  Location: [facility] ENDOSCOPY;  Service: Gastroenterology;  Laterality: N/A;  . ESOPHAGOGASTRODUODENOSCOPY (EGD) WITH PROPOFOL N/A 04/21/2019   Procedure: ESOPHAGOGASTRODUODENOSCOPY (EGD) WITH PROPOFOL;  Surgeon: Jonathon Bellows, MD;  Location: [facility] ENDOSCOPY;  Service: Gastroenterology;  Laterality: N/A;  . LEFT HEART CATH AND CORONARY ANGIOGRAPHY N/A 10/31/2017   Procedure: LEFT HEART CATH AND CORONARY ANGIOGRAPHY;  Surgeon: Yolonda Kida, MD;  Location: [place] CV LAB;  Service: Cardiovascular;  Laterality: N/A;  . REVISION UROSTOMY CUTANEOUS      Prior to Admission medications   Medication Sig Start Date End Date Taking? Authorizing Provider  aspirin EC 81 MG tablet Take 81 mg by mouth every evening.    Yes [provider]  atorvastatin (LIPITOR) 80 MG tablet Take 80 mg by mouth at bedtime.   Yes [provider]  furosemide (LASIX) 40 MG tablet Take 40 mg by mouth daily.   Yes [provider]  glimepiride (AMARYL) 2 MG tablet Take 2 mg by mouth daily with breakfast.   Yes [provider]  metoprolol tartrate (LOPRESSOR) 50 MG tablet Take 1 tablet (50 mg total) by mouth 2 (two) times daily. 04/25/19  Yes Dustin Flock, MD  mometasone-formoterol [facility]) 200-5 MCG/ACT AERO Inhale 2 puffs into the lungs 2 (two) times daily. 04/04/19  Yes Gladstone Lighter, MD  pantoprazole (PROTONIX) 40 MG tablet Take 1 tablet (40 mg total) by mouth daily. 04/25/19 04/24/20 Yes Dustin Flock, MD  sodium bicarbonate 650 MG tablet Take 1,300 mg by mouth 3 (three)  times daily.   Yes [provider]  blood glucose meter kit and supplies KIT Dispense based on patient and insurance preference. Use up to four times daily as directed. (FOR ICD-9 250.00, 250.01). 04/04/19   Gladstone Lighter, MD  guaiFENesin (MUCINEX) 600 MG 12 hr tablet Take 1 tablet (600 mg  total) by mouth 2 (two) times daily. Patient not taking: Reported on 04/29/2019 04/04/19   Gladstone Lighter, MD  ipratropium-albuterol (DUONEB) 0.5-2.5 (3) MG/3ML SOLN Take 3 mLs by nebulization every 6 (six) hours as needed (wheezing, shortness of breath). 04/04/19   Gladstone Lighter, MD    Allergies Patient has no known allergies.  Family History  Problem Relation Age of Onset  . CAD Mother   . Stroke Mother   . CAD Father   . Stroke Father   . Breast cancer Sister     Social History Social History   Tobacco Use  . Smoking status: Former Smoker    Packs/day: 1.50    Years: 35.00    Pack years: 52.50    Types: Cigarettes    Quit date: 2013    Years since quitting: 7.7  . Smokeless tobacco: Never Used  Substance Use Topics  . Alcohol use: No    Frequency: Never  . Drug use: Never    Review of Systems Constitutional: No fever/chills Eyes: No visual changes. ENT: No sore throat. Cardiovascular: Denies chest pain. Respiratory: Denies shortness of breath. Gastrointestinal: No abdominal pain.  No nausea, no vomiting.  No diarrhea.   Genitourinary: Negative for dysuria. Musculoskeletal: Negative for back pain. Skin: Negative for rash. Neurological: Positive for garbled speech. ____________________________________________   PHYSICAL EXAM:  VITAL SIGNS: ED Triage Vitals  Enc Vitals Group     BP 04/29/19 1029 91/61     Pulse Rate 04/29/19 1029 78     Resp 04/29/19 1029 18     Temp --      Temp src --      SpO2 04/29/19 1029 99 %     Weight 04/29/19 1030 154 lb 5.2 oz (70 kg)     Height 04/29/19 1030 _0  (1.702 m)     Head Circumference --      Peak Flow --      Pain Score 04/29/19 1030 0   Constitutional: Awake alert and oriented. Some garbled speech. Eyes: Conjunctivae are normal.  ENT      Head: Normocephalic and atraumatic.      Nose: No congestion/rhinnorhea.      Mouth/Throat: Mucous membranes are moist.      Neck: No  stridor. Hematological/Lymphatic/Immunilogical: No cervical lymphadenopathy. Cardiovascular: Normal rate, regular rhythm.  No murmurs, rubs, or gallops.  Respiratory: Normal respiratory effort without tachypnea nor retractions. Breath sounds are clear and equal bilaterally. No wheezes/rales/rhonchi. Gastrointestinal: Soft and non tender. No rebound. No guarding.  Genitourinary: Deferred Musculoskeletal: Normal range of motion in all extremities. No lower extremity edema. Neurologic:  Garbled speech. Following commands. Moves all extremities.  Skin:  Skin is warm, dry and intact. No rash noted. Psychiatric: Mood and affect are normal. Speech and behavior are normal. Patient exhibits appropriate insight and judgment.  ____________________________________________    LABS (pertinent positives/negatives)  CBC wbc 20.3, hgb 10.5, plt 218 BMP na 136, k 4.3, co2 20, cr 2.65 Glu 39 ____________________________________________   EKG  I, Nance Pear, attending physician, personally viewed and interpreted this EKG  EKG Time: 1025 Rate: 83 Rhythm: atrial fibrillation Axis: normal Intervals: qtc 519 QRS: narrow ST  changes: no st elevation Impression: abnormal ekg   ____________________________________________    RADIOLOGY  CXR No acute abnormality  ____________________________________________   PROCEDURES  Procedures  CRITICAL CARE Performed by: Nance Pear   Total critical care time: 35 minutes  Critical care time was exclusive of separately billable procedures and treating other patients.  Critical care was necessary to treat or prevent imminent or life-threatening deterioration.  Critical care was time spent personally by me on the following activities: development of treatment plan with patient and/or surrogate as well as nursing, discussions with consultants, evaluation of patient's response to treatment, examination of patient, obtaining history from  patient or surrogate, ordering and performing treatments and interventions, ordering and review of laboratory studies, ordering and review of radiographic studies, pulse oximetry and re-evaluation of patient's condition.  ____________________________________________   INITIAL IMPRESSION / ASSESSMENT AND PLAN / ED COURSE  Pertinent labs & imaging results that were available during my care of the patient were reviewed by me and considered in my medical decision making (see chart for details).   Patient presented to the emergency department today with again concerns for altered mental status and hypoglycemia.  Patient was seen in the emergency department yesterday for similar symptoms.  Sugars were down to 39 today.  Because of this patient was given D50 and D5 infusion.  He did clinically respond well to the sugar and became much more alert.  Patient's white blood cell count is more elevated today than yesterday.  Hemoglobin has improved.  Patient was also more hypotensive today than yesterday.  The hypotension and elevated white count did raise concerns for infection.  Patient was started on broad-spectrum antibiotics.  Discussed findings with patient.  Will plan on admission. ____________________________________________   FINAL CLINICAL IMPRESSION(S) / ED DIAGNOSES  Final diagnoses:  Hypoglycemia     Note: This dictation was prepared with Dragon dictation. Any transcriptional errors that result from this process are unintentional     Nance Pear, MD 04/29/19 1253

## 2019-04-29 NOTE — ED Notes (Signed)
Pt stuck x3 by EMS then x3 by RNs. One Blood culture set achieved. MD notified.

## 2019-04-29 NOTE — ED Notes (Signed)
Pt had BM in brief. Pt cleaned and new brief applied.

## 2019-04-29 NOTE — ED Triage Notes (Signed)
Pt ems from home for AMS. Per ems wife unable to wake pt. Ems found cbg low and gave glucagon. Repeat cbg was 61. Here cbg 41 and 39. 1 amp d50 given.

## 2019-04-29 NOTE — Consult Note (Signed)
NAME: Bryan Brewer  DOB: 11-Sep-1940  MRN: [id number]  Date/Time: 04/29/2019 7:48 PM  REQUESTING PROVIDER: Dr. Anselm Jungling Subjective:  REASON FOR CONSULT: hypoglycemia r/o sepsis ? Bryan Brewer is a 78 y.o.male  with a history of COPD, type 2 diabetes, CKD, bladder cancer status post bladder resection and urostomy and ileal conduit,, CAD status post CABG at [facility] in 2019, chronic back pain with lumbar radiculopathy, also left leg medial aspect Presents to the ED by EMS on 04/28/2019 with altered mental status and garbled speech on awakening by spouse.  Patient's initial blood glucose in the emergency department was 29 and he received D50 with improvement both in clinical picture and blood glucose.  He was started on a D5 infusion. .  Vitals showed a blood pressure of 94/47, HR of 104, respiratory rate of 16, temp of 97.4 and pulse ox of 98%. Labs revealed a sodium 134, potassium 3.9, glucose 147, BUN 61, creatinine 2.51.  WBC was 17.9, hemoglobin 8.6, platelet 166. UA was clear.  He was discharged home once the blood glucose was stable. He returned today again with altered mental status.  His wife was unable to wake him up.  EMS found blood glucose to be low and gave him glucagon.  Repeat blood glucose was 61.  In the ED his blood glucose was 41 and then 39 and 1 amp of D50 was given.  Vitals in the ED was blood pressure of 91/61, pulse rate of 78, respiratory rate of 18 and pulse ox of 99%.  Labs revealed a WBC of 20, hemoglobin of 10.5, platelet of 218, creatinine of 2.65, BUN of 60    Patient has had multiple hospitalizations in the past 4 months.  His recent admission was on 04/20/2019 04/20/2019 -04/25/19 was admitted with bloody stool  and 1 episode of bloody emesis. 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  He was given esmolol 1 dose.  Seen by cardiology.  He then had a repeat EGD and clipping of  the Bryan Brewer for lesion. Hemoglobin was 5.5 on admission and received total of 3 units of blood transfusion.  On discharge hemoglobin was 10.6.   03/29/2019- 04/04/2019 for sepsis with shock secondary to UTI.  Urine cultures had Klebsiella.  Urology thought it was colonization since he has an ileal conduit.  Blood cultures were negative. Also had COPD exacerbation. Renal ultrasound then showed bilateral hydronephrosis and lateral renal cyst.  7/11- 7/13 dizziness and acute on chronic renal failure.  Pt says he took diabetic pill last night ( Glimeparide) He denies cough, chest pain, nausea, vomiting Has some sob, diarrhea Has chronic abdominal apin Past Medical History:  Diagnosis Date  . Bladder cancer ([place])   . COPD (chronic obstructive pulmonary disease) ([place])   . Hyperlipidemia   . Hypertension   . Kidney disease   . Type 2 diabetes mellitus ([place])     Past Surgical History:  Procedure Laterality Date  . COLONOSCOPY    . ESOPHAGOGASTRODUODENOSCOPY N/A 04/23/2019   Procedure: ESOPHAGOGASTRODUODENOSCOPY (EGD);  Surgeon: Jonathon Bellows, MD;  Location: [facility] ENDOSCOPY;  Service: Gastroenterology;  Laterality: N/A;  . ESOPHAGOGASTRODUODENOSCOPY (EGD) WITH PROPOFOL N/A 04/21/2019   Procedure: ESOPHAGOGASTRODUODENOSCOPY (EGD) WITH PROPOFOL;  Surgeon: Jonathon Bellows, MD;  Location[facility] END[facility]COPY;  Service: Gastroenterology;  Laterality: N/A;  . LEFT HEART CATH AND CORONARY ANGIOGRAPHY N/A 10/31/2017   Procedure: LEFT HEART CATH  AND CORONARY ANGIOGRAPHY;  Surgeon: Yolonda Kida, MD;  Location: [place] CV LAB;  Service: Cardiovascular;  Laterality: N/A;  . REVISION UROSTOMY CUTANEOUS      Social History   Socioeconomic History  . Marital status: Married    Spouse name: Not on file  . Number of children: Not on file  . Years of education: Not on file  . Highest education level: Not on file  Occupational History  . Not on file  Social Needs  . Financial resource strain: Not hard at  all  . Food insecurity    Worry: Patient refused    Inability: Patient refused  . Transportation needs    Medical: Patient refused    Non-medical: Patient refused  Tobacco Use  . Smoking status: Former Smoker    Packs/day: 1.50    Years: 35.00    Pack years: 52.50    Types: Cigarettes    Quit date: 2013    Years since quitting: 7.7  . Smokeless tobacco: Never Used  Substance and Sexual Activity  . Alcohol use: No    Frequency: Never  . Drug use: Never  . Sexual activity: Not Currently  Lifestyle  . Physical activity    Days per week: Patient refused    Minutes per session: Patient refused  . Stress: Patient refused  Relationships  . Social Herbalist on phone: Patient refused    Gets together: Patient refused    Attends religious service: Patient refused    Active member of club or organization: Patient refused    Attends meetings of clubs or organizations: Patient refused    Relationship status: Patient refused  . Intimate partner violence    Fear of current or ex partner: Patient refused    Emotionally abused: Patient refused    Physically abused: Patient refused    Forced sexual activity: Patient refused  Other Topics Concern  . Not on file  Social History Narrative  . Not on file    Family History  Problem Relation Age of Onset  . CAD Mother   . Stroke Mother   . CAD Father   . Stroke Father   . Breast cancer Sister    No Known Allergies  ? Current Facility-Administered Medications  Medication Dose Route Frequency Provider Last Rate Last Dose  . aspirin EC tablet 81 mg  81 mg Oral QPM Vaughan Basta, MD   81 mg at 04/29/19 1851  . atorvastatin (LIPITOR) tablet 80 mg  80 mg Oral QHS Vaughan Basta, MD      . ceFEPIme (MAXIPIME) 2 g in sodium chloride 0.9 % 100 mL IVPB  2 g Intravenous Q24H Nazari, Walid A, RPH 200 mL/hr at 04/29/19 1853 2 g at 04/29/19 1853  . dextrose 5 % solution   Intravenous Continuous Vaughan Basta, MD      . docusate sodium (COLACE) capsule 100 mg  100 mg Oral BID PRN Vaughan Basta, MD      . heparin injection 5,000 Units  5,000 Units Subcutaneous Q8H Vaughan Basta, MD      . ipratropium-albuterol (DUONEB) 0.5-2.5 (3) MG/3ML nebulizer solution 3 mL  3 mL Nebulization Q6H PRN Vaughan Basta, MD      . mometasone-formoterol (DULERA) 200-5 MCG/ACT inhaler 2 puff  2 puff Inhalation BID Vaughan Basta, MD      . pantoprazole (PROTONIX) EC tablet 40 mg  40 mg Oral Daily Vaughan Basta, MD   40 mg at 04/29/19 1851  . [  START ON 05/01/2019] vancomycin (VANCOCIN) IVPB 1000 mg/200 mL premix  1,000 mg Intravenous Q48H Nazari, Walid A, RPH         Abtx:  Anti-infectives (From admission, onward)   Start     Dose/Rate Route Frequency Ordered Stop   05/01/19 0400  vancomycin (VANCOCIN) IVPB 1000 mg/200 mL premix     1,000 mg 200 mL/hr over 60 Minutes Intravenous Every 48 hours 04/29/19 1552     04/29/19 1630  ceFEPIme (MAXIPIME) 2 g in sodium chloride 0.9 % 100 mL IVPB     2 g 200 mL/hr over 30 Minutes Intravenous Every 24 hours 04/29/19 1552     04/29/19 1215  vancomycin (VANCOCIN) IVPB 1000 mg/200 mL premix     1,000 mg 200 mL/hr over 60 Minutes Intravenous  Once 04/29/19 1201 04/29/19 1457   04/29/19 1215  piperacillin-tazobactam (ZOSYN) IVPB 3.375 g     3.375 g 100 mL/hr over 30 Minutes Intravenous  Once 04/29/19 1201 04/29/19 1353      REVIEW OF SYSTEMS:  Const: negative fever, negative chills, negative weight loss Eyes: negative diplopia or visual changes, negative eye pain ENT: negative coryza, negative sore throat Resp: negative cough, hemoptysis, some dyspnea Cards: negative for chest pain, palpitations, lower extremity edema GU: negative for frequency, dysuria and hematuria GI: chronic abdominal pain,has  diarrhea, no bleeding, constipation Skin: easy bruising Heme:easy bruising  MS: negative for myalgias, arthralgias, back  pain and muscle weakness Neurolo:altered mental status and memory issues before his glucose was corrected Psych: negative for feelings of anxiety, depression  Endocrine: has diabtes Allergy/Immunology- negative for any medication or food allergies ?  Objective:  VITALS:  BP (!) 109/58 (BP Location: Right Arm)   Pulse 84   Temp 97.6 F (36.4 C) (Oral)   Resp 20   Ht 5\' 7"  (1.702 m)   Wt 70 kg   SpO2 100%   BMI 24.17 kg/m  PHYSICAL EXAM:  General: Alert, cooperative, no distress, appears stated age. Oriented in place, person, time, responds to questions appropriately Head: Normocephalic, without obvious abnormality, atraumatic. Eyes: Conjunctivae clear, anicteric sclerae. Pupils are equal ENT Nares normal. No drainage or sinus tenderness. Lips, mucosa, and tongue normal. No Thrush Poor denition- broken 2 teeth remaining lower Neck: Supple, symmetrical, no adenopathy, thyroid: non tender no carotid bruit and no JVD. Back: No CVA tenderness. Lungs: Clear to auscultation bilaterally. No Wheezing or Rhonchi. No rales. Heart: s1s2 Sternal scar. Abdomen: Soft,hernia next to the conduit. Bowel sounds normal. No masses Extremities: upper extremities paper thin skin, bruising Legs venous stasis and healed venous ulcer left leg Skin:bruising Lymph: Cervical, supraclavicular normal. Neurologic: Grossly non-focal Pertinent Labs Lab Results CBC     Component Value Date/Time   WBC 20.3 (H) 04/29/2019 1018   RBC 3.64 (L) 04/29/2019 1018   HGB 10.5 (L) 04/29/2019 1018   HGB 14.8 04/21/2012 1239   HCT 32.6 (L) 04/29/2019 1018   HCT 45.4 04/21/2012 1239   PLT 218 04/29/2019 1018   PLT 327 04/21/2012 1239   MCV 89.6 04/29/2019 1018   MCV 88 04/21/2012 1239   MCH 28.8 04/29/2019 1018   MCHC 32.2 04/29/2019 1018   RDW 17.0 (H) 04/29/2019 1018   RDW 14.9 (H) 04/21/2012 1239   LYMPHSABS 1.1 04/29/2019 1018   LYMPHSABS 1.1 04/21/2012 1239   MONOABS 1.2 (H) 04/29/2019 1018   MONOABS  0.7 04/21/2012 1239   EOSABS 0.0 04/29/2019 1018   EOSABS 0.1 04/21/2012 1239   BASOSABS 0.1 04/29/2019  1018   BASOSABS 0.1 04/21/2012 1239    CMP Latest Ref Rng & Units 04/29/2019 04/28/2019 04/22/2019  Glucose 70 - 99 mg/dL 73 147(H) 101(H)  BUN 8 - 23 mg/dL 60(H) 61(H) 84(H)  Creatinine 0.61 - 1.24 mg/dL 2.65(H) 2.51(H) 2.35(H)  Sodium 135 - 145 mmol/L 136 134(L) 148(H)  Potassium 3.5 - 5.1 mmol/L 4.3 3.9 3.7  Chloride 98 - 111 mmol/L 106 107 118(H)  CO2 22 - 32 mmol/L 20(L) 16(L) 21(L)  Calcium 8.9 - 10.3 mg/dL 7.7(L) 7.2(L) 7.9(L)  Total Protein 6.5 - 8.1 g/dL - - -  Total Bilirubin 0.3 - 1.2 mg/dL - - -  Alkaline Phos 38 - 126 U/L - - -  AST 15 - 41 U/L - - -  ALT 0 - 44 U/L - - -      Microbiology: Recent Results (from the past 240 hour(s))  SARS Coronavirus 2 [facility] order, Performed in [facility] hospital lab) Nasopharyngeal Nasopharyngeal Swab     Status: None   Collection Time: 04/20/19  2:32 PM   Specimen: Nasopharyngeal Swab  Result Value Ref Range Status   SARS Coronavirus 2 NEGATIVE NEGATIVE Final    Comment: (NOTE) If result is NEGATIVE SARS-CoV-2 target nucleic acids are NOT DETECTED. The SARS-CoV-2 RNA is generally detectable in upper and lower  respiratory specimens during the acute phase of infection. The lowest  concentration of SARS-CoV-2 viral copies this assay can detect is 250  copies / mL. A negative result does not preclude SARS-CoV-2 infection  and should not be used as the sole basis for treatment or other  patient management decisions.  A negative result may occur with  improper specimen collection / handling, submission of specimen other  than nasopharyngeal swab, presence of viral mutation(s) within the  areas targeted by this assay, and inadequate number of viral copies  (<250 copies / mL). A negative result must be combined with clinical  observations, patient history, and epidemiological information. If result is POSITIVE SARS-CoV-2  target nucleic acids are DETECTED. The SARS-CoV-2 RNA is generally detectable in upper and lower  respiratory specimens dur ing the acute phase of infection.  Positive  results are indicative of active infection with SARS-CoV-2.  Clinical  correlation with patient history and other diagnostic information is  necessary to determine patient infection status.  Positive results do  not rule out bacterial infection or co-infection with other viruses. If result is PRESUMPTIVE POSTIVE SARS-CoV-2 nucleic acids MAY BE PRESENT.   A presumptive positive result was obtained on the submitted specimen  and confirmed on repeat testing.  While 2019 novel coronavirus  (SARS-CoV-2) nucleic acids may be present in the submitted sample  additional confirmatory testing may be necessary for epidemiological  and / or clinical management purposes  to differentiate between  SARS-CoV-2 and other Sarbecovirus currently known to infect humans.  If clinically indicated additional testing with an alternate test  methodology [phone]) is advised. The SARS-CoV-2 RNA is generally  detectable in upper and lower respiratory sp ecimens during the acute  phase of infection. The expected result is Negative. Fact Sheet for Patients:  StrictlyIdeas.no Fact Sheet for Healthcare Providers: BankingDealers.co.za This test is not yet approved or cleared by the [place] FDA and has been authorized for detection and/or diagnosis of SARS-CoV-2 by FDA under an Emergency Use Authorization (EUA).  This EUA will remain in effect (meaning this test can be used) for the duration of the COVID-19 declaration under Section 564(b)(1) of the Act, 21  U.S.C. section 360bbb-3(b)(1), unless the authorization is terminated or revoked sooner. Performed at [facility], [address] [place], [place] [address]     IMAGING RESULTS:   I have personally reviewed the films ?  Impression/Recommendation ?78 y.o.male  with a history of COPD, type 2 diabetes, CKD, bladder cancer status post bladder resection and urostomy and ileal conduit,, CAD status post CABG at [facility] in 2019, chronic back pain with lumbar radiculopathy, also left leg medial aspect Presents to the ED by EMS on 04/28/2019 with altered mental status and garbled speech on awakening by spouse. Found to be hypoglycemia   Encephalopathy due to Hypoglycemia very likely due to sulfonyl urea. Because of hypotension sepsis is questioned but clinically no focus of infection Cortisol pending  Leucocytosis- no clear evidence of infection clinically- could be stress response , but it has been increasing  HE has ileal conduit and b/l hydronehrosis- but no evidence of UTI Has diarrhea but not significant - stool studies pending Currently on vanco and cefepime- will DC tomorrow if blood culture NG  DM -was on glimeparide at home- should not take it anymore  AKI on CKD  Recent GI bleed-  ? Ca bladder s/p resection with ileostomy  CAD s/p CABG  Bruising of skin could be from steroid inhaler  COPD ___________________________________________________ Discussed with patient Note:  This document was prepared using Dragon voice recognition software and may include unintentional dictation errors.

## 2019-04-30 DIAGNOSIS — E162 Hypoglycemia, unspecified: Secondary | ICD-10-CM

## 2019-04-30 LAB — BASIC METABOLIC PANEL
Anion gap: 7 (ref 5–15)
BUN: 55 mg/dL — ABNORMAL HIGH (ref 8–23)
CO2: 17 mmol/L — ABNORMAL LOW (ref 22–32)
Calcium: 7.1 mg/dL — ABNORMAL LOW (ref 8.9–10.3)
Chloride: 110 mmol/L (ref 98–111)
Creatinine, Ser: 2.34 mg/dL — ABNORMAL HIGH (ref 0.61–1.24)
GFR calc Af Amer: 30 mL/min — ABNORMAL LOW (ref >60)
GFR calc non Af Amer: 26 mL/min — ABNORMAL LOW (ref >60)
Glucose, Bld: 156 mg/dL — ABNORMAL HIGH (ref 70–99)
Potassium: 3.2 mmol/L — ABNORMAL LOW (ref 3.5–5.1)
Sodium: 134 mmol/L — ABNORMAL LOW (ref 135–145)

## 2019-04-30 LAB — CBC
HCT: 26.5 % — ABNORMAL LOW (ref 39.0–52.0)
Hemoglobin: 8.5 g/dL — ABNORMAL LOW (ref 13.0–17.0)
MCH: 28.5 pg (ref 26.0–34.0)
MCHC: 32.1 g/dL (ref 30.0–36.0)
MCV: 88.9 fL (ref 80.0–100.0)
Platelets: 162 10*3/uL (ref 150–400)
RBC: 2.98 MIL/uL — ABNORMAL LOW (ref 4.22–5.81)
RDW: 16.7 % — ABNORMAL HIGH (ref 11.5–15.5)
WBC: 9.9 10*3/uL (ref 4.0–10.5)
nRBC: 0 % (ref 0.0–0.2)

## 2019-04-30 LAB — GLUCOSE, CAPILLARY
Glucose-Capillary: 188 mg/dL — ABNORMAL HIGH (ref 70–99)
Glucose-Capillary: 151 mg/dL — ABNORMAL HIGH (ref 70–99)
Glucose-Capillary: 143 mg/dL — ABNORMAL HIGH (ref 70–99)
Glucose-Capillary: 111 mg/dL — ABNORMAL HIGH (ref 70–99)
Glucose-Capillary: 276 mg/dL — ABNORMAL HIGH (ref 70–99)
Glucose-Capillary: 267 mg/dL — ABNORMAL HIGH (ref 70–99)
Glucose-Capillary: 324 mg/dL — ABNORMAL HIGH (ref 70–99)

## 2019-04-30 LAB — CORTISOL: Cortisol, Plasma: 14.1 ug/dL

## 2019-04-30 LAB — NOVEL CORONAVIRUS, NAA (HOSP ORDER, SEND-OUT TO REF LAB; TAT 18-24 HRS): SARS-CoV-2, NAA: NOT DETECTED

## 2019-04-30 LAB — MRSA PCR SCREENING: MRSA by PCR: NEGATIVE

## 2019-04-30 MED ORDER — INSULIN ASPART 100 UNIT/ML ~~LOC~~ SOLN
0.0000 [IU] | Freq: Three times a day (TID) | SUBCUTANEOUS | Status: DC
  Administered 2019-05-01: 12:00:00 5 [IU] via SUBCUTANEOUS
  Filled 2019-04-30: qty 1

## 2019-04-30 MED ORDER — VANCOMYCIN 50 MG/ML ORAL SOLUTION
125.0000 mg | Freq: Four times a day (QID) | ORAL | Status: DC
  Administered 2019-04-30 – 2019-05-01 (×5): 125 mg via ORAL
  Filled 2019-04-30 (×7): qty 2.5

## 2019-04-30 MED ORDER — INSULIN ASPART 100 UNIT/ML ~~LOC~~ SOLN
0.0000 [IU] | Freq: Every day | SUBCUTANEOUS | Status: DC
  Administered 2019-04-30: 22:00:00 4 [IU] via SUBCUTANEOUS
  Filled 2019-04-30: qty 1

## 2019-04-30 MED ORDER — POTASSIUM CHLORIDE CRYS ER 20 MEQ PO TBCR
40.0000 meq | EXTENDED_RELEASE_TABLET | Freq: Once | ORAL | Status: AC
  Administered 2019-04-30: 40 meq via ORAL
  Filled 2019-04-30: qty 2

## 2019-04-30 MED ORDER — INSULIN ASPART 100 UNIT/ML ~~LOC~~ SOLN
5.0000 [IU] | SUBCUTANEOUS | Status: AC
  Administered 2019-04-30: 5 [IU] via SUBCUTANEOUS
  Filled 2019-04-30: qty 1

## 2019-04-30 NOTE — Evaluation (Signed)
Physical Therapy Evaluation Patient Details Name: Bryan Brewer MRN: [id number] DOB: June 23, 1941 Today's Date: 04/30/2019   History of Present Illness  Bryan Brewer is a 46yoM who comes to [facility] on 04/29/19 after AMS, found to be hypoglycemic attributed to medication error. PMH: Bladder CA s/p urostomy, recent UTI sepsis, just here last weeks for ABLA and GIB.  Clinical Impression  Pt admitted with above diagnosis. Pt currently with functional limitations due to the deficits listed below (see "PT Problem List"). Upon entry, pt in bed, awake and agreeable to participate. The pt is alert and oriented x4, pleasant, conversational, and generally a good historian. Supervision for bed mobility, transfers, and AMB all without LOB, but AMB distance limited to less than 142ft d/t a/c radicular leg pain and cramping. Pt is tearful at end of session due to current state. Of note significant edema and minimal bruising in BUE worse distal, but with weeping from bilat proximal antebrachium. No SOB or chest palpitations as in prior PT eval 1WA at last admission. Functional mobility assessment demonstrates increased effort/time requirements, fair tolerance, but no need for physical assistance, whereas the patient performed these at a higher level of independence PTA. Pt will benefit from skilled PT intervention to increase independence and safety with basic mobility in preparation for discharge to the venue listed below.       Follow Up Recommendations Outpatient PT;Supervision - Intermittent;Other (comment)(typically not agreeable to [facility] services due to privacy preferences, but is agreeable to OPPT; [place] main is convenient to their home location)    Equipment Recommendations  None recommended by PT    Recommendations for Other Services       Precautions / Restrictions Precautions Precautions: Fall Precaution Comments: chronic back and leg pain related to radiculaopthy Restrictions Weight Bearing Restrictions:  No      Mobility  Bed Mobility Overal bed mobility: Modified Independent                Transfers Overall transfer level: Needs assistance Equipment used: Rolling walker (2 wheeled) Transfers: Sit to/from Stand Sit to Stand: Supervision         General transfer comment: twice in session, safe RW use, moderate effort, absolute need for hands to rise  Ambulation/Gait Ambulation/Gait assistance: Supervision;Min guard Gait Distance (Feet): 120 Feet Assistive device: Rolling walker (2 wheeled) Gait Pattern/deviations: Step-to pattern;Antalgic     General Gait Details: appears fairly stable with RW; has to stop after said distance due to worsening radicular cramping in leg  Stairs            Wheelchair Mobility    Modified Rankin (Stroke Patients Only)       Balance Overall balance assessment: Needs assistance;No apparent balance deficits (not formally assessed)                                           Pertinent Vitals/Pain Pain Assessment: Faces Faces Pain Scale: Hurts little more Pain Location: stomach pain Pain Descriptors / Indicators: Aching Pain Intervention(s): Limited activity within patient's tolerance;Monitored during session;Premedicated before session;Repositioned;Patient requesting pain meds-RN notified    Home Living Family/patient expects to be discharged to:: Private residence Living Arrangements: Spouse/significant other Available Help at Discharge: Family;Available 24 hours/day Type of Home: House Home Access: Stairs to enter Entrance Stairs-Rails: None Entrance Stairs-Number of Steps: 2 Home Layout: Able to live on main level with bedroom/bathroom Home Equipment: [name] -  single point;Walker - 2 wheels;Walker - 4 wheels      Prior Function Level of Independence: Independent         Comments: Indep without assist device for ADLs, household mobilization at baseline due to chronic lumbar neuropathy in LLE;  recent use of SPC due to weakness in recent weeks. Endorses single fall within previous six months.     Hand Dominance        Extremity/Trunk Assessment   Upper Extremity Assessment Upper Extremity Assessment: Generalized weakness;Overall [facility] for tasks assessed    Lower Extremity Assessment Lower Extremity Assessment: Generalized weakness;Overall WFL for tasks assessed       Communication   Communication: HOH  Cognition Arousal/Alertness: Awake/alert Behavior During Therapy: WFL for tasks assessed/performed Overall Cognitive Status: Within Functional Limits for tasks assessed                                        General Comments      Exercises     Assessment/Plan    PT Assessment Patient needs continued PT services  PT Problem List Decreased strength;Decreased activity tolerance;Decreased balance;Decreased mobility;Cardiopulmonary status limiting activity;Decreased safety awareness       PT Treatment Interventions DME instruction;Balance training;Stair training;Therapeutic activities;Gait training;Functional mobility training;Therapeutic exercise;Patient/family education    PT Goals (Current goals can be found in the Care Plan section)  Acute Rehab PT Goals Patient Stated Goal: Pt wants to regain strength and mobility capacity PT Goal Formulation: With patient/family Time For Goal Achievement: 05/07/19 Potential to Achieve Goals: Good    Frequency Min 2X/week   Barriers to discharge        Co-evaluation               AM-PAC PT "6 Clicks" Mobility  Outcome Measure Help needed turning from your back to your side while in a flat bed without using bedrails?: None Help needed moving from lying on your back to sitting on the side of a flat bed without using bedrails?: None Help needed moving to and from a bed to a chair (including a wheelchair)?: A Little Help needed standing up from a chair using your arms (e.g., wheelchair or bedside  chair)?: A Little Help needed to walk in hospital room?: A Little Help needed climbing 3-5 steps with a railing? : A Little 6 Click Score: 20    End of Session Equipment Utilized During Treatment: Gait belt Activity Tolerance: Patient tolerated treatment well;Patient limited by pain Patient left: with family/visitor present;in chair;with call bell/phone within reach Nurse Communication: Mobility status PT Visit Diagnosis: Muscle weakness (generalized) (M62.81);Difficulty in walking, not elsewhere classified (R26.2)    Time: 7371-0626 PT Time Calculation (min) (ACUTE ONLY): 20 min   Charges:   PT Evaluation $PT Eval Moderate Complexity: 1 Mod         11:53 AM, 04/30/19 Etta Grandchild, PT, DPT Physical Therapist - [facility]  [phone] ([place])    Ramon Brant C 04/30/2019, 11:48 AM

## 2019-04-30 NOTE — Progress Notes (Signed)
ID  Pt doing well Says diarrhea resolved   Patient Vitals for the past 24 hrs:  BP Temp Temp src Pulse Resp SpO2  04/30/19 1322 (!) 91/57 97.8 F (36.6 C) Oral 96 20 98 %  04/30/19 0530 (!) 90/53 98.8 F (37.1 C) Oral 76 16 98 %  04/29/19 2109 (!) 110/52 98.7 F (37.1 C) Oral 85 20 99 %    Awake, alert orinted X5 Chest b/l air entry HSs1s2 Abd soft- ileal conduit   CBC Latest Ref Rng & Units 04/30/2019 04/29/2019 04/28/2019  WBC 4.0 - 10.5 K/uL 9.9 20.3(H) 17.9(H)  Hemoglobin 13.0 - 17.0 g/dL 8.5(L) 10.5(L) 8.6(L)  Hematocrit 39.0 - 52.0 % 26.5(L) 32.6(L) 26.8(L)  Platelets 150 - 400 K/uL 162 218 166    CMP Latest Ref Rng & Units 04/30/2019 04/29/2019 04/28/2019  Glucose 70 - 99 mg/dL 156(H) 73 147(H)  BUN 8 - 23 mg/dL 55(H) 60(H) 61(H)  Creatinine 0.61 - 1.24 mg/dL 2.34(H) 2.65(H) 2.51(H)  Sodium 135 - 145 mmol/L 134(L) 136 134(L)  Potassium 3.5 - 5.1 mmol/L 3.2(L) 4.3 3.9  Chloride 98 - 111 mmol/L 110 106 107  CO2 22 - 32 mmol/L 17(L) 20(L) 16(L)  Calcium 8.9 - 10.3 mg/dL 7.1(L) 7.7(L) 7.2(L)  Total Protein 6.5 - 8.1 g/dL - - -  Total Bilirubin 0.3 - 1.2 mg/dL - - -  Alkaline Phos 38 - 126 U/L - - -  AST 15 - 41 U/L - - -  ALT 0 - 44 U/L - - -     BC NG   Impression/recommendation  Hypoglycemia due to sulfonyl urea Soft BP No evidence of infection- BC neg- DC antibiotics  Diarrhea- Cdiff antigen positive but toxin neg/PCR positive- carriage state- his wbc and diarrhea are much improved even before starting PO vanco.  Avoid antibiotics   CKD   Ca bladder s/p resection with ileostomy  CAD s/p CABG   Discussed the management with patient- ID will sign off- call if needed

## 2019-04-30 NOTE — Progress Notes (Signed)
[facility] at [place] NAME: Bryan Brewer    MR#:  [id number]  DATE OF BIRTH:  1940-09-13  SUBJECTIVE:   Patient presented to the hospital secondary to hypoglycemia/weakness and suspected to have sepsis.  Initially was on broad-spectrum antibiotics but now they have been discontinued.  Cultures remain negative, patient was having some diarrhea and his stool for C. difficile PCR is positive.  Still having some liquid stools.  Denies any abdominal pain fever or any other associated symptoms.  REVIEW OF SYSTEMS:    Review of Systems  Constitutional: Negative for chills and fever.  HENT: Negative for congestion and tinnitus.   Eyes: Negative for blurred vision and double vision.  Respiratory: Negative for cough, shortness of breath and wheezing.   Cardiovascular: Negative for chest pain, orthopnea and PND.  Gastrointestinal: Negative for abdominal pain, diarrhea, nausea and vomiting.  Genitourinary: Negative for dysuria and hematuria.  Neurological: Positive for weakness (generalized). Negative for dizziness, sensory change and focal weakness.  All other systems reviewed and are negative.   Nutrition: Heart Healthy Tolerating Diet: Yes Tolerating PT: Await Eval  DRUG ALLERGIES:  No Known Allergies  VITALS:  Blood pressure (!) 91/57, pulse 96, temperature 97.8 F (36.6 C), temperature source Oral, resp. rate 20, height 5\' 7"  (1.702 m), weight 70 kg, SpO2 98 %.  PHYSICAL EXAMINATION:   Physical Exam  GENERAL:  78 y.o.-year-old patient lying in bed in no acute distress.  EYES: Pupils equal, round, reactive to light and accommodation. No scleral icterus. Extraocular muscles intact.  HEENT: Head atraumatic, normocephalic. Oropharynx and nasopharynx clear.  NECK:  Supple, no jugular venous distention. No thyroid enlargement, no tenderness.  LUNGS: Normal breath sounds bilaterally, no wheezing, rales, rhonchi. No use of accessory muscles of  respiration.  CARDIOVASCULAR: S1, S2 normal. No murmurs, rubs, or gallops.  ABDOMEN: Soft, nontender, nondistended. Bowel sounds present. No organomegaly or mass.  Ileal conduit in place with yellow urine draining. EXTREMITIES: No cyanosis, clubbing, left upper extremity blistering and weeping edema, +1-2 pitting edema in the lower extremities bilaterally.Marland Kitchen    NEUROLOGIC: Cranial nerves II through XII are intact. No focal Motor or sensory deficits b/l.  Globally weak. PSYCHIATRIC: The patient is alert and oriented x 3.  SKIN: No obvious rash, lesion, or ulcer.    LABORATORY PANEL:   CBC Recent Labs  Lab 04/30/19 0441  WBC 9.9  HGB 8.5*  HCT 26.5*  PLT 162   ------------------------------------------------------------------------------------------------------------------  Chemistries  Recent Labs  Lab 04/30/19 0441  NA 134*  K 3.2*  CL 110  CO2 17*  GLUCOSE 156*  BUN 55*  CREATININE 2.34*  CALCIUM 7.1*   ------------------------------------------------------------------------------------------------------------------  Cardiac Enzymes No results for input(s): TROPONINI in the last 168 hours. ------------------------------------------------------------------------------------------------------------------  RADIOLOGY:  Dg Chest Portable 1 View  Result Date: 04/29/2019 CLINICAL DATA:  Altered mental status EXAM: PORTABLE CHEST 1 VIEW COMPARISON:  04/20/2019 FINDINGS: There is bilateral chronic interstitial lung disease. There is no focal consolidation. There is no pleural effusion or pneumothorax. The heart and mediastinal contours are unremarkable. There is evidence of prior CABG. There is no acute osseous abnormality. IMPRESSION: No active disease. Electronically Signed   By: Kathreen Devoid   On: 04/29/2019 12:09     ASSESSMENT AND PLAN:   78 year old male with past medical history of DM type II with CKD stage III, COPD, history of bladder cancer status post ileal  conduit, hypertension, hyperlipidemia who presents to the hospital  due to weakness/altered mental status and noted to be hypoglycemic.  1.  Weakness/altered mental status-secondary to the hypoglycemia. - Patient's hypoglycemia has improved with dextrose infusion mental status is back to baseline.  2.  Hypoglycemia- in the setting of CKD and use of Amaryl. - Hold oral antidiabetic meds for now.  -Hypoglycemia improved with dextrose infusion.  Patient likely will need to be off diabetic meds will use low-dose glipizide upon discharge.  Patient's last A1c was 6.5.  3.  Sepsis-suspected given patient's hypoglycemia/altered mental status and also leukocytosis. - No clear source identified.  Patient's blood cultures remain negative.  Initially was on broad-spectrum antibiotics but will discontinue dose.  Appreciate ID input.  4.  Acute diarrhea-suspected to be secondary to C. difficile.  Patient C. difficile PCR is positive, antigen is positive but toxin is negative.  Discussed with ID and will start treatment on oral vancomycin as patient is symptomatic. -Continue contact precautions.  5.  Hyperlipidemia-continue atorvastatin.    6.  GERD- continue Protonix.  7.  CKD stage III-patient's creatinine close to baseline and will continue to monitor.  8. COPD- no acute exacerbation.  Cont. Dulera, duoneb as needed.    All the records are reviewed and case discussed with Care Management/Social Worker. Management plans discussed with the patient, family and they are in agreement.  CODE STATUS: Partial code  DVT Prophylaxis: Hep SQ  TOTAL TIME TAKING CARE OF THIS PATIENT: 30 minutes.   POSSIBLE D/C IN 1-2 DAYS, DEPENDING ON CLINICAL CONDITION.   Henreitta Leber M.D on 04/30/2019 at 2:54 PM  Between 7am to 6pm - Pager - [phone]  After 6pm go to [email or web address] - Proofreader  [facility]  Office  [phone]  CC: Primary care physician; Tracie Harrier, MD

## 2019-04-30 NOTE — Telephone Encounter (Signed)
Patient discharged on Sunday 9/19. He was readmitted on 9/22.

## 2019-05-01 LAB — BASIC METABOLIC PANEL
Anion gap: 9 (ref 5–15)
BUN: 57 mg/dL — ABNORMAL HIGH (ref 8–23)
CO2: 16 mmol/L — ABNORMAL LOW (ref 22–32)
Calcium: 7.4 mg/dL — ABNORMAL LOW (ref 8.9–10.3)
Chloride: 112 mmol/L — ABNORMAL HIGH (ref 98–111)
Creatinine, Ser: 2.43 mg/dL — ABNORMAL HIGH (ref 0.61–1.24)
GFR calc Af Amer: 29 mL/min — ABNORMAL LOW (ref >60)
GFR calc non Af Amer: 25 mL/min — ABNORMAL LOW (ref >60)
Glucose, Bld: 149 mg/dL — ABNORMAL HIGH (ref 70–99)
Potassium: 4.1 mmol/L (ref 3.5–5.1)
Sodium: 137 mmol/L (ref 135–145)

## 2019-05-01 LAB — CBC
HCT: 27.8 % — ABNORMAL LOW (ref 39.0–52.0)
Hemoglobin: 8.9 g/dL — ABNORMAL LOW (ref 13.0–17.0)
MCH: 28.6 pg (ref 26.0–34.0)
MCHC: 32 g/dL (ref 30.0–36.0)
MCV: 89.4 fL (ref 80.0–100.0)
Platelets: 196 10*3/uL (ref 150–400)
RBC: 3.11 MIL/uL — ABNORMAL LOW (ref 4.22–5.81)
RDW: 16.9 % — ABNORMAL HIGH (ref 11.5–15.5)
WBC: 9.7 10*3/uL (ref 4.0–10.5)
nRBC: 0 % (ref 0.0–0.2)

## 2019-05-01 LAB — GLUCOSE, CAPILLARY
Glucose-Capillary: 97 mg/dL (ref 70–99)
Glucose-Capillary: 268 mg/dL — ABNORMAL HIGH (ref 70–99)

## 2019-05-01 MED ORDER — GLIPIZIDE 5 MG PO TABS: 2.5000 mg | ORAL_TABLET | Freq: Every day | ORAL | 1 refills | Status: DC

## 2019-05-01 MED ORDER — VANCOMYCIN HCL 125 MG PO CAPS: 125.0000 mg | ORAL_CAPSULE | Freq: Four times a day (QID) | ORAL | 0 refills | Status: AC

## 2019-05-01 NOTE — Discharge Summary (Signed)
[facility] at [place] NAME: Bryan Brewer    MR#:  [id number]  DATE OF BIRTH:  1940-09-10  DATE OF ADMISSION:  04/29/2019 ADMITTING PHYSICIAN: Vaughan Basta, MD  DATE OF DISCHARGE: 05/01/2019  PRIMARY CARE PHYSICIAN: Tracie Harrier, MD    ADMISSION DIAGNOSIS:  Hypoglycemia [E16.2]  DISCHARGE DIAGNOSIS:  Principal Problem:   Hypoglycemia Active Problems:   Hypotension   SECONDARY DIAGNOSIS:   Past Medical History:  Diagnosis Date  . Bladder cancer ([place])   . COPD (chronic obstructive pulmonary disease) ([place])   . Hyperlipidemia   . Hypertension   . Kidney disease   . Type 2 diabetes mellitus [facility])     HOSPITAL COURSE:   78 year old male with past medical history of DM type II with CKD stage III, COPD, history of bladder cancer status post ileal conduit, hypertension, hyperlipidemia who presents to the hospital due to weakness/altered mental status and noted to be hypoglycemic.  1.  Weakness/altered mental status-secondary to the hypoglycemia. -Patient was given some dextrose infusion and hypoglycemia has resolved.  Altered mental status has resolved and he is at baseline.  Weakness is also improved and he is ambulating well.  2.  Hypoglycemia- in the setting of CKD and use of Amaryl. -Patient's Amaryl was held.  Blood sugars have improved.  Patient's last A1c was 6.5.  I have switched him from oral Amaryl to low-dose glipizide for now. -Patient will continue follow-up with his PCP as an outpatient for further changes in his meds.  3.  Sepsis-suspected given patient's hypoglycemia/altered mental status and also leukocytosis. -No clear source of sepsis was identified.  Patient's cultures remain negative, he is clinically asymptomatic and afebrile.  Sepsis has been ruled out now.  Initially on broad-spectrum with vancomycin and cefepime  which have now been discontinued. -Infectious disease was consulted and they agreed  with this management.  4.  Acute diarrhea-suspected to be secondary to C. difficile.  Patient C. difficile PCR was positive, antigen is positive but toxin is negative.   -Discussed with infectious disease and started on oral vancomycin.  Diarrhea has improved.  Patient is now being discharged on oral vancomycin for additional week. -Patient is afebrile and tolerating p.o. well.  5.  Hyperlipidemia- pt. Will continue atorvastatin.    6.  GERD- continue Protonix.  7.  CKD stage III-patient's creatinine close to baseline and will continue to monitor.  8. COPD- no acute exacerbation.  Cont. Dulera, duoneb as needed.  DISCHARGE CONDITIONS:   Stable.   CONSULTS OBTAINED:    DRUG ALLERGIES:  No Known Allergies  DISCHARGE MEDICATIONS:   Allergies as of 05/01/2019   No Known Allergies     Medication List    STOP taking these medications   glimepiride 2 MG tablet Commonly known as: AMARYL   guaiFENesin 600 MG 12 hr tablet Commonly known as: MUCINEX     TAKE these medications   aspirin EC 81 MG tablet Take 81 mg by mouth every evening.   atorvastatin 80 MG tablet Commonly known as: LIPITOR Take 80 mg by mouth at bedtime.   blood glucose meter kit and supplies Kit Dispense based on patient and insurance preference. Use up to four times daily as directed. (FOR ICD-9 250.00, 250.01).   Dulera 200-5 MCG/ACT Aero Generic drug: mometasone-formoterol Inhale 2 puffs into the lungs 2 (two) times daily.   furosemide 40 MG tablet Commonly known as: LASIX Take 40 mg by mouth daily.   glipiZIDE 5  MG tablet Commonly known as: GLUCOTROL Take 0.5 tablets (2.5 mg total) by mouth daily before breakfast.   ipratropium-albuterol 0.5-2.5 (3) MG/3ML Soln Commonly known as: DUONEB Take 3 mLs by nebulization every 6 (six) hours as needed (wheezing, shortness of breath).   metoprolol tartrate 50 MG tablet Commonly known as: LOPRESSOR Take 1 tablet (50 mg total) by mouth 2 (two)  times daily.   pantoprazole 40 MG tablet Commonly known as: Protonix Take 1 tablet (40 mg total) by mouth daily.   sodium bicarbonate 650 MG tablet Take 1,300 mg by mouth 3 (three) times daily.   vancomycin 125 MG capsule Commonly known as: Vancocin HCl Take 1 capsule (125 mg total) by mouth 4 (four) times daily for 7 days.         DISCHARGE INSTRUCTIONS:   DIET:  Cardiac diet  DISCHARGE CONDITION:  Stable  ACTIVITY:  Activity as tolerated  OXYGEN:  Home Oxygen: No.   Oxygen Delivery: room air  DISCHARGE LOCATION:  home   If you experience worsening of your admission symptoms, develop shortness of breath, life threatening emergency, suicidal or homicidal thoughts you must seek medical attention immediately by calling 911 or calling your MD immediately  if symptoms less severe.  You Must read complete instructions/literature along with all the possible adverse reactions/side effects for all the Medicines you take and that have been prescribed to you. Take any new Medicines after you have completely understood and accpet all the possible adverse reactions/side effects.   Please note  You were cared for by a hospitalist during your hospital stay. If you have any questions about your discharge medications or the care you received while you were in the hospital after you are discharged, you can call the unit and asked to speak with the hospitalist on call if the hospitalist that took care of you is not available. Once you are discharged, your primary care physician will handle any further medical issues. Please note that NO REFILLS for any discharge medications will be authorized once you are discharged, as it is imperative that you return to your primary care physician (or establish a relationship with a primary care physician if you do not have one) for your aftercare needs so that they can reassess your need for medications and monitor your lab values.     Today    Hypoglycemia resolved, Afebrile, Diarrhea has resolved.  No complaints presently. Will d/c home today.    VITAL SIGNS:  Blood pressure 116/60, pulse 92, temperature 97.7 F (36.5 C), temperature source Oral, resp. rate 18, height _0  (1.702 m), weight 70 kg, SpO2 100 %.  I/O:    Intake/Output Summary (Last 24 hours) at 05/01/2019 1423 Last data filed at 05/01/2019 1038 Gross per 24 hour  Intake 480 ml  Output 600 ml  Net -120 ml    PHYSICAL EXAMINATION:   GENERAL:  78 y.o.-year-old patient lying in bed in no acute distress.  EYES: Pupils equal, round, reactive to light and accommodation. No scleral icterus. Extraocular muscles intact.  HEENT: Head atraumatic, normocephalic. Oropharynx and nasopharynx clear.  NECK:  Supple, no jugular venous distention. No thyroid enlargement, no tenderness.  LUNGS: Normal breath sounds bilaterally, no wheezing, rales, rhonchi. No use of accessory muscles of respiration.  CARDIOVASCULAR: S1, S2 normal. No murmurs, rubs, or gallops.  ABDOMEN: Soft, nontender, nondistended. Bowel sounds present. No organomegaly or mass.  Ileal conduit in place with yellow urine draining. EXTREMITIES: No cyanosis, clubbing, left upper extremity blistering and  weeping edema, +1-2 edema in the lower extremities bilaterally.   NEUROLOGIC: Cranial nerves II through XII are intact. No focal Motor or sensory deficits b/l.  Globally weak. PSYCHIATRIC: The patient is alert and oriented x 3.  SKIN: No obvious rash, lesion, or ulcer.   DATA REVIEW:   CBC Recent Labs  Lab 05/01/19 0422  WBC 9.7  HGB 8.9*  HCT 27.8*  PLT 196    Chemistries  Recent Labs  Lab 05/01/19 0422  NA 137  K 4.1  CL 112*  CO2 16*  GLUCOSE 149*  BUN 57*  CREATININE 2.43*  CALCIUM 7.4*    Cardiac Enzymes No results for input(s): TROPONINI in the last 168 hours.  Microbiology Results  Results for orders placed or performed during the hospital encounter of 04/29/19  Novel  Coronavirus, NAA (Hosp order, Send-out to Ref Lab; TAT 18-24 hrs     Status: None   Collection Time: 04/29/19 11:08 AM   Specimen: Nasopharyngeal Swab; Respiratory  Result Value Ref Range Status   SARS-CoV-2, NAA NOT DETECTED NOT DETECTED Final    Comment: (NOTE) This nucleic acid amplification test was developed and its performance characteristics determined by [employer]. Nucleic acid amplification tests include PCR and TMA. This test has not been FDA cleared or approved. This test has been authorized by FDA under an Emergency Use Authorization (EUA). This test is only authorized for the duration of time the declaration that circumstances exist justifying the authorization of the emergency use of in vitro diagnostic tests for detection of SARS-CoV-2 virus and/or diagnosis of COVID-19 infection under section 564(b)(1) of the Act, 21 U.S.C. 919TYO-0(A) (1), unless the authorization is terminated or revoked sooner. When diagnostic testing is negative, the possibility of a false negative result should be considered in the context of a patient's recent exposures and the presence of clinical signs and symptoms consistent with COVID-19. An individual without symptoms of COVID- 19 and who is not shedding SARS-CoV-2 vi rus would expect to have a negative (not detected) result in this assay. Performed At: [facility] [address] [place], [place] [id number] Rush Farmer MD FS:[id number]    [place]  Final    Comment: Performed at [facility], [address] [place], [place] [address]  Blood culture (routine x 2)     Status: None (Preliminary result)   Collection Time: 04/29/19 12:23 PM   Specimen: BLOOD  Result Value Ref Range Status   Specimen Description BLOOD LEFT ANTECUBITAL  Final   Special Requests   Final    BOTTLES DRAWN AEROBIC AND ANAEROBIC Blood Culture adequate volume   Culture   Final    NO GROWTH 2 DAYS Performed  at [facility], [address] [place], [place] [address]    Report Status PENDING  Incomplete  Blood culture (routine x 2)     Status: None (Preliminary result)   Collection Time: 04/29/19  5:41 PM   Specimen: BLOOD  Result Value Ref Range Status   Specimen Description BLOOD RIGHT ANTECUBITAL  Final   Special Requests   Final    BOTTLES DRAWN AEROBIC AND ANAEROBIC Blood Culture adequate volume   Culture   Final    NO GROWTH 2 DAYS Performed at [facility], [address] [place], [place] [address]    Report Status PENDING  Incomplete  C difficile quick scan w PCR reflex     Status: Abnormal   Collection Time: 04/29/19  9:20 PM  Specimen: STOOL  Result Value Ref Range Status   C Diff antigen POSITIVE (A) NEGATIVE Final   C Diff toxin NEGATIVE NEGATIVE Final   C Diff interpretation Results are indeterminate. See PCR results.  Final    Comment: Performed at [facility], [address] [place], [place] [address]  C. Diff by PCR, Reflexed     Status: Abnormal   Collection Time: 04/29/19  9:20 PM  Result Value Ref Range Status   Toxigenic C. Difficile by PCR POSITIVE (A) NEGATIVE Final    Comment: Positive for toxigenic C. difficile with little to no toxin production. Only treat if clinical presentation suggests symptomatic illness. Performed at [facility], [address] [place], [place] [address]   MRSA PCR Screening     Status: None   Collection Time: 04/30/19  7:30 AM   Specimen: Nasopharyngeal  Result Value Ref Range Status   MRSA by PCR NEGATIVE NEGATIVE Final    Comment:        The GeneXpert MRSA Assay (FDA approved for NASAL specimens only), is one component of a comprehensive MRSA colonization surveillance program. It is not intended to diagnose MRSA infection nor to guide or monitor treatment for MRSA infections. Performed at [facility], [address], [place], [place] [address]     RADIOLOGY:  No  results found.    Management plans discussed with the patient, family and they are in agreement.  CODE STATUS:     Code Status Orders  (From admission, onward)         Start     Ordered   04/29/19 1759  Limited resuscitation (code)  Continuous    Question Answer Comment  In the event of cardiac or respiratory ARREST: Initiate Code Blue, Call Rapid Response Yes   In the event of cardiac or respiratory ARREST: Perform CPR Yes   In the event of cardiac or respiratory ARREST: Perform Intubation/Mechanical Ventilation No   In the event of cardiac or respiratory ARREST: Use NIPPV/BiPAp only if indicated Yes   In the event of cardiac or respiratory ARREST: Administer ACLS medications if indicated Yes   In the event of cardiac or respiratory ARREST: Perform Defibrillation or Cardioversion if indicated Yes      04/29/19 1758          TOTAL TIME TAKING CARE OF THIS PATIENT: 40 minutes.    Henreitta Leber M.D on 05/01/2019 at 2:23 PM  Between 7am to 6pm - Pager - [phone]  After 6pm go to [email or web address] - Proofreader  [facility]  Office  [phone]  CC: Primary care physician; Tracie Harrier, MD

## 2019-05-01 NOTE — Progress Notes (Signed)
Pt discharged per MD order. IV removed. Discharge instructions reviewed with pt. Pt verbalized understanding with all questions answered to pt satisfaction. Pt taken to car in wheelchair by staff.  

## 2019-05-01 NOTE — TOC Initial Note (Signed)
Transition of Care [facility]) - Initial/Assessment Note    Patient Details  Name: Bryan Brewer MRN: [id number] Date of Birth: [birth year]  Transition of Care [facility]) CM/SW Contact:    Beverly Sessions, RN Phone Number: 05/01/2019, 4:51 PM  Clinical Narrative:                  Patient admitted from home with hypoglycemia  Patient lives at home with wife Patient states that he is still able to drive, but his wife provides transportation most of the time.   [facility]- denies issues obtaining medications  Patient states that he has a RW and cane in the home.     PT has assessed patient and recommends home health.  Patient declines all services.  It appears that patient has declined on previous admissions as well.  Patient states that he doesn't want anyone coming to the home.  Patient to discharge on oral vanc.  Script called in to [facility].  Out of pocket cost $85.47.  Patient and wife both confirm they will be able to get it at time of discharge.      Patient Goals and CMS Choice        Expected Discharge Plan and Services           Expected Discharge Date: 05/01/19                                    Prior Living Arrangements/Services                       Activities of Daily Living Home Assistive Devices/Equipment: [name] (specify quad or straight) ADL Screening (condition at time of admission) Patient's cognitive ability adequate to safely complete daily activities?: Yes Is the patient deaf or have difficulty hearing?: No Does the patient have difficulty seeing, even when wearing glasses/contacts?: No Does the patient have difficulty concentrating, remembering, or making decisions?: No Patient able to express need for assistance with ADLs?: Yes Does the patient have difficulty dressing or bathing?: No Independently performs ADLs?: No Communication: Independent Does the patient have difficulty walking or climbing stairs?:  No Weakness of Legs: Both Weakness of Arms/Hands: None  Permission Sought/Granted                  Emotional Assessment              Admission diagnosis:  Hypoglycemia [E16.2] Patient Active Problem List   Diagnosis Date Noted  . Hypoglycemia 04/29/2019  . Hypotension 04/29/2019  . Sinus tachycardia   . GIB (gastrointestinal bleeding) 04/20/2019  . Pressure injury of skin 03/31/2019  . HCAP (healthcare-associated pneumonia) 03/29/2019  . UTI (urinary tract infection) 03/29/2019  . Severe sepsis with septic shock ([place]) 03/29/2019  . Hyponatremia 03/29/2019  . Acute on chronic renal failure ([place]) 02/14/2019  . NSTEMI (non-ST elevated myocardial infarction) ([place]) 10/30/2017  . Diabetes ([place]) 10/30/2017  . CKD (chronic kidney disease), stage IV ([place]) 10/30/2017  . HTN (hypertension) 10/30/2017  . HLD (hyperlipidemia) 10/30/2017  . COPD (chronic obstructive pulmonary disease) ([place]) 10/30/2017   PCP:  Tracie Harrier, MD Pharmacy:   [id number] [address], [place] [address] Phone: [phone] Fax: [phone]  [place], [place] [address] Phone:  [phone] Fax: [phone]     Social Determinants of Health (SDOH) Interventions    Readmission Risk Interventions No flowsheet data found.

## 2019-05-04 ENCOUNTER — Telehealth: Payer: Self-pay | Admitting: Gastroenterology

## 2019-05-04 LAB — CULTURE, BLOOD (ROUTINE X 2)
Culture: NO GROWTH
Culture: NO GROWTH
Special Requests: ADEQUATE
Special Requests: ADEQUATE

## 2019-05-04 NOTE — Telephone Encounter (Signed)
No vm to offer apt with Dr. Vicente Males

## 2019-05-04 NOTE — Telephone Encounter (Signed)
-----   Message from Shelby Mattocks, [place] sent at 04/29/2019  8:49 AM EDT ----- Patient needs hospital follow up with Dr. Vicente Males.  Thank you

## 2019-05-05 LAB — GI PATHOGEN PANEL BY PCR, STOOL
Adenovirus F 40/41: NOT DETECTED
Astrovirus: NOT DETECTED
C difficile toxin A/B: DETECTED — AB
Campylobacter by PCR: NOT DETECTED
Cryptosporidium by PCR: NOT DETECTED
Cyclospora cayetanensis: NOT DETECTED
E coli (ETEC) LT/ST: NOT DETECTED
E coli (STEC): NOT DETECTED
Entamoeba histolytica: NOT DETECTED
Enteroaggregative E coli: NOT DETECTED
Enteropathogenic E coli: NOT DETECTED
G lamblia by PCR: NOT DETECTED
Norovirus GI/GII: NOT DETECTED
Plesiomonas shigelloides: NOT DETECTED
Rotavirus A by PCR: NOT DETECTED
Salmonella by PCR: NOT DETECTED
Sapovirus: NOT DETECTED
Shigella by PCR: NOT DETECTED
Vibrio cholerae: NOT DETECTED
Vibrio: NOT DETECTED
Yersinia enterocolitica: NOT DETECTED

## 2019-05-06 NOTE — Telephone Encounter (Signed)
Patient has an appointment on 10/1 with Christell Faith, PA.

## 2019-05-07 ENCOUNTER — Ambulatory Visit: Payer: Medicare Other | Admitting: Physician Assistant

## 2019-05-13 ENCOUNTER — Other Ambulatory Visit
Admission: RE | Admit: 2019-05-13 | Discharge: 2019-05-13 | Disposition: A | Discharge status: Home / Self Care | Payer: Medicare Other | Source: Ambulatory Visit | Attending: Internal Medicine | Admitting: Internal Medicine

## 2019-05-13 ENCOUNTER — Ambulatory Visit (INDEPENDENT_AMBULATORY_CARE_PROVIDER_SITE_OTHER): Payer: Medicare Other | Admitting: Internal Medicine

## 2019-05-13 ENCOUNTER — Encounter: Payer: Self-pay | Admitting: Internal Medicine

## 2019-05-13 ENCOUNTER — Ambulatory Visit (INDEPENDENT_AMBULATORY_CARE_PROVIDER_SITE_OTHER): Payer: Medicare Other

## 2019-05-13 ENCOUNTER — Other Ambulatory Visit: Payer: Self-pay

## 2019-05-13 VITALS — BP 120/60 | HR 75 | Ht 67.0 in | Wt 165.0 lb

## 2019-05-13 DIAGNOSIS — L97201 Non-pressure chronic ulcer of unspecified calf limited to breakdown of skin: Secondary | ICD-10-CM

## 2019-05-13 DIAGNOSIS — L03119 Cellulitis of unspecified part of limb: Secondary | ICD-10-CM

## 2019-05-13 DIAGNOSIS — I10 Essential (primary) hypertension: Secondary | ICD-10-CM | POA: Diagnosis present

## 2019-05-13 DIAGNOSIS — N184 Chronic kidney disease, stage 4 (severe): Secondary | ICD-10-CM

## 2019-05-13 DIAGNOSIS — I2510 Atherosclerotic heart disease of native coronary artery without angina pectoris: Secondary | ICD-10-CM | POA: Diagnosis not present

## 2019-05-13 DIAGNOSIS — R000 Tachycardia, unspecified: Secondary | ICD-10-CM

## 2019-05-13 DIAGNOSIS — R002 Palpitations: Secondary | ICD-10-CM

## 2019-05-13 DIAGNOSIS — I5033 Acute on chronic diastolic (congestive) heart failure: Secondary | ICD-10-CM

## 2019-05-13 LAB — BASIC METABOLIC PANEL
Anion gap: 11 (ref 5–15)
BUN: 48 mg/dL — ABNORMAL HIGH (ref 8–23)
CO2: 16 mmol/L — ABNORMAL LOW (ref 22–32)
Calcium: 8.1 mg/dL — ABNORMAL LOW (ref 8.9–10.3)
Chloride: 115 mmol/L — ABNORMAL HIGH (ref 98–111)
Creatinine, Ser: 2.36 mg/dL — ABNORMAL HIGH (ref 0.61–1.24)
GFR calc Af Amer: 30 mL/min — ABNORMAL LOW (ref >60)
GFR calc non Af Amer: 26 mL/min — ABNORMAL LOW (ref >60)
Glucose, Bld: 110 mg/dL — ABNORMAL HIGH (ref 70–99)
Potassium: 4.6 mmol/L (ref 3.5–5.1)
Sodium: 142 mmol/L (ref 135–145)

## 2019-05-13 MED ORDER — FUROSEMIDE 80 MG PO TABS: 80.0000 mg | ORAL_TABLET | Freq: Every day | ORAL | 3 refills | Status: DC

## 2019-05-13 NOTE — Progress Notes (Signed)
Follow-up Outpatient Visit Date: 05/13/2019  Primary Care Provider: Tracie Harrier, [place] [address]  Chief Complaint: Leg swelling  HPI:  Mr. Kretschmer is a 78 y.o. year-old male with history of CAD s/p CABG (2019 at [facility]; LIMA-LAD, SVG-diagonal, SVG-OM1, and SVG-OM2), HTN, HLD, DM2, COPD, CKD stage 4, and bladder cancer, who presents for follow-up of coronary disease.  We saw him during a hospitalization last month in the setting of transient tachycardia during EGD for evaluation of upper GI bleed.  Tachycardia was thought to be procedurally related, as it resolved quite quickly.  He was asymptomatic at the time of our evaluation no further work-up was recommended.  He was hospitalized again a week later due to hypoglycemia with associated weakness and altered mental status.  He was also found to be C. difficile positive at that time with associated diarrhea.  Today, Mr. Sevey reports increased leg swelling and shortness of breath since leaving the hospital ~2 weeks ago.  He has also developed small ulcers on his calves with clear fluid discharge.   He was seen at [facility] today for knee injection but was unable to proceed with this due to possible cellulitis of both calves.  His legs feel tight; he denies fevers and chills.  He was given a prescription for cephalexin.  Mr. Ratchford denies chest pain palpitations.  He also denies orthopnea but later states that he has been sleeping in a recliner for quite some time.  He has put on ~20 pounds over the last month.  He reports being compliant with his medications.  His wife notes that Mr. Nauta pulse is irregular at times.  He received the ZIO monitor in the mail but was unsure how to apply it to his chest (he brings the monitor with him today).  --------------------------------------------------------------------------------------------------  Past Medical History:  Diagnosis Date  .  Bladder cancer ([place])   . COPD (chronic obstructive pulmonary disease) ([place])   . Coronary artery disease   . Hyperlipidemia   . Hypertension   . Kidney disease   . Type 2 diabetes mellitus ([place])    Past Surgical History:  Procedure Laterality Date  . COLONOSCOPY    . CORONARY ARTERY BYPASS GRAFT  2019   [facility]; LIMA-LAD, SVG-diagonal, SVG-OM1, and SVG-OM2  . ESOPHAGOGASTRODUODENOSCOPY N/A 04/23/2019   Procedure: ESOPHAGOGASTRODUODENOSCOPY (EGD);  Surgeon: Jonathon Bellows, MD;  Location: [facility] ENDOSCOPY;  Service: Gastroenterology;  Laterality: N/A;  . ESOPHAGOGASTRODUODENOSCOPY (EGD) WITH PROPOFOL N/A 04/21/2019   Procedure: ESOPHAGOGASTRODUODENOSCOPY (EGD) WITH PROPOFOL;  Surgeon: Jonathon Bellows, MD;  Location: [facility] ENDOSCOPY;  Service: Gastroenterology;  Laterality: N/A;  . LEFT HEART CATH AND CORONARY ANGIOGRAPHY N/A 10/31/2017   Procedure: LEFT HEART CATH AND CORONARY ANGIOGRAPHY;  Surgeon: Yolonda Kida, MD;  Location: [facility] LAB;  Service: Cardiovascular;  Laterality: N/A;  . REVISION UROSTOMY CUTANEOUS      Current Meds  Medication Sig  . aspirin EC 81 MG tablet Take 81 mg by mouth every evening.   Marland Kitchen atorvastatin (LIPITOR) 80 MG tablet Take 80 mg by mouth at bedtime.  . blood glucose meter kit and supplies KIT Dispense based on patient and insurance preference. Use up to four times daily as directed. (FOR ICD-9 250.00, 250.01).  . cephALEXin (KEFLEX) 500 MG capsule Take 500 mg by mouth daily.  Marland Kitchen glipiZIDE (GLUCOTROL) 5 MG tablet Take 0.5 tablets (2.5 mg total) by mouth daily before breakfast.  . ipratropium-albuterol (DUONEB) 0.5-2.5 (3) MG/3ML  SOLN Take 3 mLs by nebulization every 6 (six) hours as needed (wheezing, shortness of breath).  . metoprolol tartrate (LOPRESSOR) 50 MG tablet Take 1 tablet (50 mg total) by mouth 2 (two) times daily.  . mometasone-formoterol (DULERA) 200-5 MCG/ACT AERO Inhale 2 puffs into the lungs 2 (two) times daily.  . pantoprazole (PROTONIX) 40 MG  tablet Take 1 tablet (40 mg total) by mouth daily.  . sodium bicarbonate 650 MG tablet Take 1,300 mg by mouth 3 (three) times daily.  . [DISCONTINUED] furosemide (LASIX) 40 MG tablet Take 40 mg by mouth daily.    Allergies: Patient has no known allergies.  Social History   Tobacco Use  . Smoking status: Former Smoker    Packs/day: 1.50    Years: 35.00    Pack years: 52.50    Types: Cigarettes    Quit date: 2013    Years since quitting: 7.7  . Smokeless tobacco: Never Used  Substance Use Topics  . Alcohol use: No    Frequency: Never  . Drug use: Never    Family History  Problem Relation Age of Onset  . CAD Mother   . Stroke Mother   . CAD Father   . Stroke Father   . Breast cancer Sister     Review of Systems: A 12-system review of systems was performed and was negative except as noted in the HPI.  --------------------------------------------------------------------------------------------------  Physical Exam: BP 120/60 (BP Location: Right Arm, Patient Position: Sitting, Cuff Size: Normal)   Pulse 75   Ht 5' 7"  (1.702 m)   Wt 165 lb (74.8 kg)   SpO2 99%   BMI 25.84 kg/m   General:  NAD HEENT: No conjunctival pallor or scleral icterus. Facemask in place. Neck: Supple without lymphadenopathy or thyromegaly.  JVP ~8 cm with positive HJR. Lungs: Normal work of breathing. Clear to auscultation bilaterally without wheezes or crackles. Heart: Regular rate and rhythm with occasional extrasystoles.  No murmurs, rubs, or gallops.  Abd: Bowel sounds present. Soft, NT/ND without hepatosplenomegaly Ext: 3+ pitting edema noted in both calves. Skin: Warm.  Superficial ulcers with scant serous fluid drainage noted on both calves.  Surrounding erythema noted.  EKG:  NSR with nonspecific ST/T changes.  Lab Results  Component Value Date   WBC 9.7 05/01/2019   HGB 8.9 (L) 05/01/2019   HCT 27.8 (L) 05/01/2019   MCV 89.4 05/01/2019   PLT 196 05/01/2019    Lab Results   Component Value Date   NA 142 05/13/2019   K 4.6 05/13/2019   CL 115 (H) 05/13/2019   CO2 16 (L) 05/13/2019   BUN 48 (H) 05/13/2019   CREATININE 2.36 (H) 05/13/2019   GLUCOSE 110 (H) 05/13/2019   ALT 14 03/30/2019    No results found for: CHOL, HDL, LDLCALC, LDLDIRECT, TRIG, CHOLHDL  --------------------------------------------------------------------------------------------------  ASSESSMENT AND PLAN: Acute on chronic HFpEF: Mr. Dilauro is significant volume overloaded today and has gained about 20 pounds over the last month.  He also reports increasing shortness of breath consistent with NYHA class III HF.  This is complicated by his CKD.  We will increase furosemide to 80 mg daily.  I have encouraged him to elevate his legs whenever possible.  We will continue metoprolol tartrate 50 mg BID.  Coronary artery disease: No chest pain reported worsening shortness of breath is more likely to be due to HF than angina.  We will continue current medications for secondary prevention, including ASA, metoprolol, and atorvastatin.  Cellulitis and  calf ulcers: Most likely due to venous stasis but pedal pulses are difficult to palpate in the setting of marked edema.  Cephalexin started by orthopedics today, which is reasonable.  Hopefully, diuresis will improve edema and venous stasis.  If not, we will need to consider wound care referral.  Arterial studies will likely be needed in the future but we will defer this until edema has improved.  Tachycardia: No symptoms reported by Mr. Kosh.  EKG today shows NSR.  Some extrasystoles noted on exam.  We will continue metoprolol tartrate 50 mg BID.  ZIO monitor applied today, to be worn for 14 days.  Chronic kidney disease stage 4: We will check BMP today in the setting of worsening edema and dose escalation of furosemide.  Repeat BMP will be needed when Mr. Munger is seen for follow-up next week.  Avoid nephrotoxic drugs.  Follow-up: RTC 1 week; he  will need repeat BMP at that time.  Nelva Bush, MD 05/14/2019 9:00 PM

## 2019-05-13 NOTE — Patient Instructions (Signed)
Medication Instructions:  Your physician has recommended you make the following change in your medication:  1- INCREASE Furosemide to 80 mg by mouth once a day.  If you need a refill on your cardiac medications before your next appointment, please call your pharmacy.   Lab work: Your physician recommends that you return for lab work in: [place] at [employer] as you leave. BMET. Please go to the [facility]. You will check in at the front desk to the right as you walk into the atrium. Valet Parking is offered if needed.   If you have labs (blood work) drawn today and your tests are completely normal, you will receive your results only by: Marland Kitchen MyChart Message (if you have MyChart) OR . A paper copy in the mail If you have any lab test that is abnormal or we need to change your treatment, we will call you to review the results.  Testing/Procedures: NONE  Follow-Up: At [facility], you and your health needs are our priority.  As part of our continuing mission to provide you with exceptional heart care, we have created designated Provider Care Teams.  These Care Teams include your primary Cardiologist (physician) and Advanced Practice Providers (APPs -  Physician Assistants and Nurse Practitioners) who all work together to provide you with the care you need, when you need it. You will need a follow up appointment in 1 weeks with APP or Dr END.  You may see Nelva Bush, MD or one of the following Advanced Practice Providers on your designated Care Team:   Murray Hodgkins, NP Christell Faith, PA-C . Marrianne Mood, PA-C

## 2019-05-14 ENCOUNTER — Other Ambulatory Visit: Payer: Self-pay | Admitting: *Deleted

## 2019-05-14 ENCOUNTER — Telehealth: Payer: Self-pay | Admitting: *Deleted

## 2019-05-14 ENCOUNTER — Telehealth: Payer: Self-pay | Admitting: Internal Medicine

## 2019-05-14 ENCOUNTER — Encounter: Payer: Self-pay | Admitting: Internal Medicine

## 2019-05-14 DIAGNOSIS — I5032 Chronic diastolic (congestive) heart failure: Secondary | ICD-10-CM | POA: Insufficient documentation

## 2019-05-14 DIAGNOSIS — I2510 Atherosclerotic heart disease of native coronary artery without angina pectoris: Secondary | ICD-10-CM | POA: Insufficient documentation

## 2019-05-14 DIAGNOSIS — I5033 Acute on chronic diastolic (congestive) heart failure: Secondary | ICD-10-CM | POA: Insufficient documentation

## 2019-05-14 DIAGNOSIS — L97201 Non-pressure chronic ulcer of unspecified calf limited to breakdown of skin: Secondary | ICD-10-CM | POA: Insufficient documentation

## 2019-05-14 DIAGNOSIS — L03119 Cellulitis of unspecified part of limb: Secondary | ICD-10-CM | POA: Insufficient documentation

## 2019-05-14 MED ORDER — FUROSEMIDE 80 MG PO TABS: 80.0000 mg | ORAL_TABLET | Freq: Every day | ORAL | 3 refills | Status: DC

## 2019-05-14 NOTE — Telephone Encounter (Signed)
Requested Prescriptions   Signed Prescriptions Disp Refills  . furosemide (LASIX) 80 MG tablet 30 tablet 3    Sig: Take 1 tablet (80 mg total) by mouth daily.    Authorizing Provider: END, CHRISTOPHER    Ordering User: Britt Bottom

## 2019-05-14 NOTE — Telephone Encounter (Signed)
°*  STAT* If patient is at the pharmacy, call can be transferred to refill team.   1. Which medications need to be refilled? (please list name of each medication and dose if known)   Furosemide 80 mg po q d   2. Which pharmacy/location (including street and city if local pharmacy) is medication to be sent to?  [facility] [address]   3. Do they need a 30 day or 90 day supply? [place]

## 2019-05-14 NOTE — Telephone Encounter (Signed)
.  Notes recorded by Nelva Bush, MD on 05/13/2019 at 9:51 PM EDT  Normal sodium and potassium. Renal function remains severely impaired but is stable. I recommend proceeding with increased dose of furosemide (80 mg daily) as discussed at today's visit and f/u in the office in 1 week.   Results called to pt. Pt verbalized understanding. Went ahead and scheduled patient for 1 week follow up with Dr End.  Patient is aware of appointment date and time.

## 2019-05-18 NOTE — Progress Notes (Signed)
Follow-up Outpatient Visit Date: 05/20/2019  Primary Care Provider: Tracie Harrier, [place] [address]  Chief Complaint: Follow-up leg swelling and shortness of breath  HPI:  Mr. Burgueno is a 78 y.o. year-old male with history of  CAD s/p CABG (2019 at [facility]; LIMA-LAD, SVG-diagonal, SVG-OM1, and SVG-OM2), HTN, HLD, DM2, COPD, CKD stage 4, and bladder cancer, who presents for follow-up of acute on chronic HFpEF complicated by bilateral lower extremity skin ulcers.  I last saw him a week ago, at which time Mr. Roza reported worsening leg swelling and shortness of breath.  He pound weight gain was noted over the preceding month.  He also had developed weeping ulcers on both calves with surrounding erythema, for which he had just been placed on cephalexin by orthopedics for possible cellulitis.  We agreed to increase furosemide to 80 mg daily.  ZIO Patch (previously ordered) was applied for assessment of transient tachycardia noted during EGD last month.  Today, Mr. Dase reports feeling better with significant improvement in leg swelling.  Bilateral calf erythema has also improved (though not completely resolved).  Drainage from superficial ulcers has resolved.  He is hopeful that he will be able to receive his knee injection as early as this afternoon when he follows up with orthopedics.  His shortness of breath has also improved; he denies orthopnea and PND.  He continues to have intermittent epigastric pain as well as loose stools, which he attributes to his recent upper GI bleed.  Mr. Sitar denies chest pain, palpitations and lightheadedness.  --------------------------------------------------------------------------------------------------  Past Medical History:  Diagnosis Date  . Bladder cancer ([place])   . COPD (chronic obstructive pulmonary disease) ([place])   . Coronary artery disease   . Hyperlipidemia   . Hypertension   . Kidney disease    . Type 2 diabetes mellitus ([place])    Past Surgical History:  Procedure Laterality Date  . COLONOSCOPY    . CORONARY ARTERY BYPASS GRAFT  2019   [facility]; LIMA-LAD, SVG-diagonal, SVG-OM1, and SVG-OM2  . ESOPHAGOGASTRODUODENOSCOPY N/A 04/23/2019   Procedure: ESOPHAGOGASTRODUODENOSCOPY (EGD);  Surgeon: Jonathon Bellows, MD;  Location: [facility] ENDOSCOPY;  Service: Gastroenterology;  Laterality: N/A;  . ESOPHAGOGASTRODUODENOSCOPY (EGD) WITH PROPOFOL N/A 04/21/2019   Procedure: ESOPHAGOGASTRODUODENOSCOPY (EGD) WITH PROPOFOL;  Surgeon: Jonathon Bellows, MD;  Location: [facility] ENDOSCOPY;  Service: Gastroenterology;  Laterality: N/A;  . LEFT HEART CATH AND CORONARY ANGIOGRAPHY N/A 10/31/2017   Procedure: LEFT HEART CATH AND CORONARY ANGIOGRAPHY;  Surgeon: Yolonda Kida, MD;  Location: [facility] LAB;  Service: Cardiovascular;  Laterality: N/A;  . REVISION UROSTOMY CUTANEOUS      Current Meds  Medication Sig  . aspirin EC 81 MG tablet Take 81 mg by mouth every evening.   Marland Kitchen atorvastatin (LIPITOR) 80 MG tablet Take 80 mg by mouth at bedtime.  . blood glucose meter kit and supplies KIT Dispense based on patient and insurance preference. Use up to four times daily as directed. (FOR ICD-9 250.00, 250.01).  . cephALEXin (KEFLEX) 500 MG capsule Take 500 mg by mouth daily.  . furosemide (LASIX) 80 MG tablet Take 1 tablet (80 mg total) by mouth daily.  Marland Kitchen glipiZIDE (GLUCOTROL) 5 MG tablet Take 0.5 tablets (2.5 mg total) by mouth daily before breakfast.  . ipratropium-albuterol (DUONEB) 0.5-2.5 (3) MG/3ML SOLN Take 3 mLs by nebulization every 6 (six) hours as needed (wheezing, shortness of breath).  . metoprolol tartrate (LOPRESSOR) 50 MG tablet Take 1 tablet (50 mg  total) by mouth 2 (two) times daily.  . mometasone-formoterol (DULERA) 200-5 MCG/ACT AERO Inhale 2 puffs into the lungs 2 (two) times daily.  . pantoprazole (PROTONIX) 40 MG tablet Take 1 tablet (40 mg total) by mouth daily.  . sodium bicarbonate 650 MG  tablet Take 1,300 mg by mouth 3 (three) times daily.    Allergies: Patient has no known allergies.  Social History   Tobacco Use  . Smoking status: Former Smoker    Packs/day: 1.50    Years: 35.00    Pack years: 52.50    Types: Cigarettes    Quit date: 2013    Years since quitting: 7.7  . Smokeless tobacco: Never Used  Substance Use Topics  . Alcohol use: No    Frequency: Never  . Drug use: Never    Family History  Problem Relation Age of Onset  . CAD Mother   . Stroke Mother   . CAD Father   . Stroke Father   . Breast cancer Sister     Review of Systems: A 12-system review of systems was performed and was negative except as noted in the HPI.  --------------------------------------------------------------------------------------------------  Physical Exam: BP (!) 94/55 (BP Location: Left Arm, Patient Position: Sitting, Cuff Size: Normal)   Pulse 68   Ht _0  (1.702 m)   Wt 154 lb 8 oz (70.1 kg)   BMI 24.20 kg/m   General:  NAD. HEENT: No conjunctival pallor or scleral icterus. Facemask in place Neck: Supple without lymphadenopathy, thyromegaly, JVD, or HJR. Lungs: Normal work of breathing. Clear to auscultation bilaterally without wheezes or crackles. Heart: Regular rate and rhythm without murmurs, rubs, or gallops. Non-displaced PMI. Abd: Bowel sounds present. Soft, NT/ND without hepatosplenomegaly EHO:ZYYQM to 1+ calf edema bilaterally.  Skin: Warm and dry.  Patchy erythema on both calves, improved from 1 week ago.  EKG:  NSR with PVC's and non-specific ST/T changes.  Lab Results  Component Value Date   WBC 13.1 (H) 05/20/2019   HGB 9.1 (L) 05/20/2019   HCT 27.8 (L) 05/20/2019   MCV 89 05/20/2019   PLT 321 05/20/2019    Lab Results  Component Value Date   NA 142 05/20/2019   K 3.3 (L) 05/20/2019   CL 105 05/20/2019   CO2 22 05/20/2019   BUN 41 (H) 05/20/2019   CREATININE 2.44 (H) 05/20/2019   GLUCOSE 159 (H) 05/20/2019   ALT 14 03/30/2019     No results found for: CHOL, HDL, LDLCALC, LDLDIRECT, TRIG, CHOLHDL  --------------------------------------------------------------------------------------------------  ASSESSMENT AND PLAN: Acute on chronic HFpEF: Leg edema and dyspnea have improved significantly since last week, though some calf edema still persists.  There is no significant JVD today.  Weight is also down 9 pounds over the last week.  I will check a BMP today to reassess renal function and potassium but would like to continue with furosemide 80 mg daily, if possible.  Bilateral lower extremity cellulitis: Improving with course of antibiotics prescribed last week by orthopedics.  Mr. Splitt is scheduled for follow-up with orthopedics later today.  Coronary artery disease: No signs or symptoms or worsening coronary insufficiency.  Continue current medications for secondary prevention.  Tachycardia: No palpitations reported by the patient.  ZIO monitor placed last week is still in place.  We will follow-up after completion of the monitor.  Anemia: Patient with recent upper GI bleed.  I will recheck a CBC today to see if hemoglobin is improving.  He may benefit from iron supplementation, though  I will defer that to his PCP and/or GI.  Chronic kidney disease stage 4: Improved edema, as above.  We will repeat BMP today to ensure stable renal function and electrolytes.  I anticipate continuation of furosemide 80 mg daily unless there has been a significant decline in GFR.  Follow-up: Return to clinic in 1 month.  Nelva Bush, MD 05/21/2019 9:09 AM

## 2019-05-20 ENCOUNTER — Ambulatory Visit (INDEPENDENT_AMBULATORY_CARE_PROVIDER_SITE_OTHER): Payer: Medicare Other | Admitting: Internal Medicine

## 2019-05-20 ENCOUNTER — Other Ambulatory Visit: Payer: Self-pay

## 2019-05-20 ENCOUNTER — Encounter: Payer: Self-pay | Admitting: Internal Medicine

## 2019-05-20 VITALS — BP 94/55 | HR 68 | Ht 67.0 in | Wt 154.5 lb

## 2019-05-20 DIAGNOSIS — R000 Tachycardia, unspecified: Secondary | ICD-10-CM

## 2019-05-20 DIAGNOSIS — D649 Anemia, unspecified: Secondary | ICD-10-CM

## 2019-05-20 DIAGNOSIS — I5032 Chronic diastolic (congestive) heart failure: Secondary | ICD-10-CM | POA: Diagnosis not present

## 2019-05-20 DIAGNOSIS — L03119 Cellulitis of unspecified part of limb: Secondary | ICD-10-CM | POA: Diagnosis not present

## 2019-05-20 DIAGNOSIS — N184 Chronic kidney disease, stage 4 (severe): Secondary | ICD-10-CM

## 2019-05-20 DIAGNOSIS — Z79899 Other long term (current) drug therapy: Secondary | ICD-10-CM

## 2019-05-20 DIAGNOSIS — I5033 Acute on chronic diastolic (congestive) heart failure: Secondary | ICD-10-CM

## 2019-05-20 DIAGNOSIS — I2510 Atherosclerotic heart disease of native coronary artery without angina pectoris: Secondary | ICD-10-CM

## 2019-05-20 NOTE — Patient Instructions (Signed)
Medication Instructions:  Your physician recommends that you continue on your current medications as directed. Please refer to the Current Medication list given to you today.  If you need a refill on your cardiac medications before your next appointment, please call your pharmacy.   Lab work: Your physician recommends that you return for lab work in: today - [place], BMET.  If you have labs (blood work) drawn today and your tests are completely normal, you will receive your results only by: Marland Kitchen MyChart Message (if you have MyChart) OR . A paper copy in the mail If you have any lab test that is abnormal or we need to change your treatment, we will call you to review the results.  Testing/Procedures: NONE  Follow-Up: At [facility], you and your health needs are our priority.  As part of our continuing mission to provide you with exceptional heart care, we have created designated Provider Care Teams.  These Care Teams include your primary Cardiologist (physician) and Advanced Practice Providers (APPs -  Physician Assistants and Nurse Practitioners) who all work together to provide you with the care you need, when you need it. You will need a follow up appointment in 1 months.  You may see Nelva Bush, MD or one of the following Advanced Practice Providers on your designated Care Team:   Murray Hodgkins, NP Christell Faith, PA-C . Marrianne Mood, PA-C

## 2019-05-21 ENCOUNTER — Encounter: Payer: Self-pay | Admitting: Internal Medicine

## 2019-05-21 ENCOUNTER — Telehealth: Payer: Self-pay | Admitting: *Deleted

## 2019-05-21 DIAGNOSIS — I5032 Chronic diastolic (congestive) heart failure: Secondary | ICD-10-CM

## 2019-05-21 DIAGNOSIS — D649 Anemia, unspecified: Secondary | ICD-10-CM | POA: Insufficient documentation

## 2019-05-21 DIAGNOSIS — Z79899 Other long term (current) drug therapy: Secondary | ICD-10-CM

## 2019-05-21 LAB — BASIC METABOLIC PANEL
BUN/Creatinine Ratio: 17 (ref 10–24)
BUN: 41 mg/dL — ABNORMAL HIGH (ref 8–27)
CO2: 22 mmol/L (ref 20–29)
Calcium: 8 mg/dL — ABNORMAL LOW (ref 8.6–10.2)
Chloride: 105 mmol/L (ref 96–106)
Creatinine, Ser: 2.44 mg/dL — ABNORMAL HIGH (ref 0.76–1.27)
GFR calc Af Amer: 28 mL/min/{1.73_m2} — ABNORMAL LOW (ref >59)
GFR calc non Af Amer: 25 mL/min/{1.73_m2} — ABNORMAL LOW (ref >59)
Glucose: 159 mg/dL — ABNORMAL HIGH (ref 65–99)
Potassium: 3.3 mmol/L — ABNORMAL LOW (ref 3.5–5.2)
Sodium: 142 mmol/L (ref 134–144)

## 2019-05-21 LAB — CBC
Hematocrit: 27.8 % — ABNORMAL LOW (ref 37.5–51.0)
Hemoglobin: 9.1 g/dL — ABNORMAL LOW (ref 13.0–17.7)
MCH: 29 pg (ref 26.6–33.0)
MCHC: 32.7 g/dL (ref 31.5–35.7)
MCV: 89 fL (ref 79–97)
Platelets: 321 10*3/uL (ref 150–450)
RBC: 3.14 x10E6/uL — ABNORMAL LOW (ref 4.14–5.80)
RDW: 15.1 % (ref 11.6–15.4)
WBC: 13.1 10*3/uL — ABNORMAL HIGH (ref 3.4–10.8)

## 2019-05-21 MED ORDER — POTASSIUM CHLORIDE CRYS ER 20 MEQ PO TBCR: EXTENDED_RELEASE_TABLET | ORAL | 3 refills | Status: DC

## 2019-05-21 NOTE — Telephone Encounter (Signed)
-----   Message from Nelva Bush, MD sent at 05/21/2019  7:57 AM EDT ----- Please let Mr. Arboleda know that his kidney function is stable.  His potassium is slightly low, likely due to the increased dose of furosemide.  I recommend that we have him take potassium chloride 40 mEq x 1, followed by 20 mEq daily thereafter.  We should repeat a BMP in ~2 weeks.  No other medication changes at this time.  Hemoglobin has improved slightly at 9.1.  I recommend that Mr. Laker speak with Dr. Ginette Pitman or his gastroenterologist about utility of starting iron supplementation.

## 2019-05-21 NOTE — Telephone Encounter (Signed)
Results called to pt. Pt verbalized understanding. He is aware to take potassium 40 mEq x1 today when he get the prescription and then 20 mEq once day thereafter. He is aware to go to the [facility] in 2 weeks as well as to follow up with PCP or GI about iron supplementation. Rx sent to pharmacy.

## 2019-05-25 ENCOUNTER — Ambulatory Visit: Payer: Medicare Other | Admitting: Gastroenterology

## 2019-05-25 ENCOUNTER — Other Ambulatory Visit: Payer: Self-pay

## 2019-05-25 VITALS — BP 95/57 | HR 77 | Temp 98.1°F | Ht 67.0 in | Wt 153.4 lb

## 2019-05-25 DIAGNOSIS — K920 Hematemesis: Secondary | ICD-10-CM

## 2019-05-25 DIAGNOSIS — A09 Infectious gastroenteritis and colitis, unspecified: Secondary | ICD-10-CM

## 2019-05-25 DIAGNOSIS — D649 Anemia, unspecified: Secondary | ICD-10-CM

## 2019-05-25 NOTE — Progress Notes (Signed)
Bryan Bellows MD, MRCP(U.K) [address]  [place]  [place], [place] [address]  Main: [phone]  Fax: [phone]   Primary Care Physician: Bryan Harrier, MD  Primary Gastroenterologist:  Dr. Harland Brewer follow-up  HPI: Bryan Brewer is a 78 y.o. male   Summary of history :  He was admitted on 04/20/2019 with a GI bleed.  He has previously been seen by [facility] at [facility]  History of diagnosis of small bowel bacterial overgrowth syndrome, bladder cancer, sepsis related to UTI.  He presented to the ER on 04/20/2019 with rectal bleeding on one occasion.  His last colonoscopy was back in 2013.  On admission his hemoglobin was 5.5 g with a BUN of 111 and a creatinine of 2.96.  1 episode of hematemesis prior to admission and subsequently had dark tarry stools.  He denies any NSAID use.  I performed an EGD on 04/21/2019, noted Dula Foy lesion which was injected with epinephrine and clipped.  During the procedure he developed episode of V. tach probably related to adrenaline that was injected into the dual for lesion.  Hence the procedure was terminated, the next day I placed clips over the data for lesion.  On oral vancomycin for C. difficile diarrhea.  Interval history  05/01/2019-05/25/2019  05/20/2019: Hemoglobin 9.1  Denies any melena.  Denies any abdominal pain except when he is having a bowel movement.  His diarrhea had resolved but has recurred over the last few days has up to 10 bowel movements a day nonbloody with some mucus in it.  Denies any fever.  Denies any NSAID use.  Taking PPI.  Current Outpatient Medications  Medication Sig Dispense Refill  . aspirin EC 81 MG tablet Take 81 mg by mouth every evening.     Marland Kitchen atorvastatin (LIPITOR) 80 MG tablet Take 80 mg by mouth at bedtime.    . blood glucose meter kit and supplies KIT Dispense based on patient and insurance preference. Use up to four times daily as directed. (FOR ICD-9 250.00, 250.01). 1 each 1  .  cephALEXin (KEFLEX) 500 MG capsule Take 500 mg by mouth daily.    . furosemide (LASIX) 80 MG tablet Take 1 tablet (80 mg total) by mouth daily. 30 tablet 3  . glipiZIDE (GLUCOTROL) 5 MG tablet Take 0.5 tablets (2.5 mg total) by mouth daily before breakfast. 15 tablet 1  . ipratropium-albuterol (DUONEB) 0.5-2.5 (3) MG/3ML SOLN Take 3 mLs by nebulization every 6 (six) hours as needed (wheezing, shortness of breath). 360 mL 1  . metoprolol tartrate (LOPRESSOR) 50 MG tablet Take 1 tablet (50 mg total) by mouth 2 (two) times daily. 60 tablet 0  . mometasone-formoterol (DULERA) 200-5 MCG/ACT AERO Inhale 2 puffs into the lungs 2 (two) times daily. 1 g 2  . pantoprazole (PROTONIX) 40 MG tablet Take 1 tablet (40 mg total) by mouth daily. 30 tablet 11  . potassium chloride SA (KLOR-CON) 20 MEQ tablet First dose 40 mEq (2 tablets) by mouth for one dose, then take 20 mEq (1 tablet) by mouth once a day thereafter. 90 tablet 3  . sodium bicarbonate 650 MG tablet Take 1,300 mg by mouth 3 (three) times daily.     No current facility-administered medications for this visit.     Allergies as of 05/25/2019  . (No Known Allergies)    ROS:  General: Negative for anorexia, weight loss, fever, chills, fatigue, weakness. ENT: Negative for hoarseness, difficulty swallowing , nasal congestion.  CV: Negative for chest pain, angina, palpitations, dyspnea on exertion, peripheral edema.  Respiratory: Negative for dyspnea at rest, dyspnea on exertion, cough, sputum, wheezing.  GI: See history of present illness. GU:  Negative for dysuria, hematuria, urinary incontinence, urinary frequency, nocturnal urination.  Endo: Negative for unusual weight change.    Physical Examination:   There were no vitals taken for this visit.  General: Well-nourished, well-developed in no acute distress.  Eyes: No icterus. Conjunctivae pink. Mouth: Oropharyngeal mucosa moist and pink , no lesions erythema or exudate. Lungs: Clear to  auscultation bilaterally. Non-labored. Heart: Regular rate and rhythm, no murmurs rubs or gallops.  Abdomen: Bowel sounds are normal, nontender, nondistended, no hepatosplenomegaly or masses, no abdominal bruits or hernia , no rebound or guarding.   Extremities: No lower extremity edema. No clubbing or deformities. Neuro: Alert and oriented x 3.  Grossly intact. Skin: Warm and dry, no jaundice.   Psych: Alert and cooperative, normal mood and affect.   Imaging Studies: Dg Chest Portable 1 View  Result Date: 04/29/2019 CLINICAL DATA:  Altered mental status EXAM: PORTABLE CHEST 1 VIEW COMPARISON:  04/20/2019 FINDINGS: There is bilateral chronic interstitial lung disease. There is no focal consolidation. There is no pleural effusion or pneumothorax. The heart and mediastinal contours are unremarkable. There is evidence of prior CABG. There is no acute osseous abnormality. IMPRESSION: No active disease. Electronically Signed   By: Kathreen Devoid   On: 04/29/2019 12:09    Assessment and Plan:   Bryan Brewer is a 78 y.o. y/o male was recently admitted to the hospital with a hemoglobin of 5.5 g with hematemesis.  EGD demonstrated a Dula Foy lesion of the stomach which was injected and subsequently clipped.  Hemoglobin has been stable since discharge but not yet normalized.  No recent iron studies have been performed.  Patient also complicated by C. difficile diarrhea treated with a course of  vancomycin  Plan 1.  Check iron studies, B12: I suggest the PCP to repeat his hemoglobin under about 3 to 4 weeks to ensure that it is rising appropriately. 2.  Continue PPI 3.  Recheck stool for GI PCR and C. difficile recurrence 4.  H. pylori breath test  Dr Bryan Bellows  MD,MRCP [facility]) Follow up in 10 to 14 days telephone visit

## 2019-05-26 DIAGNOSIS — R002 Palpitations: Secondary | ICD-10-CM

## 2019-05-26 LAB — IRON,TIBC AND FERRITIN PANEL
Ferritin: 231 ng/mL (ref 30–400)
Iron Saturation: 11 % — ABNORMAL LOW (ref 15–55)
Iron: 33 ug/dL — ABNORMAL LOW (ref 38–169)
Total Iron Binding Capacity: 308 ug/dL (ref 250–450)
UIBC: 275 ug/dL (ref 111–343)

## 2019-05-26 LAB — B12 AND FOLATE PANEL
Folate: 14.9 ng/mL (ref >3.0)
Vitamin B-12: 478 pg/mL (ref 232–1245)

## 2019-05-26 LAB — H. PYLORI BREATH TEST: H pylori Breath Test: NEGATIVE

## 2019-05-26 NOTE — Progress Notes (Signed)
Bryan Brewer Inform his iron studies do not look terribly low and his ferritin is actually in the normal range.  I am not really sure if this is iron deficiency or something else: It appears that his anemia is disproportionate to what his iron studies appear to be and a 1 month back his iron levels were normal.  I would suggest that we refer him to see Dr. Tasia Catchings to determine if any other further work-up needs to be done for his anemia.  Dr Vicente Males

## 2019-05-27 ENCOUNTER — Telehealth: Payer: Self-pay

## 2019-05-27 NOTE — Telephone Encounter (Signed)
-----   Message from Earlie Server, MD sent at 05/26/2019 12:33 PM EDT ----- Thank you Dr.Anna!   Bryan Brewer, please arrange patient to establish care with me- [facility] 31min. Thanks.  Zhou ----- Message ----- From: Jonathon Bellows, MD Sent: 05/26/2019  12:19 PM EDT To: Earlie Server, MD, Rushie Chestnut, CMA  Sherald Hess Inform his iron studies do not look terribly low and his ferritin is actually in the normal range.  I am not really sure if this is iron deficiency or something else: It appears that his anemia is disproportionate to what his iron studies app ear to be and a 1 month back his iron levels were normal.  I would suggest that we refer him to see Dr. Tasia Catchings to determine if any other further work-up needs to be done for his anemia.  Dr Vicente Males

## 2019-05-27 NOTE — Telephone Encounter (Signed)
Called pt to inform him of lab results and Dr. Georgeann Oppenheim recommendation.  Unable to contact, LVM to return call

## 2019-05-29 LAB — GI PROFILE, STOOL, PCR

## 2019-05-30 NOTE — Progress Notes (Signed)
Inform stool positive for c diff if still having diarrhea needs dificid for 10 days. Follow up in 2 weekS

## 2019-05-31 LAB — C DIFFICILE, CYTOTOXIN B

## 2019-05-31 LAB — C DIFFICILE TOXINS A+B W/RFLX: C difficile Toxins A+B, EIA: NEGATIVE

## 2019-06-01 ENCOUNTER — Other Ambulatory Visit: Payer: Self-pay

## 2019-06-02 ENCOUNTER — Telehealth: Payer: Self-pay

## 2019-06-02 ENCOUNTER — Other Ambulatory Visit: Payer: Self-pay

## 2019-06-02 ENCOUNTER — Inpatient Hospital Stay: Payer: Medicare Other

## 2019-06-02 ENCOUNTER — Inpatient Hospital Stay: Payer: Medicare Other | Attending: Oncology | Admitting: Oncology

## 2019-06-02 ENCOUNTER — Encounter: Payer: Self-pay | Admitting: Oncology

## 2019-06-02 ENCOUNTER — Telehealth: Payer: Self-pay | Admitting: *Deleted

## 2019-06-02 VITALS — BP 85/41 | HR 62 | Temp 98.0°F | Ht 67.0 in | Wt 149.0 lb

## 2019-06-02 DIAGNOSIS — I10 Essential (primary) hypertension: Secondary | ICD-10-CM | POA: Insufficient documentation

## 2019-06-02 DIAGNOSIS — A0472 Enterocolitis due to Clostridium difficile, not specified as recurrent: Secondary | ICD-10-CM | POA: Diagnosis not present

## 2019-06-02 DIAGNOSIS — J449 Chronic obstructive pulmonary disease, unspecified: Secondary | ICD-10-CM | POA: Diagnosis not present

## 2019-06-02 DIAGNOSIS — N189 Chronic kidney disease, unspecified: Secondary | ICD-10-CM

## 2019-06-02 DIAGNOSIS — Z87891 Personal history of nicotine dependence: Secondary | ICD-10-CM | POA: Diagnosis not present

## 2019-06-02 DIAGNOSIS — C679 Malignant neoplasm of bladder, unspecified: Secondary | ICD-10-CM | POA: Insufficient documentation

## 2019-06-02 DIAGNOSIS — D631 Anemia in chronic kidney disease: Secondary | ICD-10-CM

## 2019-06-02 DIAGNOSIS — Z803 Family history of malignant neoplasm of breast: Secondary | ICD-10-CM | POA: Insufficient documentation

## 2019-06-02 DIAGNOSIS — E119 Type 2 diabetes mellitus without complications: Secondary | ICD-10-CM | POA: Insufficient documentation

## 2019-06-02 DIAGNOSIS — N400 Enlarged prostate without lower urinary tract symptoms: Secondary | ICD-10-CM | POA: Insufficient documentation

## 2019-06-02 DIAGNOSIS — D649 Anemia, unspecified: Secondary | ICD-10-CM | POA: Diagnosis not present

## 2019-06-02 DIAGNOSIS — Z8551 Personal history of malignant neoplasm of bladder: Secondary | ICD-10-CM | POA: Diagnosis not present

## 2019-06-02 LAB — COMPREHENSIVE METABOLIC PANEL
ALT: 12 U/L (ref 0–44)
AST: 17 U/L (ref 15–41)
Albumin: 2.8 g/dL — ABNORMAL LOW (ref 3.5–5.0)
Alkaline Phosphatase: 52 U/L (ref 38–126)
Anion gap: 10 (ref 5–15)
BUN: 55 mg/dL — ABNORMAL HIGH (ref 8–23)
CO2: 24 mmol/L (ref 22–32)
Calcium: 8.3 mg/dL — ABNORMAL LOW (ref 8.9–10.3)
Chloride: 104 mmol/L (ref 98–111)
Creatinine, Ser: 3.28 mg/dL — ABNORMAL HIGH (ref 0.61–1.24)
GFR calc Af Amer: 20 mL/min — ABNORMAL LOW (ref >60)
GFR calc non Af Amer: 17 mL/min — ABNORMAL LOW (ref >60)
Glucose, Bld: 181 mg/dL — ABNORMAL HIGH (ref 70–99)
Potassium: 4.1 mmol/L (ref 3.5–5.1)
Sodium: 138 mmol/L (ref 135–145)
Total Bilirubin: 0.6 mg/dL (ref 0.3–1.2)
Total Protein: 6.9 g/dL (ref 6.5–8.1)

## 2019-06-02 LAB — CBC WITH DIFFERENTIAL/PLATELET
Abs Immature Granulocytes: 0.11 10*3/uL — ABNORMAL HIGH (ref 0.00–0.07)
Basophils Absolute: 0.1 10*3/uL (ref 0.0–0.1)
Basophils Relative: 1 %
Eosinophils Absolute: 0 10*3/uL (ref 0.0–0.5)
Eosinophils Relative: 0 %
HCT: 28.8 % — ABNORMAL LOW (ref 39.0–52.0)
Hemoglobin: 8.8 g/dL — ABNORMAL LOW (ref 13.0–17.0)
Immature Granulocytes: 1 %
Lymphocytes Relative: 12 %
Lymphs Abs: 1.4 10*3/uL (ref 0.7–4.0)
MCH: 27.6 pg (ref 26.0–34.0)
MCHC: 30.6 g/dL (ref 30.0–36.0)
MCV: 90.3 fL (ref 80.0–100.0)
Monocytes Absolute: 0.8 10*3/uL (ref 0.1–1.0)
Monocytes Relative: 7 %
Neutro Abs: 9.5 10*3/uL — ABNORMAL HIGH (ref 1.7–7.7)
Neutrophils Relative %: 79 %
Platelets: 340 10*3/uL (ref 150–400)
RBC: 3.19 MIL/uL — ABNORMAL LOW (ref 4.22–5.81)
RDW: 15.9 % — ABNORMAL HIGH (ref 11.5–15.5)
WBC: 11.9 10*3/uL — ABNORMAL HIGH (ref 4.0–10.5)
nRBC: 0 % (ref 0.0–0.2)

## 2019-06-02 LAB — RETICULOCYTES
Immature Retic Fract: 19.1 % — ABNORMAL HIGH (ref 2.3–15.9)
RBC.: 3.19 MIL/uL — ABNORMAL LOW (ref 4.22–5.81)
Retic Count, Absolute: 49.1 10*3/uL (ref 19.0–186.0)
Retic Ct Pct: 1.5 % (ref 0.4–3.1)

## 2019-06-02 LAB — TSH: TSH: 1.715 u[IU]/mL (ref 0.350–4.500)

## 2019-06-02 NOTE — Telephone Encounter (Signed)
-----   Message from Jonathon Bellows, MD sent at 05/30/2019  6:32 PM EDT ----- Inform stool positive for c diff if still having diarrhea needs dificid for 10 days. Follow up in 2 weekS

## 2019-06-02 NOTE — Telephone Encounter (Signed)
Got a call back from [name] and she took medications that pt is currently on  Metoprolol 50 mg bid, lasix 80 mg daily. His b/p 85/41. Due to his b/p being low and the fact that he has c diff and on atb and cont. To have diarrhea. Dr. Janese Banks feels like the meds need to be adjusted. Bryan Brewer will get with dr Ginette Pitman and make changes and call pt or wife and let them know

## 2019-06-02 NOTE — Telephone Encounter (Signed)
Called PCP office-Dr. Ginette Pitman. Asked for nurse to call me back about pt's low b/p and he is b/p pill bid and lasix 80 mg. Concerned with maybe changes to help b/p. He also has c diff.

## 2019-06-02 NOTE — Telephone Encounter (Signed)
Spoke with pt wife and informed her of pt stool test results and Dr. Georgeann Oppenheim plan for treatment. Pt's wife agrees, I explained that we will be sending the Dificid prescription to a specialty pharmacy so that pt receives medication at a much lower cost. I explained that the pharmacy will contact pt via phone before mailing pt the medication. She verbalized understanding and agrees.

## 2019-06-03 LAB — KAPPA/LAMBDA LIGHT CHAINS
Kappa free light chain: 206.8 mg/L — ABNORMAL HIGH (ref 3.3–19.4)
Kappa, lambda light chain ratio: 1.27 (ref 0.26–1.65)
Lambda free light chains: 163.2 mg/L — ABNORMAL HIGH (ref 5.7–26.3)

## 2019-06-04 LAB — MULTIPLE MYELOMA PANEL, SERUM
Albumin SerPl Elph-Mcnc: 2.7 g/dL — ABNORMAL LOW (ref 2.9–4.4)
Albumin/Glob SerPl: 0.9 (ref 0.7–1.7)
Alpha 1: 0.3 g/dL (ref 0.0–0.4)
Alpha2 Glob SerPl Elph-Mcnc: 0.9 g/dL (ref 0.4–1.0)
B-Globulin SerPl Elph-Mcnc: 0.8 g/dL (ref 0.7–1.3)
Gamma Glob SerPl Elph-Mcnc: 1.3 g/dL (ref 0.4–1.8)
Globulin, Total: 3.3 g/dL (ref 2.2–3.9)
IgA: 496 mg/dL — ABNORMAL HIGH (ref 61–437)
IgG (Immunoglobin G), Serum: 1401 mg/dL (ref 603–1613)
IgM (Immunoglobulin M), Srm: 45 mg/dL (ref 15–143)
Total Protein ELP: 6 g/dL (ref 6.0–8.5)

## 2019-06-04 LAB — HAPTOGLOBIN: Haptoglobin: 349 mg/dL (ref 34–355)

## 2019-06-07 ENCOUNTER — Encounter: Payer: Self-pay | Admitting: Oncology

## 2019-06-07 NOTE — Progress Notes (Signed)
Hematology/Oncology Consult note [facility] Telephone:([phone] [phone]  Patient Care Team: Tracie Harrier, MD as PCP - General (Internal Medicine) End, Harrell Gave, MD as PCP - Cardiology (Cardiology)   Name of the patient: Bryan Brewer  [id number]  1941-02-26    Reason for referral- anemia   Referring physician- Dr. Vicente Males  Date of visit: 06/07/19   History of presenting illness- Patient is a 78 year old male with past medical history significant for bladder cancer, small bowel bacterial overgrowth syndrome and recent episode of C. difficile diarrhea for which he had seen Dr. Vicente Males.  He was on oral vancomycin but had his first recurrence and was supposed to take Dificid which she has not started taking yet.  He has been referred to [place] for anemia.  Most recent CBC from 05/20/2019 showed white count of 13.1, H&H of 9.1/27.8 with an MCV of 89 and a platelet count of 321.  Iron study showed a normal ferritin of 231 with a low iron saturation of 11% and TIBC of 308.  B12 and folate were normal.  Of note patient has had chronic microcytic anemia over the last 1-1/2-year hemoglobin is fluctuated between 8-9.   Patient currently reports ongoing diarrhea 3-4 watery bowel movements per day. He feels fatigued. Denies any falls  ECOG PS- 2  Pain scale- 0   Review of systems- Review of Systems  Constitutional: Positive for malaise/fatigue. Negative for chills, fever and weight loss.  HENT: Negative for congestion, ear discharge and nosebleeds.   Eyes: Negative for blurred vision.  Respiratory: Negative for cough, hemoptysis, sputum production, shortness of breath and wheezing.   Cardiovascular: Negative for chest pain, palpitations, orthopnea and claudication.  Gastrointestinal: Positive for diarrhea. Negative for abdominal pain, blood in stool, constipation, heartburn, melena, nausea and vomiting.  Genitourinary: Negative for dysuria, flank pain,  frequency, hematuria and urgency.  Musculoskeletal: Negative for back pain, joint pain and myalgias.  Skin: Negative for rash.  Neurological: Negative for dizziness, tingling, focal weakness, seizures, weakness and headaches.  Endo/Heme/Allergies: Does not bruise/bleed easily.  Psychiatric/Behavioral: Negative for depression and suicidal ideas. The patient does not have insomnia.     No Known Allergies  Patient Active Problem List   Diagnosis Date Noted  . Bladder cancer ([place]) 06/02/2019  . BPH (benign prostatic hyperplasia) 06/02/2019  . Anemia 05/21/2019  . Acute on chronic heart failure with preserved ejection fraction (HFpEF) ([place]) 05/14/2019  . CAD in native artery 05/14/2019  . Cellulitis of lower extremity 05/14/2019  . Skin ulcer of calf, limited to breakdown of skin ([place]) 05/14/2019  . Hypoglycemia 04/29/2019  . Hypotension 04/29/2019  . Tachycardia   . GIB (gastrointestinal bleeding) 04/20/2019  . Pressure injury of skin 03/31/2019  . HCAP (healthcare-associated pneumonia) 03/29/2019  . UTI (urinary tract infection) 03/29/2019  . Severe sepsis with septic shock ([place]) 03/29/2019  . Hyponatremia 03/29/2019  . Acute on chronic renal failure ([place]) 02/14/2019  . Osteoarthritis of knee 01/06/2019  . Chronic systolic CHF (congestive heart failure) ([place]) 11/20/2018  . Primary osteoarthritis involving multiple joints 08/02/2018  . Presence of aortocoronary bypass graft 11/08/2017  . NSTEMI (non-ST elevated myocardial infarction) ([place]) 10/30/2017  . Diabetes ([place]) 10/30/2017  . CKD (chronic kidney disease), stage IV ([place]) 10/30/2017  . HTN (hypertension) 10/30/2017  . HLD (hyperlipidemia) 10/30/2017  . COPD (chronic obstructive pulmonary disease) ([place]) 10/30/2017  . Spinal stenosis of lumbar region 08/20/2017  . Lumbar radiculopathy 08/09/2017  . Hx of bladder cancer 04/30/2016  . Pure  hypercholesterolemia 09/08/2015  . Simple chronic bronchitis ([place]) 09/08/2015  .  Hypomagnesemia 03/30/2015  . Abdominal wall hernia at previous stoma site 01/12/2013  . Diarrhea in adult patient 01/12/2013     Past Medical History:  Diagnosis Date  . Bladder cancer ([place])   . COPD (chronic obstructive pulmonary disease) ([place])   . Coronary artery disease   . Hyperlipidemia   . Hypertension   . Kidney disease   . Type 2 diabetes mellitus ([place])      Past Surgical History:  Procedure Laterality Date  . COLONOSCOPY    . CORONARY ARTERY BYPASS GRAFT  2019   [facility]; LIMA-LAD, SVG-diagonal, SVG-OM1, and SVG-OM2  . ESOPHAGOGASTRODUODENOSCOPY N/A 04/23/2019   Procedure: ESOPHAGOGASTRODUODENOSCOPY (EGD);  Surgeon: Jonathon Bellows, MD;  Location[facility] ENDOSCOPY;  Service: Gastroenterology;  Laterality: N/A;  . ESOPHAGOGASTRODUODENOSCOPY (EGD) WITH PROPOFOL N/A 04/21/2019   Procedure: ESOPHAGOGASTRODUODENOSCOPY (EGD) WITH PROPOFOL;  Surgeon: Jonathon Bellows, MD;  Location: [facility] ENDOSCOPY;  Service: Gastroenterology;  Laterality: N/A;  . LEFT HEART CATH AND CORONARY ANGIOGRAPHY N/A 10/31/2017   Procedure: LEFT HEART CATH AND CORONARY ANGIOGRAPHY;  Surgeon: Yolonda Kida, MD;  Location: [place] CV LAB;  Service: Cardiovascular;  Laterality: N/A;  . REVISION UROSTOMY CUTANEOUS      Social History   Socioeconomic History  . Marital status: Married    Spouse name: Not on file  . Number of children: Not on file  . Years of education: Not on file  . Highest education level: Not on file  Occupational History  . Not on file  Social Needs  . Financial resource strain: Not hard at all  . Food insecurity    Worry: Patient refused    Inability: Patient refused  . Transportation needs    Medical: Patient refused    Non-medical: Patient refused  Tobacco Use  . Smoking status: Former Smoker    Packs/day: 1.50    Years: 35.00    Pack years: 52.50    Types: Cigarettes    Quit date: 2013    Years since quitting: 7.8  . Smokeless tobacco: Never Used  Substance and Sexual  Activity  . Alcohol use: No    Frequency: Never  . Drug use: Never  . Sexual activity: Not Currently  Lifestyle  . Physical activity    Days per week: Patient refused    Minutes per session: Patient refused  . Stress: Patient refused  Relationships  . Social Herbalist on phone: Patient refused    Gets together: Patient refused    Attends religious service: Patient refused    Active member of club or organization: Patient refused    Attends meetings of clubs or organizations: Patient refused    Relationship status: Patient refused  . Intimate partner violence    Fear of current or ex partner: Patient refused    Emotionally abused: Patient refused    Physically abused: Patient refused    Forced sexual activity: Patient refused  Other Topics Concern  . Not on file  Social History Narrative  . Not on file     Family History  Problem Relation Age of Onset  . CAD Mother   . Stroke Mother   . CAD Father   . Stroke Father   . Breast cancer Sister      Current Outpatient Medications:  .  aspirin EC 81 MG tablet, Take 81 mg by mouth every evening. , Disp: , Rfl:  .  atorvastatin (LIPITOR) 80 MG  tablet, Take 80 mg by mouth at bedtime., Disp: , Rfl:  .  blood glucose meter kit and supplies KIT, Dispense based on patient and insurance preference. Use up to four times daily as directed. (FOR ICD-9 250.00, 250.01)., Disp: 1 each, Rfl: 1 .  furosemide (LASIX) 80 MG tablet, Take 1 tablet (80 mg total) by mouth daily., Disp: 30 tablet, Rfl: 3 .  glipiZIDE (GLUCOTROL) 5 MG tablet, Take 0.5 tablets (2.5 mg total) by mouth daily before breakfast., Disp: 15 tablet, Rfl: 1 .  ipratropium-albuterol (DUONEB) 0.5-2.5 (3) MG/3ML SOLN, Take 3 mLs by nebulization every 6 (six) hours as needed (wheezing, shortness of breath)., Disp: 360 mL, Rfl: 1 .  metoprolol tartrate (LOPRESSOR) 50 MG tablet, Take 1 tablet (50 mg total) by mouth 2 (two) times daily., Disp: 60 tablet, Rfl: 0 .   mometasone-formoterol (DULERA) 200-5 MCG/ACT AERO, Inhale 2 puffs into the lungs 2 (two) times daily., Disp: 1 g, Rfl: 2 .  pantoprazole (PROTONIX) 40 MG tablet, Take 1 tablet (40 mg total) by mouth daily., Disp: 30 tablet, Rfl: 11 .  potassium chloride SA (KLOR-CON) 20 MEQ tablet, First dose 40 mEq (2 tablets) by mouth for one dose, then take 20 mEq (1 tablet) by mouth once a day thereafter., Disp: 90 tablet, Rfl: 3 .  sodium bicarbonate 650 MG tablet, Take 1,300 mg by mouth 3 (three) times daily., Disp: , Rfl:    Physical exam:  Vitals:   06/02/19 1345  BP: (!) 85/41  Pulse: 62  Temp: 98 F (36.7 C)  TempSrc: Tympanic  Weight: 149 lb (67.6 kg)  Height: 5' 7" (1.702 m)   Physical Exam Constitutional:      Comments: Elderly frail man sitting in a wheelchair. Appears in no acute distress  HENT:     Head: Normocephalic and atraumatic.  Eyes:     Pupils: Pupils are equal, round, and reactive to light.  Neck:     Musculoskeletal: Normal range of motion.  Cardiovascular:     Rate and Rhythm: Normal rate and regular rhythm.     Heart sounds: Normal heart sounds.  Pulmonary:     Effort: Pulmonary effort is normal.     Breath sounds: Normal breath sounds.  Abdominal:     General: Bowel sounds are normal.     Palpations: Abdomen is soft.  Skin:    General: Skin is warm and dry.  Neurological:     Mental Status: He is alert and oriented to person, place, and time.        CBC Latest Ref Rng & Units 06/02/2019  WBC 4.0 - 10.5 K/uL 11.9(H)  Hemoglobin 13.0 - 17.0 g/dL 8.8(L)  Hematocrit 39.0 - 52.0 % 28.8(L)  Platelets 150 - 400 K/uL 340    No images are attached to the encounter.  No results found.  Assessment and plan- Patient is a 78 y.o. male referred for normocytic anemia  Patient still has ongoing symptoms of c diff and has not started taking dificid for c diff recurrence. I did touch base with Dr. Vicente Males regarding this to follow up. He is also hypotensive today and on  lasix and metoprolol. We will touch base with DR. Hande regarding this  Anemia-: likely multifactorial due to iron deficiency (ferritin can be falsely normal in acute inflammation) and CKD I will do further anemia work up today  With tsh, myeloma panel, serum free light chains, haptoglobin. Before giving him epo it may be worthwhile to give him a  trial of iv iron first. We will look into his insurance and arrange for IV iron. Discussed risks and benefits of IV iron including all but not limited to headache, nausea and possible infusion reaction. Patient undersnds and agrees to proceed. His c diff needs to get better first before he can come forinfusion   Thank you for this kind referral and the opportunity to participate in the care of this  Patient   Visit Diagnosis 1. Normocytic anemia   2. C. difficile colitis     Dr. Randa Evens, MD, MPH [facility] at [facility] [phone] 06/07/2019

## 2019-06-08 ENCOUNTER — Other Ambulatory Visit: Payer: Self-pay

## 2019-06-09 ENCOUNTER — Ambulatory Visit: Payer: Medicare Other | Admitting: Gastroenterology

## 2019-06-09 ENCOUNTER — Other Ambulatory Visit: Payer: Self-pay

## 2019-06-09 MED ORDER — FIDAXOMICIN 200 MG PO TABS: 200.0000 mg | ORAL_TABLET | Freq: Two times a day (BID) | ORAL | 0 refills | Status: DC

## 2019-06-10 ENCOUNTER — Telehealth: Payer: Self-pay

## 2019-06-10 ENCOUNTER — Other Ambulatory Visit: Payer: Self-pay

## 2019-06-10 MED ORDER — VANCOMYCIN HCL 125 MG PO CAPS: 125.0000 mg | ORAL_CAPSULE | Freq: Four times a day (QID) | ORAL | 0 refills | Status: DC

## 2019-06-10 NOTE — Telephone Encounter (Signed)
Spoke with pt and informed him that he did not qualify for the pt assistance program with the specialty pharmacy to help with the copay cost of the Dificid. Pt understands that he did not qualify due to having Medicare Part D drug insurance. I explained to pt that Dr. Vicente Males recommends a 4 week coarse of Vancomycin and also a follow up visit in 2 weeks to discuss lowering the dose of the vancomycin. Pt understands and agrees. Pt has been scheduled and prescription has been sent.

## 2019-06-15 DIAGNOSIS — I480 Paroxysmal atrial fibrillation: Secondary | ICD-10-CM | POA: Insufficient documentation

## 2019-06-16 DIAGNOSIS — I700 Atherosclerosis of aorta: Secondary | ICD-10-CM | POA: Insufficient documentation

## 2019-06-18 ENCOUNTER — Telehealth: Payer: Self-pay | Admitting: *Deleted

## 2019-06-18 NOTE — Telephone Encounter (Signed)
Called pt's home and it is full of messages and can't leave a message. I called the cell phone and it rings and rings and then disconnects.

## 2019-06-24 ENCOUNTER — Ambulatory Visit (INDEPENDENT_AMBULATORY_CARE_PROVIDER_SITE_OTHER): Payer: Medicare Other | Admitting: Gastroenterology

## 2019-06-24 DIAGNOSIS — A0472 Enterocolitis due to Clostridium difficile, not specified as recurrent: Secondary | ICD-10-CM | POA: Diagnosis not present

## 2019-06-24 NOTE — Progress Notes (Signed)
Bryan Brewer , MD [address]  [place]  [place], [place] [address]  Main: [phone]  Fax: [phone]   Primary Care Physician: Tracie Harrier, MD  Virtual Visit via Telephone Note  I connected with patient on 06/24/19 at 10:15 AM EST by telephone and verified that I am speaking with the correct person using two identifiers.   I discussed the limitations, risks, security and privacy concerns of performing an evaluation and management service by telephone and the availability of in person appointments. I also discussed with the patient that there may be a patient responsible charge related to this service. The patient expressed understanding and agreed to proceed.  Location of Patient: Home Location of Provider: Home Persons involved: Patient and provider only   History of Present Illness:   Diarrhea follow-up  HPI: Bryan Brewer is a 78 y.o. male   Summary of history :  He was admitted on 04/20/2019 with a GI bleed.  He has previously been seen by [facility] at [facility].  History of diagnosis of small bowel bacterial overgrowth syndrome, bladder cancer, sepsis related to UTI.  He presented to the ER on 04/20/2019 with rectal bleeding, hematemesis, melena.On admission his hemoglobin was 5.5 g with a BUN of 111 and a creatinine of 2.96.   EGD on 04/21/2019, noted DuleFoy lesion which was injected with epinephrine and clipped.  During the procedure he developed episode of V. tach probably related to adrenaline that was injected into the dual for lesion.  Hence the procedure was terminated, the next day I placed clips over the data for lesion.  Treated for C. difficile diarrhea with vancomycin but recurred after he went off the antibiotics after completing the course.  I had referred him to see hematology for anemia despite normalization of his iron studies.  The cancer center tried contacting him on multiple occasions but he was not too keen on making an appointment.   Interval history  05/25/2019-06/24/2019  05/25/2019: H. pylori breath test negative, ferritin of 231  05/26/19 20: Stool positive for C. difficile toxin , intended to commence him on Dificid but his insurance did not cover it and hence had to be started on vancomycin slow taper.  Having 3 bowel movements a day.  Fewer than what he had previously.  Not yet fully formed but much better than what it was 2 weeks back.  He is 2 weeks into his 4-week course of vancomycin.    Current Outpatient Medications  Medication Sig Dispense Refill  . aspirin EC 81 MG tablet Take 81 mg by mouth every evening.     Marland Kitchen atorvastatin (LIPITOR) 80 MG tablet Take 80 mg by mouth at bedtime.    . blood glucose meter kit and supplies KIT Dispense based on patient and insurance preference. Use up to four times daily as directed. (FOR ICD-9 250.00, 250.01). 1 each 1  . furosemide (LASIX) 80 MG tablet Take 1 tablet (80 mg total) by mouth daily. 30 tablet 3  . glipiZIDE (GLUCOTROL) 5 MG tablet Take 0.5 tablets (2.5 mg total) by mouth daily before breakfast. 15 tablet 1  . ipratropium-albuterol (DUONEB) 0.5-2.5 (3) MG/3ML SOLN Take 3 mLs by nebulization every 6 (six) hours as needed (wheezing, shortness of breath). 360 mL 1  . metoprolol tartrate (LOPRESSOR) 50 MG tablet Take 1 tablet (50 mg total) by mouth 2 (two) times daily. 60 tablet 0  . mometasone-formoterol (DULERA) 200-5 MCG/ACT AERO Inhale 2 puffs into the lungs 2 (  two) times daily. 1 g 2  . pantoprazole (PROTONIX) 40 MG tablet Take 1 tablet (40 mg total) by mouth daily. 30 tablet 11  . potassium chloride SA (KLOR-CON) 20 MEQ tablet First dose 40 mEq (2 tablets) by mouth for one dose, then take 20 mEq (1 tablet) by mouth once a day thereafter. 90 tablet 3  . sodium bicarbonate 650 MG tablet Take 1,300 mg by mouth 3 (three) times daily.    . vancomycin (VANCOCIN) 125 MG capsule Take 1 capsule (125 mg total) by mouth 4 (four) times daily for 28 days. 112 capsule 0    No current facility-administered medications for this visit.     Allergies as of 06/24/2019  . (No Known Allergies)    Review of Systems:    All systems reviewed and negative except where noted in HPI.   Observations/Objective:  Labs: CMP     Component Value Date/Time   NA 138 06/02/2019 1414   NA 142 05/20/2019 1209   NA 139 04/21/2012 1239   K 4.1 06/02/2019 1414   K 4.8 04/21/2012 1239   CL 104 06/02/2019 1414   CL 105 04/21/2012 1239   CO2 24 06/02/2019 1414   CO2 28 04/21/2012 1239   GLUCOSE 181 (H) 06/02/2019 1414   GLUCOSE 122 (H) 04/21/2012 1239   BUN 55 (H) 06/02/2019 1414   BUN 41 (H) 05/20/2019 1209   BUN 15 04/21/2012 1239   CREATININE 3.28 (H) 06/02/2019 1414   CREATININE 1.27 04/21/2012 1239   CALCIUM 8.3 (L) 06/02/2019 1414   CALCIUM 9.3 04/21/2012 1239   PROT 6.9 06/02/2019 1414   PROT 7.4 04/21/2012 1239   ALBUMIN 2.8 (L) 06/02/2019 1414   ALBUMIN 3.4 04/21/2012 1239   AST 17 06/02/2019 1414   AST 14 (L) 04/21/2012 1239   ALT 12 06/02/2019 1414   ALT 16 04/21/2012 1239   ALKPHOS 52 06/02/2019 1414   ALKPHOS 79 04/21/2012 1239   BILITOT 0.6 06/02/2019 1414   BILITOT 0.4 04/21/2012 1239   GFRNONAA 17 (L) 06/02/2019 1414   GFRNONAA 57 (L) 04/21/2012 1239   GFRAA 20 (L) 06/02/2019 1414   GFRAA >60 04/21/2012 1239   Lab Results  Component Value Date   WBC 11.9 (H) 06/02/2019   HGB 8.8 (L) 06/02/2019   HCT 28.8 (L) 06/02/2019   MCV 90.3 06/02/2019   PLT 340 06/02/2019    Imaging Studies: No results found.  Assessment and Plan:   Bryan Brewer is a 78 y.o.  y/o male here to follow-up for first recurrence of C. difficile diarrhea on a tapering dose of vancomycin as his insurance did not cover for Dificid.  Admit to the hospital in 04/26/2019 from an upper GI bleed secondary to a Dieulefoy  lesion of the stomach which was injected and clipped.  First recurrence of C. difficile diarrhea and on a slow taper of vancomycin.  Stool frequency and  consistency have improved but not resolved completely.   Plan 1.    Close monitoring of hemoglobin by Dr. Ginette Pitman , I have offered him to be seen at hematology to evaluate for his anemia but he has not been interested in setting up the appointment. 2.  Complete 4 weeks of vancomycin then decrease to 3 doses a day for 2 weeks then 2 doses for 1 week and will taper gradually from thereon.  Follow-up in 2 weeks telephone call   I discussed the assessment and treatment plan with the patient. The patient was provided  an opportunity to ask questions and all were answered. The patient agreed with the plan and demonstrated an understanding of the instructions.   The patient was advised to call back or seek an in-person evaluation if the symptoms worsen or if the condition fails to improve as anticipated.  I provided 11 minutes of non-face-to-face time during this encounter.  Dr Bryan Bellows MD,MRCP [facility]) Gastroenterology/Hepatology Pager: [phone]   Speech recognition software was used to dictate this note.

## 2019-06-25 ENCOUNTER — Telehealth: Payer: Self-pay

## 2019-06-25 NOTE — Telephone Encounter (Signed)
Dr. Janese Banks wanted [place] to contact patient to ask how he was doing. However, looking at his chart, I saw that the patient had an appointment with Dr. Vicente Males yesterday 06/24/2019 and it stated that patient's stools are with frequency and consistency but not resolved completely.  Dr. Vicente Males had offered him to be seen at hematology to evaluate for his anemia but he has not been interested in setting up the appointment.

## 2019-06-26 ENCOUNTER — Ambulatory Visit (INDEPENDENT_AMBULATORY_CARE_PROVIDER_SITE_OTHER): Payer: Medicare Other | Admitting: Internal Medicine

## 2019-06-26 ENCOUNTER — Other Ambulatory Visit: Payer: Self-pay

## 2019-06-26 VITALS — BP 84/40 | HR 76 | Ht 67.0 in | Wt 143.5 lb

## 2019-06-26 DIAGNOSIS — I5032 Chronic diastolic (congestive) heart failure: Secondary | ICD-10-CM

## 2019-06-26 DIAGNOSIS — I2510 Atherosclerotic heart disease of native coronary artery without angina pectoris: Secondary | ICD-10-CM | POA: Diagnosis not present

## 2019-06-26 DIAGNOSIS — E861 Hypovolemia: Secondary | ICD-10-CM

## 2019-06-26 DIAGNOSIS — I9589 Other hypotension: Secondary | ICD-10-CM | POA: Diagnosis not present

## 2019-06-26 DIAGNOSIS — N184 Chronic kidney disease, stage 4 (severe): Secondary | ICD-10-CM

## 2019-06-26 DIAGNOSIS — I471 Supraventricular tachycardia: Secondary | ICD-10-CM

## 2019-06-26 MED ORDER — METOPROLOL TARTRATE 25 MG PO TABS: 25.0000 mg | ORAL_TABLET | Freq: Two times a day (BID) | ORAL | 3 refills | Status: DC

## 2019-06-26 NOTE — Progress Notes (Signed)
Follow-up Outpatient Visit Date: 06/26/2019  Primary Care Provider: Tracie Harrier, [place] [address]  Chief Complaint: Fatigue  HPI:  Bryan Brewer is a 78 y.o. male with history of CAD s/p CABG (2019 at [facility]; LIMA-LAD, SVG-diagonal, SVG-OM1, and SVG-OM2), HTN, HLD, DM2, COPD, CKD stage 4, and bladder cancer, who presents for follow-up of HFpEF.  I last saw him a month ago, at which time he reported some improvement in his leg swelling.  His leg ulcers with surrounding erythema had also improved.  We agreed to continue furosemide 80 mg PO daily.  Today, Bryan Brewer reports he that he feels tired and generally unwell.  His only localizing complaints are of a headache and diarrhea.  He was diagnosed with C. Diff colitis in late September and had recurrent diarrhea last month that necessitated further treatment with oral vancomycin.  He continues to have 3-4 watery bowel movement a day.  His leg swelling has resolved, as he remains on furosemide 80 mg daily.  He and his wife are concerned about dehydration, given recent diarrhea and decreased oral intake.  Bryan Brewer denies chest pain, shortness of breath, palpitations, and lightheadedness.  His blood pressures have been low at other recent provider visits, albeit with minimal symptoms.  --------------------------------------------------------------------------------------------------  Past Medical History:  Diagnosis Date  . Bladder cancer ([place])   . COPD (chronic obstructive pulmonary disease) ([place])   . Coronary artery disease   . Hyperlipidemia   . Hypertension   . Kidney disease   . Type 2 diabetes mellitus ([place])    Past Surgical History:  Procedure Laterality Date  . COLONOSCOPY    . CORONARY ARTERY BYPASS GRAFT  2019   [facility]; LIMA-LAD, SVG-diagonal, SVG-OM1, and SVG-OM2  . ESOPHAGOGASTRODUODENOSCOPY N/A 04/23/2019   Procedure: ESOPHAGOGASTRODUODENOSCOPY (EGD);  Surgeon: Jonathon Bellows,  MD;  Location[facility] ENDOSCOPY;  Service: Gastroenterology;  Laterality: N/A;  . ESOPHAGOGASTRODUODENOSCOPY (EGD) WITH PROPOFOL N/A 04/21/2019   Procedure: ESOPHAGOGASTRODUODENOSCOPY (EGD) WITH PROPOFOL;  Surgeon: Jonathon Bellows, MD;  Location: [facility] ENDOSCOPY;  Service: Gastroenterology;  Laterality: N/A;  . LEFT HEART CATH AND CORONARY ANGIOGRAPHY N/A 10/31/2017   Procedure: LEFT HEART CATH AND CORONARY ANGIOGRAPHY;  Surgeon: Yolonda Kida, MD;  Location: [facility];  Service: Cardiovascular;  Laterality: N/A;  . REVISION UROSTOMY CUTANEOUS      Current Meds  Medication Sig  . aspirin EC 81 MG tablet Take 81 mg by mouth every evening.   Marland Kitchen atorvastatin (LIPITOR) 80 MG tablet Take 80 mg by mouth at bedtime.  . blood glucose meter kit and supplies KIT Dispense based on patient and insurance preference. Use up to four times daily as directed. (FOR ICD-9 250.00, 250.01).  Marland Kitchen glipiZIDE (GLUCOTROL) 5 MG tablet Take 0.5 tablets (2.5 mg total) by mouth daily before breakfast.  . ipratropium-albuterol (DUONEB) 0.5-2.5 (3) MG/3ML SOLN Take 3 mLs by nebulization every 6 (six) hours as needed (wheezing, shortness of breath).  . mometasone-formoterol (DULERA) 200-5 MCG/ACT AERO Inhale 2 puffs into the lungs 2 (two) times daily.  . pantoprazole (PROTONIX) 40 MG tablet Take 1 tablet (40 mg total) by mouth daily.  . sodium bicarbonate 650 MG tablet Take 1,300 mg by mouth 3 (three) times daily.  . vancomycin (VANCOCIN) 125 MG capsule Take 1 capsule (125 mg total) by mouth 4 (four) times daily for 28 days.  . [DISCONTINUED] furosemide (LASIX) 80 MG tablet Take 1 tablet (80 mg total) by mouth daily.  . [  DISCONTINUED] metoprolol tartrate (LOPRESSOR) 50 MG tablet Take 1 tablet (50 mg total) by mouth 2 (two) times daily. (Patient taking differently: Take 25 mg by mouth 2 (two) times daily. )  . [DISCONTINUED] potassium chloride SA (KLOR-CON) 20 MEQ tablet First dose 40 mEq (2 tablets) by mouth for one dose,  then take 20 mEq (1 tablet) by mouth once a day thereafter. (Patient taking differently: 20 mEq (1 tablet) by mouth once a day thereafter.)    Allergies: Patient has no known allergies.  Social History   Tobacco Use  . Smoking status: Former Smoker    Packs/day: 1.50    Years: 35.00    Pack years: 52.50    Types: Cigarettes    Quit date: 2013    Years since quitting: 7.8  . Smokeless tobacco: Never Used  Substance Use Topics  . Alcohol use: No    Frequency: Never  . Drug use: Never    Family History  Problem Relation Age of Onset  . CAD Mother   . Stroke Mother   . CAD Father   . Stroke Father   . Breast cancer Sister     Review of Systems: A 12-system review of systems was performed and was negative except as noted in the HPI.  --------------------------------------------------------------------------------------------------  Physical Exam: BP (!) 84/40 (BP Location: Right Arm, Patient Position: Sitting, Cuff Size: Normal)   Pulse 76   Ht 5' 7"  (1.702 m)   Wt 143 lb 8 oz (65.1 kg)   SpO2 97%   BMI 22.48 kg/m   General:  Frail-appearing man, seated in a wheelchair.  He is accompanied by his wife. HEENT: No conjunctival pallor or scleral icterus. Facemask in place. Neck: Supple without lymphadenopathy, thyromegaly, JVD, or HJR. Lungs: Normal work of breathing. Clear to auscultation bilaterally without wheezes or crackles. Heart: Regular rate and rhythm without murmurs, rubs, or gallops. Non-displaced PMI. Abd: Bowel sounds present. Soft, NT/ND without hepatosplenomegaly Ext: No lower extremity edema. Skin: Warm and dry without rash.  EKG:  NSR with PAC's and non-specific T wave abnormality.  Lab Results  Component Value Date   WBC 11.9 (H) 06/02/2019   HGB 8.8 (L) 06/02/2019   HCT 28.8 (L) 06/02/2019   MCV 90.3 06/02/2019   PLT 340 06/02/2019    Lab Results  Component Value Date   NA 138 06/02/2019   K 4.1 06/02/2019   CL 104 06/02/2019   CO2 24  06/02/2019   BUN 55 (H) 06/02/2019   CREATININE 3.28 (H) 06/02/2019   GLUCOSE 181 (H) 06/02/2019   ALT 12 06/02/2019    No results found for: CHOL, HDL, LDLCALC, LDLDIRECT, TRIG, CHOLHDL  --------------------------------------------------------------------------------------------------  ASSESSMENT AND PLAN: Chronic HFpEF: Bryan Brewer appears euvolemic to dry on exam today, likely due to recent diarrhea and decreased oral intake.  We will stop furosemide today and have him return next week to reassess his volume status.  Hypotension: BP remains low today, though Mr. Shimabukuro asymptomatic other then generalized fatigue.  I worry that dehydration in the setting of C. Diff colitis is contributing to this.  We discussed hospital admission for hydration and medication titration, but Mr. Bromwell wished to avoid this.  We have agreed to hold furosemide and decrease metoprolol to 25 mg BID.  I advised him to go to the ED if his home blood pressures were to drop any further or if his symptoms worsen.  Chronic kidney disease stage 4: Most recent creatinine on 06/09/2019 through Dr. Linton Ham office  was at baseline (2.8).  As above, we will hold furosemide.  I will defer checking labs today.  I have encouraged Mr. Diemer to increase his water intake.  Coronary artery disease: No sign/symptoms to suggest worsening coronary insufficiency.  We will decrease metoprolol, as above.  No further medication changes at this time.  PSVT: Mr. Derwin was noted to have tachycardia in the setting of EGD with epinephrine.  Subsequent event monitor showed multiple episodes of SVT lasting up to 86 seconds.  I would ideally like to continue/increase metoprolol but will need to decrease it in the setting of hypotension.  If he has recurrent symptomatic tachycardia, I would favor a trial of amiodarone (at least in the short term until his other acute issues resolve).  Follow-up: Return to clinic next week for follow-up of volume  status with APP.  Nelva Bush, MD 06/27/2019 1:50 PM

## 2019-06-26 NOTE — Patient Instructions (Addendum)
Medication Instructions:  Your physician has recommended you make the following change in your medication:  1. STOP Furosemide 2. STOP Potassium 3. DECREASE Metoprolol to 25 mg twice a day  *If you need a refill on your cardiac medications before your next appointment, please call your pharmacy*  Lab Work: None If you have labs (blood work) drawn today and your tests are completely normal, you will receive your results only by: Marland Kitchen MyChart Message (if you have MyChart) OR . A paper copy in the mail If you have any lab test that is abnormal or we need to change your treatment, we will call you to review the results.  Testing/Procedures: None  Follow-Up: At [facility], you and your health needs are our priority.  As part of our continuing mission to provide you with exceptional heart care, we have created designated Provider Care Teams.  These Care Teams include your primary Cardiologist (physician) and Advanced Practice Providers (APPs -  Physician Assistants and Nurse Practitioners) who all work together to provide you with the care you need, when you need it.  Your next appointment:   Tuesday 11/24 or on Wednesday 11/25  The format for your next appointment:   In Person  Provider:   Nelva Bush, MD, Murray Hodgkins, NP or Christell Faith, PA-C  Other Instructions We will schedule you to see a provider either Tuesday or Wednesday and if no appointments available then Dr. Saunders Revel will add you on Wednesday because he is in the hospital that day. Someone will call you on Monday to get that done.

## 2019-06-27 ENCOUNTER — Encounter: Payer: Self-pay | Admitting: Internal Medicine

## 2019-06-27 DIAGNOSIS — I471 Supraventricular tachycardia: Secondary | ICD-10-CM | POA: Insufficient documentation

## 2019-07-01 ENCOUNTER — Ambulatory Visit (INDEPENDENT_AMBULATORY_CARE_PROVIDER_SITE_OTHER): Payer: Medicare Other | Admitting: Nurse Practitioner

## 2019-07-01 ENCOUNTER — Encounter: Payer: Self-pay | Admitting: Nurse Practitioner

## 2019-07-01 ENCOUNTER — Other Ambulatory Visit: Payer: Self-pay

## 2019-07-01 VITALS — BP 120/70 | HR 80 | Ht 67.0 in | Wt 151.5 lb

## 2019-07-01 DIAGNOSIS — I2510 Atherosclerotic heart disease of native coronary artery without angina pectoris: Secondary | ICD-10-CM | POA: Diagnosis not present

## 2019-07-01 DIAGNOSIS — N184 Chronic kidney disease, stage 4 (severe): Secondary | ICD-10-CM

## 2019-07-01 DIAGNOSIS — Z79899 Other long term (current) drug therapy: Secondary | ICD-10-CM

## 2019-07-01 DIAGNOSIS — I471 Supraventricular tachycardia: Secondary | ICD-10-CM

## 2019-07-01 DIAGNOSIS — I5032 Chronic diastolic (congestive) heart failure: Secondary | ICD-10-CM

## 2019-07-01 DIAGNOSIS — E785 Hyperlipidemia, unspecified: Secondary | ICD-10-CM

## 2019-07-01 MED ORDER — FUROSEMIDE 80 MG PO TABS: 40.0000 mg | ORAL_TABLET | Freq: Every day | ORAL | Status: DC

## 2019-07-01 NOTE — Progress Notes (Signed)
Office Visit    Patient Name: Bryan Brewer Date of Encounter: 07/01/2019  Primary Care Provider:  Tracie Harrier, MD Primary Cardiologist:  Nelva Bush, MD  Chief Complaint    78 year old male with a history of CAD status post NSTEMI & four-vessel bypass in March 2019 at [facility], HFpEF, hypertension, hyperlipidemia, diabetes, COPD, stage IV chronic kidney disease, and bladder cancer, who presents for follow-up related to HFpEF.  Past Medical History    Past Medical History:  Diagnosis Date  . (HFpEF) heart failure with preserved ejection fraction ([place])    a. 03/2019 Echo: EF 60-65%, nl RV fxn.  . Bladder cancer ([place])   . C. difficile colitis 04/2019  . CKD (chronic kidney disease), stage IV ([place])   . COPD (chronic obstructive pulmonary disease) ([place])   . Coronary artery disease    a. 10/2017 NSTEMI/Cath: LM 15m 75d, LAD 75ost, 90p, D1 100, LCX 762m, OM1 90, RCA 25p; b. 10/2017 CABG x 4 ([facility]): LIMA->LAD, VG->Diag, VG->OM1, VG->OM2.  . Hyperlipidemia   . Hypertension   . NSVT (nonsustained ventricular tachycardia) ([employer]   a. 06/2019 [place]: 2 runs of NSVT up to 12 beats, max rate 187 bpm.  . PSVT (paroxysmal supraventricular tachycardia) ([place]   06/2019 [place]: 1820 episodes of SVT lasting up to 00:01:16 w/ max rate of 210.  . Type 2 diabetes mellitus ([place]   Past Surgical History:  Procedure Laterality Date  . COLONOSCOPY    . CORONARY ARTERY BYPASS GRAFT  2019   [facility]; LIMA-LAD, SVG-diagonal, SVG-OM1, and SVG-OM2  . ESOPHAGOGASTRODUODENOSCOPY N/A 04/23/2019   Procedure: ESOPHAGOGASTRODUODENOSCOPY (EGD);  Surgeon: AnJonathon BellowsMD;  [facility];  Service: Gastroenterology;  Laterality: N/A;  . ESOPHAGOGASTRODUODENOSCOPY (EGD) WITH PROPOFOL N/A 04/21/2019   Procedure: ESOPHAGOGASTRODUODENOSCOPY (EGD) WITH PROPOFOL;  Surgeon: AnJonathon BellowsMD;  [facility];  Service: Gastroenterology;  Laterality: N/A;  . LEFT HEART CATH AND CORONARY ANGIOGRAPHY N/A  10/31/2017   Procedure: LEFT HEART CATH AND CORONARY ANGIOGRAPHY;  Surgeon: CaYolonda KidaMD;  [facility];  Service: Cardiovascular;  Laterality: N/A;  . REVISION UROSTOMY CUTANEOUS      Allergies  No Known Allergies  History of Present Illness    [age]ear old male with above past medical history including coronary artery disease [facility] (LIMA-LAD, VG-diagonal, VG-OM1, VG-OM2), HFpEF, hypertension, hyperlipidemia, type 2 diabetes mellitus, COPD, stage IV chronic kidney disease, C. difficile colitis (04/2019) and bladder cancer.  In September of this year, he was admitted with rectal bleeding, melena, and hematemesis with drop in hemoglobin to 5.5.  During EGD with intralesional epinephrine, he developed narrow complex tachycardia into the 170s which was determined to be sinus tachycardia.  As this occurred in the setting of anemia and epinephrine injection, conservative therapy was recommended, along with outpatient event monitoring.  Following that hospitalization, he developed C. difficile colitis.  He has also been seen several times here with volume excess and heart failure.  He has also worn an event monitor, which showed multiple episodes of SVT (1820) lasting up to 1 minute and 16 seconds.  He also had 2 brief runs of nonsustained VT.  At his most recent visit, he was euvolemic and felt to be dry in the setting of recent bout of C. difficile and diarrhea.  In the setting of hypotension (84/40), beta-blocker was reduced to 25 mg twice daily and diuretic therapy was held [creat 3.58 10/27 and  2.8 on 11/3 (baseline)].  Since his last visit, he has been feeling much better.  His weight was down to 143 at his last visit and with holding furosemide, he is back up to 151 pounds on our scale today.  He notes a good appetite and good energy overall.  He has not had any palpitations since his last visit.  He denies chest pain, dyspnea,  PND, orthopnea, dizziness, syncope, or early satiety.  His legs have been stable without any significant edema.  He has some degree of chronic erythema of his lower extremities associated with mild tenderness and this is unchanged.  Home Medications    Prior to Admission medications   Medication Sig Start Date End Date Taking? Authorizing Provider  aspirin EC 81 MG tablet Take 81 mg by mouth every evening.     [provider]  atorvastatin (LIPITOR) 80 MG tablet Take 80 mg by mouth at bedtime.    [provider]  blood glucose meter kit and supplies KIT Dispense based on patient and insurance preference. Use up to four times daily as directed. (FOR ICD-9 250.00, 250.01). 04/04/19   Gladstone Lighter, MD  glipiZIDE (GLUCOTROL) 5 MG tablet Take 0.5 tablets (2.5 mg total) by mouth daily before breakfast. 05/01/19 06/30/19  Henreitta Leber, MD  ipratropium-albuterol (DUONEB) 0.5-2.5 (3) MG/3ML SOLN Take 3 mLs by nebulization every 6 (six) hours as needed (wheezing, shortness of breath). 04/04/19   Gladstone Lighter, MD  metoprolol tartrate (LOPRESSOR) 25 MG tablet Take 1 tablet (25 mg total) by mouth 2 (two) times daily. 06/26/19 09/24/19  End, Harrell Gave, MD  mometasone-formoterol (DULERA) 200-5 MCG/ACT AERO Inhale 2 puffs into the lungs 2 (two) times daily. 04/04/19   Gladstone Lighter, MD  pantoprazole (PROTONIX) 40 MG tablet Take 1 tablet (40 mg total) by mouth daily. 04/25/19 04/24/20  Dustin Flock, MD  sodium bicarbonate 650 MG tablet Take 1,300 mg by mouth 3 (three) times daily.    [provider]  vancomycin (VANCOCIN) 125 MG capsule Take 1 capsule (125 mg total) by mouth 4 (four) times daily for 28 days. 06/10/19 07/08/19  Jonathon Bellows, MD    Review of Systems    Feeling much better today than a week ago.  He has minimal lower extremity swelling and mild, chronic lower extremity tenderness.  He has had left knee pain and this was injected earlier this week.  Walking  was easier today.  He denies chest pain, dyspnea, palpitations, PND, orthopnea, dizziness, syncope, or early satiety.  All other systems reviewed and are otherwise negative except as noted above.  Physical Exam    VS:  BP 120/70 (BP Location: Left Arm, Patient Position: Sitting, Cuff Size: Normal)   Pulse 80   Ht _0  (1.702 m)   Wt 151 lb 8 oz (68.7 kg)   BMI 23.73 kg/m  , BMI Body mass index is 23.73 kg/m. GEN: Somewhat frail, in no acute distress. HEENT: normal. Neck: Supple, no JVD, carotid bruits, or masses. Cardiac: RRR, no murmurs, rubs, or gallops. No clubbing, cyanosis, trace bilateral ankle edema.  Radials/PT 2+ and equal bilaterally.  Respiratory:  Respirations regular and unlabored, clear to auscultation bilaterally. GI: Soft, nontender, nondistended, BS + x 4. MS: no deformity or atrophy. Skin: warm and dry, no rash.  Mild erythema to bilateral lower legs with tenderness.  No significant edema or weeping.  No change per patient. Neuro:  Strength and sensation are intact. Psych: Normal affect.  Accessory Clinical Findings  ECG personally reviewed by me today -regular sinus rhythm, 80, LVH with nonspecific ST changes- no acute changes.  Lab Results  Component Value Date   WBC 11.9 (H) 06/02/2019   HGB 8.8 (L) 06/02/2019   HCT 28.8 (L) 06/02/2019   MCV 90.3 06/02/2019   PLT 340 06/02/2019   Lab Results  Component Value Date   CREATININE 3.28 (H) 06/02/2019   BUN 55 (H) 06/02/2019   NA 138 06/02/2019   K 4.1 06/02/2019   CL 104 06/02/2019   CO2 24 06/02/2019   Lab Results  Component Value Date   ALT 12 06/02/2019   AST 17 06/02/2019   ALKPHOS 52 06/02/2019   BILITOT 0.6 06/02/2019    Assessment & Plan    1.  Chronic diastolic congestive heart failure: EF of 60 to 65% by echo August 2020.  In the setting of diarrhea, he had significant dehydration and hypotension when seen on November 20 and Lasix was held.  Over the past week, he has had resolution of  diarrhea, improved appetite, and overall better energy.  His weight is up 8 pounds since his last visit though he is relatively euvolemic on examination, with only trace ankle edema.  I will follow-up a basic metabolic panel today and provided that renal function is stable, he will resume furosemide at 40 mg daily (previous home dose was 80 mg daily).  In that setting, I will follow-up a repeat basic metabolic panel in 1 week and will plan to see him back in clinic in 1 month.  We discussed the importance of daily weights, sodium restriction, medication compliance, and symptom reporting and he verbalizes understanding.   2.  Coronary artery disease: Status post four-vessel bypass in March 2019.  No chest pain or dyspnea.  He remains on aspirin, statin, beta-blocker.  3.  PSVT: 1820 episodes of SVT noted on 0 monitoring, the longest lasting 1 minute and 16 seconds.  Ideally would titrate beta-blocker however, dose required reduction last week in the setting of hypotension.  He has not had any recurrent palpitations over the past week.  His pressure is better today at 120/70 however, I am resuming furosemide as above therefore we will hold off on titrating beta-blocker.  Patient knows to contact us if he has recurrent palpitations, at which point we can consider amiodarone if pressures limit further titration of beta-blocker.  4.  Nonsustained VT: 2 episodes on monitoring.  No chest pain or dyspnea.  Continue beta-blocker therapy.  5.  Stage IV chronic kidney disease: Creatinine was at baseline of 2.8 when seen by primary care November 3.  Follow-up basic metabolic panel today as I am resuming furosemide 40 mg daily.  We will also plan to follow-up in 1 week.  6.  Hyperlipidemia: Lipids followed by primary care.  Remains on statin therapy.  7.  Disposition: Follow-up basic metabolic panel today with plan to resume furosemide and follow-up basic metabolic panel in 1 week.  Follow-up in clinic in 1 month or  sooner if necessary.  Murray Hodgkins, NP 07/01/2019, 10:04 AM

## 2019-07-01 NOTE — Patient Instructions (Signed)
Medication Instructions:  - Your physician has recommended you make the following change in your medication:   1) Resume lasix (furosemide) 80 mg- take 1/2 tablet (40 mg) by mouth once daily  *If you need a refill on your cardiac medications before your next appointment, please call your pharmacy*  Lab Work: - Your physician recommends that you have lab work today: [place] physician recommends that you return for lab work in: 1 week (12/2 or 12/3)- [place] entrance, 1st desk on the right to check in Hours: Monday-Friday (7:30 am- 5:30 pm)  If you have labs (blood work) drawn today and your tests are completely normal, you will receive your results only by: Marland Kitchen MyChart Message (if you have MyChart) OR . A paper copy in the mail If you have any lab test that is abnormal or we need to change your treatment, we will call you to review the results.  Testing/Procedures: - none ordered  Follow-Up: At [facility], you and your health needs are our priority.  As part of our continuing mission to provide you with exceptional heart care, we have created designated Provider Care Teams.  These Care Teams include your primary Cardiologist (physician) and Advanced Practice Providers (APPs -  Physician Assistants and Nurse Practitioners) who all work together to provide you with the care you need, when you need it.  Your next appointment:   1 month(s)  The format for your next appointment:   In Person  Provider:   Nelva Bush, MD  Other Instructions n/a

## 2019-07-02 LAB — BASIC METABOLIC PANEL
BUN/Creatinine Ratio: 26 — ABNORMAL HIGH (ref 10–24)
BUN: 70 mg/dL — ABNORMAL HIGH (ref 8–27)
CO2: 16 mmol/L — ABNORMAL LOW (ref 20–29)
Calcium: 9.2 mg/dL (ref 8.6–10.2)
Chloride: 110 mmol/L — ABNORMAL HIGH (ref 96–106)
Creatinine, Ser: 2.74 mg/dL — ABNORMAL HIGH (ref 0.76–1.27)
GFR calc Af Amer: 25 mL/min/{1.73_m2} — ABNORMAL LOW (ref >59)
GFR calc non Af Amer: 21 mL/min/{1.73_m2} — ABNORMAL LOW (ref >59)
Glucose: 194 mg/dL — ABNORMAL HIGH (ref 65–99)
Potassium: 5 mmol/L (ref 3.5–5.2)
Sodium: 139 mmol/L (ref 134–144)

## 2019-07-06 ENCOUNTER — Telehealth: Payer: Self-pay

## 2019-07-06 NOTE — Telephone Encounter (Signed)
-----   Message from Theora Gianotti, NP sent at 07/02/2019  7:03 AM EST ----- Renal fxn stable.  Resume lasix as discussed in clinic and f/u bmet in 1 wk as planned.

## 2019-07-06 NOTE — Telephone Encounter (Signed)
Call to patient to discuss labs results and POC.   Pt verbalized understanding and had no further questions at this time. He will report to the medical mall on Wednesday. Orders active in chart.   Advised pt to call for any further questions or concerns.

## 2019-07-07 ENCOUNTER — Other Ambulatory Visit: Payer: Self-pay

## 2019-07-07 ENCOUNTER — Inpatient Hospital Stay: Payer: Medicare Other

## 2019-07-07 ENCOUNTER — Other Ambulatory Visit: Payer: Self-pay | Admitting: *Deleted

## 2019-07-07 ENCOUNTER — Encounter: Payer: Self-pay | Admitting: Oncology

## 2019-07-07 ENCOUNTER — Inpatient Hospital Stay: Payer: Medicare Other | Attending: Oncology

## 2019-07-07 ENCOUNTER — Telehealth: Payer: Self-pay

## 2019-07-07 ENCOUNTER — Inpatient Hospital Stay: Payer: Medicare Other | Admitting: Oncology

## 2019-07-07 DIAGNOSIS — I5032 Chronic diastolic (congestive) heart failure: Secondary | ICD-10-CM

## 2019-07-07 DIAGNOSIS — D649 Anemia, unspecified: Secondary | ICD-10-CM

## 2019-07-07 DIAGNOSIS — D509 Iron deficiency anemia, unspecified: Secondary | ICD-10-CM | POA: Diagnosis present

## 2019-07-07 DIAGNOSIS — N184 Chronic kidney disease, stage 4 (severe): Secondary | ICD-10-CM | POA: Diagnosis not present

## 2019-07-07 DIAGNOSIS — Z79899 Other long term (current) drug therapy: Secondary | ICD-10-CM

## 2019-07-07 LAB — CBC
HCT: 33.8 % — ABNORMAL LOW (ref 39.0–52.0)
Hemoglobin: 10 g/dL — ABNORMAL LOW (ref 13.0–17.0)
MCH: 27.5 pg (ref 26.0–34.0)
MCHC: 29.6 g/dL — ABNORMAL LOW (ref 30.0–36.0)
MCV: 93.1 fL (ref 80.0–100.0)
Platelets: 320 10*3/uL (ref 150–400)
RBC: 3.63 MIL/uL — ABNORMAL LOW (ref 4.22–5.81)
RDW: 15.9 % — ABNORMAL HIGH (ref 11.5–15.5)
WBC: 11.9 10*3/uL — ABNORMAL HIGH (ref 4.0–10.5)
nRBC: 0 % (ref 0.0–0.2)

## 2019-07-07 LAB — BASIC METABOLIC PANEL
Anion gap: 8 (ref 5–15)
BUN: 73 mg/dL — ABNORMAL HIGH (ref 8–23)
CO2: 17 mmol/L — ABNORMAL LOW (ref 22–32)
Calcium: 8.7 mg/dL — ABNORMAL LOW (ref 8.9–10.3)
Chloride: 112 mmol/L — ABNORMAL HIGH (ref 98–111)
Creatinine, Ser: 3.04 mg/dL — ABNORMAL HIGH (ref 0.61–1.24)
GFR calc Af Amer: 22 mL/min — ABNORMAL LOW (ref >60)
GFR calc non Af Amer: 19 mL/min — ABNORMAL LOW (ref >60)
Glucose, Bld: 133 mg/dL — ABNORMAL HIGH (ref 70–99)
Potassium: 4.4 mmol/L (ref 3.5–5.1)
Sodium: 137 mmol/L (ref 135–145)

## 2019-07-07 LAB — IRON AND TIBC
Iron: 32 ug/dL — ABNORMAL LOW (ref 45–182)
Saturation Ratios: 13 % — ABNORMAL LOW (ref 17.9–39.5)
TIBC: 257 ug/dL (ref 250–450)
UIBC: 225 ug/dL

## 2019-07-07 LAB — FERRITIN: Ferritin: 89 ng/mL (ref 24–336)

## 2019-07-07 NOTE — Progress Notes (Unsigned)
Patient stated that he had been doing well with no concerns. 

## 2019-07-07 NOTE — Telephone Encounter (Signed)
Call to patient to discuss results and POC.   Pt verbalized understanding and will go to the medical mall in 1 week for repeat labs.   Advised pt to call for any further questions or concerns.

## 2019-07-07 NOTE — Telephone Encounter (Signed)
-----   Message from Theora Gianotti, NP sent at 07/07/2019  1:39 PM EST ----- Creatinine sl higher than baseline since resuming a lower dose of lasix.   F/u bmet in a week to ensure stability.

## 2019-07-08 ENCOUNTER — Ambulatory Visit: Payer: Medicare Other | Admitting: Gastroenterology

## 2019-07-08 ENCOUNTER — Encounter: Payer: Self-pay | Admitting: Gastroenterology

## 2019-07-08 VITALS — BP 133/58 | HR 64 | Temp 98.0°F | Ht 67.0 in | Wt 144.2 lb

## 2019-07-08 DIAGNOSIS — A0472 Enterocolitis due to Clostridium difficile, not specified as recurrent: Secondary | ICD-10-CM | POA: Diagnosis not present

## 2019-07-08 MED ORDER — VANCOMYCIN HCL 125 MG PO CAPS: ORAL_CAPSULE | ORAL | 0 refills | Status: DC

## 2019-07-08 NOTE — Progress Notes (Signed)
Jonathon Bellows MD, MRCP(U.K) [address]  [place]  [place], [place] [address]  Main: [phone]  Fax: [phone]   Primary Care Physician: Tracie Harrier, MD  Primary Gastroenterologist:  Dr. Jonathon Bellows   No chief complaint on file.   HPI: Bryan Brewer is a 78 y.o. male   Summary of history :  He was admitted on 04/20/2019 with a GI bleed. He has previously been seen by [facility] at [facility]. History of diagnosis of small bowel bacterial overgrowth syndrome, bladder cancer, sepsis related to UTI. He presented to the ER on 04/20/2019 with rectal bleeding, hematemesis, melena.On admission his hemoglobin was 5.5 g with a BUN of 111 and a creatinine of 2.96.  EGD on 04/21/2019, noted DuleFoy lesion which was injected with epinephrine and clipped. During the procedure he developed episode of V. tach probably related to adrenaline that was injected into the dual for lesion. Hence the procedure was terminated, the next day I placed clips over the data for lesion.  Treated for C. difficile diarrhea with vancomycin but recurred after he went off the antibiotics after completing the course.  I had referred him to see hematology for anemia despite normalization of his iron studies.  The cancer center tried contacting him on multiple occasions but he was not too keen on making an appointment. 05/25/2019: H. pylori breath test negative, ferritin of 231  05/26/19 20: Stool positive for C. difficile toxin , intended to commence him on Dificid but his insurance did not cover it and hence had to be started on vancomycin slow taper.   Interval history11/18/2020-07/08/2019 No diarrhea has 2 soft formed bowel movements per day.  Unfortunately still taking 4 tablets of vancomycin the day has not weaned down as suggested.      Current Outpatient Medications  Medication Sig Dispense Refill  . aspirin EC 81 MG tablet Take 81 mg by mouth every evening.     Marland Kitchen atorvastatin (LIPITOR) 80  MG tablet Take 80 mg by mouth at bedtime.    . blood glucose meter kit and supplies KIT Dispense based on patient and insurance preference. Use up to four times daily as directed. (FOR ICD-9 250.00, 250.01). 1 each 1  . furosemide (LASIX) 80 MG tablet Take 0.5 tablets (40 mg total) by mouth daily.    Marland Kitchen glipiZIDE (GLUCOTROL) 5 MG tablet Take 0.5 tablets (2.5 mg total) by mouth daily before breakfast. 15 tablet 1  . ipratropium-albuterol (DUONEB) 0.5-2.5 (3) MG/3ML SOLN Take 3 mLs by nebulization every 6 (six) hours as needed (wheezing, shortness of breath). 360 mL 1  . metoprolol tartrate (LOPRESSOR) 25 MG tablet Take 1 tablet (25 mg total) by mouth 2 (two) times daily. 180 tablet 3  . mometasone-formoterol (DULERA) 200-5 MCG/ACT AERO Inhale 2 puffs into the lungs 2 (two) times daily. 1 g 2  . pantoprazole (PROTONIX) 40 MG tablet Take 1 tablet (40 mg total) by mouth daily. 30 tablet 11  . sodium bicarbonate 650 MG tablet Take 1,300 mg by mouth 3 (three) times daily.    . vancomycin (VANCOCIN) 125 MG capsule Take 1 capsule (125 mg total) by mouth 4 (four) times daily for 28 days. 112 capsule 0   No current facility-administered medications for this visit.     Allergies as of 07/08/2019  . (No Known Allergies)    ROS:  General: Negative for anorexia, weight loss, fever, chills, fatigue, weakness. ENT: Negative for hoarseness, difficulty swallowing , nasal congestion. CV: Negative for  chest pain, angina, palpitations, dyspnea on exertion, peripheral edema.  Respiratory: Negative for dyspnea at rest, dyspnea on exertion, cough, sputum, wheezing.  GI: See history of present illness. GU:  Negative for dysuria, hematuria, urinary incontinence, urinary frequency, nocturnal urination.  Endo: Negative for unusual weight change.    Physical Examination:   There were no vitals taken for this visit.  General: Well-nourished, well-developed in no acute distress.  Eyes: No icterus. Conjunctivae  pink. Mouth: Oropharyngeal mucosa moist and pink , no lesions erythema or exudate. Lungs: Clear to auscultation bilaterally. Non-labored. Heart: Regular rate and rhythm, no murmurs rubs or gallops.  Abdomen: Bowel sounds are normal, nontender, nondistended, no hepatosplenomegaly or masses, no abdominal bruits or hernia , no rebound or guarding.   Extremities: No lower extremity edema. No clubbing or deformities. Neuro: Alert and oriented x 3.  Grossly intact. Skin: Warm and dry, no jaundice.   Psych: Alert and cooperative, normal mood and affect.   Imaging Studies: No results found.  Assessment and Plan:   [name] is a 78 y.o. y/o male  here to follow-up for first recurrence of C. difficile diarrhea on a tapering dose of vancomycin as his insurance did not cover for Dificid.  Admit to the hospital in 04/26/2019 from an upper GI bleed secondary to a Dieulefoy  lesion of the stomach which was injected and clipped.  First recurrence of C. difficile diarrhea and on a slow taper of vancomycin.    Diarrhea resolved Plan 1.    Decrease vancomycin to 3 tablets a day for 2 weeks, then 2 tablets a day for 2 weeks, then 1 tablet/day for 2 weeks and then stop  Dr Jonathon Bellows  MD,MRCP [facility]) Follow up in as needed

## 2019-07-08 NOTE — Addendum Note (Signed)
Addended by: Dorethea Clan on: 07/08/2019 11:49 AM   Modules accepted: Orders

## 2019-07-09 ENCOUNTER — Other Ambulatory Visit
Admission: RE | Admit: 2019-07-09 | Discharge: 2019-07-09 | Disposition: A | Discharge status: Home / Self Care | Payer: Medicare Other | Source: Ambulatory Visit | Attending: Nurse Practitioner | Admitting: Nurse Practitioner

## 2019-07-09 DIAGNOSIS — Z79899 Other long term (current) drug therapy: Secondary | ICD-10-CM | POA: Insufficient documentation

## 2019-07-09 DIAGNOSIS — I5032 Chronic diastolic (congestive) heart failure: Secondary | ICD-10-CM | POA: Insufficient documentation

## 2019-07-09 LAB — BASIC METABOLIC PANEL
Anion gap: 10 (ref 5–15)
BUN: 75 mg/dL — ABNORMAL HIGH (ref 8–23)
CO2: 17 mmol/L — ABNORMAL LOW (ref 22–32)
Calcium: 8.8 mg/dL — ABNORMAL LOW (ref 8.9–10.3)
Chloride: 112 mmol/L — ABNORMAL HIGH (ref 98–111)
Creatinine, Ser: 3.13 mg/dL — ABNORMAL HIGH (ref 0.61–1.24)
GFR calc Af Amer: 21 mL/min — ABNORMAL LOW (ref >60)
GFR calc non Af Amer: 18 mL/min — ABNORMAL LOW (ref >60)
Glucose, Bld: 114 mg/dL — ABNORMAL HIGH (ref 70–99)
Potassium: 4.4 mmol/L (ref 3.5–5.1)
Sodium: 139 mmol/L (ref 135–145)

## 2019-07-10 ENCOUNTER — Telehealth: Payer: Self-pay

## 2019-07-10 DIAGNOSIS — N184 Chronic kidney disease, stage 4 (severe): Secondary | ICD-10-CM

## 2019-07-10 MED ORDER — FUROSEMIDE 20 MG PO TABS: 20.0000 mg | ORAL_TABLET | Freq: Every day | ORAL | 2 refills | Status: DC

## 2019-07-10 NOTE — Telephone Encounter (Signed)
-----   Message from Theora Gianotti, NP sent at 07/09/2019  5:37 PM EST ----- BUN/Creat higher since last check 2 days ago.  Reduce lasix to 20mg  daily.  F/u bmet in 1 wk.

## 2019-07-10 NOTE — Telephone Encounter (Signed)
Call to patient to discuss lab results and POC from Ignacia Bayley, NP.   Pt verbalized understanding in POC. Rx updated and sent to pt preferred pharmacy. Lab ordered for the medical mall in 1 week.   Advised pt to call for any further questions or concerns.

## 2019-07-14 ENCOUNTER — Other Ambulatory Visit: Payer: Self-pay | Admitting: Oncology

## 2019-07-14 DIAGNOSIS — D509 Iron deficiency anemia, unspecified: Secondary | ICD-10-CM

## 2019-07-15 ENCOUNTER — Other Ambulatory Visit: Payer: Self-pay

## 2019-07-15 NOTE — Progress Notes (Signed)
Patient pre screened for office appointment, no questions or concerns today. Patient reminded of upcoming appointment time and date. Patient on ABT.

## 2019-07-16 ENCOUNTER — Inpatient Hospital Stay: Payer: Medicare Other

## 2019-07-16 ENCOUNTER — Inpatient Hospital Stay (HOSPITAL_BASED_OUTPATIENT_CLINIC_OR_DEPARTMENT_OTHER): Payer: Medicare Other | Admitting: Oncology

## 2019-07-16 ENCOUNTER — Other Ambulatory Visit: Payer: Self-pay

## 2019-07-16 VITALS — BP 128/61 | HR 79 | Temp 97.6°F | Wt 147.0 lb

## 2019-07-16 DIAGNOSIS — D638 Anemia in other chronic diseases classified elsewhere: Secondary | ICD-10-CM | POA: Diagnosis not present

## 2019-07-16 DIAGNOSIS — D509 Iron deficiency anemia, unspecified: Secondary | ICD-10-CM

## 2019-07-16 MED ORDER — SODIUM CHLORIDE 0.9 % IV SOLN
Freq: Once | INTRAVENOUS | Status: AC
  Administered 2019-07-16: 11:00:00 via INTRAVENOUS
  Filled 2019-07-16: qty 250

## 2019-07-16 MED ORDER — IRON SUCROSE 20 MG/ML IV SOLN
200.0000 mg | Freq: Once | INTRAVENOUS | Status: AC
  Administered 2019-07-16: 200 mg via INTRAVENOUS
  Filled 2019-07-16: qty 10

## 2019-07-16 NOTE — Progress Notes (Signed)
Patient stated that he had been doing well. Patient stated that he sometimes have bilateral leg pain due to arthritis. Patient stated that he had not noticed any blood loss.

## 2019-07-17 ENCOUNTER — Encounter: Payer: Self-pay | Admitting: Oncology

## 2019-07-17 NOTE — Progress Notes (Signed)
Hematology/Oncology Consult note [facility]  Telephone:([phone] [phone]  Patient Care Team: Tracie Harrier, MD as PCP - General (Internal Medicine) End, Harrell Gave, MD as PCP - Cardiology (Cardiology)   Name of the patient: Bryan Brewer  [id number]  12-Feb-1941   Date of visit: 07/17/19  Diagnosis-anemia likely multifactorial secondary to chronic disease as well as iron deficiency  Chief complaint/ Reason for visit-routine follow-up of iron deficiency anemia  Heme/Onc history: Patient is a 78 year old male with past medical history significant for bladder cancer, small bowel bacterial overgrowth syndrome and recent episode of C. difficile diarrhea for which he had seen Dr. Vicente Males.  He was on oral vancomycin but had his first recurrence and was supposed to take Dificid which she has not started taking yet.  He has been referred to [place] for anemia.  Most recent CBC from 05/20/2019 showed white count of 13.1, H&H of 9.1/27.8 with an MCV of 89 and a platelet count of 321.  Iron study showed a normal ferritin of 231 with a low iron saturation of 11% and TIBC of 308.  B12 and folate were normal.  Of note patient has had chronic microcytic anemia over the last 1-1/2-year hemoglobin is fluctuated between 8-9.   Results of blood work from 05/26/2019 were as follows: CBC showed white count of 11.9, H&H of 8.8/28.8 and a platelet count of 340.  Reticulocyte count was low at 1.5.  TSH was normal at 1.7.  Serum immunofixation showed IgG kappa but no M protein detected on SPEP.  Both kappa and lambda light chains were elevated with a normal kappa lambda light chain ratio 1.2.  Haptoglobin normal at 349.  Patient was supposed to receive IV iron at that time but had ongoing C. difficile and could not come for his infusion.  Interval history-patient currently reports feeling better.  His diarrhea has resolved.  He has ongoing fatigue  ECOG PS- 1 Pain scale- 0  Opioid associated constipation- no  Review of systems- Review of Systems  Constitutional: Positive for malaise/fatigue. Negative for chills, fever and weight loss.  HENT: Negative for congestion, ear discharge and nosebleeds.   Eyes: Negative for blurred vision.  Respiratory: Negative for cough, hemoptysis, sputum production, shortness of breath and wheezing.   Cardiovascular: Negative for chest pain, palpitations, orthopnea and claudication.  Gastrointestinal: Negative for abdominal pain, blood in stool, constipation, diarrhea, heartburn, melena, nausea and vomiting.  Genitourinary: Negative for dysuria, flank pain, frequency, hematuria and urgency.  Musculoskeletal: Negative for back pain, joint pain and myalgias.  Skin: Negative for rash.  Neurological: Negative for dizziness, tingling, focal weakness, seizures, weakness and headaches.  Endo/Heme/Allergies: Does not bruise/bleed easily.  Psychiatric/Behavioral: Negative for depression and suicidal ideas. The patient does not have insomnia.       No Known Allergies   Past Medical History:  Diagnosis Date  . (HFpEF) heart failure with preserved ejection fraction ([place])    a. 03/2019 Echo: EF 60-65%, nl RV fxn.  . Bladder cancer ([place])   . C. difficile colitis 04/2019  . CKD (chronic kidney disease), stage IV ([place])   . COPD (chronic obstructive pulmonary disease) ([place])   . Coronary artery disease    a. 10/2017 NSTEMI/Cath: LM 71m 75d, LAD 75ost, 90p, D1 100, LCX 77m, OM1 90, RCA 25p; b. 10/2017 CABG x 4 ([facility]): LIMA->LAD, VG->Diag, VG->OM1, VG->OM2.  . Hyperlipidemia   . Hypertension   . [place] ([place]ustained [place]cardia) [place]   a. 06/2019 Zio:  2 runs of NSVT up to 12 beats, max rate 187 bpm.  . PSVT (paroxysmal supraventricular tachycardia) ([place])    06/2019 Zio: 1820 episodes of SVT lasting up to 00:01:16 w/ max rate of 210.  . Type 2 diabetes mellitus ([place])      Past Surgical History:  Procedure Laterality Date   . COLONOSCOPY    . CORONARY ARTERY BYPASS GRAFT  2019   [facility]; LIMA-LAD, SVG-diagonal, SVG-OM1, and SVG-OM2  . ESOPHAGOGASTRODUODENOSCOPY N/A 04/23/2019   Procedure: ESOPHAGOGASTRODUODENOSCOPY (EGD);  Surgeon: Jonathon Bellows, MD;  Location[facility] ENDOSCOPY;  Service: Gastroenterology;  Laterality: N/A;  . ESOPHAGOGASTRODUODENOSCOPY (EGD) WITH PROPOFOL N/A 04/21/2019   Procedure: ESOPHAGOGASTRODUODENOSCOPY (EGD) WITH PROPOFOL;  Surgeon: Jonathon Bellows, MD;  Location: [facility] ENDOSCOPY;  Service: Gastroenterology;  Laterality: N/A;  . LEFT HEART CATH AND CORONARY ANGIOGRAPHY N/A 10/31/2017   Procedure: LEFT HEART CATH AND CORONARY ANGIOGRAPHY;  Surgeon: Yolonda Kida, MD;  Location: [facility] CV LAB;  Service: Cardiovascular;  Laterality: N/A;  . REVISION UROSTOMY CUTANEOUS      Social History   Socioeconomic History  . Marital status: Married    Spouse name: Not on file  . Number of children: Not on file  . Years of education: Not on file  . Highest education level: Not on file  Occupational History  . Not on file  Tobacco Use  . Smoking status: Former Smoker    Packs/day: 1.50    Years: 35.00    Pack years: 52.50    Types: Cigarettes    Quit date: 2013    Years since quitting: 7.9  . Smokeless tobacco: Never Used  Substance and Sexual Activity  . Alcohol use: No  . Drug use: Never  . Sexual activity: Not Currently  Other Topics Concern  . Not on file  Social History Narrative  . Not on file   Social Determinants of Health   Financial Resource Strain: Low Risk   . Difficulty of Paying Living Expenses: Not hard at all  Food Insecurity: Unknown  . Worried About Charity fundraiser in the Last Year: Patient refused  . Ran Out of Food in the Last Year: Patient refused  Transportation Needs: Unknown  . Lack of Transportation (Medical): Patient refused  . Lack of Transportation (Non-Medical): Patient refused  Physical Activity: Unknown  . Days of Exercise per Week: Patient  refused  . Minutes of Exercise per Session: Patient refused  Stress: Unknown  . Feeling of Stress : Patient refused  Social Connections: Unknown  . Frequency of Communication with Friends and Family: Patient refused  . Frequency of Social Gatherings with Friends and Family: Patient refused  . Attends Religious Services: Patient refused  . Active Member of Clubs or Organizations: Patient refused  . Attends Archivist Meetings: Patient refused  . Marital Status: Patient refused  Intimate Partner Violence: Unknown  . Fear of Current or Ex-Partner: Patient refused  . Emotionally Abused: Patient refused  . Physically Abused: Patient refused  . Sexually Abused: Patient refused    Family History  Problem Relation Age of Onset  . CAD Mother   . Stroke Mother   . CAD Father   . Stroke Father   . Breast cancer Sister      Current Outpatient Medications:  .  aspirin EC 81 MG tablet, Take 81 mg by mouth every evening. , Disp: , Rfl:  .  atorvastatin (LIPITOR) 80 MG tablet, Take 80 mg by mouth at bedtime., Disp: , Rfl:  .  blood glucose meter kit and supplies KIT, Dispense based on patient and insurance preference. Use up to four times daily as directed. (FOR ICD-9 250.00, 250.01)., Disp: 1 each, Rfl: 1 .  furosemide (LASIX) 20 MG tablet, Take 1 tablet (20 mg total) by mouth daily., Disp: 30 tablet, Rfl: 2 .  glipiZIDE (GLUCOTROL) 5 MG tablet, Take 0.5 tablets (2.5 mg total) by mouth daily before breakfast., Disp: 15 tablet, Rfl: 1 .  ipratropium-albuterol (DUONEB) 0.5-2.5 (3) MG/3ML SOLN, Take 3 mLs by nebulization every 6 (six) hours as needed (wheezing, shortness of breath)., Disp: 360 mL, Rfl: 1 .  metoprolol tartrate (LOPRESSOR) 25 MG tablet, Take 1 tablet (25 mg total) by mouth 2 (two) times daily., Disp: 180 tablet, Rfl: 3 .  mometasone-formoterol (DULERA) 200-5 MCG/ACT AERO, Inhale 2 puffs into the lungs 2 (two) times daily., Disp: 1 g, Rfl: 2 .  pantoprazole (PROTONIX) 40  MG tablet, Take 1 tablet (40 mg total) by mouth daily., Disp: 30 tablet, Rfl: 11 .  sodium bicarbonate 650 MG tablet, Take 1,300 mg by mouth 3 (three) times daily., Disp: , Rfl:  .  vancomycin (VANCOCIN) 125 MG capsule, Take 3 caps daily for 2 weeks, reduce to 2 caps daily for 2 weeks, reduce to 1 cap daily for 2 weeks, Disp: 84 capsule, Rfl: 0  Physical exam:  Vitals:   07/16/19 0955  BP: 128/61  Pulse: 79  Temp: 97.6 F (36.4 C)  TempSrc: Tympanic  Weight: 147 lb (66.7 kg)   Physical Exam Constitutional:      Comments: Elderly gentleman in no acute distress  HENT:     Head: Normocephalic and atraumatic.  Eyes:     Pupils: Pupils are equal, round, and reactive to light.  Cardiovascular:     Rate and Rhythm: Normal rate and regular rhythm.     Heart sounds: Normal heart sounds.  Pulmonary:     Effort: Pulmonary effort is normal.     Breath sounds: Normal breath sounds.  Abdominal:     General: Bowel sounds are normal.     Palpations: Abdomen is soft.  Musculoskeletal:     Cervical back: Normal range of motion.  Skin:    General: Skin is warm and dry.  Neurological:     Mental Status: He is alert and oriented to person, place, and time.      CMP Latest Ref Rng & Units 07/09/2019  Glucose 70 - 99 mg/dL 114(H)  BUN 8 - 23 mg/dL 75(H)  Creatinine 0.61 - 1.24 mg/dL 3.13(H)  Sodium 135 - 145 mmol/L 139  Potassium 3.5 - 5.1 mmol/L 4.4  Chloride 98 - 111 mmol/L 112(H)  CO2 22 - 32 mmol/L 17(L)  Calcium 8.9 - 10.3 mg/dL 8.8(L)  Total Protein 6.5 - 8.1 g/dL -  Total Bilirubin 0.3 - 1.2 mg/dL -  Alkaline Phos 38 - 126 U/L -  AST 15 - 41 U/L -  ALT 0 - 44 U/L -   CBC Latest Ref Rng & Units 07/07/2019  WBC 4.0 - 10.5 K/uL 11.9(H)  Hemoglobin 13.0 - 17.0 g/dL 10.0(L)  Hematocrit 39.0 - 52.0 % 33.8(L)  Platelets 150 - 400 K/uL 320     Assessment and plan- Patient is a 78 y.o. male with anemia of chronic disease and iron deficiency anemia here for routine follow-up   After treating his C. difficile patient's hemoglobin has improved from 8.8-10.  However, patient still has evidence of moderate anemia.  Ferritin levels are normal at 89  although I suspect it is partly falsely elevated due to his recent C. difficile.  Iron studies do show a low iron saturation of 13%.  I think the patient will benefit from a trial of IV iron to see if his anemia improves.  Recommend 5 doses of Venofer 200 mg IV twice a week starting today.  Discussed risks and benefits of IV iron including all but not limited to headache, nausea, leg swelling and possible risk of infusion reaction.  Patient understands and agrees to proceed as planned.  I will tentatively see him back in 3 months with repeat CBC ferritin and iron studies   Visit Diagnosis 1. Iron deficiency anemia, unspecified iron deficiency anemia type   2. Anemia of chronic disease      Dr. Randa Evens, MD, MPH [facility] [phone] 07/17/2019 9:41 AM

## 2019-07-20 ENCOUNTER — Inpatient Hospital Stay: Payer: Medicare Other

## 2019-07-20 ENCOUNTER — Other Ambulatory Visit: Payer: Self-pay

## 2019-07-20 VITALS — BP 128/62 | HR 65 | Temp 96.3°F | Resp 20

## 2019-07-20 DIAGNOSIS — D509 Iron deficiency anemia, unspecified: Secondary | ICD-10-CM

## 2019-07-20 MED ORDER — IRON SUCROSE 20 MG/ML IV SOLN
200.0000 mg | Freq: Once | INTRAVENOUS | Status: AC
  Administered 2019-07-20: 200 mg via INTRAVENOUS
  Filled 2019-07-20: qty 10

## 2019-07-20 MED ORDER — SODIUM CHLORIDE 0.9 % IV SOLN
Freq: Once | INTRAVENOUS | Status: AC
  Administered 2019-07-20: 14:00:00 via INTRAVENOUS
  Filled 2019-07-20: qty 250

## 2019-07-22 ENCOUNTER — Other Ambulatory Visit: Payer: Self-pay

## 2019-07-22 ENCOUNTER — Inpatient Hospital Stay: Payer: Medicare Other

## 2019-07-22 VITALS — BP 134/72 | HR 66 | Resp 18

## 2019-07-22 DIAGNOSIS — D509 Iron deficiency anemia, unspecified: Secondary | ICD-10-CM | POA: Diagnosis not present

## 2019-07-22 MED ORDER — IRON SUCROSE 20 MG/ML IV SOLN
200.0000 mg | Freq: Once | INTRAVENOUS | Status: AC
  Administered 2019-07-22: 200 mg via INTRAVENOUS
  Filled 2019-07-22: qty 10

## 2019-07-22 MED ORDER — SODIUM CHLORIDE 0.9 % IV SOLN
Freq: Once | INTRAVENOUS | Status: AC
  Filled 2019-07-22: qty 250

## 2019-07-24 ENCOUNTER — Inpatient Hospital Stay: Payer: Medicare Other

## 2019-07-24 ENCOUNTER — Other Ambulatory Visit: Payer: Self-pay

## 2019-07-24 VITALS — BP 108/48 | HR 71 | Temp 98.1°F | Resp 18

## 2019-07-24 DIAGNOSIS — D509 Iron deficiency anemia, unspecified: Secondary | ICD-10-CM

## 2019-07-24 MED ORDER — IRON SUCROSE 20 MG/ML IV SOLN
200.0000 mg | Freq: Once | INTRAVENOUS | Status: AC
  Administered 2019-07-24: 200 mg via INTRAVENOUS
  Filled 2019-07-24: qty 10

## 2019-07-24 MED ORDER — SODIUM CHLORIDE 0.9 % IV SOLN
Freq: Once | INTRAVENOUS | Status: AC
  Filled 2019-07-24: qty 250

## 2019-07-27 ENCOUNTER — Other Ambulatory Visit: Payer: Self-pay

## 2019-07-27 ENCOUNTER — Inpatient Hospital Stay: Payer: Medicare Other

## 2019-07-27 VITALS — BP 118/78 | HR 70 | Temp 98.0°F | Resp 18

## 2019-07-27 DIAGNOSIS — D509 Iron deficiency anemia, unspecified: Secondary | ICD-10-CM | POA: Diagnosis not present

## 2019-07-27 MED ORDER — SODIUM CHLORIDE 0.9 % IV SOLN
Freq: Once | INTRAVENOUS | Status: AC
  Filled 2019-07-27: qty 250

## 2019-07-27 MED ORDER — IRON SUCROSE 20 MG/ML IV SOLN
200.0000 mg | Freq: Once | INTRAVENOUS | Status: AC
  Administered 2019-07-27: 200 mg via INTRAVENOUS
  Filled 2019-07-27: qty 10

## 2019-08-06 ENCOUNTER — Other Ambulatory Visit: Payer: Self-pay

## 2019-08-06 ENCOUNTER — Encounter: Payer: Self-pay | Admitting: Internal Medicine

## 2019-08-06 ENCOUNTER — Ambulatory Visit (INDEPENDENT_AMBULATORY_CARE_PROVIDER_SITE_OTHER): Payer: Medicare Other | Admitting: Internal Medicine

## 2019-08-06 VITALS — BP 106/50 | HR 67 | Ht 67.0 in | Wt 150.5 lb

## 2019-08-06 DIAGNOSIS — N184 Chronic kidney disease, stage 4 (severe): Secondary | ICD-10-CM

## 2019-08-06 DIAGNOSIS — I471 Supraventricular tachycardia: Secondary | ICD-10-CM

## 2019-08-06 DIAGNOSIS — I5032 Chronic diastolic (congestive) heart failure: Secondary | ICD-10-CM | POA: Diagnosis not present

## 2019-08-06 DIAGNOSIS — I2510 Atherosclerotic heart disease of native coronary artery without angina pectoris: Secondary | ICD-10-CM

## 2019-08-06 NOTE — Progress Notes (Signed)
Follow-up Outpatient Visit Date: 08/06/2019  Primary Care Provider: Tracie Harrier, [place] [address]  Chief Complaint: Follow-up HFpEF and CAD  HPI:  [name] is a 78 y.o. male with history of CAD s/p CABG (2019 at [facility]; LIMA-LAD, SVG-diagonal, SVG-OM1, and SVG-OM2), HTN, HLD, DM2, COPD, CKD stage 4, and bladder cancer, who presents for follow-up of coronary artery disease and HFpEF.  He was last seen in our office on 07/01/2019, which time he reported feeling much better earlier in the month, he had become dehydrated in the setting of diarrhea secondary to C. difficile colitis.  He was advised to resume furosemide 40 mg daily.  No other medication changes were made.  Follow-up creatinine had increased slightly, prompting reduction in furosemide to 20 mg daily.  Today, [name] reports that he is feeling quite well.  He has minimal leg swelling.  He has continued to gain weight but does not feel that this is due to fluid retention but rather improvement in his appetite.  He has not had any chest pain, shortness of breath, palpitations, lightheadedness, orthopnea, and PND.  He is tolerating his medications well.  Diarrhea has ceased and he only has a few more days of oral vancomycin to complete his treatment for C. difficile.  --------------------------------------------------------------------------------------------------  Past Medical History:  Diagnosis Date  . (HFpEF) heart failure with preserved ejection fraction ([place])    a. 03/2019 Echo: EF 60-65%, nl RV fxn.  . Bladder cancer ([place])   . C. difficile colitis 04/2019  . CKD (chronic kidney disease), stage IV ([place])   . COPD (chronic obstructive pulmonary disease) ([place])   . Coronary artery disease    a. 10/2017 NSTEMI/Cath: LM 75m 75d, LAD 75ost, 90p, D1 100, LCX 742m, OM1 90, RCA 25p; b. 10/2017 CABG x 4 ([facility]): LIMA->LAD, VG->Diag, VG->OM1, VG->OM2.  . Hyperlipidemia   .  Hypertension   . NSVT (nonsustained ventricular tachycardia) ([place]   a. 06/2019 Zio: 2 runs of NSVT up to 12 beats, max rate 187 bpm.  . PSVT (paroxysmal supraventricular tachycardia) ([place]   06/2019 Zio: 1820 episodes of SVT lasting up to 00:01:16 w/ max rate of 210.  . Type 2 diabetes mellitus ([place]   Past Surgical History:  Procedure Laterality Date  . COLONOSCOPY    . CORONARY ARTERY BYPASS GRAFT  2019   [facility]; LIMA-LAD, SVG-diagonal, SVG-OM1, and SVG-OM2  . ESOPHAGOGASTRODUODENOSCOPY N/A 04/23/2019   Procedure: ESOPHAGOGASTRODUODENOSCOPY (EGD);  Surgeon: AnJonathon BellowsMD;  [facility];  Service: Gastroenterology;  Laterality: N/A;  . ESOPHAGOGASTRODUODENOSCOPY (EGD) WITH PROPOFOL N/A 04/21/2019   Procedure: ESOPHAGOGASTRODUODENOSCOPY (EGD) WITH PROPOFOL;  Surgeon: AnJonathon BellowsMD;  [facility];  Service: Gastroenterology;  Laterality: N/A;  . LEFT HEART CATH AND CORONARY ANGIOGRAPHY N/A 10/31/2017   Procedure: LEFT HEART CATH AND CORONARY ANGIOGRAPHY;  Surgeon: CaYolonda KidaMD;  [place] [place];  Service: Cardiovascular;  Laterality: N/A;  . REVISION UROSTOMY CUTANEOUS      Current Meds  Medication Sig  . aspirin EC 81 MG tablet Take 81 mg by mouth every evening.   . Marland Kitchentorvastatin (LIPITOR) 80 MG tablet Take 80 mg by mouth at bedtime.  . blood glucose meter kit and supplies KIT Dispense based on patient and insurance preference. Use up to four times daily as directed. (FOR ICD-9 250.00, 250.01).  . furosemide (LASIX) 20 MG tablet Take 1 tablet (20 mg total) by mouth daily.  .Marland Kitchen  glipiZIDE (GLUCOTROL) 5 MG tablet Take 0.5 tablets (2.5 mg total) by mouth daily before breakfast.  . ipratropium-albuterol (DUONEB) 0.5-2.5 (3) MG/3ML SOLN Take 3 mLs by nebulization every 6 (six) hours as needed (wheezing, shortness of breath).  . metoprolol tartrate (LOPRESSOR) 25 MG tablet Take 1 tablet (25 mg total) by mouth 2 (two) times daily.  .  mometasone-formoterol (DULERA) 200-5 MCG/ACT AERO Inhale 2 puffs into the lungs 2 (two) times daily.  . pantoprazole (PROTONIX) 40 MG tablet Take 1 tablet (40 mg total) by mouth daily.  . sodium bicarbonate 650 MG tablet Take 1,300 mg by mouth 3 (three) times daily.  . vancomycin (VANCOCIN) 125 MG capsule Take 3 caps daily for 2 weeks, reduce to 2 caps daily for 2 weeks, reduce to 1 cap daily for 2 weeks    Allergies: Patient has no known allergies.  Social History   Tobacco Use  . Smoking status: Former Smoker    Packs/day: 1.50    Years: 35.00    Pack years: 52.50    Types: Cigarettes    Quit date: 2013    Years since quitting: 8.0  . Smokeless tobacco: Never Used  Substance Use Topics  . Alcohol use: No  . Drug use: Never    Family History  Problem Relation Age of Onset  . CAD Mother   . Stroke Mother   . CAD Father   . Stroke Father   . Breast cancer Sister     Review of Systems: A 12-system review of systems was performed and was negative except as noted in the HPI.  --------------------------------------------------------------------------------------------------  Physical Exam: BP (!) 106/50 (BP Location: Right Arm, Patient Position: Sitting, Cuff Size: Normal)   Pulse 67   Ht _0  (1.702 m)   Wt 150 lb 8 oz (68.3 kg)   SpO2 92%   BMI 23.57 kg/m   General: NAD.  Accompanied by his wife. HEENT: No conjunctival pallor or scleral icterus. Facemask in place. Neck: Supple without lymphadenopathy, thyromegaly, JVD, or HJR. Lungs: Normal work of breathing.  Scattered rhonchi.  No wheezes or crackles. Heart: Regular rate and rhythm without murmurs, rubs, or gallops. Non-displaced PMI. Abd: Bowel sounds present. Soft, NT/ND without hepatosplenomegaly Ext: Trace ankle edema bilaterally. Skin: Warm and dry without rash.  EKG: Normal sinus rhythm with PACs and septal Q waves.  Septal Q waves are new, which may reflect lead placement.  Otherwise, no significant  change from 07/01/2019.  Lab Results  Component Value Date   WBC 11.9 (H) 07/07/2019   HGB 10.0 (L) 07/07/2019   HCT 33.8 (L) 07/07/2019   MCV 93.1 07/07/2019   PLT 320 07/07/2019    Lab Results  Component Value Date   NA 139 07/09/2019   K 4.4 07/09/2019   CL 112 (H) 07/09/2019   CO2 17 (L) 07/09/2019   BUN 75 (H) 07/09/2019   CREATININE 3.13 (H) 07/09/2019   GLUCOSE 114 (H) 07/09/2019   ALT 12 06/02/2019    No results found for: CHOL, HDL, LDLCALC, LDLDIRECT, TRIG, CHOLHDL  --------------------------------------------------------------------------------------------------  ASSESSMENT AND PLAN: Chronic HFpEF: [name] has trace ankle edema, though overall leg swelling is significantly improved compared with earlier in the fall.  I think it is reasonable to continue with furosemide 20 mg daily.  I will check a BMP today in the setting of chronic kidney disease and gradual worsening of creatinine on last check earlier this month.  Coronary artery disease: No signs or symptoms to suggest worsening  coronary insufficiency.  Continue current medications for secondary prevention.  LDL at goal on last check through PCP in July.  PSVT: Multiple episodes of SVT noted on event monitor, lasting up to just over a minute.  Blood pressure has improved from his most recent visit with [place], though it remains soft.  I am reluctant to escalate metoprolol further today.  I will recheck a BMP today to ensure stable potassium.  Chronic kidney disease stage IV: [name] appears grossly euvolemic other than trace pedal edema.  I will recheck a BMP today, given slight worsening of renal function prompting de-escalation of furosemide after his last visit in late November.  Follow-up: Return to clinic in 6 weeks.  Nelva Bush, MD 08/06/2019 3:07 PM

## 2019-08-06 NOTE — Patient Instructions (Signed)
Medication Instructions:  Your physician recommends that you continue on your current medications as directed. Please refer to the Current Medication list given to you today.  *If you need a refill on your cardiac medications before your next appointment, please call your pharmacy*  Lab Work: Your physician recommends that you return for lab work in: [place].   If you have labs (blood work) drawn today and your tests are completely normal, you will receive your results only by: Marland Kitchen MyChart Message (if you have MyChart) OR . A paper copy in the mail If you have any lab test that is abnormal or we need to change your treatment, we will call you to review the results.  Testing/Procedures: none  Follow-Up: At [facility], you and your health needs are our priority.  As part of our continuing mission to provide you with exceptional heart care, we have created designated Provider Care Teams.  These Care Teams include your primary Cardiologist (physician) and Advanced Practice Providers (APPs -  Physician Assistants and Nurse Practitioners) who all work together to provide you with the care you need, when you need it.  Your next appointment:   6 week(s)  The format for your next appointment:   In Person  Provider:    You may see Nelva Bush, MD or one of the following Advanced Practice Providers on your designated Care Team:    Murray Hodgkins, NP  Christell Faith, PA-C  Marrianne Mood, PA-C

## 2019-08-07 LAB — BASIC METABOLIC PANEL
BUN/Creatinine Ratio: 17 (ref 10–24)
BUN: 48 mg/dL — ABNORMAL HIGH (ref 8–27)
CO2: 18 mmol/L — ABNORMAL LOW (ref 20–29)
Calcium: 8.5 mg/dL — ABNORMAL LOW (ref 8.6–10.2)
Chloride: 107 mmol/L — ABNORMAL HIGH (ref 96–106)
Creatinine, Ser: 2.82 mg/dL — ABNORMAL HIGH (ref 0.76–1.27)
GFR calc Af Amer: 24 mL/min/{1.73_m2} — ABNORMAL LOW (ref >59)
GFR calc non Af Amer: 20 mL/min/{1.73_m2} — ABNORMAL LOW (ref >59)
Glucose: 166 mg/dL — ABNORMAL HIGH (ref 65–99)
Potassium: 4.1 mmol/L (ref 3.5–5.2)
Sodium: 138 mmol/L (ref 134–144)

## 2019-08-12 DIAGNOSIS — R600 Localized edema: Secondary | ICD-10-CM | POA: Diagnosis not present

## 2019-08-12 DIAGNOSIS — N184 Chronic kidney disease, stage 4 (severe): Secondary | ICD-10-CM | POA: Diagnosis not present

## 2019-08-12 DIAGNOSIS — I272 Pulmonary hypertension, unspecified: Secondary | ICD-10-CM | POA: Diagnosis not present

## 2019-08-12 DIAGNOSIS — N1339 Other hydronephrosis: Secondary | ICD-10-CM | POA: Diagnosis not present

## 2019-08-12 DIAGNOSIS — I5022 Chronic systolic (congestive) heart failure: Secondary | ICD-10-CM | POA: Diagnosis not present

## 2019-08-12 DIAGNOSIS — E1129 Type 2 diabetes mellitus with other diabetic kidney complication: Secondary | ICD-10-CM | POA: Diagnosis not present

## 2019-08-31 DIAGNOSIS — S2242XA Multiple fractures of ribs, left side, initial encounter for closed fracture: Secondary | ICD-10-CM | POA: Diagnosis not present

## 2019-09-07 DIAGNOSIS — I714 Abdominal aortic aneurysm, without rupture, unspecified: Secondary | ICD-10-CM

## 2019-09-07 HISTORY — DX: Abdominal aortic aneurysm, without rupture: I71.4

## 2019-09-07 HISTORY — DX: Abdominal aortic aneurysm, without rupture, unspecified: I71.40

## 2019-09-08 ENCOUNTER — Other Ambulatory Visit: Payer: Self-pay

## 2019-09-08 MED ORDER — FUROSEMIDE 20 MG PO TABS: 20.0000 mg | ORAL_TABLET | Freq: Every day | ORAL | 2 refills | Status: DC

## 2019-09-09 ENCOUNTER — Telehealth: Payer: Self-pay | Admitting: Oncology

## 2019-09-09 NOTE — Telephone Encounter (Signed)
Dr. Janese Banks will not be in the office on 10-15-19. Appt was moved to 10-13-19. Patient is aware.

## 2019-09-15 DIAGNOSIS — Z8551 Personal history of malignant neoplasm of bladder: Secondary | ICD-10-CM | POA: Diagnosis not present

## 2019-09-15 DIAGNOSIS — I10 Essential (primary) hypertension: Secondary | ICD-10-CM | POA: Diagnosis not present

## 2019-09-15 DIAGNOSIS — E1122 Type 2 diabetes mellitus with diabetic chronic kidney disease: Secondary | ICD-10-CM | POA: Diagnosis not present

## 2019-09-15 DIAGNOSIS — E538 Deficiency of other specified B group vitamins: Secondary | ICD-10-CM | POA: Diagnosis not present

## 2019-09-15 DIAGNOSIS — N184 Chronic kidney disease, stage 4 (severe): Secondary | ICD-10-CM | POA: Diagnosis not present

## 2019-09-15 DIAGNOSIS — E7800 Pure hypercholesterolemia, unspecified: Secondary | ICD-10-CM | POA: Diagnosis not present

## 2019-09-15 DIAGNOSIS — E782 Mixed hyperlipidemia: Secondary | ICD-10-CM | POA: Diagnosis not present

## 2019-09-15 DIAGNOSIS — D649 Anemia, unspecified: Secondary | ICD-10-CM | POA: Diagnosis not present

## 2019-09-15 DIAGNOSIS — Z0000 Encounter for general adult medical examination without abnormal findings: Secondary | ICD-10-CM | POA: Diagnosis not present

## 2019-09-16 ENCOUNTER — Ambulatory Visit: Payer: Medicare Other | Admitting: Internal Medicine

## 2019-09-16 DIAGNOSIS — N184 Chronic kidney disease, stage 4 (severe): Secondary | ICD-10-CM | POA: Diagnosis not present

## 2019-09-16 DIAGNOSIS — I2510 Atherosclerotic heart disease of native coronary artery without angina pectoris: Secondary | ICD-10-CM | POA: Diagnosis not present

## 2019-09-16 DIAGNOSIS — K439 Ventral hernia without obstruction or gangrene: Secondary | ICD-10-CM | POA: Diagnosis not present

## 2019-09-16 DIAGNOSIS — E538 Deficiency of other specified B group vitamins: Secondary | ICD-10-CM | POA: Diagnosis not present

## 2019-09-16 DIAGNOSIS — Z0000 Encounter for general adult medical examination without abnormal findings: Secondary | ICD-10-CM | POA: Diagnosis not present

## 2019-09-16 DIAGNOSIS — M1711 Unilateral primary osteoarthritis, right knee: Secondary | ICD-10-CM | POA: Diagnosis not present

## 2019-09-16 DIAGNOSIS — D649 Anemia, unspecified: Secondary | ICD-10-CM | POA: Diagnosis not present

## 2019-09-16 DIAGNOSIS — E782 Mixed hyperlipidemia: Secondary | ICD-10-CM | POA: Diagnosis not present

## 2019-09-16 DIAGNOSIS — I10 Essential (primary) hypertension: Secondary | ICD-10-CM | POA: Diagnosis not present

## 2019-09-16 DIAGNOSIS — R195 Other fecal abnormalities: Secondary | ICD-10-CM | POA: Diagnosis not present

## 2019-09-16 DIAGNOSIS — Z8551 Personal history of malignant neoplasm of bladder: Secondary | ICD-10-CM | POA: Diagnosis not present

## 2019-09-16 DIAGNOSIS — R8290 Unspecified abnormal findings in urine: Secondary | ICD-10-CM | POA: Diagnosis not present

## 2019-09-16 DIAGNOSIS — E1122 Type 2 diabetes mellitus with diabetic chronic kidney disease: Secondary | ICD-10-CM | POA: Diagnosis not present

## 2019-09-16 DIAGNOSIS — E7800 Pure hypercholesterolemia, unspecified: Secondary | ICD-10-CM | POA: Diagnosis not present

## 2019-09-16 DIAGNOSIS — J449 Chronic obstructive pulmonary disease, unspecified: Secondary | ICD-10-CM | POA: Diagnosis not present

## 2019-09-16 DIAGNOSIS — R140 Abdominal distension (gaseous): Secondary | ICD-10-CM | POA: Diagnosis not present

## 2019-09-17 ENCOUNTER — Other Ambulatory Visit: Payer: Self-pay | Admitting: Internal Medicine

## 2019-09-17 DIAGNOSIS — R140 Abdominal distension (gaseous): Secondary | ICD-10-CM

## 2019-09-18 ENCOUNTER — Encounter: Payer: Self-pay | Admitting: Internal Medicine

## 2019-09-18 ENCOUNTER — Ambulatory Visit (INDEPENDENT_AMBULATORY_CARE_PROVIDER_SITE_OTHER): Payer: Medicare HMO | Admitting: Internal Medicine

## 2019-09-18 ENCOUNTER — Other Ambulatory Visit: Payer: Self-pay

## 2019-09-18 VITALS — BP 116/60 | HR 76 | Ht 67.0 in | Wt 147.5 lb

## 2019-09-18 DIAGNOSIS — R109 Unspecified abdominal pain: Secondary | ICD-10-CM

## 2019-09-18 DIAGNOSIS — N184 Chronic kidney disease, stage 4 (severe): Secondary | ICD-10-CM

## 2019-09-18 DIAGNOSIS — I2510 Atherosclerotic heart disease of native coronary artery without angina pectoris: Secondary | ICD-10-CM | POA: Diagnosis not present

## 2019-09-18 DIAGNOSIS — R195 Other fecal abnormalities: Secondary | ICD-10-CM | POA: Diagnosis not present

## 2019-09-18 DIAGNOSIS — I5032 Chronic diastolic (congestive) heart failure: Secondary | ICD-10-CM | POA: Diagnosis not present

## 2019-09-18 DIAGNOSIS — R194 Change in bowel habit: Secondary | ICD-10-CM | POA: Diagnosis not present

## 2019-09-18 NOTE — Patient Instructions (Signed)
Medication Instructions:  Your physician recommends that you continue on your current medications as directed. Please refer to the Current Medication list given to you today.  *If you need a refill on your cardiac medications before your next appointment, please call your pharmacy*  Lab Work: none If you have labs (blood work) drawn today and your tests are completely normal, you will receive your results only by: Marland Kitchen MyChart Message (if you have MyChart) OR . A paper copy in the mail If you have any lab test that is abnormal or we need to change your treatment, we will call you to review the results.  Testing/Procedures: none  Follow-Up: At [facility], you and your health needs are our priority.  As part of our continuing mission to provide you with exceptional heart care, we have created designated Provider Care Teams.  These Care Teams include your primary Cardiologist (physician) and Advanced Practice Providers (APPs -  Physician Assistants and Nurse Practitioners) who all work together to provide you with the care you need, when you need it.  Your next appointment:   3 month(s)  The format for your next appointment:   In Person  Provider:    You may see Nelva Bush, MD or one of the following Advanced Practice Providers on your designated Care Team:    Murray Hodgkins, NP  Christell Faith, PA-C  Marrianne Mood, PA-C

## 2019-09-18 NOTE — Progress Notes (Signed)
Follow-up Outpatient Visit Date: 09/18/2019  Primary Care Provider: Tracie Harrier, [place] [address]  Chief Complaint: Abdominal pain  HPI:  Bryan Brewer is a 79 y.o. male with history of CAD s/p CABG (2019 at [facility]; LIMA-LAD, SVG-diagonal, SVG-OM1, and SVG-OM2), HTN, HLD, DM2, COPD, CKD stage 4, and bladder cancer, who presents for follow-up of coronary artery disease and HFpEF.  I last saw him in 07/2019 at which time he was feeling well other than minimal leg edema.  He noted cessation of diarrhea, having almost completed oral vancomycin for treatment of C. difficile colitis.  We agreed to continue furosemide 20 mg daily for management of his chronic leg edema in the setting of HFpEF.  Today, Bryan Brewer reports he is doing well from a heart standpoint.  He has not had any chest pain, shortness of breath, palpitations, lightheadedness, or significant edema.  He is continuing to take furosemide 20 mg daily.  His oral intake has returned to baseline.  His only complaint is of intermittent lower abdominal pain surrounding his urostomy.  He is scheduled for a CT of the abdomen and pelvis later this month.   --------------------------------------------------------------------------------------------------  Past Medical History:  Diagnosis Date  . (HFpEF) heart failure with preserved ejection fraction ([place])    a. 03/2019 Echo: EF 60-65%, nl RV fxn.  . Bladder cancer ([place])   . C. difficile colitis 04/2019  . CKD (chronic kidney disease), stage IV ([place])   . COPD (chronic obstructive pulmonary disease) ([place])   . Coronary artery disease    a. 10/2017 NSTEMI/Cath: LM 25m 75d, LAD 75ost, 90p, D1 100, LCX 713m, OM1 90, RCA 25p; b. 10/2017 CABG x 4 ([facility]): LIMA->LAD, VG->Diag, VG->OM1, VG->OM2.  . Hyperlipidemia   . Hypertension   . [place] ([place]ustained [place] tachycardia) [place]   a. 06/2019 [facility]: 2 runs of NSVT up to 12 beats, max rate 187 bpm.    . PSVT (paroxysmal supraventricular tachycardia) ([facility] [place]   06/2019 [facility]: 1820 episodes of SVT lasting up to 00:01:16 w/ max rate of 210.  . Type 2 diabetes mellitus ([place]   Past Surgical History:  Procedure Laterality Date  . COLONOSCOPY    . CORONARY ARTERY BYPASS GRAFT  2019   [facility]; LIMA-LAD, SVG-diagonal, SVG-OM1, and SVG-OM2  . ESOPHAGOGASTRODUODENOSCOPY N/A 04/23/2019   Procedure: ESOPHAGOGASTRODUODENOSCOPY (EGD);  Surgeon: AnJonathon BellowsMD;  [facility];  Service: Gastroenterology;  Laterality: N/A;  . ESOPHAGOGASTRODUODENOSCOPY (EGD) WITH PROPOFOL N/A 04/21/2019   Procedure: ESOPHAGOGASTRODUODENOSCOPY (EGD) WITH PROPOFOL;  Surgeon: AnJonathon BellowsMD;  [facility];  Service: Gastroenterology;  Laterality: N/A;  . LEFT HEART CATH AND CORONARY ANGIOGRAPHY N/A 10/31/2017   Procedure: LEFT HEART CATH AND CORONARY ANGIOGRAPHY;  Surgeon: CaYolonda KidaMD;  [facility];  Service: Cardiovascular;  Laterality: N/A;  . REVISION UROSTOMY CUTANEOUS     Current Meds  Medication Sig  . aspirin EC 81 MG tablet Take 81 mg by mouth every evening.   . Marland Kitchentorvastatin (LIPITOR) 80 MG tablet Take 80 mg by mouth at bedtime.  . blood glucose meter kit and supplies KIT Dispense based on patient and insurance preference. Use up to four times daily as directed. (FOR ICD-9 250.00, 250.01).  . furosemide (LASIX) 20 MG tablet Take 1 tablet (20 mg total) by mouth daily.  . Marland KitchenlipiZIDE (GLUCOTROL) 5 MG tablet Take 0.5 tablets (2.5 mg total) by mouth daily before breakfast.  . ipratropium-albuterol (DUONEB)  0.5-2.5 (3) MG/3ML SOLN Take 3 mLs by nebulization every 6 (six) hours as needed (wheezing, shortness of breath).  . metoprolol tartrate (LOPRESSOR) 25 MG tablet Take 1 tablet (25 mg total) by mouth 2 (two) times daily.  . mometasone-formoterol (DULERA) 200-5 MCG/ACT AERO Inhale 2 puffs into the lungs 2 (two) times daily.  . pantoprazole (PROTONIX) 40 MG tablet Take 1  tablet (40 mg total) by mouth daily.  . sodium bicarbonate 650 MG tablet Take 1,300 mg by mouth 3 (three) times daily.    Allergies: Patient has no known allergies.  Social History   Tobacco Use  . Smoking status: Former Smoker    Packs/day: 1.50    Years: 35.00    Pack years: 52.50    Types: Cigarettes    Quit date: 2013    Years since quitting: 8.1  . Smokeless tobacco: Never Used  Substance Use Topics  . Alcohol use: No  . Drug use: Never    Family History  Problem Relation Age of Onset  . CAD Mother   . Stroke Mother   . CAD Father   . Stroke Father   . Breast cancer Sister     Review of Systems: A 12-system review of systems was performed and was negative except as noted in the HPI.  --------------------------------------------------------------------------------------------------  Physical Exam: BP 116/60 (BP Location: Right Arm, Patient Position: Sitting, Cuff Size: Normal)   Pulse 76   Ht _0  (1.702 m)   Wt 147 lb 8 oz (66.9 kg)   SpO2 97%   BMI 23.10 kg/m   General: NAD.  Accompanied by his wife. Neck: No JVD or HJR. Lungs: Normal work of breathing. Clear to auscultation bilaterally without wheezes or crackles. Heart: Regular rate and rhythm without murmurs, rubs, or gallops. Abdomen: Soft with mild left lower quadrant tenderness surrounding urostomy.  No rebound or guarding. Ext: No lower extremity edema.  1+ chronic appearing edema in both calves. Skin: Multiple excoriations on both calves.  EKG: Sinus rhythm with borderline LVH and nonspecific ST segment changes.  No significant change from prior tracing on 08/06/2019.  Lab Results  Component Value Date   WBC 11.9 (H) 07/07/2019   HGB 10.0 (L) 07/07/2019   HCT 33.8 (L) 07/07/2019   MCV 93.1 07/07/2019   PLT 320 07/07/2019    Lab Results  Component Value Date   NA 138 08/06/2019   K 4.1 08/06/2019   CL 107 (H) 08/06/2019   CO2 18 (L) 08/06/2019   BUN 48 (H) 08/06/2019   CREATININE  2.82 (H) 08/06/2019   GLUCOSE 166 (H) 08/06/2019   ALT 12 06/02/2019    No results found for: CHOL, HDL, LDLCALC, LDLDIRECT, TRIG, CHOLHDL  --------------------------------------------------------------------------------------------------  ASSESSMENT AND PLAN: Chronic HFpEF: Bryan Brewer appears mildly volume overloaded with 1+ edema in both legs.  He actually reports that his swelling has been minimal.  I advised him to elevate his legs when possible and to take an additional 20 mg of furosemide if he has worsening edema or begins to gain weight.  Coronary artery disease: No signs or symptoms of worsening coronary insufficiency status post CABG in 2019.  Continue current medications for secondary prevention.  LDL at goal on last check by Dr. Ginette Pitman.  Abdominal pain: CT scan planned for later this month per Dr. Ginette Pitman.  I will defer ongoing management to him.  I advised Bryan Brewer to seek immediate medical attention should he have worsening abdominal pain, nausea/vomiting, or quit passing gas.  Chronic kidney disease stage IV: Creatinine stable on recent labs by Dr. Ginette Pitman.  We will need to keep a close eye on Bryan Brewer volume status.  He should continue to follow-up with nephrology and avoid nephrotoxic drugs.  Nelva Bush, MD 09/18/2019 4:06 PM

## 2019-09-28 ENCOUNTER — Ambulatory Visit
Admission: RE | Admit: 2019-09-28 | Discharge: 2019-09-28 | Disposition: A | Discharge status: Home / Self Care | Payer: Medicare HMO | Source: Ambulatory Visit | Attending: Internal Medicine | Admitting: Internal Medicine

## 2019-09-28 ENCOUNTER — Other Ambulatory Visit: Payer: Self-pay

## 2019-09-28 DIAGNOSIS — Z9079 Acquired absence of other genital organ(s): Secondary | ICD-10-CM | POA: Diagnosis not present

## 2019-09-28 DIAGNOSIS — I700 Atherosclerosis of aorta: Secondary | ICD-10-CM | POA: Diagnosis not present

## 2019-09-28 DIAGNOSIS — K435 Parastomal hernia without obstruction or  gangrene: Secondary | ICD-10-CM | POA: Insufficient documentation

## 2019-09-28 DIAGNOSIS — R140 Abdominal distension (gaseous): Secondary | ICD-10-CM | POA: Insufficient documentation

## 2019-09-28 DIAGNOSIS — I714 Abdominal aortic aneurysm, without rupture: Secondary | ICD-10-CM | POA: Diagnosis not present

## 2019-10-06 ENCOUNTER — Other Ambulatory Visit: Payer: Self-pay | Admitting: Internal Medicine

## 2019-10-06 DIAGNOSIS — N1330 Unspecified hydronephrosis: Secondary | ICD-10-CM

## 2019-10-06 DIAGNOSIS — R590 Localized enlarged lymph nodes: Secondary | ICD-10-CM

## 2019-10-13 ENCOUNTER — Telehealth: Payer: Self-pay | Admitting: Oncology

## 2019-10-13 ENCOUNTER — Inpatient Hospital Stay: Payer: Medicare HMO | Admitting: Oncology

## 2019-10-13 ENCOUNTER — Inpatient Hospital Stay: Payer: Medicare HMO

## 2019-10-13 ENCOUNTER — Other Ambulatory Visit: Payer: Self-pay

## 2019-10-13 NOTE — Telephone Encounter (Signed)
Patient miss appts on 10-13-19. Writer phoned patient and rescheduled appts for 10-20-19.

## 2019-10-15 ENCOUNTER — Other Ambulatory Visit: Payer: Medicare Other

## 2019-10-15 ENCOUNTER — Other Ambulatory Visit: Payer: Self-pay

## 2019-10-15 ENCOUNTER — Encounter
Admission: RE | Admit: 2019-10-15 | Discharge: 2019-10-15 | Disposition: A | Discharge status: Home / Self Care | Payer: Medicare HMO | Source: Ambulatory Visit | Attending: Internal Medicine | Admitting: Internal Medicine

## 2019-10-15 ENCOUNTER — Ambulatory Visit: Payer: Medicare Other | Admitting: Oncology

## 2019-10-15 DIAGNOSIS — I700 Atherosclerosis of aorta: Secondary | ICD-10-CM | POA: Insufficient documentation

## 2019-10-15 DIAGNOSIS — I2510 Atherosclerotic heart disease of native coronary artery without angina pectoris: Secondary | ICD-10-CM | POA: Insufficient documentation

## 2019-10-15 DIAGNOSIS — I714 Abdominal aortic aneurysm, without rupture: Secondary | ICD-10-CM | POA: Insufficient documentation

## 2019-10-15 DIAGNOSIS — N1330 Unspecified hydronephrosis: Secondary | ICD-10-CM | POA: Insufficient documentation

## 2019-10-15 DIAGNOSIS — C679 Malignant neoplasm of bladder, unspecified: Secondary | ICD-10-CM | POA: Diagnosis not present

## 2019-10-15 DIAGNOSIS — J320 Chronic maxillary sinusitis: Secondary | ICD-10-CM | POA: Diagnosis not present

## 2019-10-15 DIAGNOSIS — K9419 Other complications of enterostomy: Secondary | ICD-10-CM | POA: Diagnosis not present

## 2019-10-15 DIAGNOSIS — J439 Emphysema, unspecified: Secondary | ICD-10-CM | POA: Diagnosis not present

## 2019-10-15 DIAGNOSIS — R590 Localized enlarged lymph nodes: Secondary | ICD-10-CM | POA: Insufficient documentation

## 2019-10-15 LAB — GLUCOSE, CAPILLARY: Glucose-Capillary: 89 mg/dL (ref 70–99)

## 2019-10-15 MED ORDER — FLUDEOXYGLUCOSE F - 18 (FDG) INJECTION
7.6000 | Freq: Once | INTRAVENOUS | Status: AC | PRN
  Administered 2019-10-15: 8.03 via INTRAVENOUS

## 2019-10-20 ENCOUNTER — Inpatient Hospital Stay (HOSPITAL_BASED_OUTPATIENT_CLINIC_OR_DEPARTMENT_OTHER): Payer: Medicare HMO | Admitting: Oncology

## 2019-10-20 ENCOUNTER — Telehealth: Payer: Self-pay

## 2019-10-20 ENCOUNTER — Encounter: Payer: Self-pay | Admitting: Oncology

## 2019-10-20 ENCOUNTER — Inpatient Hospital Stay: Payer: Medicare HMO | Attending: Oncology

## 2019-10-20 ENCOUNTER — Other Ambulatory Visit: Payer: Self-pay

## 2019-10-20 VITALS — BP 137/71 | HR 70 | Temp 94.3°F | Ht 67.0 in | Wt 141.0 lb

## 2019-10-20 DIAGNOSIS — Z87891 Personal history of nicotine dependence: Secondary | ICD-10-CM | POA: Insufficient documentation

## 2019-10-20 DIAGNOSIS — Z7984 Long term (current) use of oral hypoglycemic drugs: Secondary | ICD-10-CM | POA: Insufficient documentation

## 2019-10-20 DIAGNOSIS — N184 Chronic kidney disease, stage 4 (severe): Secondary | ICD-10-CM | POA: Diagnosis not present

## 2019-10-20 DIAGNOSIS — I2510 Atherosclerotic heart disease of native coronary artery without angina pectoris: Secondary | ICD-10-CM | POA: Diagnosis not present

## 2019-10-20 DIAGNOSIS — I130 Hypertensive heart and chronic kidney disease with heart failure and stage 1 through stage 4 chronic kidney disease, or unspecified chronic kidney disease: Secondary | ICD-10-CM | POA: Diagnosis not present

## 2019-10-20 DIAGNOSIS — D638 Anemia in other chronic diseases classified elsewhere: Secondary | ICD-10-CM | POA: Diagnosis not present

## 2019-10-20 DIAGNOSIS — E1122 Type 2 diabetes mellitus with diabetic chronic kidney disease: Secondary | ICD-10-CM | POA: Diagnosis not present

## 2019-10-20 DIAGNOSIS — Z8551 Personal history of malignant neoplasm of bladder: Secondary | ICD-10-CM | POA: Diagnosis not present

## 2019-10-20 DIAGNOSIS — D509 Iron deficiency anemia, unspecified: Secondary | ICD-10-CM

## 2019-10-20 LAB — CBC WITH DIFFERENTIAL/PLATELET
Abs Immature Granulocytes: 0.03 10*3/uL (ref 0.00–0.07)
Basophils Absolute: 0.1 10*3/uL (ref 0.0–0.1)
Basophils Relative: 1 %
Eosinophils Absolute: 0.2 10*3/uL (ref 0.0–0.5)
Eosinophils Relative: 3 %
HCT: 35.2 % — ABNORMAL LOW (ref 39.0–52.0)
Hemoglobin: 11 g/dL — ABNORMAL LOW (ref 13.0–17.0)
Immature Granulocytes: 0 %
Lymphocytes Relative: 24 %
Lymphs Abs: 1.8 10*3/uL (ref 0.7–4.0)
MCH: 27.6 pg (ref 26.0–34.0)
MCHC: 31.3 g/dL (ref 30.0–36.0)
MCV: 88.4 fL (ref 80.0–100.0)
Monocytes Absolute: 0.9 10*3/uL (ref 0.1–1.0)
Monocytes Relative: 11 %
Neutro Abs: 4.6 10*3/uL (ref 1.7–7.7)
Neutrophils Relative %: 61 %
Platelets: 293 10*3/uL (ref 150–400)
RBC: 3.98 MIL/uL — ABNORMAL LOW (ref 4.22–5.81)
RDW: 15.4 % (ref 11.5–15.5)
WBC: 7.6 10*3/uL (ref 4.0–10.5)
nRBC: 0 % (ref 0.0–0.2)

## 2019-10-20 LAB — IRON AND TIBC
Iron: 41 ug/dL — ABNORMAL LOW (ref 45–182)
Saturation Ratios: 20 % (ref 17.9–39.5)
TIBC: 203 ug/dL — ABNORMAL LOW (ref 250–450)
UIBC: 162 ug/dL

## 2019-10-20 LAB — FERRITIN: Ferritin: 331 ng/mL (ref 24–336)

## 2019-10-20 NOTE — Telephone Encounter (Signed)
Contacted pt's pcp office and spoke with the referral coordinator, Marlowe Kays, I informed her that Dr. Vicente Males reviewed pt's recent PET scan ordered by pt's pcp and saw that pt has Diverticulitis with contained perforation and that pt's pcp referred him to [place]. Dr. Vicente Males states pt needs an urgent referral to see a surgeon, not GI. I explained this to [place] and she has forwarded this information to Dr. Linton Ham nurse so that this can be corrected.

## 2019-10-20 NOTE — Progress Notes (Signed)
Patient stated that he had been doing well overall. However, patient stated that he has low back pain.

## 2019-10-23 NOTE — Progress Notes (Signed)
Hematology/Oncology Consult note [facility]  [phone] [phone]  Patient Care Team: Tracie Harrier, MD as PCP - General (Internal Medicine) End, Harrell Gave, MD as PCP - Cardiology (Cardiology)   Name of the patient: Bryan Brewer  [id number]  January 16, 1941   Date of visit: 10/23/19  Diagnosis- anemia likely multifactorial secondary to chronic disease as well as iron deficiency  Chief complaint/ Reason for visit- routine f/u of anemia  Heme/Onc history: Patient is a 79 year old male with past medical history significant for bladder cancer, small bowel bacterial overgrowth syndrome and recent episode of C. difficile diarrhea for which he had seen Dr. Vicente Males. He was on oral vancomycin but had his first recurrence and was supposed to take Dificid which she has not started taking yet. He has been referred to [place] for anemia. Most recent CBC from 05/20/2019 showed white count of 13.1, H&H of 9.1/27.8 with an MCV of 89 and a platelet count of 321. Iron study showed a normal ferritin of 231 with a low iron saturation of 11% and TIBC of 308. B12 and folate were normal.  Of note patient has had chronic microcytic anemia over the last 1-1/2-year hemoglobin is fluctuated between8-9.   Results of blood work from 05/26/2019 were as follows: CBC showed white count of 11.9, H&H of 8.8/28.8 and a platelet count of 340.  Reticulocyte count was low at 1.5.  TSH was normal at 1.7.  Serum immunofixation showed IgG kappa but no M protein detected on SPEP.  Both kappa and lambda light chains were elevated with a normal kappa lambda light chain ratio 1.2.  Haptoglobin normal at 349.  Patient was supposed to receive IV iron at that time but had ongoing C. difficile and could not come for his infusion.  Interval history- patient reports occasional loose stools but deies any watery diarrhea. Denies any abdominal pain, blood in stools or fever  ECOG PS- 2 Pain  scale- 0   Review of systems- Review of Systems  Constitutional: Positive for malaise/fatigue. Negative for chills, fever and weight loss.  HENT: Negative for congestion, ear discharge and nosebleeds.   Eyes: Negative for blurred vision.  Respiratory: Negative for cough, hemoptysis, sputum production, shortness of breath and wheezing.   Cardiovascular: Negative for chest pain, palpitations, orthopnea and claudication.  Gastrointestinal: Negative for abdominal pain, blood in stool, constipation, diarrhea, heartburn, melena, nausea and vomiting.  Genitourinary: Negative for dysuria, flank pain, frequency, hematuria and urgency.  Musculoskeletal: Negative for back pain, joint pain and myalgias.  Skin: Negative for rash.  Neurological: Negative for dizziness, tingling, focal weakness, seizures, weakness and headaches.  Endo/Heme/Allergies: Does not bruise/bleed easily.  Psychiatric/Behavioral: Negative for depression and suicidal ideas. The patient does not have insomnia.        No Known Allergies   Past Medical History:  Diagnosis Date  . (HFpEF) heart failure with preserved ejection fraction ([place])    a. 03/2019 Echo: EF 60-65%, nl RV fxn.  . Bladder cancer ([place])   . C. difficile colitis 04/2019  . CKD (chronic kidney disease), stage IV ([place])   . COPD (chronic obstructive pulmonary disease) ([place])   . Coronary artery disease    a. 10/2017 NSTEMI/Cath: LM 34m 75d, LAD 75ost, 90p, D1 100, LCX 728m, OM1 90, RCA 25p; b. 10/2017 CABG x 4 ([facility]): LIMA->LAD, VG->Diag, VG->OM1, VG->OM2.  . Hyperlipidemia   . Hypertension   . NSVT (nonsustained ventricular tachycardia) [place]   a. 06/2019 Zio: 2 runs of  NSVT up to 12 beats, max rate 187 bpm.  . PSVT (paroxysmal supraventricular tachycardia) ([place])    06/2019 Zio: 1820 episodes of SVT lasting up to 00:01:16 w/ max rate of 210.  . Type 2 diabetes mellitus ([place])      Past Surgical History:  Procedure Laterality Date  . COLONOSCOPY    .  CORONARY ARTERY BYPASS GRAFT  2019   [facility]; LIMA-LAD, SVG-diagonal, SVG-OM1, and SVG-OM2  . ESOPHAGOGASTRODUODENOSCOPY N/A 04/23/2019   Procedure: ESOPHAGOGASTRODUODENOSCOPY (EGD);  Surgeon: Jonathon Bellows, MD;  Location[facility] ENDOSCOPY;  Service: Gastroenterology;  Laterality: N/A;  . ESOPHAGOGASTRODUODENOSCOPY (EGD) WITH PROPOFOL N/A 04/21/2019   Procedure: ESOPHAGOGASTRODUODENOSCOPY (EGD) WITH PROPOFOL;  Surgeon: Jonathon Bellows, MD;  Location: [facility] ENDOSCOPY;  Service: Gastroenterology;  Laterality: N/A;  . LEFT HEART CATH AND CORONARY ANGIOGRAPHY N/A 10/31/2017   Procedure: LEFT HEART CATH AND CORONARY ANGIOGRAPHY;  Surgeon: Yolonda Kida, MD;  Location: [facility] LAB;  Service: Cardiovascular;  Laterality: N/A;  . REVISION UROSTOMY CUTANEOUS      Social History   Socioeconomic History  . Marital status: Married    Spouse name: Not on file  . Number of children: Not on file  . Years of education: Not on file  . Highest education level: Not on file  Occupational History  . Not on file  Tobacco Use  . Smoking status: Former Smoker    Packs/day: 1.50    Years: 35.00    Pack years: 52.50    Types: Cigarettes    Quit date: 2013    Years since quitting: 8.2  . Smokeless tobacco: Never Used  Substance and Sexual Activity  . Alcohol use: No  . Drug use: Never  . Sexual activity: Not Currently  Other Topics Concern  . Not on file  Social History Narrative  . Not on file   Social Determinants of Health   Financial Resource Strain: Low Risk   . Difficulty of Paying Living Expenses: Not hard at all  Food Insecurity: Unknown  . Worried About Charity fundraiser in the Last Year: Patient refused  . Ran Out of Food in the Last Year: Patient refused  Transportation Needs: Unknown  . Lack of Transportation (Medical): Patient refused  . Lack of Transportation (Non-Medical): Patient refused  Physical Activity: Unknown  . Days of Exercise per Week: Patient refused  . Minutes of  Exercise per Session: Patient refused  Stress: Unknown  . Feeling of Stress : Patient refused  Social Connections: Unknown  . Frequency of Communication with Friends and Family: Patient refused  . Frequency of Social Gatherings with Friends and Family: Patient refused  . Attends Religious Services: Patient refused  . Active Member of Clubs or Organizations: Patient refused  . Attends Archivist Meetings: Patient refused  . Marital Status: Patient refused  Intimate Partner Violence: Unknown  . Fear of Current or Ex-Partner: Patient refused  . Emotionally Abused: Patient refused  . Physically Abused: Patient refused  . Sexually Abused: Patient refused    Family History  Problem Relation Age of Onset  . CAD Mother   . Stroke Mother   . CAD Father   . Stroke Father   . Breast cancer Sister      Current Outpatient Medications:  .  aspirin EC 81 MG tablet, Take 81 mg by mouth every evening. , Disp: , Rfl:  .  atorvastatin (LIPITOR) 80 MG tablet, Take 80 mg by mouth at bedtime., Disp: , Rfl:  .  blood glucose meter kit and supplies KIT, Dispense based on patient and insurance preference. Use up to four times daily as directed. (FOR ICD-9 250.00, 250.01)., Disp: 1 each, Rfl: 1 .  furosemide (LASIX) 20 MG tablet, Take 1 tablet (20 mg total) by mouth daily., Disp: 30 tablet, Rfl: 2 .  glipiZIDE (GLUCOTROL) 5 MG tablet, Take 0.5 tablets (2.5 mg total) by mouth daily before breakfast., Disp: 15 tablet, Rfl: 1 .  ipratropium-albuterol (DUONEB) 0.5-2.5 (3) MG/3ML SOLN, Take 3 mLs by nebulization every 6 (six) hours as needed (wheezing, shortness of breath)., Disp: 360 mL, Rfl: 1 .  metoprolol tartrate (LOPRESSOR) 25 MG tablet, Take 1 tablet (25 mg total) by mouth 2 (two) times daily., Disp: 180 tablet, Rfl: 3 .  pantoprazole (PROTONIX) 40 MG tablet, Take 1 tablet (40 mg total) by mouth daily., Disp: 30 tablet, Rfl: 11 .  sodium bicarbonate 650 MG tablet, Take 1,300 mg by mouth 3  (three) times daily., Disp: , Rfl:  .  mometasone-formoterol (DULERA) 200-5 MCG/ACT AERO, Inhale 2 puffs into the lungs 2 (two) times daily. (Patient not taking: Reported on 10/20/2019), Disp: 1 g, Rfl: 2  Physical exam:  Vitals:   10/20/19 1039  BP: 137/71  Pulse: 70  Temp: (!) 94.3 F (34.6 C)  TempSrc: Tympanic  SpO2: 100%  Weight: 141 lb (64 kg)  Height: 5' 7"  (1.702 m)   Physical Exam Cardiovascular:     Rate and Rhythm: Normal rate and regular rhythm.     Heart sounds: Normal heart sounds.  Pulmonary:     Effort: Pulmonary effort is normal.     Breath sounds: Normal breath sounds.  Abdominal:     General: There is no distension.     Palpations: Abdomen is soft.     Tenderness: There is no abdominal tenderness. There is no guarding.  Skin:    General: Skin is warm and dry.  Neurological:     Mental Status: He is alert and oriented to person, place, and time.      CMP Latest Ref Rng & Units 08/06/2019  Glucose 65 - 99 mg/dL 166(H)  BUN 8 - 27 mg/dL 48(H)  Creatinine 0.76 - 1.27 mg/dL 2.82(H)  Sodium 134 - 144 mmol/L 138  Potassium 3.5 - 5.2 mmol/L 4.1  Chloride 96 - 106 mmol/L 107(H)  CO2 20 - 29 mmol/L 18(L)  Calcium 8.6 - 10.2 mg/dL 8.5(L)  Total Protein 6.5 - 8.1 g/dL -  Total Bilirubin 0.3 - 1.2 mg/dL -  Alkaline Phos 38 - 126 U/L -  AST 15 - 41 U/L -  ALT 0 - 44 U/L -   CBC Latest Ref Rng & Units 10/20/2019  WBC 4.0 - 10.5 K/uL 7.6  Hemoglobin 13.0 - 17.0 g/dL 11.0(L)  Hematocrit 39.0 - 52.0 % 35.2(L)  Platelets 150 - 400 K/uL 293    No images are attached to the encounter.  CT ABDOMEN PELVIS WO CONTRAST  Result Date: 09/29/2019 CLINICAL DATA:  Abdominal distension, history of bladder cancer, urinary diversion and colostomy EXAM: CT ABDOMEN AND PELVIS WITHOUT CONTRAST TECHNIQUE: Multidetector CT imaging of the abdomen and pelvis was performed following the standard protocol without IV contrast. Oral enteric contrast was administered. COMPARISON:  CT  abdomen pelvis, 03/25/2012, CT chest, 04/01/2019 FINDINGS: Lower chest: No acute abnormality. Coronary artery calcifications. Hepatobiliary: No solid liver abnormality is seen. No gallstones, gallbladder wall thickening, or biliary dilatation. Pancreas: Unremarkable. No pancreatic ductal dilatation or surrounding inflammatory changes. Spleen: Normal in size  without significant abnormality. Adrenals/Urinary Tract: Adrenal glands are unremarkable. Severe bilateral hydronephrosis and hydroureter. Status post cystoprostatectomy and ileal conduit urinary diversion. Large burden of stool in the colon. Sigmoid diverticulosis. Stomach/Bowel: Stomach is within normal limits. Appendix appears normal. Status post ileal conduit urinary diversion. No evidence of bowel wall thickening, distention, or inflammatory changes. Vascular/Lymphatic: Aortic atherosclerosis. Aneurysm of the infrarenal abdominal aorta, measuring up to 3.0 x 2.8 cm. There are enlarged retroperitoneal lymph nodes, largest left periaortic node measuring 1.7 x 1.1 cm (series 2, image 23). Enlarged bilateral inguinal lymph nodes, largest left inguinal nodes measuring 2.2 x 1.3 cm (series 2, image 81). Reproductive: Status post cystoprostatectomy. Other: There is a parastomal hernia about the ileal conduit in the left hemiabdomen containing nonobstructed transverse colon. No abdominopelvic ascites. Musculoskeletal: No acute or significant osseous findings. Unchanged wedge deformity of the T12 vertebral body. IMPRESSION: 1. Status post cystoprostatectomy and ileal conduit urinary diversion with severe bilateral hydronephrosis and hydroureter. 2. There is a parastomal hernia about the ileal conduit in the left hemiabdomen containing nonobstructed transverse colon. 3. Large burden of stool in the colon. 4. Nonspecific enlarged retroperitoneal and bilateral inguinal lymph nodes, new compared to preoperative CT dated 03/25/2012. Comparison to imaging in the interval  may be helpful. If no other imaging is available, PET-CT could be used to assess for metabolic activity and metastatic disease. 5. New infrarenal abdominal aortic aneurysm measuring up to 3.0 cm. Recommend followup by ultrasound in 3 years. This recommendation follows ACR consensus guidelines: White Paper of the ACR Incidental Findings Committee II on Vascular Findings. J Am Coll Radiol 2013; 10:789-794. Aortic aneurysm NOS (ICD10-I71.9) Aortic Atherosclerosis (ICD10-I70.0). Electronically Signed   By: Eddie Candle M.D.   On: 09/29/2019 09:04   NM PET Image Initial (PI) Skull Base To Thigh  Result Date: 10/15/2019 CLINICAL DATA:  Subsequent treatment strategy for bladder cancer status post cysto prostatectomy and ileal conduit urinary diversion and 2013, with nonspecific inguinal and retroperitoneal lymphadenopathy on recent CT. EXAM: NUCLEAR MEDICINE PET SKULL BASE TO THIGH TECHNIQUE: 8.0 mCi F-18 FDG was injected intravenously. Full-ring PET imaging was performed from the skull base to thigh after the radiotracer. CT data was obtained and used for attenuation correction and anatomic localization. Fasting blood glucose: 89 mg/dl COMPARISON:  09/28/2019 CT abdomen/pelvis. FINDINGS: Mediastinal blood pool activity: SUV max 2.0 Liver activity: SUV max NA NECK: No hypermetabolic lymph nodes in the neck. Incidental CT findings: Mucoperiosteal thickening throughout the left maxillary sinus. CHEST: No enlarged or hypermetabolic axillary, mediastinal or hilar lymph nodes. No hypermetabolic pulmonary findings. Incidental CT findings: Coronary atherosclerosis. Atherosclerotic nonaneurysmal thoracic aorta. Moderate centrilobular and paraseptal emphysema. No acute consolidative airspace disease, lung masses or significant pulmonary nodules. Symmetric mild to moderate bilateral gynecomastia. Intact sternotomy wires. ABDOMEN/PELVIS: No abnormal hypermetabolic activity within the liver, pancreas, adrenal glands, or spleen. No  hypermetabolic lymph nodes in the abdomen or pelvis. There is hypermetabolism (max SUV 8.0) associated with segmental wall thickening in the mid to distal sigmoid colon in left deep pelvis with surrounding fat stranding and underlying moderate sigmoid diverticulosis, which appears similar to slightly worsened since 09/28/2019 CT. There is questionable contained free air inferior to the thickened segment of sigmoid colon in close proximity to the symphysis pubis (series 3/image 228), not substantially changed from recent CT. Incidental CT findings: Coarse calcifications associated with small periampullary duodenal diverticulum, unchanged. Status post cysto prostatectomy with ileal conduit urinary diversion to the ventral left lower abdominal wall. Severe left and moderate right  hydroureteronephrosis is unchanged from recent CT abdomen study. Simple 3.0 cm upper right renal cyst. Simple 1.5 cm anterior lower right renal cyst. Atherosclerotic abdominal aorta with 3.1 cm infrarenal abdominal aortic aneurysm. Peristomal hernia in the ventral left abdominal wall contains a portion of the transverse colon without associated transverse colon wall thickening or pericolonic fat stranding. SKELETON: No focal hypermetabolic activity to suggest skeletal metastasis. Incidental CT findings: none IMPRESSION: 1. No hypermetabolic lymphadenopathy or other hypermetabolic findings highly suspicious for metastatic disease. 2. Segmental wall thickening in the mid to distal sigmoid colon with underlying moderate sigmoid diverticulosis with associated hypermetabolism and surrounding fat stranding, similar to slightly worsened since recent 09/28/2019 CT abdomen/pelvis study. Apparent adjacent pericolonic free air near the pubic symphysis. These findings are nonspecific, suggestive of sigmoid diverticulitis with contained perforation. Locally recurrent neoplasm difficult to exclude although less favored. Consider GI/surgical consultation. 3.  Chronic findings include: Aortic Atherosclerosis (ICD10-I70.0) and Emphysema (ICD10-J43.9). Left maxillary sinusitis, chronic appearing. Coronary atherosclerosis. Parastomal ventral left abdominal hernia containing transverse colon. Infrarenal 3.1 cm abdominal aortic aneurysm. Electronically Signed   By: Ilona Sorrel M.D.   On: 10/15/2019 12:35     Assessment and plan- Patient is a 79 y.o. male with anemia of chronic disease and iron deficiency here for routine f/u  Patients wbc is normal. Hemoglobin improved to 11. Platelets are normal. Iron studies are indicative of chronic disease. He does not require IV iron at this time. Will repeat labs in 3 and 6 months and I will see him in 6 months  Patients tells me he had recent pet scan which I was not aware of and saw the results which shows possible sigmoid diverticulitis with contained perforation. I have reached out to Dr. Vicente Males who has seen the patient for C diff colitis in the past. He has reached out to Dr. Ginette Pitman that patient would need urgent surgical consultation for this. On my todays exam, he does not have abdominal pain, distension or tenderness   Visit Diagnosis 1. Iron deficiency anemia, unspecified iron deficiency anemia type      Dr. Randa Evens, MD, MPH [facility] [phone] 10/23/2019 7:52 AM

## 2019-10-28 DIAGNOSIS — Z936 Other artificial openings of urinary tract status: Secondary | ICD-10-CM | POA: Diagnosis not present

## 2019-10-28 DIAGNOSIS — Z8551 Personal history of malignant neoplasm of bladder: Secondary | ICD-10-CM | POA: Diagnosis not present

## 2019-10-30 DIAGNOSIS — I5022 Chronic systolic (congestive) heart failure: Secondary | ICD-10-CM | POA: Diagnosis not present

## 2019-10-30 DIAGNOSIS — N184 Chronic kidney disease, stage 4 (severe): Secondary | ICD-10-CM | POA: Diagnosis not present

## 2019-10-30 DIAGNOSIS — Z8551 Personal history of malignant neoplasm of bladder: Secondary | ICD-10-CM | POA: Diagnosis not present

## 2019-10-30 DIAGNOSIS — E1122 Type 2 diabetes mellitus with diabetic chronic kidney disease: Secondary | ICD-10-CM | POA: Diagnosis not present

## 2019-10-30 DIAGNOSIS — K5730 Diverticulosis of large intestine without perforation or abscess without bleeding: Secondary | ICD-10-CM | POA: Diagnosis not present

## 2019-10-30 DIAGNOSIS — R590 Localized enlarged lymph nodes: Secondary | ICD-10-CM | POA: Diagnosis not present

## 2019-10-30 DIAGNOSIS — K631 Perforation of intestine (nontraumatic): Secondary | ICD-10-CM | POA: Diagnosis not present

## 2019-11-03 NOTE — Progress Notes (Incomplete)
11/04/19 10:18 PM   Bryan Brewer Feb 10, 1941 [id number]  Referring provider: Tracie Harrier, [place] [facility] [place],  [place] [address]  No chief complaint on file.   HPI: 79 y.o. male who presents today for the evaluation and management of hydronephrosis and hydroureter.   -s/p cystoprostatectomy with ileal conduit on 07/08/12, pT1N0Mx  -CT     1. ***  *** 2. *** *** 3. *** ***   PMH: Past Medical History:  Diagnosis Date  . (HFpEF) heart failure with preserved ejection fraction ([place])    a. 03/2019 Echo: EF 60-65%, nl RV fxn.  . Bladder cancer ([place])   . C. difficile colitis 04/2019  . CKD (chronic kidney disease), stage IV ([place])   . COPD (chronic obstructive pulmonary disease) ([place])   . Coronary artery disease    a. 10/2017 NSTEMI/Cath: LM 63m 75d, LAD 75ost, 90p, D1 100, LCX 732m, OM1 90, RCA 25p; b. 10/2017 CABG x 4 ([facility]): LIMA->LAD, VG->Diag, VG->OM1, VG->OM2.  . Hyperlipidemia   . Hypertension   . NSVT (nonsustained ventricular tachycardia) ([place]   a. 06/2019 Zio: 2 runs of NSVT up to 12 beats, max rate 187 bpm.  . PSVT (paroxysmal supraventricular tachycardia) ([place]   06/2019 Zio: 1820 episodes of SVT lasting up to 00:01:16 w/ max rate of 210.  . Type 2 diabetes mellitus ([place]    Surgical History: Past Surgical History:  Procedure Laterality Date  . COLONOSCOPY    . CORONARY ARTERY BYPASS GRAFT  2019   [facility]; LIMA-LAD, SVG-diagonal, SVG-OM1, and SVG-OM2  . ESOPHAGOGASTRODUODENOSCOPY N/A 04/23/2019   Procedure: ESOPHAGOGASTRODUODENOSCOPY (EGD);  Surgeon: AnJonathon BellowsMD;  [facility];  Service: Gastroenterology;  Laterality: N/A;  . ESOPHAGOGASTRODUODENOSCOPY (EGD) WITH PROPOFOL N/A 04/21/2019   Procedure: ESOPHAGOGASTRODUODENOSCOPY (EGD) WITH PROPOFOL;  Surgeon: AnJonathon BellowsMD;  [facility];  Service: Gastroenterology;  Laterality: N/A;  . LEFT HEART CATH AND CORONARY ANGIOGRAPHY N/A 10/31/2017   Procedure: LEFT HEART CATH AND CORONARY ANGIOGRAPHY;  Surgeon: CaYolonda KidaMD;  [place];  Service: Cardiovascular;  Laterality: N/A;  . REVISION UROSTOMY CUTANEOUS      Home Medications:  Allergies as of 11/04/2019   No Known Allergies     Medication List       Accurate as of November 03, 2019 10:18 PM. If you have any questions, ask your nurse or doctor.        aspirin EC 81 MG tablet Take 81 mg by mouth every evening.   atorvastatin 80 MG tablet Commonly known as: LIPITOR Take 80 mg by mouth at bedtime.   blood glucose meter kit and supplies Kit Dispense based on patient and insurance preference. Use up to four times daily as directed. (FOR ICD-9 250.00, 250.01).   Dulera 200-5 MCG/ACT Aero Generic drug: mometasone-formoterol Inhale 2 puffs into the lungs 2 (two) times daily.   furosemide 20 MG tablet Commonly known as: LASIX Take 1 tablet (20 mg total) by mouth daily.   glipiZIDE 5 MG tablet Commonly known as: GLUCOTROL Take 0.5 tablets (2.5 mg total) by mouth daily before breakfast.   ipratropium-albuterol 0.5-2.5 (3) MG/3ML Soln Commonly known as: DUONEB Take 3 mLs by nebulization every 6 (six) hours as needed (wheezing, shortness of breath).   metoprolol tartrate 25 MG tablet Commonly known as: LOPRESSOR Take 1 tablet (25 mg total) by mouth 2 (two) times daily.   pantoprazole 40 MG tablet Commonly known as: Protonix Take  1 tablet (40 mg total) by mouth daily.   sodium bicarbonate 650 MG tablet Take 1,300 mg by mouth 3 (three) times daily.       Allergies: No Known Allergies  Family History: Family History  Problem Relation Age of Onset  . CAD Mother   . Stroke Mother   . CAD Father   . Stroke Father   . Breast cancer Sister     Social History:  reports that he quit smoking about 8 years ago. His smoking use included cigarettes. He has a 52.50 pack-year smoking history. He has never used smokeless tobacco. He reports  that he does not drink alcohol or use drugs.   Physical Exam: There were no vitals taken for this visit.  Constitutional:  Alert and oriented, No acute distress. HEENT: [place] AT, moist mucus membranes.  Trachea midline, no masses. Cardiovascular: No clubbing, cyanosis, or edema. Respiratory: Normal respiratory effort, no increased work of breathing. GI: Abdomen is soft, nontender, nondistended, no abdominal masses GU: No CVA tenderness Lymph: No cervical or inguinal lymphadenopathy. Skin: No rashes, bruises or suspicious lesions. Neurologic: Grossly intact, no focal deficits, moving all 4 extremities. Psychiatric: Normal mood and affect.  Laboratory Data: Lab Results  Component Value Date   WBC 7.6 10/20/2019   HGB 11.0 (L) 10/20/2019   HCT 35.2 (L) 10/20/2019   MCV 88.4 10/20/2019   PLT 293 10/20/2019    Lab Results  Component Value Date   CREATININE 2.82 (H) 08/06/2019    No results found for: PSA  No results found for: TESTOSTERONE  Lab Results  Component Value Date   HGBA1C 6.5 (H) 04/20/2019    Urinalysis  Pertinent Imaging: ***  Assessment & Plan:    @DIAGMED @  No follow-ups on file.  [facility] [address] [place], [place] [address] [phone]  I, Lucas Mallow, am acting as a scribe for Dr. Nicki Reaper C. Stoioff,  {Add Media planner

## 2019-11-04 ENCOUNTER — Ambulatory Visit: Payer: Self-pay | Admitting: Urology

## 2019-11-15 ENCOUNTER — Ambulatory Visit: Payer: Medicare HMO | Attending: Internal Medicine

## 2019-11-15 ENCOUNTER — Other Ambulatory Visit: Payer: Self-pay

## 2019-11-15 DIAGNOSIS — Z23 Encounter for immunization: Secondary | ICD-10-CM

## 2019-11-15 NOTE — Progress Notes (Signed)
   Covid-19 Vaccination Clinic  Name:  Bryan Brewer    MRN: [id number] DOB: 1940/11/12  11/15/2019  Bryan Brewer was observed post Covid-19 immunization for 15 minutes without incident. He was provided with Vaccine Information Sheet and instruction to access the V-Safe system.   Bryan Brewer was instructed to call 911 with any severe reactions post vaccine: Marland Kitchen Difficulty breathing  . Swelling of face and throat  . A fast heartbeat  . A bad rash all over body  . Dizziness and weakness   Immunizations Administered    Name Date Dose VIS Date Route   Pfizer COVID-19 Vaccine 11/15/2019  6:10 PM 0.3 mL 07/17/2019 Intramuscular   Manufacturer: [place]   Lot: [id number]   [place]: [id number]

## 2019-11-26 ENCOUNTER — Encounter: Payer: Self-pay | Admitting: Urology

## 2019-11-26 ENCOUNTER — Ambulatory Visit: Payer: Self-pay | Admitting: Urology

## 2019-12-08 ENCOUNTER — Ambulatory Visit: Payer: Medicare HMO

## 2019-12-11 DIAGNOSIS — I10 Essential (primary) hypertension: Secondary | ICD-10-CM | POA: Diagnosis not present

## 2019-12-11 DIAGNOSIS — J432 Centrilobular emphysema: Secondary | ICD-10-CM | POA: Diagnosis not present

## 2019-12-11 DIAGNOSIS — Z951 Presence of aortocoronary bypass graft: Secondary | ICD-10-CM | POA: Diagnosis not present

## 2019-12-11 DIAGNOSIS — R195 Other fecal abnormalities: Secondary | ICD-10-CM | POA: Diagnosis not present

## 2019-12-11 DIAGNOSIS — Z8551 Personal history of malignant neoplasm of bladder: Secondary | ICD-10-CM | POA: Diagnosis not present

## 2019-12-11 DIAGNOSIS — I5022 Chronic systolic (congestive) heart failure: Secondary | ICD-10-CM | POA: Diagnosis not present

## 2019-12-11 DIAGNOSIS — R8290 Unspecified abnormal findings in urine: Secondary | ICD-10-CM | POA: Diagnosis not present

## 2019-12-11 DIAGNOSIS — E1122 Type 2 diabetes mellitus with diabetic chronic kidney disease: Secondary | ICD-10-CM | POA: Diagnosis not present

## 2019-12-11 DIAGNOSIS — R194 Change in bowel habit: Secondary | ICD-10-CM | POA: Diagnosis not present

## 2019-12-11 DIAGNOSIS — N184 Chronic kidney disease, stage 4 (severe): Secondary | ICD-10-CM | POA: Diagnosis not present

## 2019-12-15 ENCOUNTER — Encounter: Payer: Self-pay | Admitting: Internal Medicine

## 2019-12-15 NOTE — Progress Notes (Signed)
Follow-up Outpatient Visit Date: 12/16/2019  Primary Care Provider: Tracie Harrier, [address] [address]  Chief Complaint: Follow-up CAD and HFpEF  HPI:  Mr. Scallon is a 79 y.o. male with history of CAD s/p CABG (2019 at [facility]; LIMA-LAD, SVG-diagonal, SVG-OM1, and SVG-OM2), AAA, HTN, HLD, DM2, COPD, CKD stage 4, and bladder cancer, who presents for follow-up of CAD and HFpEF.  I last saw him in 09/2019, at which time Mr. Digioia was doing well.  He remained on low dose furosemide without significant swelling.  Today, Mr. Leavell reports he has been feeling well.  He denies chest pain, shortness of breath, palpitations, lightheadedness, and edema.  He recently was contacted by his PCP regarding abnormal potassium level and was given a medication that induced diarrhea.  Mr. Bail is under the impression that his potassium was low and has been trying to eat more potassium (labs reveal he had hyperkalemia with a potassium of 5.7).  He is due for repeat labs later this week.  --------------------------------------------------------------------------------------------------  Past Medical History:  Diagnosis Date  . (HFpEF) heart failure with preserved ejection fraction ([place])    a. 03/2019 Echo: EF 60-65%, nl RV fxn.  Marland Kitchen AAA (abdominal aortic aneurysm) ([place]) 09/2019   3.0 infrarenal AAA incidentally noted on abdominal/pelvic CT  . Bladder cancer ([place])   . C. difficile colitis 04/2019  . CKD (chronic kidney disease), stage IV ([place])   . COPD (chronic obstructive pulmonary disease) ([place])   . Coronary artery disease    a. 10/2017 NSTEMI/Cath: LM 61m 75d, LAD 75ost, 90p, D1 100, LCX 781m, OM1 90, RCA 25p; b. 10/2017 CABG x 4 ([facility]): LIMA->LAD, VG->Diag, VG->OM1, VG->OM2.  . Hyperlipidemia   . Hypertension   . NSVT (nonsustained ventricular tachycardia) ([place]   a. 06/2019 [facility]: 2 runs of NSVT up to 12 beats, max rate 187 bpm.  . PSVT (paroxysmal  supraventricular tachycardia) ([place]   06/2019 [facility]: 1820 episodes of SVT lasting up to 00:01:16 w/ max rate of 210.  . Type 2 diabetes mellitus ([place]   Past Surgical History:  Procedure Laterality Date  . COLONOSCOPY    . CORONARY ARTERY BYPASS GRAFT  2019   [facility]; LIMA-LAD, SVG-diagonal, SVG-OM1, and SVG-OM2  . ESOPHAGOGASTRODUODENOSCOPY N/A 04/23/2019   Procedure: ESOPHAGOGASTRODUODENOSCOPY (EGD);  Surgeon: AnJonathon BellowsMD;  [facility];  Service: Gastroenterology;  Laterality: N/A;  . ESOPHAGOGASTRODUODENOSCOPY (EGD) WITH PROPOFOL N/A 04/21/2019   Procedure: ESOPHAGOGASTRODUODENOSCOPY (EGD) WITH PROPOFOL;  Surgeon: AnJonathon BellowsMD;  [facility];  Service: Gastroenterology;  Laterality: N/A;  . LEFT HEART CATH AND CORONARY ANGIOGRAPHY N/A 10/31/2017   Procedure: LEFT HEART CATH AND CORONARY ANGIOGRAPHY;  Surgeon: CaYolonda KidaMD;  [facility];  Service: Cardiovascular;  Laterality: N/A;  . REVISION UROSTOMY CUTANEOUS       Recent CV Pertinent Labs: Lab Results  Component Value Date   INR 1.3 (H) 04/20/2019   INR 1.0 04/21/2012   BNP 762.0 (H) 04/01/2019   K 4.1 08/06/2019   K 4.8 04/21/2012   MG 1.8 04/21/2019   BUN 48 (H) 08/06/2019   BUN 15 04/21/2012   CREATININE 2.82 (H) 08/06/2019   CREATININE 1.27 04/21/2012    Past medical and surgical history were reviewed and updated in EPIC.  Current Meds  Medication Sig  . aspirin EC 81 MG tablet Take 81 mg by mouth every evening.   . Marland Kitchentorvastatin (LIPITOR) 80 MG tablet  Take 80 mg by mouth at bedtime.  . blood glucose meter kit and supplies KIT Dispense based on patient and insurance preference. Use up to four times daily as directed. (FOR ICD-9 250.00, 250.01).  . furosemide (LASIX) 20 MG tablet Take 1 tablet (20 mg total) by mouth daily.  Marland Kitchen glipiZIDE (GLUCOTROL) 5 MG tablet Take 0.5 tablets (2.5 mg total) by mouth daily before breakfast.  . ipratropium-albuterol (DUONEB) 0.5-2.5  (3) MG/3ML SOLN Take 3 mLs by nebulization every 6 (six) hours as needed (wheezing, shortness of breath).  . metoprolol tartrate (LOPRESSOR) 25 MG tablet Take 1 tablet (25 mg total) by mouth 2 (two) times daily.  . mometasone-formoterol (DULERA) 200-5 MCG/ACT AERO Inhale 2 puffs into the lungs 2 (two) times daily.  . pantoprazole (PROTONIX) 40 MG tablet Take 1 tablet (40 mg total) by mouth daily.  . sodium bicarbonate 650 MG tablet Take 1,300 mg by mouth 3 (three) times daily.    Allergies: Patient has no known allergies.  Social History   Tobacco Use  . Smoking status: Former Smoker    Packs/day: 1.50    Years: 35.00    Pack years: 52.50    Types: Cigarettes    Quit date: 2013    Years since quitting: 8.3  . Smokeless tobacco: Never Used  Substance Use Topics  . Alcohol use: No  . Drug use: Never    Family History  Problem Relation Age of Onset  . CAD Mother   . Stroke Mother   . CAD Father   . Stroke Father   . Breast cancer Sister     Review of Systems: Mobility severely limited by knee pain due to arthritis.  Otherwise, a 12-system review of systems was performed and was negative except as noted in the HPI.  --------------------------------------------------------------------------------------------------  Physical Exam: BP 128/60 (BP Location: Left Arm, Patient Position: Sitting, Cuff Size: Normal)   Pulse 61   Ht 5' 7"  (1.702 m)   Wt 145 lb (65.8 kg)   SpO2 98%   BMI 22.71 kg/m   General: NAD.  Seated in a wheelchair.  Accompanied by his wife. Neck: No JVD or HJR. Lungs: Clear to auscultation without wheezes or crackles. Heart: Regular rate and rhythm without murmurs. Abdomen: Soft, nontender, nondistended. Extremities: No lower extremity edema.  EKG: Normal sinus rhythm without significant abnormality.  Lab Results  Component Value Date   WBC 7.6 10/20/2019   HGB 11.0 (L) 10/20/2019   HCT 35.2 (L) 10/20/2019   MCV 88.4 10/20/2019   PLT 293  10/20/2019    Lab Results  Component Value Date   NA 138 08/06/2019   K 4.1 08/06/2019   CL 107 (H) 08/06/2019   CO2 18 (L) 08/06/2019   BUN 48 (H) 08/06/2019   CREATININE 2.82 (H) 08/06/2019   GLUCOSE 166 (H) 08/06/2019   ALT 12 06/02/2019    No results found for: CHOL, HDL, LDLCALC, LDLDIRECT, TRIG, CHOLHDL  --------------------------------------------------------------------------------------------------  ASSESSMENT AND PLAN: Chronic HFpEF: Mr. Allcock appears euvolemic and well compensated.  We will continue low-dose furosemide.  He will need to undergo continued close monitoring of his renal function in the setting of advanced CKD and recent hyperkalemia.  Coronary artery disease: No signs or symptoms to suggest worsening coronary insufficiency.  Continue current medications for secondary prevention.  LDL at goal on recent labs by Dr. Ginette Pitman.  Hyperkalemia and chronic kidney disease stage 4: Recent labs demonstrate potassium of 5.7.  I encouraged Mr. Wiberg to avoid foods  high in potassium and follow-up with Dr. Ginette Pitman later this week for lab work.  Follow-up: Return to clinic in 6 months.  Nelva Bush, MD 12/16/2019 4:12 PM

## 2019-12-16 ENCOUNTER — Encounter: Payer: Self-pay | Admitting: Internal Medicine

## 2019-12-16 ENCOUNTER — Other Ambulatory Visit: Payer: Self-pay

## 2019-12-16 ENCOUNTER — Ambulatory Visit (INDEPENDENT_AMBULATORY_CARE_PROVIDER_SITE_OTHER): Payer: Medicare HMO | Admitting: Internal Medicine

## 2019-12-16 VITALS — BP 128/60 | HR 61 | Ht 67.0 in | Wt 145.0 lb

## 2019-12-16 DIAGNOSIS — E875 Hyperkalemia: Secondary | ICD-10-CM | POA: Diagnosis not present

## 2019-12-16 DIAGNOSIS — I2510 Atherosclerotic heart disease of native coronary artery without angina pectoris: Secondary | ICD-10-CM

## 2019-12-16 DIAGNOSIS — I5032 Chronic diastolic (congestive) heart failure: Secondary | ICD-10-CM

## 2019-12-16 DIAGNOSIS — N184 Chronic kidney disease, stage 4 (severe): Secondary | ICD-10-CM | POA: Diagnosis not present

## 2019-12-16 NOTE — Patient Instructions (Signed)

## 2019-12-17 ENCOUNTER — Encounter: Payer: Self-pay | Admitting: Internal Medicine

## 2019-12-17 DIAGNOSIS — E875 Hyperkalemia: Secondary | ICD-10-CM | POA: Insufficient documentation

## 2019-12-18 DIAGNOSIS — N179 Acute kidney failure, unspecified: Secondary | ICD-10-CM | POA: Diagnosis not present

## 2019-12-18 DIAGNOSIS — E1122 Type 2 diabetes mellitus with diabetic chronic kidney disease: Secondary | ICD-10-CM | POA: Diagnosis not present

## 2019-12-18 DIAGNOSIS — J449 Chronic obstructive pulmonary disease, unspecified: Secondary | ICD-10-CM | POA: Diagnosis not present

## 2019-12-18 DIAGNOSIS — Z0000 Encounter for general adult medical examination without abnormal findings: Secondary | ICD-10-CM | POA: Diagnosis not present

## 2019-12-18 DIAGNOSIS — Z951 Presence of aortocoronary bypass graft: Secondary | ICD-10-CM | POA: Diagnosis not present

## 2019-12-18 DIAGNOSIS — E875 Hyperkalemia: Secondary | ICD-10-CM | POA: Diagnosis not present

## 2019-12-18 DIAGNOSIS — M170 Bilateral primary osteoarthritis of knee: Secondary | ICD-10-CM | POA: Diagnosis not present

## 2019-12-18 DIAGNOSIS — I129 Hypertensive chronic kidney disease with stage 1 through stage 4 chronic kidney disease, or unspecified chronic kidney disease: Secondary | ICD-10-CM | POA: Diagnosis not present

## 2019-12-18 DIAGNOSIS — Z79899 Other long term (current) drug therapy: Secondary | ICD-10-CM | POA: Diagnosis not present

## 2019-12-24 DIAGNOSIS — J019 Acute sinusitis, unspecified: Secondary | ICD-10-CM | POA: Diagnosis not present

## 2019-12-24 DIAGNOSIS — J340 Abscess, furuncle and carbuncle of nose: Secondary | ICD-10-CM | POA: Diagnosis not present

## 2019-12-24 DIAGNOSIS — H6122 Impacted cerumen, left ear: Secondary | ICD-10-CM | POA: Diagnosis not present

## 2019-12-24 DIAGNOSIS — J300 Vasomotor rhinitis: Secondary | ICD-10-CM | POA: Diagnosis not present

## 2020-01-01 DIAGNOSIS — H6063 Unspecified chronic otitis externa, bilateral: Secondary | ICD-10-CM | POA: Diagnosis not present

## 2020-01-01 DIAGNOSIS — H6122 Impacted cerumen, left ear: Secondary | ICD-10-CM | POA: Diagnosis not present

## 2020-01-08 DIAGNOSIS — M13851 Other specified arthritis, right hip: Secondary | ICD-10-CM | POA: Diagnosis not present

## 2020-01-13 ENCOUNTER — Other Ambulatory Visit: Payer: Self-pay

## 2020-01-13 ENCOUNTER — Inpatient Hospital Stay
Admission: EM | Admit: 2020-01-13 | Discharge: 2020-01-19 | DRG: 481 | Disposition: A | Discharge status: Home Health | Payer: Medicare HMO | Attending: Internal Medicine | Admitting: Internal Medicine

## 2020-01-13 ENCOUNTER — Emergency Department: Payer: Medicare HMO

## 2020-01-13 ENCOUNTER — Encounter: Payer: Self-pay | Admitting: *Deleted

## 2020-01-13 DIAGNOSIS — Z419 Encounter for procedure for purposes other than remedying health state, unspecified: Secondary | ICD-10-CM

## 2020-01-13 DIAGNOSIS — W1789XA Other fall from one level to another, initial encounter: Secondary | ICD-10-CM | POA: Diagnosis present

## 2020-01-13 DIAGNOSIS — S199XXA Unspecified injury of neck, initial encounter: Secondary | ICD-10-CM | POA: Diagnosis not present

## 2020-01-13 DIAGNOSIS — I252 Old myocardial infarction: Secondary | ICD-10-CM | POA: Diagnosis not present

## 2020-01-13 DIAGNOSIS — L89152 Pressure ulcer of sacral region, stage 2: Secondary | ICD-10-CM | POA: Diagnosis present

## 2020-01-13 DIAGNOSIS — I2510 Atherosclerotic heart disease of native coronary artery without angina pectoris: Secondary | ICD-10-CM | POA: Diagnosis present

## 2020-01-13 DIAGNOSIS — I10 Essential (primary) hypertension: Secondary | ICD-10-CM | POA: Diagnosis not present

## 2020-01-13 DIAGNOSIS — Z951 Presence of aortocoronary bypass graft: Secondary | ICD-10-CM | POA: Diagnosis not present

## 2020-01-13 DIAGNOSIS — Z03818 Encounter for observation for suspected exposure to other biological agents ruled out: Secondary | ICD-10-CM | POA: Diagnosis not present

## 2020-01-13 DIAGNOSIS — Z20822 Contact with and (suspected) exposure to covid-19: Secondary | ICD-10-CM | POA: Diagnosis present

## 2020-01-13 DIAGNOSIS — E785 Hyperlipidemia, unspecified: Secondary | ICD-10-CM | POA: Diagnosis present

## 2020-01-13 DIAGNOSIS — E7800 Pure hypercholesterolemia, unspecified: Secondary | ICD-10-CM | POA: Diagnosis not present

## 2020-01-13 DIAGNOSIS — I130 Hypertensive heart and chronic kidney disease with heart failure and stage 1 through stage 4 chronic kidney disease, or unspecified chronic kidney disease: Secondary | ICD-10-CM | POA: Diagnosis not present

## 2020-01-13 DIAGNOSIS — S72002A Fracture of unspecified part of neck of left femur, initial encounter for closed fracture: Secondary | ICD-10-CM | POA: Diagnosis not present

## 2020-01-13 DIAGNOSIS — J449 Chronic obstructive pulmonary disease, unspecified: Secondary | ICD-10-CM | POA: Diagnosis present

## 2020-01-13 DIAGNOSIS — M25512 Pain in left shoulder: Secondary | ICD-10-CM | POA: Diagnosis not present

## 2020-01-13 DIAGNOSIS — S51012A Laceration without foreign body of left elbow, initial encounter: Secondary | ICD-10-CM | POA: Diagnosis not present

## 2020-01-13 DIAGNOSIS — Z01818 Encounter for other preprocedural examination: Secondary | ICD-10-CM

## 2020-01-13 DIAGNOSIS — S72102A Unspecified trochanteric fracture of left femur, initial encounter for closed fracture: Secondary | ICD-10-CM | POA: Diagnosis not present

## 2020-01-13 DIAGNOSIS — W19XXXA Unspecified fall, initial encounter: Secondary | ICD-10-CM | POA: Diagnosis present

## 2020-01-13 DIAGNOSIS — Z79899 Other long term (current) drug therapy: Secondary | ICD-10-CM

## 2020-01-13 DIAGNOSIS — I5032 Chronic diastolic (congestive) heart failure: Secondary | ICD-10-CM | POA: Diagnosis present

## 2020-01-13 DIAGNOSIS — Z7982 Long term (current) use of aspirin: Secondary | ICD-10-CM | POA: Diagnosis not present

## 2020-01-13 DIAGNOSIS — Z936 Other artificial openings of urinary tract status: Secondary | ICD-10-CM

## 2020-01-13 DIAGNOSIS — N184 Chronic kidney disease, stage 4 (severe): Secondary | ICD-10-CM | POA: Diagnosis present

## 2020-01-13 DIAGNOSIS — R531 Weakness: Secondary | ICD-10-CM | POA: Diagnosis not present

## 2020-01-13 DIAGNOSIS — S50312A Abrasion of left elbow, initial encounter: Secondary | ICD-10-CM | POA: Diagnosis not present

## 2020-01-13 DIAGNOSIS — R52 Pain, unspecified: Secondary | ICD-10-CM | POA: Diagnosis not present

## 2020-01-13 DIAGNOSIS — E875 Hyperkalemia: Secondary | ICD-10-CM | POA: Diagnosis present

## 2020-01-13 DIAGNOSIS — E1122 Type 2 diabetes mellitus with diabetic chronic kidney disease: Secondary | ICD-10-CM | POA: Diagnosis present

## 2020-01-13 DIAGNOSIS — R519 Headache, unspecified: Secondary | ICD-10-CM | POA: Diagnosis not present

## 2020-01-13 DIAGNOSIS — Z8551 Personal history of malignant neoplasm of bladder: Secondary | ICD-10-CM

## 2020-01-13 DIAGNOSIS — Z87891 Personal history of nicotine dependence: Secondary | ICD-10-CM | POA: Diagnosis not present

## 2020-01-13 DIAGNOSIS — S4992XA Unspecified injury of left shoulder and upper arm, initial encounter: Secondary | ICD-10-CM | POA: Diagnosis not present

## 2020-01-13 DIAGNOSIS — Z7984 Long term (current) use of oral hypoglycemic drugs: Secondary | ICD-10-CM

## 2020-01-13 DIAGNOSIS — D509 Iron deficiency anemia, unspecified: Secondary | ICD-10-CM | POA: Diagnosis present

## 2020-01-13 DIAGNOSIS — I517 Cardiomegaly: Secondary | ICD-10-CM | POA: Diagnosis not present

## 2020-01-13 DIAGNOSIS — S72142A Displaced intertrochanteric fracture of left femur, initial encounter for closed fracture: Secondary | ICD-10-CM

## 2020-01-13 DIAGNOSIS — D631 Anemia in chronic kidney disease: Secondary | ICD-10-CM | POA: Diagnosis present

## 2020-01-13 DIAGNOSIS — Z7951 Long term (current) use of inhaled steroids: Secondary | ICD-10-CM

## 2020-01-13 DIAGNOSIS — J811 Chronic pulmonary edema: Secondary | ICD-10-CM | POA: Diagnosis not present

## 2020-01-13 DIAGNOSIS — N400 Enlarged prostate without lower urinary tract symptoms: Secondary | ICD-10-CM | POA: Diagnosis present

## 2020-01-13 DIAGNOSIS — M79605 Pain in left leg: Secondary | ICD-10-CM | POA: Diagnosis not present

## 2020-01-13 DIAGNOSIS — R079 Chest pain, unspecified: Secondary | ICD-10-CM | POA: Diagnosis not present

## 2020-01-13 DIAGNOSIS — I5022 Chronic systolic (congestive) heart failure: Secondary | ICD-10-CM | POA: Diagnosis not present

## 2020-01-13 DIAGNOSIS — E119 Type 2 diabetes mellitus without complications: Secondary | ICD-10-CM

## 2020-01-13 DIAGNOSIS — Z0389 Encounter for observation for other suspected diseases and conditions ruled out: Secondary | ICD-10-CM | POA: Diagnosis not present

## 2020-01-13 DIAGNOSIS — M25552 Pain in left hip: Secondary | ICD-10-CM | POA: Diagnosis not present

## 2020-01-13 DIAGNOSIS — J181 Lobar pneumonia, unspecified organism: Secondary | ICD-10-CM | POA: Diagnosis not present

## 2020-01-13 DIAGNOSIS — J810 Acute pulmonary edema: Secondary | ICD-10-CM | POA: Diagnosis not present

## 2020-01-13 DIAGNOSIS — L8990 Pressure ulcer of unspecified site, unspecified stage: Secondary | ICD-10-CM | POA: Diagnosis present

## 2020-01-13 LAB — CBC WITH DIFFERENTIAL/PLATELET
Abs Immature Granulocytes: 0.06 10*3/uL (ref 0.00–0.07)
Basophils Absolute: 0.1 10*3/uL (ref 0.0–0.1)
Basophils Relative: 1 %
Eosinophils Absolute: 0 10*3/uL (ref 0.0–0.5)
Eosinophils Relative: 0 %
HCT: 32.9 % — ABNORMAL LOW (ref 39.0–52.0)
Hemoglobin: 10.2 g/dL — ABNORMAL LOW (ref 13.0–17.0)
Immature Granulocytes: 1 %
Lymphocytes Relative: 3 %
Lymphs Abs: 0.3 10*3/uL — ABNORMAL LOW (ref 0.7–4.0)
MCH: 27.7 pg (ref 26.0–34.0)
MCHC: 31 g/dL (ref 30.0–36.0)
MCV: 89.4 fL (ref 80.0–100.0)
Monocytes Absolute: 0.6 10*3/uL (ref 0.1–1.0)
Monocytes Relative: 5 %
Neutro Abs: 9.5 10*3/uL — ABNORMAL HIGH (ref 1.7–7.7)
Neutrophils Relative %: 90 %
Platelets: 313 10*3/uL (ref 150–400)
RBC: 3.68 MIL/uL — ABNORMAL LOW (ref 4.22–5.81)
RDW: 14.1 % (ref 11.5–15.5)
WBC: 10.5 10*3/uL (ref 4.0–10.5)
nRBC: 0 % (ref 0.0–0.2)

## 2020-01-13 LAB — COMPREHENSIVE METABOLIC PANEL
ALT: 10 U/L (ref 0–44)
AST: 10 U/L — ABNORMAL LOW (ref 15–41)
Albumin: 3 g/dL — ABNORMAL LOW (ref 3.5–5.0)
Alkaline Phosphatase: 80 U/L (ref 38–126)
Anion gap: 9 (ref 5–15)
BUN: 65 mg/dL — ABNORMAL HIGH (ref 8–23)
CO2: 17 mmol/L — ABNORMAL LOW (ref 22–32)
Calcium: 8.7 mg/dL — ABNORMAL LOW (ref 8.9–10.3)
Chloride: 107 mmol/L (ref 98–111)
Creatinine, Ser: 3.89 mg/dL — ABNORMAL HIGH (ref 0.61–1.24)
GFR calc Af Amer: 16 mL/min — ABNORMAL LOW (ref >60)
GFR calc non Af Amer: 14 mL/min — ABNORMAL LOW (ref >60)
Glucose, Bld: 184 mg/dL — ABNORMAL HIGH (ref 70–99)
Potassium: 5.6 mmol/L — ABNORMAL HIGH (ref 3.5–5.1)
Sodium: 133 mmol/L — ABNORMAL LOW (ref 135–145)
Total Bilirubin: 0.7 mg/dL (ref 0.3–1.2)
Total Protein: 7.8 g/dL (ref 6.5–8.1)

## 2020-01-13 LAB — APTT: aPTT: 33 seconds (ref 24–36)

## 2020-01-13 LAB — TROPONIN I (HIGH SENSITIVITY): Troponin I (High Sensitivity): 11 ng/L (ref <18)

## 2020-01-13 LAB — PROTIME-INR
INR: 1.3 — ABNORMAL HIGH (ref 0.8–1.2)
Prothrombin Time: 15.9 seconds — ABNORMAL HIGH (ref 11.4–15.2)

## 2020-01-13 MED ORDER — HYDROMORPHONE HCL 1 MG/ML IJ SOLN
0.5000 mg | Freq: Once | INTRAMUSCULAR | Status: AC
  Administered 2020-01-13: 0.5 mg via INTRAVENOUS
  Filled 2020-01-13: qty 1

## 2020-01-13 MED ORDER — SODIUM CHLORIDE 0.9 % IV SOLN: INTRAVENOUS | Status: DC

## 2020-01-13 MED ORDER — INSULIN ASPART 100 UNIT/ML ~~LOC~~ SOLN
0.0000 [IU] | SUBCUTANEOUS | Status: DC
  Administered 2020-01-14: 3 [IU] via SUBCUTANEOUS
  Administered 2020-01-14: 5 [IU] via SUBCUTANEOUS
  Filled 2020-01-13 (×2): qty 1

## 2020-01-13 MED ORDER — CEFAZOLIN SODIUM-DEXTROSE 2-4 GM/100ML-% IV SOLN
2.0000 g | INTRAVENOUS | Status: AC
  Administered 2020-01-14: 2 g via INTRAVENOUS

## 2020-01-13 MED ORDER — SENNOSIDES-DOCUSATE SODIUM 8.6-50 MG PO TABS: 1.0000 | ORAL_TABLET | Freq: Every evening | ORAL | Status: DC | PRN

## 2020-01-13 MED ORDER — HYDROCODONE-ACETAMINOPHEN 5-325 MG PO TABS
1.0000 | ORAL_TABLET | Freq: Four times a day (QID) | ORAL | Status: DC | PRN
  Administered 2020-01-15 – 2020-01-16 (×2): 2 via ORAL
  Administered 2020-01-17 – 2020-01-18 (×2): 1 via ORAL
  Administered 2020-01-19: 2 via ORAL
  Filled 2020-01-13: qty 1
  Filled 2020-01-13 (×4): qty 2

## 2020-01-13 MED ORDER — ONDANSETRON HCL 4 MG/2ML IJ SOLN
4.0000 mg | Freq: Once | INTRAMUSCULAR | Status: AC
  Administered 2020-01-13: 4 mg via INTRAVENOUS
  Filled 2020-01-13: qty 2

## 2020-01-13 MED ORDER — MORPHINE SULFATE (PF) 2 MG/ML IV SOLN
0.5000 mg | INTRAVENOUS | Status: DC | PRN
  Administered 2020-01-13 – 2020-01-14 (×2): 0.5 mg via INTRAVENOUS
  Filled 2020-01-13 (×2): qty 1

## 2020-01-13 NOTE — H&P (Addendum)
History and Physical    ALFERD OBRYANT DZH:[id number] DOB: October 30, 1940 DOA: 01/13/2020  PCP: Tracie Harrier, MD   Patient coming from: Home  I have personally briefly reviewed patient's old medical records in [place]  Chief Complaint: Fall onto the left hip  HPI: Bryan Brewer is a 79 y.o. male with medical history significant for CAD, HFpEF, CKD 4, COPD, bladder cancer status post cystoprostatectomy with urostomy and ileal conduit, CAD s/p CABG followed by cardiologist, Dr. Saunders Revel, last seen on 12/16/2019, who presents to the emergency room after suffering an accidental fall while getting out of his truck.  He fell onto his left hip also with impact to the left shoulder and elbow.  He denied hitting his head and had no loss of consciousness.  He denied preceding lightheadedness, chest pain palpitations or shortness of breath, visual disturbance or one-sided weakness numbness or tingling. ED Course: On arrival he denied complaints except for pain related to the areas of impact.  Vitals were within normal limits.  Blood work pending at the time of admission.  Trauma work-up included CT head and C-spine as well as x-rays of the left shoulder left elbow chest x-ray and bilateral hips.  Hip x-ray showed acute displaced left intertrochanteric fracture.  The emergency room provider spoke with orthopedist Dr. Roland Rack.  Review of Systems: As per HPI otherwise 10 point review of systems negative.    Past Medical History:  Diagnosis Date  . (HFpEF) heart failure with preserved ejection fraction ([place])    a. 03/2019 Echo: EF 60-65%, nl RV fxn.  Marland Kitchen AAA (abdominal aortic aneurysm) ([place]) 09/2019   3.0 infrarenal AAA incidentally noted on abdominal/pelvic CT  . Bladder cancer ([place])   . C. difficile colitis 04/2019  . CKD (chronic kidney disease), stage IV ([place])   . COPD (chronic obstructive pulmonary disease) ([place])   . Coronary artery disease    a. 10/2017 NSTEMI/Cath: LM 53m 75d, LAD 75ost, 90p, D1  100, LCX 782m, OM1 90, RCA 25p; b. 10/2017 CABG x 4 ([facility]): LIMA->LAD, VG->Diag, VG->OM1, VG->OM2.  . Hyperlipidemia   . Hypertension   . NSVT (nonsustained ventricular tachycardia) ([facility]   a. 06/2019 [facility]: 2 runs of NSVT up to 12 beats, max rate 187 bpm.  . PSVT (paroxysmal supraventricular tachycardia) ([facility]   06/2019 [facility]: 1820 episodes of SVT lasting up to 00:01:16 w/ max rate of 210.  . Type 2 diabetes mellitus ([facility]    Past Surgical History:  Procedure Laterality Date  . COLONOSCOPY    . CORONARY ARTERY BYPASS GRAFT  2019   [facility]; LIMA-LAD, SVG-diagonal, SVG-OM1, and SVG-OM2  . ESOPHAGOGASTRODUODENOSCOPY N/A 04/23/2019   Procedure: ESOPHAGOGASTRODUODENOSCOPY (EGD);  Surgeon: AnJonathon BellowsMD;  [facility];  Service: Gastroenterology;  Laterality: N/A;  . ESOPHAGOGASTRODUODENOSCOPY (EGD) WITH PROPOFOL N/A 04/21/2019   Procedure: ESOPHAGOGASTRODUODENOSCOPY (EGD) WITH PROPOFOL;  Surgeon: AnJonathon BellowsMD;  [facility];  Service: Gastroenterology;  Laterality: N/A;  . LEFT HEART CATH AND CORONARY ANGIOGRAPHY N/A 10/31/2017   Procedure: LEFT HEART CATH AND CORONARY ANGIOGRAPHY;  Surgeon: CaYolonda KidaMD;  [facility];  Service: Cardiovascular;  Laterality: N/A;  . REVISION UROSTOMY CUTANEOUS       reports that he quit smoking about 8 years ago. His smoking use included cigarettes. He has a 52.50 pack-year smoking history. He has never used smokeless tobacco. He reports that he does not drink alcohol or use drugs.  No Known Allergies  Family  History  Problem Relation Age of Onset  . CAD Mother   . Stroke Mother   . CAD Father   . Stroke Father   . Breast cancer Sister      Prior to Admission medications   Medication Sig Start Date End Date Taking? Authorizing Provider  aspirin EC 81 MG tablet Take 81 mg by mouth every evening.     [provider]  atorvastatin (LIPITOR) 80 MG tablet Take 80 mg by mouth at bedtime.     [provider]  blood glucose meter kit and supplies KIT Dispense based on patient and insurance preference. Use up to four times daily as directed. (FOR ICD-9 250.00, 250.01). 04/04/19   Gladstone Lighter, MD  furosemide (LASIX) 20 MG tablet Take 1 tablet (20 mg total) by mouth daily. 09/08/19 12/16/19  Theora Gianotti, NP  glipiZIDE (GLUCOTROL) 5 MG tablet Take 0.5 tablets (2.5 mg total) by mouth daily before breakfast. 05/01/19 06/30/28  Henreitta Leber, MD  ipratropium-albuterol (DUONEB) 0.5-2.5 (3) MG/3ML SOLN Take 3 mLs by nebulization every 6 (six) hours as needed (wheezing, shortness of breath). 04/04/19   Gladstone Lighter, MD  metoprolol tartrate (LOPRESSOR) 25 MG tablet Take 1 tablet (25 mg total) by mouth 2 (two) times daily. 06/26/19 12/16/19  End, Harrell Gave, MD  mometasone-formoterol (DULERA) 200-5 MCG/ACT AERO Inhale 2 puffs into the lungs 2 (two) times daily. 04/04/19   Gladstone Lighter, MD  pantoprazole (PROTONIX) 40 MG tablet Take 1 tablet (40 mg total) by mouth daily. 04/25/19 04/24/20  Dustin Flock, MD  sodium bicarbonate 650 MG tablet Take 1,300 mg by mouth 3 (three) times daily.    [provider]    Physical Exam: Vitals:   01/13/20 2133 01/13/20 2134 01/13/20 2244  BP:   (!) 153/70  Pulse: 83  81  Resp: (!) 22    Temp: 98.3 F (36.8 C)    TempSrc: Oral    SpO2: 98%  99%  Weight:  65.3 kg   Height:  '5\' 7"'$  (1.702 m)      Vitals:   01/13/20 2133 01/13/20 2134 01/13/20 2244  BP:   (!) 153/70  Pulse: 83  81  Resp: (!) 22    Temp: 98.3 F (36.8 C)    TempSrc: Oral    SpO2: 98%  99%  Weight:  65.3 kg   Height:  '5\' 7"'$  (1.702 m)       Constitutional: Alert and oriented x 3 . Not in any apparent distress HEENT:      Head: Normocephalic and atraumatic.         Eyes: PERLA, EOMI, Conjunctivae are normal. Sclera is non-icteric.       Mouth/Throat: Mucous membranes are moist.       Neck: Supple with no signs of  meningismus. Cardiovascular: Regular rate and rhythm. No murmurs, gallops, or rubs. 2+ symmetrical distal pulses are present . No JVD. No LE edema Respiratory: Respiratory effort normal .Lungs sounds clear bilaterally. No wheezes, crackles, or rhonchi.  Gastrointestinal: Soft, non tender, and non distended with positive bowel sounds. No rebound or guarding.  Right inguinal hernia, reducible, mild tenderness on palpation Genitourinary: No CVA tenderness. Urostoma left lower quadrant Musculoskeletal:  Tenderness over the left hip .  Left leg shortened and in external rotation and abduction sensation intact 2+ distal pulse.  Abrasions noted to the left elbow and the left shoulder.  Some skin tears on the left elbow  Neurologic: Normal speech and language. Face is  symmetric. Moving all extremities. No gross focal neurologic deficits . Skin: Skin is warm, dry.  Abrasions noted to the left elbow and the left shoulder.  Some skin tears on the left elbow Psychiatric: Mood and affect are normal Speech and behavior are normal   Labs on Admission: I have personally reviewed following labs and imaging studies  CBC: Recent Labs  Lab 01/13/20 2134  WBC 10.5  NEUTROABS 9.5*  HGB 10.2*  HCT 32.9*  MCV 89.4  PLT 683   Basic Metabolic Panel: Recent Labs  Lab 01/13/20 2134  NA 133*  K 5.6*  CL 107  CO2 17*  GLUCOSE 184*  BUN 65*  CREATININE 3.89*  CALCIUM 8.7*   GFR: Estimated Creatinine Clearance: 14.5 mL/min (A) (by C-G formula based on SCr of 3.89 mg/dL (H)). Liver Function Tests: Recent Labs  Lab 01/13/20 2134  AST 10*  ALT 10  ALKPHOS 80  BILITOT 0.7  PROT 7.8  ALBUMIN 3.0*   No results for input(s): LIPASE, AMYLASE in the last 168 hours. No results for input(s): AMMONIA in the last 168 hours. Coagulation Profile: Recent Labs  Lab 01/13/20 2134  INR 1.3*   Cardiac Enzymes: No results for input(s): CKTOTAL, CKMB, CKMBINDEX, TROPONINI in the last 168 hours. BNP (last 3  results) No results for input(s): PROBNP in the last 8760 hours. HbA1C: No results for input(s): HGBA1C in the last 72 hours. CBG: No results for input(s): GLUCAP in the last 168 hours. Lipid Profile: No results for input(s): CHOL, HDL, LDLCALC, TRIG, CHOLHDL, LDLDIRECT in the last 72 hours. Thyroid Function Tests: No results for input(s): TSH, T4TOTAL, FREET4, T3FREE, THYROIDAB in the last 72 hours. Anemia Panel: No results for input(s): VITAMINB12, FOLATE, FERRITIN, TIBC, IRON, RETICCTPCT in the last 72 hours. Urine analysis:    Component Value Date/Time   COLORURINE YELLOW (A) 04/28/2019 1002   APPEARANCEUR CLEAR (A) 04/28/2019 1002   LABSPEC 1.008 04/28/2019 1002   PHURINE 6.0 04/28/2019 1002   GLUCOSEU NEGATIVE 04/28/2019 1002   HGBUR NEGATIVE 04/28/2019 1002   BILIRUBINUR NEGATIVE 04/28/2019 1002   KETONESUR NEGATIVE 04/28/2019 1002   PROTEINUR NEGATIVE 04/28/2019 1002   NITRITE NEGATIVE 04/28/2019 1002   LEUKOCYTESUR NEGATIVE 04/28/2019 1002    Radiological Exams on Admission: DG Elbow Complete Left  Result Date: 01/13/2020 CLINICAL DATA:  Status post EXAM: LEFT ELBOW - COMPLETE 3+ VIEW COMPARISON:  None. FINDINGS: There is no evidence of fracture, dislocation, or joint effusion. There is no evidence of arthropathy or other focal bone abnormality. Soft tissues are unremarkable. IMPRESSION: Negative. Electronically Signed   By: Constance Holster M.D.   On: 01/13/2020 22:17   CT Head Wo Contrast  Result Date: 01/13/2020 CLINICAL DATA:  Head trauma, headache, fall exiting tract EXAM: CT HEAD WITHOUT CONTRAST CT CERVICAL SPINE WITHOUT CONTRAST TECHNIQUE: Multidetector CT imaging of the head and cervical spine was performed following the standard protocol without intravenous contrast. Multiplanar CT image reconstructions of the cervical spine were also generated. COMPARISON:  PET-CT 10/15/2019 FINDINGS: CT HEAD FINDINGS Brain: No evidence of acute infarction, hemorrhage,  hydrocephalus, extra-axial collection or mass lesion/mass effect. Symmetric prominence of the ventricles, cisterns and sulci compatible with parenchymal volume loss. Patchy areas of white matter hypoattenuation are most compatible with chronic microvascular angiopathy. Vascular: Atherosclerotic calcification of the carotid siphons and intradural vertebral arteries. No hyperdense vessel. Skull: No calvarial fracture or suspicious osseous lesion. No scalp swelling or hematoma. Sinuses/Orbits: Paranasal sinuses and mastoid air cells are predominantly clear. Orbital structures  are unremarkable aside from prior lens extractions. Other: Rightward nasal septal deviation with a contacting right-sided nasal septal spur. Minimal nonspecific cutaneous thickening of the right malar soft tissues, correlate with visual inspection. CT CERVICAL SPINE FINDINGS Alignment: Stabilization collar absent at the time of examination. There is mild rightward cranial rotation. No evidence of traumatic listhesis. No abnormally widened, perched or jumped facets. Normal alignment of the craniocervical and atlantoaxial articulations accounting for the degree of rotation. Skull base and vertebrae: No skull base or vertebral body fractures. No vertebral body height loss. No worrisome osseous lesions. Soft tissues and spinal canal: No pre or paravertebral fluid or swelling. No visible canal hematoma. Disc levels: Multilevel intervertebral disc height loss with spondylitic endplate changes. Large disc osteophyte complex at C6-7 results in mild canal stenosis. Smaller complex at C5-6 effaces the ventral thecal sac without significant canal stenosis. Multilevel uncinate spurring and facet hypertrophic changes are also maximal at these levels with at most mild to moderate foraminal narrowing. Upper chest: Extensive centrilobular and paraseptal emphysematous changes in the lung apices. No consolidation or acute cardiopulmonary abnormality. Additional  biapical pleuroparenchymal scarring. Other: Extensive vascular calcium throughout the proximal great vessels and cervical carotids. Normal thyroid. IMPRESSION: 1. No evidence of acute intracranial abnormality. No significant scalp swelling or calvarial fracture. 2. Chronic microvascular angiopathy and parenchymal volume loss. 3. No evidence of acute fracture or traumatic listhesis of the cervical spine. 4. Multilevel degenerative disc disease and facet hypertrophic changes of the cervical spine, most prominent at C6-7 where there is mild canal stenosis. 5. Extensive vascular calcium throughout the proximal great vessels and cervical carotids. 6. Focal nonspecific skin thickening of the right malar soft tissues, correlate with visual inspection. 7. Emphysema (ICD10-J43.9). Electronically Signed   By: Lovena Le M.D.   On: 01/13/2020 22:43   CT Cervical Spine Wo Contrast  Result Date: 01/13/2020 CLINICAL DATA:  Head trauma, headache, fall exiting tract EXAM: CT HEAD WITHOUT CONTRAST CT CERVICAL SPINE WITHOUT CONTRAST TECHNIQUE: Multidetector CT imaging of the head and cervical spine was performed following the standard protocol without intravenous contrast. Multiplanar CT image reconstructions of the cervical spine were also generated. COMPARISON:  PET-CT 10/15/2019 FINDINGS: CT HEAD FINDINGS Brain: No evidence of acute infarction, hemorrhage, hydrocephalus, extra-axial collection or mass lesion/mass effect. Symmetric prominence of the ventricles, cisterns and sulci compatible with parenchymal volume loss. Patchy areas of white matter hypoattenuation are most compatible with chronic microvascular angiopathy. Vascular: Atherosclerotic calcification of the carotid siphons and intradural vertebral arteries. No hyperdense vessel. Skull: No calvarial fracture or suspicious osseous lesion. No scalp swelling or hematoma. Sinuses/Orbits: Paranasal sinuses and mastoid air cells are predominantly clear. Orbital structures  are unremarkable aside from prior lens extractions. Other: Rightward nasal septal deviation with a contacting right-sided nasal septal spur. Minimal nonspecific cutaneous thickening of the right malar soft tissues, correlate with visual inspection. CT CERVICAL SPINE FINDINGS Alignment: Stabilization collar absent at the time of examination. There is mild rightward cranial rotation. No evidence of traumatic listhesis. No abnormally widened, perched or jumped facets. Normal alignment of the craniocervical and atlantoaxial articulations accounting for the degree of rotation. Skull base and vertebrae: No skull base or vertebral body fractures. No vertebral body height loss. No worrisome osseous lesions. Soft tissues and spinal canal: No pre or paravertebral fluid or swelling. No visible canal hematoma. Disc levels: Multilevel intervertebral disc height loss with spondylitic endplate changes. Large disc osteophyte complex at C6-7 results in mild canal stenosis. Smaller complex at C5-6 effaces the  ventral thecal sac without significant canal stenosis. Multilevel uncinate spurring and facet hypertrophic changes are also maximal at these levels with at most mild to moderate foraminal narrowing. Upper chest: Extensive centrilobular and paraseptal emphysematous changes in the lung apices. No consolidation or acute cardiopulmonary abnormality. Additional biapical pleuroparenchymal scarring. Other: Extensive vascular calcium throughout the proximal great vessels and cervical carotids. Normal thyroid. IMPRESSION: 1. No evidence of acute intracranial abnormality. No significant scalp swelling or calvarial fracture. 2. Chronic microvascular angiopathy and parenchymal volume loss. 3. No evidence of acute fracture or traumatic listhesis of the cervical spine. 4. Multilevel degenerative disc disease and facet hypertrophic changes of the cervical spine, most prominent at C6-7 where there is mild canal stenosis. 5. Extensive vascular  calcium throughout the proximal great vessels and cervical carotids. 6. Focal nonspecific skin thickening of the right malar soft tissues, correlate with visual inspection. 7. Emphysema (ICD10-J43.9). Electronically Signed   By: Lovena Le M.D.   On: 01/13/2020 22:43   DG Chest Portable 1 View  Result Date: 01/13/2020 CLINICAL DATA:  Pain EXAM: PORTABLE CHEST 1 VIEW COMPARISON:  04/29/2019 FINDINGS: The heart size is stable but enlarged. The patient is status post prior median sternotomy. There are aortic calcifications. There is mild vascular congestion without overt pulmonary edema. There is no pneumothorax. No significant pleural effusion. IMPRESSION: Cardiomegaly with mild vascular congestion. No overt pulmonary edema or significant pleural effusion. Electronically Signed   By: Constance Holster M.D.   On: 01/13/2020 22:17   DG Shoulder Left  Result Date: 01/13/2020 CLINICAL DATA:  Pain status post fall EXAM: LEFT SHOULDER - 2+ VIEW COMPARISON:  None. FINDINGS: There is no evidence of fracture or dislocation. There is no evidence of arthropathy or other focal bone abnormality. Soft tissues are unremarkable. IMPRESSION: Negative. Electronically Signed   By: Constance Holster M.D.   On: 01/13/2020 22:18   DG Hip Unilat W or Wo Pelvis 2-3 Views Left  Result Date: 01/13/2020 CLINICAL DATA:  Pain EXAM: DG HIP (WITH OR WITHOUT PELVIS) 2-3V LEFT COMPARISON:  None. FINDINGS: There is an acute displaced, comminuted intratrochanteric fracture of the proximal left femur. There is no dislocation. There are moderate degenerative changes of the left hip. There are mild degenerative changes of the right hip. IMPRESSION: Acute displaced, comminuted intratrochanteric fracture of the proximal left femur. Electronically Signed   By: Constance Holster M.D.   On: 01/13/2020 22:16    EKG: Independently reviewed.   Assessment/Plan Principal Problem:  1. Closed left hip fracture, initial encounter ([place])  2.   Accidental fall   3. Preoperative clearance -Patient presents with acute displaced left intertrochanteric hip fracture after an accidental fall trying to get out of his truck -Patient has cardiac history to include CAD, CABG and diastolic heart failure but was recently evaluated by his cardiologist on 12/16/2019 and found to be stable -Patient is at low to moderate risk of perioperative cardiopulmonary complications and can proceed with surgery planned for tomorrow -N.p.o. after midnight -Dr. Roland Rack consulted    Diabetes mellitus type II ([place]) -Sliding scale insulin coverage    CKD (chronic kidney disease) stage 4, GFR 15-29 ml/min ([place]) -Renal function at baseline -Continue sodium bicarbonate. -Monitor renal function and potassium  Urostomy status -History of bladder cancer status post cystoprostatectomy with urostomy and ileal conduit, -Urostomy care  Recurrent inguinal hernias -Consider surgical evaluation while inpatient, patient request    HTN (hypertension) -Hold oral antihypertensives while n.p.o. -Labetalol as needed BP over 160/90  COPD (chronic obstructive pulmonary disease) ([facility]) -Not acutely exacerbated -DuoNebs as needed    Chronic heart failure with preserved ejection fraction (HFpEF) ([place]) -Patient is euvolemic -Can resume home furosemide and metoprolol postoperatively    Coronary artery disease involving native coronary artery of native heart without angina pectoris -No complaints of chest pain and EKG with no acute ST-T wave changes -Resume home aspirin, atorvastatin and metoprolol.     DVT prophylaxis: SCDs  code Status: full code  Family Communication: Wife at bedside Disposition Plan: Back to previous home environment Consults called: Dr Roland Rack Status:inp    Athena Masse MD [facility]     01/13/2020, 11:26 PM

## 2020-01-13 NOTE — ED Provider Notes (Signed)
[facility] Emergency Department Provider Note  ____________________________________________   First MD Initiated Contact with Patient 01/13/20 2132     (approximate)  I have reviewed the triage vital signs and the nursing notes.   HISTORY  Chief Complaint Fall and Leg Pain    HPI Bryan Brewer is a 79 y.o. male with heart failure with preserved EF, incidentally noted 3 cm AAA, CKD, COPD, coronary disease status post bypass who comes in for mechanical fall.  Patient reports coming out of his [place] when he had a mechanical fall onto his left hip.  Does report hitting the back of his head as well.  Denies any C-spine tenderness.  Most of his pain is in his left hip, severe, worse with moving, better at rest.  Also has some abrasions on his left elbow and left shoulder although able to move that better.  He denies any chest pain, shortness of breath, abdominal pain, other extremity pain, back pain.  Has not been ambulatory since the fall.  EMS gave 100 of fentanyl.  Left leg is shortened and rotated          Past Medical History:  Diagnosis Date  . (HFpEF) heart failure with preserved ejection fraction ([place])    a. 03/2019 Echo: EF 60-65%, nl RV fxn.  Marland Kitchen AAA (abdominal aortic aneurysm) ([place]) 09/2019   3.0 infrarenal AAA incidentally noted on abdominal/pelvic CT  . Bladder cancer ([place])   . C. difficile colitis 04/2019  . CKD (chronic kidney disease), stage IV ([place])   . COPD (chronic obstructive pulmonary disease) ([place])   . Coronary artery disease    a. 10/2017 NSTEMI/Cath: LM 41m 75d, LAD 75ost, 90p, D1 100, LCX 783m, OM1 90, RCA 25p; b. 10/2017 CABG x 4 ([facility]): LIMA->LAD, VG->Diag, VG->OM1, VG->OM2.  . Hyperlipidemia   . Hypertension   . NSVT (nonsustained ventricular tachycardia) ([place]   a. 06/2019 Zio: 2 runs of NSVT up to 12 beats, max rate 187 bpm.  . PSVT (paroxysmal supraventricular tachycardia) (HC[place]   06/2019 Zio: 1820 episodes of SVT lasting up  to 00:01:16 w/ max rate of 210.  . Type 2 diabetes mellitus [facility]    Patient Active Problem List   Diagnosis Date Noted  . Hyperkalemia 12/17/2019  . Abdominal pain 09/18/2019  . Iron deficiency anemia 07/14/2019  . PSVT (paroxysmal supraventricular tachycardia) (HCPecos11/21/2020  . Aortic atherosclerosis (HCNoxapater11/05/2019  . Paroxysmal atrial fibrillation (HCBeaulieu11/04/2019  . Bladder cancer (HCRoff10/27/2020  . BPH (benign prostatic hyperplasia) 06/02/2019  . Anemia 05/21/2019  . Chronic heart failure with preserved ejection fraction (HFpEF) (HCTroutdale10/03/2019  . Coronary artery disease involving native coronary artery of native heart without angina pectoris 05/14/2019  . Cellulitis of lower extremity 05/14/2019  . Skin ulcer of calf, limited to breakdown of skin (HCAvalon10/03/2019  . Hypoglycemia 04/29/2019  . Hypotension due to hypovolemia 04/29/2019  . Tachycardia   . GIB (gastrointestinal bleeding) 04/20/2019  . Pressure injury of skin 03/31/2019  . HCAP (healthcare-associated pneumonia) 03/29/2019  . UTI (urinary tract infection) 03/29/2019  . Severe sepsis with septic shock (HCAlicia08/23/2020  . Hyponatremia 03/29/2019  . Acute on chronic renal failure (HCSan Pablo07/06/2019  . Osteoarthritis of knee 01/06/2019  . Chronic systolic CHF (congestive heart failure) (HCWaverly04/16/2020  . Primary osteoarthritis involving multiple joints 08/02/2018  . Presence of aortocoronary bypass graft 11/08/2017  . NSTEMI (non-ST elevated myocardial infarction) (HCMidway03/27/2019  . Diabetes (HCPort Gibson03/27/2019  .  CKD (chronic kidney disease) stage 4, GFR 15-29 ml/min (HCC) 10/30/2017  . HTN (hypertension) 10/30/2017  . HLD (hyperlipidemia) 10/30/2017  . COPD (chronic obstructive pulmonary disease) ([place]) 10/30/2017  . Spinal stenosis of lumbar region 08/20/2017  . Lumbar radiculopathy 08/09/2017  . Hx of bladder cancer 04/30/2016  . Pure hypercholesterolemia 09/08/2015  . Simple chronic bronchitis ([place])  09/08/2015  . Hypomagnesemia 03/30/2015  . Benign essential hypertension 03/30/2015  . Abdominal wall hernia at previous stoma site 01/12/2013  . Diarrhea in adult patient 01/12/2013    Past Surgical History:  Procedure Laterality Date  . COLONOSCOPY    . CORONARY ARTERY BYPASS GRAFT  2019   [facility]; LIMA-LAD, SVG-diagonal, SVG-OM1, and SVG-OM2  . ESOPHAGOGASTRODUODENOSCOPY N/A 04/23/2019   Procedure: ESOPHAGOGASTRODUODENOSCOPY (EGD);  Surgeon: Jonathon Bellows, MD;  Location: [facility] ENDOSCOPY;  Service: Gastroenterology;  Laterality: N/A;  . ESOPHAGOGASTRODUODENOSCOPY (EGD) WITH PROPOFOL N/A 04/21/2019   Procedure: ESOPHAGOGASTRODUODENOSCOPY (EGD) WITH PROPOFOL;  Surgeon: Jonathon Bellows, MD;  Location: [facility] ENDOSCOPY;  Service: Gastroenterology;  Laterality: N/A;  . LEFT HEART CATH AND CORONARY ANGIOGRAPHY N/A 10/31/2017   Procedure: LEFT HEART CATH AND CORONARY ANGIOGRAPHY;  Surgeon: Yolonda Kida, MD;  Location: [place] CV LAB;  Service: Cardiovascular;  Laterality: N/A;  . REVISION UROSTOMY CUTANEOUS      Prior to Admission medications   Medication Sig Start Date End Date Taking? Authorizing Provider  aspirin EC 81 MG tablet Take 81 mg by mouth every evening.     [provider]  atorvastatin (LIPITOR) 80 MG tablet Take 80 mg by mouth at bedtime.    [provider]  blood glucose meter kit and supplies KIT Dispense based on patient and insurance preference. Use up to four times daily as directed. (FOR ICD-9 250.00, 250.01). 04/04/19   [place] Lighter, MD  furosemide (LASIX) 20 MG tablet Take 1 tablet (20 mg total) by mouth daily. 09/08/19 12/16/19  Theora Gianotti, NP  glipiZIDE (GLUCOTROL) 5 MG tablet Take 0.5 tablets (2.5 mg total) by mouth daily before breakfast. 05/01/19 06/30/28  Henreitta Leber, MD  ipratropium-albuterol (DUONEB) 0.5-2.5 (3) MG/3ML SOLN Take 3 mLs by nebulization every 6 (six) hours as needed (wheezing, shortness of breath). 04/04/19    [place] Lighter, MD  metoprolol tartrate (LOPRESSOR) 25 MG tablet Take 1 tablet (25 mg total) by mouth 2 (two) times daily. 06/26/19 12/16/19  End, Harrell Gave, MD  mometasone-formoterol (DULERA) 200-5 MCG/ACT AERO Inhale 2 puffs into the lungs 2 (two) times daily. 04/04/19   [place] Lighter, MD  pantoprazole (PROTONIX) 40 MG tablet Take 1 tablet (40 mg total) by mouth daily. 04/25/19 04/24/20  Dustin Flock, MD  sodium bicarbonate 650 MG tablet Take 1,300 mg by mouth 3 (three) times daily.    [provider]    Allergies Patient has no known allergies.  Family History  Problem Relation Age of Onset  . CAD Mother   . Stroke Mother   . CAD Father   . Stroke Father   . Breast cancer Sister     Social History Social History   Tobacco Use  . Smoking status: Former Smoker    Packs/day: 1.50    Years: 35.00    Pack years: 52.50    Types: Cigarettes    Quit date: 2013    Years since quitting: 8.4  . Smokeless tobacco: Never Used  Substance Use Topics  . Alcohol use: No  . Drug use: Never      Review of Systems Constitutional: No fever/chills,  fall Eyes: No visual changes. ENT: No sore throat. Cardiovascular: Denies chest pain. Respiratory: Denies shortness of breath. Gastrointestinal: No abdominal pain.  No nausea, no vomiting.  No diarrhea.  No constipation. Genitourinary: Negative for dysuria. Musculoskeletal: Negative for back pain.  Positive hip pain Skin: Negative for rash. Neurological: Negative for headaches, focal weakness or numbness.  Hit head All other ROS negative ____________________________________________   PHYSICAL EXAM:  VITAL SIGNS: ED Triage Vitals [01/13/20 2133]  Enc Vitals Group     BP      Pulse Rate 83     Resp (!) 22     Temp 98.3 F (36.8 C)     Temp Source Oral     SpO2 98 %     Weight      Height      Head Circumference      Peak Flow      Pain Score      Pain Loc      Pain Edu?      Excl. in [place]?      Constitutional: Alert and oriented. Well appearing and in no acute distress. Eyes: Conjunctivae are normal. EOMI. Head: Atraumatic. Nose: No congestion/rhinnorhea. Mouth/Throat: Mucous membranes are moist.   Neck: No stridor. Trachea Midline. FROM Cardiovascular: Normal rate, regular rhythm. Grossly normal heart sounds.  Good peripheral circulation. Respiratory: Normal respiratory effort.  No retractions. Lungs CTAB. Gastrointestinal: Soft and nontender. No distention. No abdominal bruits.  Musculoskeletal: Tenderness over the left hip  sensation intact 2+ distal pulse.  Abrasions noted to the left elbow and the left shoulder.  Some skin tears on the left elbow Neurologic:  Normal speech and language. No gross focal neurologic deficits are appreciated.  Skin:  Skin is warm, dry and intact. No rash noted. Psychiatric: Mood and affect are normal. Speech and behavior are normal. GU: Deferred   ____________________________________________   LABS (all labs ordered are listed, but only abnormal results are displayed)  Labs Reviewed  CBC WITH DIFFERENTIAL/PLATELET - Abnormal; Notable for the following components:      Result Value   RBC 3.68 (*)    Hemoglobin 10.2 (*)    HCT 32.9 (*)    Neutro Abs 9.5 (*)    Lymphs Abs 0.3 (*)    All other components within normal limits  COMPREHENSIVE METABOLIC PANEL - Abnormal; Notable for the following components:   Sodium 133 (*)    Potassium 5.6 (*)    CO2 17 (*)    Glucose, Bld 184 (*)    BUN 65 (*)    Creatinine, Ser 3.89 (*)    Calcium 8.7 (*)    Albumin 3.0 (*)    AST 10 (*)    GFR calc non Af Amer 14 (*)    GFR calc Af Amer 16 (*)    All other components within normal limits  PROTIME-INR - Abnormal; Notable for the following components:   Prothrombin Time 15.9 (*)    INR 1.3 (*)    All other components within normal limits  HEMOGLOBIN A1C - Abnormal; Notable for the following components:   Hgb A1c MFr Bld 6.4 (*)    All other  components within normal limits  BASIC METABOLIC PANEL - Abnormal; Notable for the following components:   CO2 16 (*)    Glucose, Bld 124 (*)    BUN 64 (*)    Creatinine, Ser 3.77 (*)    Calcium 8.8 (*)    GFR calc non Af Amer 14 (*)  GFR calc Af Amer 17 (*)    All other components within normal limits  GLUCOSE, CAPILLARY - Abnormal; Notable for the following components:   Glucose-Capillary 113 (*)    All other components within normal limits  GLUCOSE, CAPILLARY - Abnormal; Notable for the following components:   Glucose-Capillary 127 (*)    All other components within normal limits  GLUCOSE, CAPILLARY - Abnormal; Notable for the following components:   Glucose-Capillary 130 (*)    All other components within normal limits  GLUCOSE, CAPILLARY - Abnormal; Notable for the following components:   Glucose-Capillary 163 (*)    All other components within normal limits  GLUCOSE, CAPILLARY - Abnormal; Notable for the following components:   Glucose-Capillary 181 (*)    All other components within normal limits  GLUCOSE, CAPILLARY - Abnormal; Notable for the following components:   Glucose-Capillary 201 (*)    All other components within normal limits  SARS CORONAVIRUS 2 BY RT PCR (HOSPITAL ORDER, [facility])  APTT  GLUCOSE, CAPILLARY  GLUCOSE, CAPILLARY  GLUCOSE, CAPILLARY  BASIC METABOLIC PANEL  CBC  MAGNESIUM  TYPE AND SCREEN  TROPONIN I (HIGH SENSITIVITY)  TROPONIN I (HIGH SENSITIVITY)   ____________________________________________   ED ECG REPORT I, Vanessa Gotebo, the attending physician, personally viewed and interpreted this ECG.  EKG is sinus rate of 85, 0.5 mm ST elevation in aVR, no T wave inversions, normal intervals ____________________________________________  RADIOLOGY Robert Bellow, personally viewed and evaluated these images (plain radiographs) as part of my medical decision making, as well as reviewing the written report by the  radiologist.  ED MD interpretation:  Fracture left hip   Official radiology report(s): IMPRESSION: Acute displaced, comminuted intratrochanteric fracture of the proximal left femur. ____________________________________________   PROCEDURES  Procedure(s) performed (including Critical Care):  Marland KitchenMarland KitchenLaceration Repair  Date/Time: 01/15/2020 5:48 AM Performed by: Vanessa Love, MD Authorized by: Vanessa Chelan, MD   Consent:    Consent obtained:  Verbal   Consent given by:  Patient   Risks discussed:  Infection, need for additional repair, nerve damage, poor wound healing, pain, poor cosmetic result, retained foreign body, tendon damage and vascular damage   Alternatives discussed:  No treatment Anesthesia (see MAR for exact dosages):    Anesthesia method:  None Laceration details:    Length (cm):  6   Depth (mm):  1 Repair type:    Repair type:  Simple Exploration:    Wound exploration: wound explored through full range of motion     Contaminated: no   Treatment:    Area cleansed with:  Saline   Amount of cleaning:  Standard   Irrigation volume:  500cc  Skin repair:    Repair method: 3 dermaclips  Approximation:    Approximation:  Close Post-procedure details:    Dressing:  Non-adherent dressing   Patient tolerance of procedure:  Tolerated well, no immediate complications Comments:     Skin Tear repaired      ____________________________________________   INITIAL IMPRESSION / ASSESSMENT AND PLAN / ED COURSE  Bryan Brewer was evaluated in Emergency Department on 01/13/2020 for the symptoms described in the history of present illness. He was evaluated in the context of the global COVID-19 pandemic, which necessitated consideration that the patient might be at risk for infection with the SARS-CoV-2 virus that causes COVID-19. Institutional protocols and algorithms that pertain to the evaluation of patients at risk for COVID-19 are in a state of  rapid change based on  information released by regulatory bodies including the CDC and federal and state organizations. These policies and algorithms were followed during the patient's care in the ED.    Patient is a 79 year old who comes in with what is concerning for mechanical fall onto his left hip.  Patient has good distal pulse on that foot.  Concern for hip fracture.  Will get x-rays to evaluate as well as x-rays of the elbow and shoulder make sure no other fractures and preop chest x-ray.  Will get CT head evaluate for intracranial hemorrhage and CT cervical due to distracting injury and patient's age.  Will get labs to evaluate Electra abnormalities, AKI.  We will give some IV Dilaudid for patient's pain while getting CT scans and x-rays  Last tetanus shot was in 2021  CT negative  Xray with hip fracture  Skin tearD/w Dr. Roland Rack who is aware.   Skin tear to the left elbow repaired with dermaclips.    Will admit to hospital.        ____________________________________________   FINAL CLINICAL IMPRESSION(S) / ED DIAGNOSES   Final diagnoses:  Fall, initial encounter  Closed displaced intertrochanteric fracture of left femur, initial encounter ([place])      MEDICATIONS GIVEN DURING THIS VISIT:  Medications  HYDROmorphone (DILAUDID) injection 0.5 mg (has no administration in time range)  ondansetron (ZOFRAN) injection 4 mg (has no administration in time range)     ED Discharge Orders    None       Note:  This document was prepared using Dragon voice recognition software and may include unintentional dictation errors.   Vanessa [place], MD 01/15/20 [phone]

## 2020-01-13 NOTE — ED Triage Notes (Signed)
Pt is 79 yo male who fell while getting out of his truck. He was unable to get up and after 1 hour of trying his wife called 911. Left hip pain with shortening and rotation. Skin tear to left shoulder and elbow. Pt is A&O x 4 but overal physical appearance is dishelved

## 2020-01-14 ENCOUNTER — Inpatient Hospital Stay: Payer: Medicare HMO | Admitting: Anesthesiology

## 2020-01-14 ENCOUNTER — Encounter: Payer: Self-pay | Admitting: Internal Medicine

## 2020-01-14 ENCOUNTER — Encounter
Admission: EM | Disposition: A | Discharge status: Home Health | Payer: Self-pay | Source: Home / Self Care | Attending: Hospitalist

## 2020-01-14 ENCOUNTER — Inpatient Hospital Stay: Payer: Medicare HMO

## 2020-01-14 DIAGNOSIS — Z936 Other artificial openings of urinary tract status: Secondary | ICD-10-CM

## 2020-01-14 HISTORY — PX: INTRAMEDULLARY (IM) NAIL INTERTROCHANTERIC: SHX5875

## 2020-01-14 LAB — HEMOGLOBIN A1C
Hgb A1c MFr Bld: 6.4 % — ABNORMAL HIGH (ref 4.8–5.6)
Mean Plasma Glucose: 136.98 mg/dL

## 2020-01-14 LAB — GLUCOSE, CAPILLARY
Glucose-Capillary: 75 mg/dL (ref 70–99)
Glucose-Capillary: 113 mg/dL — ABNORMAL HIGH (ref 70–99)
Glucose-Capillary: 127 mg/dL — ABNORMAL HIGH (ref 70–99)
Glucose-Capillary: 130 mg/dL — ABNORMAL HIGH (ref 70–99)
Glucose-Capillary: 163 mg/dL — ABNORMAL HIGH (ref 70–99)
Glucose-Capillary: 181 mg/dL — ABNORMAL HIGH (ref 70–99)
Glucose-Capillary: 201 mg/dL — ABNORMAL HIGH (ref 70–99)
Glucose-Capillary: 95 mg/dL (ref 70–99)

## 2020-01-14 LAB — BASIC METABOLIC PANEL
Anion gap: 9 (ref 5–15)
BUN: 64 mg/dL — ABNORMAL HIGH (ref 8–23)
CO2: 16 mmol/L — ABNORMAL LOW (ref 22–32)
Calcium: 8.8 mg/dL — ABNORMAL LOW (ref 8.9–10.3)
Chloride: 111 mmol/L (ref 98–111)
Creatinine, Ser: 3.77 mg/dL — ABNORMAL HIGH (ref 0.61–1.24)
GFR calc Af Amer: 17 mL/min — ABNORMAL LOW (ref >60)
GFR calc non Af Amer: 14 mL/min — ABNORMAL LOW (ref >60)
Glucose, Bld: 124 mg/dL — ABNORMAL HIGH (ref 70–99)
Potassium: 5.1 mmol/L (ref 3.5–5.1)
Sodium: 136 mmol/L (ref 135–145)

## 2020-01-14 LAB — TYPE AND SCREEN
ABO/RH(D): A POS
Antibody Screen: NEGATIVE

## 2020-01-14 LAB — TROPONIN I (HIGH SENSITIVITY): Troponin I (High Sensitivity): 12 ng/L (ref <18)

## 2020-01-14 LAB — SARS CORONAVIRUS 2 BY RT PCR (HOSPITAL ORDER, PERFORMED IN ~~LOC~~ HOSPITAL LAB): SARS Coronavirus 2: NEGATIVE

## 2020-01-14 SURGERY — FIXATION, FRACTURE, INTERTROCHANTERIC, WITH INTRAMEDULLARY ROD
Anesthesia: Spinal | Laterality: Left

## 2020-01-14 MED ORDER — DOCUSATE SODIUM 100 MG PO CAPS
100.0000 mg | ORAL_CAPSULE | Freq: Two times a day (BID) | ORAL | Status: DC
  Administered 2020-01-15 – 2020-01-18 (×8): 100 mg via ORAL
  Filled 2020-01-14 (×9): qty 1

## 2020-01-14 MED ORDER — ONDANSETRON HCL 4 MG/2ML IJ SOLN
4.0000 mg | Freq: Four times a day (QID) | INTRAMUSCULAR | Status: DC | PRN
  Administered 2020-01-15: 4 mg via INTRAVENOUS
  Filled 2020-01-14: qty 2

## 2020-01-14 MED ORDER — BUPIVACAINE-EPINEPHRINE (PF) 0.5% -1:200000 IJ SOLN
INTRAMUSCULAR | Status: DC | PRN
  Administered 2020-01-14: 30 mL

## 2020-01-14 MED ORDER — ONDANSETRON HCL 4 MG PO TABS: 4.0000 mg | ORAL_TABLET | Freq: Four times a day (QID) | ORAL | Status: DC | PRN

## 2020-01-14 MED ORDER — METOCLOPRAMIDE HCL 5 MG/ML IJ SOLN: 5.0000 mg | Freq: Three times a day (TID) | INTRAMUSCULAR | Status: DC | PRN

## 2020-01-14 MED ORDER — FENTANYL CITRATE (PF) 100 MCG/2ML IJ SOLN
INTRAMUSCULAR | Status: AC
  Filled 2020-01-14: qty 2

## 2020-01-14 MED ORDER — TRAMADOL HCL 50 MG PO TABS
50.0000 mg | ORAL_TABLET | Freq: Four times a day (QID) | ORAL | Status: DC | PRN
  Administered 2020-01-14 – 2020-01-16 (×3): 50 mg via ORAL
  Filled 2020-01-14 (×3): qty 1

## 2020-01-14 MED ORDER — ENOXAPARIN SODIUM 30 MG/0.3ML ~~LOC~~ SOLN
30.0000 mg | SUBCUTANEOUS | Status: DC
  Administered 2020-01-15 – 2020-01-19 (×5): 30 mg via SUBCUTANEOUS
  Filled 2020-01-14 (×5): qty 0.3

## 2020-01-14 MED ORDER — MAGNESIUM HYDROXIDE 400 MG/5ML PO SUSP: 30.0000 mL | Freq: Every day | ORAL | Status: DC | PRN

## 2020-01-14 MED ORDER — SODIUM CHLORIDE 0.9 % IV SOLN
INTRAVENOUS | Status: DC | PRN
  Administered 2020-01-14: 50 ug/min via INTRAVENOUS

## 2020-01-14 MED ORDER — ASPIRIN EC 81 MG PO TBEC
81.0000 mg | DELAYED_RELEASE_TABLET | Freq: Every evening | ORAL | Status: DC
  Administered 2020-01-14 – 2020-01-18 (×5): 81 mg via ORAL
  Filled 2020-01-14 (×6): qty 1

## 2020-01-14 MED ORDER — ONDANSETRON HCL 4 MG/2ML IJ SOLN: 4.0000 mg | Freq: Once | INTRAMUSCULAR | Status: DC | PRN

## 2020-01-14 MED ORDER — ACETAMINOPHEN 160 MG/5ML PO SOLN
325.0000 mg | ORAL | Status: DC | PRN
  Filled 2020-01-14: qty 20.3

## 2020-01-14 MED ORDER — SODIUM CHLORIDE 0.9 % IV SOLN: INTRAVENOUS | Status: DC

## 2020-01-14 MED ORDER — ACETAMINOPHEN 325 MG PO TABS: 325.0000 mg | ORAL_TABLET | ORAL | Status: DC | PRN

## 2020-01-14 MED ORDER — ENOXAPARIN SODIUM 40 MG/0.4ML ~~LOC~~ SOLN: 40.0000 mg | SUBCUTANEOUS | Status: DC

## 2020-01-14 MED ORDER — IPRATROPIUM-ALBUTEROL 0.5-2.5 (3) MG/3ML IN SOLN: 3.0000 mL | Freq: Four times a day (QID) | RESPIRATORY_TRACT | Status: DC | PRN

## 2020-01-14 MED ORDER — GLIPIZIDE 5 MG PO TABS
2.5000 mg | ORAL_TABLET | Freq: Every day | ORAL | Status: DC
  Administered 2020-01-15: 2.5 mg via ORAL
  Filled 2020-01-14 (×2): qty 0.5

## 2020-01-14 MED ORDER — METOPROLOL TARTRATE 25 MG PO TABS
25.0000 mg | ORAL_TABLET | Freq: Two times a day (BID) | ORAL | Status: DC
  Administered 2020-01-14: 25 mg via ORAL
  Filled 2020-01-14: qty 1

## 2020-01-14 MED ORDER — DEXTROSE-NACL 5-0.45 % IV SOLN: INTRAVENOUS | Status: DC

## 2020-01-14 MED ORDER — BISACODYL 10 MG RE SUPP: 10.0000 mg | Freq: Every day | RECTAL | Status: DC | PRN

## 2020-01-14 MED ORDER — DIPHENHYDRAMINE HCL 12.5 MG/5ML PO ELIX: 12.5000 mg | ORAL_SOLUTION | ORAL | Status: DC | PRN

## 2020-01-14 MED ORDER — PROPOFOL 10 MG/ML IV BOLUS
INTRAVENOUS | Status: DC | PRN
  Administered 2020-01-14: 20 mg via INTRAVENOUS

## 2020-01-14 MED ORDER — PROPOFOL 500 MG/50ML IV EMUL
INTRAVENOUS | Status: DC | PRN
  Administered 2020-01-14: 75 ug/kg/min via INTRAVENOUS

## 2020-01-14 MED ORDER — BUPIVACAINE HCL (PF) 0.5 % IJ SOLN
INTRAMUSCULAR | Status: DC | PRN
  Administered 2020-01-14: 2.5 mL

## 2020-01-14 MED ORDER — ACETAMINOPHEN 325 MG PO TABS
325.0000 mg | ORAL_TABLET | Freq: Four times a day (QID) | ORAL | Status: DC | PRN
  Administered 2020-01-16: 650 mg via ORAL
  Filled 2020-01-14: qty 2

## 2020-01-14 MED ORDER — METOCLOPRAMIDE HCL 10 MG PO TABS: 5.0000 mg | ORAL_TABLET | Freq: Three times a day (TID) | ORAL | Status: DC | PRN

## 2020-01-14 MED ORDER — FENTANYL CITRATE (PF) 100 MCG/2ML IJ SOLN
INTRAMUSCULAR | Status: DC | PRN
  Administered 2020-01-14 (×2): 25 ug via INTRAVENOUS

## 2020-01-14 MED ORDER — CEFAZOLIN SODIUM-DEXTROSE 2-4 GM/100ML-% IV SOLN
2.0000 g | Freq: Four times a day (QID) | INTRAVENOUS | Status: AC
  Administered 2020-01-14 – 2020-01-15 (×3): 2 g via INTRAVENOUS
  Filled 2020-01-14 (×3): qty 100

## 2020-01-14 MED ORDER — FUROSEMIDE 20 MG PO TABS
20.0000 mg | ORAL_TABLET | Freq: Every day | ORAL | Status: DC
  Administered 2020-01-14: 20 mg via ORAL
  Filled 2020-01-14: qty 1

## 2020-01-14 MED ORDER — FLEET ENEMA 7-19 GM/118ML RE ENEM: 1.0000 | ENEMA | Freq: Once | RECTAL | Status: DC | PRN

## 2020-01-14 MED ORDER — CEFAZOLIN SODIUM-DEXTROSE 2-4 GM/100ML-% IV SOLN
INTRAVENOUS | Status: AC
  Filled 2020-01-14: qty 100

## 2020-01-14 MED ORDER — SODIUM BICARBONATE 650 MG PO TABS
1300.0000 mg | ORAL_TABLET | Freq: Three times a day (TID) | ORAL | Status: DC
  Administered 2020-01-14 – 2020-01-19 (×14): 1300 mg via ORAL
  Filled 2020-01-14 (×17): qty 2

## 2020-01-14 MED ORDER — ATORVASTATIN CALCIUM 20 MG PO TABS
80.0000 mg | ORAL_TABLET | Freq: Every day | ORAL | Status: DC
  Administered 2020-01-14 – 2020-01-18 (×5): 80 mg via ORAL
  Filled 2020-01-14 (×5): qty 4

## 2020-01-14 MED ORDER — PROPOFOL 10 MG/ML IV BOLUS
INTRAVENOUS | Status: AC
  Filled 2020-01-14: qty 20

## 2020-01-14 MED ORDER — ACETAMINOPHEN 500 MG PO TABS
500.0000 mg | ORAL_TABLET | Freq: Four times a day (QID) | ORAL | Status: AC
  Administered 2020-01-14 – 2020-01-15 (×4): 500 mg via ORAL
  Filled 2020-01-14 (×4): qty 1

## 2020-01-14 MED ORDER — HYDROCODONE-ACETAMINOPHEN 7.5-325 MG PO TABS
1.0000 | ORAL_TABLET | Freq: Once | ORAL | Status: DC | PRN
  Filled 2020-01-14: qty 1

## 2020-01-14 SURGICAL SUPPLY — 46 items
BARRIER SKIN 2 3/4 (OSTOMY) ×2 IMPLANT
BARRIER SKIN 2 3/4 INCH (OSTOMY) ×1
BIT DRILL 4.3MMS DISTAL GRDTED (BIT) ×1 IMPLANT
BNDG COHESIVE 4X5 TAN STRL (GAUZE/BANDAGES/DRESSINGS) ×3 IMPLANT
BNDG COHESIVE 6X5 TAN STRL LF (GAUZE/BANDAGES/DRESSINGS) ×3 IMPLANT
CANISTER SUCT 1200ML W/VALVE (MISCELLANEOUS) ×3 IMPLANT
CHLORAPREP W/TINT 26 (MISCELLANEOUS) ×6 IMPLANT
COVER WAND RF STERILE (DRAPES) ×3 IMPLANT
DRAPE 3/4 80X56 (DRAPES) ×3 IMPLANT
DRAPE C-ARMOR (DRAPES) ×3 IMPLANT
DRILL 4.3MMS DISTAL GRADUATED (BIT) ×3
DRSG OPSITE POSTOP 3X4 (GAUZE/BANDAGES/DRESSINGS) ×6 IMPLANT
DRSG OPSITE POSTOP 4X6 (GAUZE/BANDAGES/DRESSINGS) ×3 IMPLANT
ELECT CAUTERY BLADE 6.4 (BLADE) ×3 IMPLANT
ELECT REM PT RETURN 9FT ADLT (ELECTROSURGICAL) ×3
ELECTRODE REM PT RTRN 9FT ADLT (ELECTROSURGICAL) ×1 IMPLANT
GAUZE SPONGE 4X4 12PLY STRL (GAUZE/BANDAGES/DRESSINGS) ×3 IMPLANT
GLOVE BIO SURGEON STRL SZ8 (GLOVE) ×6 IMPLANT
GLOVE INDICATOR 8.0 STRL GRN (GLOVE) ×3 IMPLANT
GOWN STRL REUS W/ TWL LRG LVL3 (GOWN DISPOSABLE) ×1 IMPLANT
GOWN STRL REUS W/ TWL XL LVL3 (GOWN DISPOSABLE) ×1 IMPLANT
GOWN STRL REUS W/TWL LRG LVL3 (GOWN DISPOSABLE) ×2
GOWN STRL REUS W/TWL XL LVL3 (GOWN DISPOSABLE) ×2
GUIDEPIN VERSANAIL DSP 3.2X444 (ORTHOPEDIC DISPOSABLE SUPPLIES) ×3 IMPLANT
GUIDEWIRE BALL NOSE 100CM (WIRE) ×3 IMPLANT
HFN LH 130 DEG 11MM X 380MM (Orthopedic Implant) ×3 IMPLANT
MAT ABSORB  FLUID 56X50 GRAY (MISCELLANEOUS) ×2
MAT ABSORB FLUID 56X50 GRAY (MISCELLANEOUS) ×1 IMPLANT
NEEDLE FILTER BLUNT 18X 1/2SAF (NEEDLE) ×2
NEEDLE FILTER BLUNT 18X1 1/2 (NEEDLE) ×1 IMPLANT
NEEDLE HYPO 22GX1.5 SAFETY (NEEDLE) ×3 IMPLANT
NS IRRIG 500ML POUR BTL (IV SOLUTION) ×3 IMPLANT
PACK HIP COMPR (MISCELLANEOUS) ×3 IMPLANT
PASTE STOMA BARRIER 2OZ (OSTOMY) ×3 IMPLANT
POUCH OSTOMY 2 3/4  H 3804 (WOUND CARE) ×2
POUCH OSTOMY 2 PC DRNBL 2.75 (WOUND CARE) ×1 IMPLANT
SCREW BONE CORTICAL 5.0X42 (Screw) ×3 IMPLANT
SCREW LAG 10.5MMX105MM HFN (Screw) ×3 IMPLANT
STAPLER SKIN PROX 35W (STAPLE) ×3 IMPLANT
STRAP SAFETY 5IN WIDE (MISCELLANEOUS) ×3 IMPLANT
SUT VIC AB 0 CT1 36 (SUTURE) ×3 IMPLANT
SUT VIC AB 1 CT1 36 (SUTURE) ×3 IMPLANT
SUT VIC AB 2-0 CT1 (SUTURE) ×6 IMPLANT
SYR 10ML LL (SYRINGE) ×3 IMPLANT
SYR 30ML LL (SYRINGE) ×3 IMPLANT
TAPE MICROFOAM 4IN (TAPE) IMPLANT

## 2020-01-14 NOTE — ED Notes (Signed)
Family at bedside. 

## 2020-01-14 NOTE — NC FL2 (Signed)
[place] LEVEL OF CARE SCREENING TOOL     IDENTIFICATION  Patient Name: Bryan Brewer Birthdate: 1940/09/08 Sex: male Admission Date (Current Location): 01/13/2020  Bryan Brewer and [place] Number:  Engineering geologist and Address:  [facility], [address], [place], [place] [address]      Provider Number: [id number]  Attending Physician Name and Address:  Bryan Bi, MD  Relative Name and Phone Number:  Bryan Brewer  [phone]    Current Level of Care: Hospital Recommended Level of Care:   Prior Approval Number:    Date Approved/Denied:   PASRR Number: [id number]  Discharge Plan: SNF    Current Diagnoses: Patient Active Problem List   Diagnosis Date Noted  . Presence of urostomy ([place]) 01/14/2020  . Closed left hip fracture, initial encounter ([place]) 01/13/2020  . Accidental fall 01/13/2020  . Preoperative clearance 01/13/2020  . Hyperkalemia 12/17/2019  . Abdominal pain 09/18/2019  . Iron deficiency anemia 07/14/2019  . PSVT (paroxysmal supraventricular tachycardia) ([place]) 06/27/2019  . Aortic atherosclerosis ([place]) 06/16/2019  . Paroxysmal atrial fibrillation ([place]) 06/15/2019  . Bladder cancer ([place]) 06/02/2019  . BPH (benign prostatic hyperplasia) 06/02/2019  . Anemia 05/21/2019  . Chronic heart failure with preserved ejection fraction (HFpEF) ([place]) 05/14/2019  . Coronary artery disease involving native coronary artery of native heart without angina pectoris 05/14/2019  . Cellulitis of lower extremity 05/14/2019  . Skin ulcer of calf, limited to breakdown of skin ([place]) 05/14/2019  . Hypoglycemia 04/29/2019  . Hypotension due to hypovolemia 04/29/2019  . Tachycardia   . GIB (gastrointestinal bleeding) 04/20/2019  . Pressure injury of skin 03/31/2019  . HCAP (healthcare-associated pneumonia) 03/29/2019  . UTI (urinary tract infection) 03/29/2019  . Severe sepsis with septic shock ([place]) 03/29/2019  . Hyponatremia  03/29/2019  . Acute on chronic renal failure ([place]) 02/14/2019  . Osteoarthritis of knee 01/06/2019  . Chronic systolic CHF (congestive heart failure) ([place]) 11/20/2018  . Primary osteoarthritis involving multiple joints 08/02/2018  . Presence of aortocoronary bypass graft 11/08/2017  . NSTEMI (non-ST elevated myocardial infarction) ([place]) 10/30/2017  . Diabetes ([place]) 10/30/2017  . CKD (chronic kidney disease) stage 4, GFR 15-29 ml/min (HCC) 10/30/2017  . HTN (hypertension) 10/30/2017  . HLD (hyperlipidemia) 10/30/2017  . COPD (chronic obstructive pulmonary disease) ([place]) 10/30/2017  . Spinal stenosis of lumbar region 08/20/2017  . Lumbar radiculopathy 08/09/2017  . Hx of bladder cancer 04/30/2016  . Pure hypercholesterolemia 09/08/2015  . Simple chronic bronchitis ([place]) 09/08/2015  . Hypomagnesemia 03/30/2015  . Benign essential hypertension 03/30/2015  . Abdominal wall hernia at previous stoma site 01/12/2013  . Diarrhea in adult patient 01/12/2013    Orientation RESPIRATION BLADDER Height & Weight     Self, Time, Situation, Place  Normal External catheter Weight: 144 lb (65.3 kg) Height:  5\' 7"  (170.2 cm)  BEHAVIORAL SYMPTOMS/MOOD NEUROLOGICAL BOWEL NUTRITION STATUS      Continent Diet (heart healthy thin liquids)  AMBULATORY STATUS COMMUNICATION OF NEEDS Skin   Limited Assist Verbally Surgical wounds                       Personal Care Assistance Level of Assistance  Bathing, Dressing Bathing Assistance: Limited assistance   Dressing Assistance: Limited assistance     Functional Limitations Info             SPECIAL CARE FACTORS FREQUENCY  PT (By licensed PT), OT (By licensed OT)     PT Frequency: 5x  week OT Frequency: 5x week            Contractures Contractures Info: Not present    Additional Factors Info  Code Status, Isolation Precautions, Allergies Code Status Info: Full Code Allergies Info: NKDA     Isolation Precautions Info: MRSA past  admissions     Current Medications (01/14/2020):  This is the current hospital active medication list Current Facility-Administered Medications  Medication Dose Route Frequency Provider Last Rate Last Admin  . [MAR Hold] ceFAZolin (ANCEF) IVPB 2g/100 mL premix  2 g Intravenous 30 min Pre-Op Brewer, Bryan Cork, MD      . dextrose 5 %-0.45 % sodium chloride infusion   Intravenous Continuous Bryan Snow, NP 50 mL/hr at 01/14/20 1235 New Bag at 01/14/20 1235  . [MAR Hold] HYDROcodone-acetaminophen (NORCO/VICODIN) 5-325 MG per tablet 1-2 tablet  1-2 tablet Oral Q6H PRN Bryan Masse, MD      . Bryan Brewer Hold] insulin aspart (novoLOG) injection 0-15 Units  0-15 Units Subcutaneous Q4H Bryan Masse, MD   3 Units at 01/14/20 0002  . [MAR Hold] morphine 2 MG/ML injection 0.5 mg  0.5 mg Intravenous Q2H PRN Bryan Masse, MD   0.5 mg at 01/14/20 0358  . [MAR Hold] senna-docusate (Senokot-S) tablet 1 tablet  1 tablet Oral QHS PRN Bryan Masse, MD       Facility-Administered Medications Ordered in Other Encounters  Medication Dose Route Frequency Provider Last Rate Last Admin  . propofol (DIPRIVAN) 10 mg/mL bolus/IV push   Intravenous Anesthesia Intra-op Bryan Memory, CRNA   20 mg at 01/14/20 1412     Discharge Medications: Please see discharge summary for a list of discharge medications.  Relevant Imaging Results:  Relevant Lab Results:   Additional Information BFX:[id number]  Bryan Brewer, Bryan Rhyme, Bryan Brewer

## 2020-01-14 NOTE — ED Notes (Signed)
Assigned bed @ 1125, spoke with [employer].

## 2020-01-14 NOTE — ED Notes (Signed)
Report called to SDS. Tentative surgery scheduled for 1130.

## 2020-01-14 NOTE — Consult Note (Signed)
ORTHOPAEDIC CONSULTATION  REQUESTING PHYSICIAN: Enzo Bi, MD  Chief Complaint:   Left hip pain.  History of Present Illness: Bryan Brewer is a 79 y.o. male with multiple medical problems including coronary artery disease, congestive heart failure, chronic kidney disease, hypertension, hyperlipidemia, several episodes of V. tach, and abdominal aortic aneurysm, and a history of bladder cancer resulting in having to wear a urostostomy bag. The patient lives independently with his wife and is a Hydrographic surveyor. The patient was in his usual state of health yesterday evening when he apparently slipped and fell while getting out of his truck, landing on his left hip. He was unable to ambulate and so was brought to the emergency room where x-rays demonstrated a displaced intertrochanteric fracture of the left hip. The patient has been admitted for definitive management of this injury. He denies any associated injuries, other than some scrapes to the posterior aspect of his left elbow. He did not strike his head or lose consciousness. In addition, he denies any lightheadedness, dizziness, chest pain, shortness of breath, or other symptoms which may have precipitated his fall.  Past Medical History:  Diagnosis Date  . (HFpEF) heart failure with preserved ejection fraction ([place])    a. 03/2019 Echo: EF 60-65%, nl RV fxn.  Marland Kitchen AAA (abdominal aortic aneurysm) ([place]) 09/2019   3.0 infrarenal AAA incidentally noted on abdominal/pelvic CT  . Bladder cancer ([place])   . C. difficile colitis 04/2019  . CKD (chronic kidney disease), stage IV ([place])   . COPD (chronic obstructive pulmonary disease) ([place])   . Coronary artery disease    a. 10/2017 NSTEMI/Cath: LM 59m 75d, LAD 75ost, 90p, D1 100, LCX 782m, OM1 90, RCA 25p; b. 10/2017 CABG x 4 ([facility]): LIMA->LAD, VG->Diag, VG->OM1, VG->OM2.  . Hyperlipidemia   . Hypertension   . NSVT (nonsustained  ventricular tachycardia) [place]   a. 06/2019 [facility]: 2 runs of NSVT up to 12 beats, max rate 187 bpm.  . PSVT (paroxysmal supraventricular tachycardia) ([place]   06/2019 [facility]: 1820 episodes of SVT lasting up to 00:01:16 w/ max rate of 210.  . Type 2 diabetes mellitus ([place]   Past Surgical History:  Procedure Laterality Date  . COLONOSCOPY    . CORONARY ARTERY BYPASS GRAFT  2019   [facility]; LIMA-LAD, SVG-diagonal, SVG-OM1, and SVG-OM2  . ESOPHAGOGASTRODUODENOSCOPY N/A 04/23/2019   Procedure: ESOPHAGOGASTRODUODENOSCOPY (EGD);  Surgeon: AnJonathon BellowsMD;  [facility];  Service: Gastroenterology;  Laterality: N/A;  . ESOPHAGOGASTRODUODENOSCOPY (EGD) WITH PROPOFOL N/A 04/21/2019   Procedure: ESOPHAGOGASTRODUODENOSCOPY (EGD) WITH PROPOFOL;  Surgeon: AnJonathon BellowsMD;  [facility];  Service: Gastroenterology;  Laterality: N/A;  . LEFT HEART CATH AND CORONARY ANGIOGRAPHY N/A 10/31/2017   Procedure: LEFT HEART CATH AND CORONARY ANGIOGRAPHY;  Surgeon: CaYolonda KidaMD;  [facility];  Service: Cardiovascular;  Laterality: N/A;  . REVISION UROSTOMY CUTANEOUS     Social History   Socioeconomic History  . Marital status: Married    Spouse name: Not on file  . Number of children: Not on file  . Years of education: Not on file  . Highest education level: Not on file  Occupational History  . Not on file  Tobacco Use  . Smoking status: Former Smoker    Packs/day: 1.50    Years: 35.00    Pack years: 52.50    Types: Cigarettes    Quit date: 2013    Years since quitting: 8.4  . Smokeless tobacco: Never Used  Vaping Use  . Vaping Use: Never used  Substance and Sexual Activity  . Alcohol use: No  . Drug use: Never  . Sexual activity: Not Currently  Other Topics Concern  . Not on file  Social History Narrative  . Not on file   Social Determinants of Health   Financial Resource Strain: Low Risk   . Difficulty of Paying Living Expenses: Not hard at all   Food Insecurity: Unknown  . Worried About Charity fundraiser in the Last Year: Patient refused  . Ran Out of Food in the Last Year: Patient refused  Transportation Needs: Unknown  . Lack of Transportation (Medical): Patient refused  . Lack of Transportation (Non-Medical): Patient refused  Physical Activity: Unknown  . Days of Exercise per Week: Patient refused  . Minutes of Exercise per Session: Patient refused  Stress: Unknown  . Feeling of Stress : Patient refused  Social Connections: Unknown  . Frequency of Communication with Friends and Family: Patient refused  . Frequency of Social Gatherings with Friends and Family: Patient refused  . Attends Religious Services: Patient refused  . Active Member of Clubs or Organizations: Patient refused  . Attends Archivist Meetings: Patient refused  . Marital Status: Patient refused   Family History  Problem Relation Age of Onset  . CAD Mother   . Stroke Mother   . CAD Father   . Stroke Father   . Breast cancer Sister    No Known Allergies Prior to Admission medications   Medication Sig Start Date End Date Taking? Authorizing Provider  aspirin EC 81 MG tablet Take 81 mg by mouth every evening.    Yes [provider]  atorvastatin (LIPITOR) 80 MG tablet Take 80 mg by mouth at bedtime.   Yes [provider]  blood glucose meter kit and supplies KIT Dispense based on patient and insurance preference. Use up to four times daily as directed. (FOR ICD-9 250.00, 250.01). 04/04/19  Yes Gladstone Lighter, MD  furosemide (LASIX) 20 MG tablet Take 1 tablet (20 mg total) by mouth daily. 09/08/19 01/14/20 Yes Theora Gianotti, NP  glipiZIDE (GLUCOTROL) 5 MG tablet Take 0.5 tablets (2.5 mg total) by mouth daily before breakfast. 05/01/19 06/30/28 Yes Sainani, Belia Heman, MD  ipratropium-albuterol (DUONEB) 0.5-2.5 (3) MG/3ML SOLN Take 3 mLs by nebulization every 6 (six) hours as needed (wheezing, shortness of breath).  04/04/19  Yes Gladstone Lighter, MD  metoprolol tartrate (LOPRESSOR) 25 MG tablet Take 1 tablet (25 mg total) by mouth 2 (two) times daily. 06/26/19 01/14/20 Yes End, Harrell Gave, MD  pantoprazole (PROTONIX) 40 MG tablet Take 1 tablet (40 mg total) by mouth daily. 04/25/19 04/24/20 Yes Dustin Flock, MD  sodium bicarbonate 650 MG tablet Take 1,300 mg by mouth 3 (three) times daily.   Yes [provider]  mometasone-formoterol (DULERA) 200-5 MCG/ACT AERO Inhale 2 puffs into the lungs 2 (two) times daily. Patient not taking: Reported on 01/14/2020 04/04/19   Gladstone Lighter, MD   DG Elbow Complete Left  Result Date: 01/13/2020 CLINICAL DATA:  Status post EXAM: LEFT ELBOW - COMPLETE 3+ VIEW COMPARISON:  None. FINDINGS: There is no evidence of fracture, dislocation, or joint effusion. There is no evidence of arthropathy or other focal bone abnormality. Soft tissues are unremarkable. IMPRESSION: Negative. Electronically Signed   By: Constance Holster M.D.   On: 01/13/2020 22:17   CT Head Wo Contrast  Result Date: 01/13/2020 CLINICAL DATA:  Head trauma, headache, fall exiting tract EXAM:  CT HEAD WITHOUT CONTRAST CT CERVICAL SPINE WITHOUT CONTRAST TECHNIQUE: Multidetector CT imaging of the head and cervical spine was performed following the standard protocol without intravenous contrast. Multiplanar CT image reconstructions of the cervical spine were also generated. COMPARISON:  PET-CT 10/15/2019 FINDINGS: CT HEAD FINDINGS Brain: No evidence of acute infarction, hemorrhage, hydrocephalus, extra-axial collection or mass lesion/mass effect. Symmetric prominence of the ventricles, cisterns and sulci compatible with parenchymal volume loss. Patchy areas of white matter hypoattenuation are most compatible with chronic microvascular angiopathy. Vascular: Atherosclerotic calcification of the carotid siphons and intradural vertebral arteries. No hyperdense vessel. Skull: No calvarial fracture or suspicious  osseous lesion. No scalp swelling or hematoma. Sinuses/Orbits: Paranasal sinuses and mastoid air cells are predominantly clear. Orbital structures are unremarkable aside from prior lens extractions. Other: Rightward nasal septal deviation with a contacting right-sided nasal septal spur. Minimal nonspecific cutaneous thickening of the right malar soft tissues, correlate with visual inspection. CT CERVICAL SPINE FINDINGS Alignment: Stabilization collar absent at the time of examination. There is mild rightward cranial rotation. No evidence of traumatic listhesis. No abnormally widened, perched or jumped facets. Normal alignment of the craniocervical and atlantoaxial articulations accounting for the degree of rotation. Skull base and vertebrae: No skull base or vertebral body fractures. No vertebral body height loss. No worrisome osseous lesions. Soft tissues and spinal canal: No pre or paravertebral fluid or swelling. No visible canal hematoma. Disc levels: Multilevel intervertebral disc height loss with spondylitic endplate changes. Large disc osteophyte complex at C6-7 results in mild canal stenosis. Smaller complex at C5-6 effaces the ventral thecal sac without significant canal stenosis. Multilevel uncinate spurring and facet hypertrophic changes are also maximal at these levels with at most mild to moderate foraminal narrowing. Upper chest: Extensive centrilobular and paraseptal emphysematous changes in the lung apices. No consolidation or acute cardiopulmonary abnormality. Additional biapical pleuroparenchymal scarring. Other: Extensive vascular calcium throughout the proximal great vessels and cervical carotids. Normal thyroid. IMPRESSION: 1. No evidence of acute intracranial abnormality. No significant scalp swelling or calvarial fracture. 2. Chronic microvascular angiopathy and parenchymal volume loss. 3. No evidence of acute fracture or traumatic listhesis of the cervical spine. 4. Multilevel degenerative  disc disease and facet hypertrophic changes of the cervical spine, most prominent at C6-7 where there is mild canal stenosis. 5. Extensive vascular calcium throughout the proximal great vessels and cervical carotids. 6. Focal nonspecific skin thickening of the right malar soft tissues, correlate with visual inspection. 7. Emphysema (ICD10-J43.9). Electronically Signed   By: Lovena Le M.D.   On: 01/13/2020 22:43   CT Cervical Spine Wo Contrast  Result Date: 01/13/2020 CLINICAL DATA:  Head trauma, headache, fall exiting tract EXAM: CT HEAD WITHOUT CONTRAST CT CERVICAL SPINE WITHOUT CONTRAST TECHNIQUE: Multidetector CT imaging of the head and cervical spine was performed following the standard protocol without intravenous contrast. Multiplanar CT image reconstructions of the cervical spine were also generated. COMPARISON:  PET-CT 10/15/2019 FINDINGS: CT HEAD FINDINGS Brain: No evidence of acute infarction, hemorrhage, hydrocephalus, extra-axial collection or mass lesion/mass effect. Symmetric prominence of the ventricles, cisterns and sulci compatible with parenchymal volume loss. Patchy areas of white matter hypoattenuation are most compatible with chronic microvascular angiopathy. Vascular: Atherosclerotic calcification of the carotid siphons and intradural vertebral arteries. No hyperdense vessel. Skull: No calvarial fracture or suspicious osseous lesion. No scalp swelling or hematoma. Sinuses/Orbits: Paranasal sinuses and mastoid air cells are predominantly clear. Orbital structures are unremarkable aside from prior lens extractions. Other: Rightward nasal septal deviation with a contacting right-sided  nasal septal spur. Minimal nonspecific cutaneous thickening of the right malar soft tissues, correlate with visual inspection. CT CERVICAL SPINE FINDINGS Alignment: Stabilization collar absent at the time of examination. There is mild rightward cranial rotation. No evidence of traumatic listhesis. No abnormally  widened, perched or jumped facets. Normal alignment of the craniocervical and atlantoaxial articulations accounting for the degree of rotation. Skull base and vertebrae: No skull base or vertebral body fractures. No vertebral body height loss. No worrisome osseous lesions. Soft tissues and spinal canal: No pre or paravertebral fluid or swelling. No visible canal hematoma. Disc levels: Multilevel intervertebral disc height loss with spondylitic endplate changes. Large disc osteophyte complex at C6-7 results in mild canal stenosis. Smaller complex at C5-6 effaces the ventral thecal sac without significant canal stenosis. Multilevel uncinate spurring and facet hypertrophic changes are also maximal at these levels with at most mild to moderate foraminal narrowing. Upper chest: Extensive centrilobular and paraseptal emphysematous changes in the lung apices. No consolidation or acute cardiopulmonary abnormality. Additional biapical pleuroparenchymal scarring. Other: Extensive vascular calcium throughout the proximal great vessels and cervical carotids. Normal thyroid. IMPRESSION: 1. No evidence of acute intracranial abnormality. No significant scalp swelling or calvarial fracture. 2. Chronic microvascular angiopathy and parenchymal volume loss. 3. No evidence of acute fracture or traumatic listhesis of the cervical spine. 4. Multilevel degenerative disc disease and facet hypertrophic changes of the cervical spine, most prominent at C6-7 where there is mild canal stenosis. 5. Extensive vascular calcium throughout the proximal great vessels and cervical carotids. 6. Focal nonspecific skin thickening of the right malar soft tissues, correlate with visual inspection. 7. Emphysema (ICD10-J43.9). Electronically Signed   By: Lovena Le M.D.   On: 01/13/2020 22:43   DG Chest Portable 1 View  Result Date: 01/13/2020 CLINICAL DATA:  Pain EXAM: PORTABLE CHEST 1 VIEW COMPARISON:  04/29/2019 FINDINGS: The heart size is stable but  enlarged. The patient is status post prior median sternotomy. There are aortic calcifications. There is mild vascular congestion without overt pulmonary edema. There is no pneumothorax. No significant pleural effusion. IMPRESSION: Cardiomegaly with mild vascular congestion. No overt pulmonary edema or significant pleural effusion. Electronically Signed   By: Constance Holster M.D.   On: 01/13/2020 22:17   DG Shoulder Left  Result Date: 01/13/2020 CLINICAL DATA:  Pain status post fall EXAM: LEFT SHOULDER - 2+ VIEW COMPARISON:  None. FINDINGS: There is no evidence of fracture or dislocation. There is no evidence of arthropathy or other focal bone abnormality. Soft tissues are unremarkable. IMPRESSION: Negative. Electronically Signed   By: Constance Holster M.D.   On: 01/13/2020 22:18   DG Hip Unilat W or Wo Pelvis 2-3 Views Left  Result Date: 01/13/2020 CLINICAL DATA:  Pain EXAM: DG HIP (WITH OR WITHOUT PELVIS) 2-3V LEFT COMPARISON:  None. FINDINGS: There is an acute displaced, comminuted intratrochanteric fracture of the proximal left femur. There is no dislocation. There are moderate degenerative changes of the left hip. There are mild degenerative changes of the right hip. IMPRESSION: Acute displaced, comminuted intratrochanteric fracture of the proximal left femur. Electronically Signed   By: Constance Holster M.D.   On: 01/13/2020 22:16    Positive ROS: All other systems have been reviewed and were otherwise negative with the exception of those mentioned in the HPI and as above.  Physical Exam: General:  Alert, no acute distress Psychiatric:  Patient is competent for consent with normal mood and affect   Cardiovascular:  No pedal edema Respiratory:  No wheezing,  non-labored breathing GI:  Abdomen is soft and non-tender Skin:  No lesions in the area of chief complaint Neurologic:  Sensation intact distally Lymphatic:  No axillary or cervical lymphadenopathy  Orthopedic Exam:  Orthopedic  examination is limited to the left hip and lower extremity. The left lower extremity is somewhat shortened and externally rotated as compared to the right. Skin inspection around the left hip is unremarkable. No swelling, erythema, ecchymosis, abrasions, or other skin abnormalities are identified. He has mild tenderness palpation of the lateral aspect of the left hip. He has more severe pain with any attempted active or passive motion of the hip. He is able dorsiflex and plantarflex his toes and ankle. Sensation is intact to light touch to all distributions. He has good capillary refill to his left foot.  X-rays:  X-rays of the pelvis and left hip are available for review and have been reviewed by myself. These films demonstrate a displaced intertrochanteric fracture of the left hip. The hip joint itself appears to be well-maintained and without evidence for significant degenerative changes. No lytic lesions or other acute bony abnormalities are identified.  Assessment: Closed displaced left intertrochanteric hip fracture.  Plan: The treatment options have been discussed with the patient, including both surgical and nonsurgical choices. The patient would like to proceed with surgical intervention to include an intramedullary nailing of the displaced intertrochanteric fracture of the left hip. This procedure has been discussed in detail, as have the potential risks (including bleeding, infection, nerve and blood vessel injury, persistent or recurrent pain, stiffness, malunion and/or nonunion, need for further surgery, blood clots, strokes, heart attacks and/or arrhythmias, etc.) and benefits. The patient states his understanding and wishes to proceed. A formal written consent has been obtained by the nursing staff.  Thank you for asking me to participate in the care of this most pleasant man. I will be happy to follow him with you.   Pascal Lux, MD  Beeper #:  [phone]  01/14/2020 1:48  PM

## 2020-01-14 NOTE — Progress Notes (Signed)
PROGRESS NOTE    Bryan Brewer  LGX:[id number] DOB: 1940-11-21 DOA: 01/13/2020 PCP: Tracie Harrier, MD    Assessment & Plan:   Principal Problem:   Closed left hip fracture, initial encounter ([place]) Active Problems:   Diabetes ([place])   CKD (chronic kidney disease) stage 4, GFR 15-29 ml/min (HCC)   HTN (hypertension)   COPD (chronic obstructive pulmonary disease) (HCC)   Chronic heart failure with preserved ejection fraction (HFpEF) (HCC)   Coronary artery disease involving native coronary artery of native heart without angina pectoris   BPH (benign prostatic hyperplasia)   Accidental fall   Preoperative clearance   Presence of urostomy ([place])    Bryan Brewer is a 79 y.o. male with medical history significant for CAD, HFpEF, CKD 4, COPD, bladder cancer status post cystoprostatectomy with urostomy and ileal conduit, CAD s/p CABG followed by cardiologist, Dr. Saunders Revel, last seen on 12/16/2019, who presents to the emergency room after suffering an accidental fall while getting out of his truck.  He fell onto his left hip also with impact to the left shoulder and elbow.   1. Closed left hip fracture, initial encounter s/p INTRAMEDULLARY (IM) NAIL INTERTROCHANTRIC -Patient presents with acute displaced left intertrochanteric hip fracture after an accidental fall trying to get out of his truck -Patient has cardiac history to include CAD, CABG and diastolic heart failure but was recently evaluated by his cardiologist on 12/16/2019 and found to be stable PLAN: -OR with Dr. Roland Rack today --Pain management --PT after    Diabetes mellitus type II ([place]) -Sliding scale insulin coverage    CKD (chronic kidney disease) stage 4, GFR 15-29 ml/min ([place]) -Renal function at baseline -Continue sodium bicarbonate.  History of bladder cancer status post cystoprostatectomy with urostomy and ileal conduit -Urostomy care per pt  Recurrent inguinal hernias    HTN (hypertension) --Hold home lasix  and metop due to low BP --continue MIVF    COPD (chronic obstructive pulmonary disease) (HCC) -Not acutely exacerbated -DuoNebs as needed    Chronic heart failure with preserved ejection fraction (HFpEF) ([place]) -Patient is euvolemic --Hold home lasix and metop due to low BP    Coronary artery disease involving native coronary artery of native heart without angina pectoris -No complaints of chest pain and EKG with no acute ST-T wave changes --continue home aspirin, atorvastatin   Pressure Injury 01/14/20 Sacrum Stage 2, POA   DVT prophylaxis: Lovenox SQ Code Status: Full code  Family Communication: wife updated at bedside today Status is: inpatient Dispo:   The patient is from: home Anticipated d/c is to: SNF rehab Anticipated d/c date is: 2-3 days Patient currently is not medically stable to d/c due to: just had surgery, need surgery clearance, may need placement.   Subjective and Interval History:  Pt went for left INTRAMEDULLARY (IM) NAIL INTERTROCHANTRIC surgery today.  Tolerated it well.  Post-op, pt complained of burning around his urostomy site, which was found to be due to urine leaking and irritating the skin under the abdominal skin fold.  No fever, dyspnea, chest pain, N/V/D.    Objective: Vitals:   01/15/20 0400 01/15/20 0725 01/15/20 1122 01/15/20 1530  BP: (!) 93/43 (!) 98/50 (!) 105/52 (!) 101/55  Pulse: 69 65 62 65  Resp: 18 17 16 17   Temp: 98.4 F (36.9 C) 98.6 F (37 C) 98 F (36.7 C) 97.7 F (36.5 C)  TempSrc: Oral Oral Oral Oral  SpO2: 96% 94% 96% 99%  Weight:  Height:        Intake/Output Summary (Last 24 hours) at 01/15/2020 1822 Last data filed at 01/15/2020 8110 Gross per 24 hour  Intake 1078.73 ml  Output 550 ml  Net 528.73 ml   Filed Weights   01/13/20 2134  Weight: 65.3 kg    Examination:   Constitutional: NAD, AAOx3 HEENT: conjunctivae and lids normal, EOMI CV: RRR no M,R,G. Distal pulses +2.  No cyanosis.   RESP:  CTA B/L, normal respiratory effort  GI: +BS, NTND, urostomy stoma healthy-appearing, outputting clear urine Extremities: No effusions, edema, or tenderness in BLE  Neuro: II - XII grossly intact.  Sensation intact Psych: Normal mood and affect.  Appropriate judgement and reason   Data Reviewed: I have personally reviewed following labs and imaging studies  CBC: Recent Labs  Lab 01/13/20 2134 01/15/20 0633  WBC 10.5 9.0  NEUTROABS 9.5*  --   HGB 10.2* 8.0*  HCT 32.9* 25.5*  MCV 89.4 88.2  PLT 313 315   Basic Metabolic Panel: Recent Labs  Lab 01/13/20 2134 01/14/20 0823 01/15/20 0633  NA 133* 136 134*  K 5.6* 5.1 5.2*  CL 107 111 111  CO2 17* 16* 14*  GLUCOSE 184* 124* 115*  BUN 65* 64* 60*  CREATININE 3.89* 3.77* 3.58*  CALCIUM 8.7* 8.8* 8.0*  MG  --   --  1.7   GFR: Estimated Creatinine Clearance: 15.7 mL/min (A) (by C-G formula based on SCr of 3.58 mg/dL (H)). Liver Function Tests: Recent Labs  Lab 01/13/20 2134  AST 10*  ALT 10  ALKPHOS 80  BILITOT 0.7  PROT 7.8  ALBUMIN 3.0*   No results for input(s): LIPASE, AMYLASE in the last 168 hours. No results for input(s): AMMONIA in the last 168 hours. Coagulation Profile: Recent Labs  Lab 01/13/20 2134  INR 1.3*   Cardiac Enzymes: No results for input(s): CKTOTAL, CKMB, CKMBINDEX, TROPONINI in the last 168 hours. BNP (last 3 results) No results for input(s): PROBNP in the last 8760 hours. HbA1C: Recent Labs    01/13/20 2143  HGBA1C 6.4*   CBG: Recent Labs  Lab 01/15/20 0403 01/15/20 0722 01/15/20 1124 01/15/20 1234 01/15/20 1530  GLUCAP 88 109* 67* 88 126*   Lipid Profile: No results for input(s): CHOL, HDL, LDLCALC, TRIG, CHOLHDL, LDLDIRECT in the last 72 hours. Thyroid Function Tests: No results for input(s): TSH, T4TOTAL, FREET4, T3FREE, THYROIDAB in the last 72 hours. Anemia Panel: No results for input(s): VITAMINB12, FOLATE, FERRITIN, TIBC, IRON, RETICCTPCT in the last 72  hours. Sepsis Labs: No results for input(s): PROCALCITON, LATICACIDVEN in the last 168 hours.  Recent Results (from the past 240 hour(s))  SARS Coronavirus 2 by RT PCR (hospital order, performed in [facility] hospital lab) Nasopharyngeal Nasopharyngeal Swab     Status: None   Collection Time: 01/13/20 10:33 PM   Specimen: Nasopharyngeal Swab  Result Value Ref Range Status   SARS Coronavirus 2 NEGATIVE NEGATIVE Final    Comment: (NOTE) SARS-CoV-2 target nucleic acids are NOT DETECTED. The SARS-CoV-2 RNA is generally detectable in upper and lower respiratory specimens during the acute phase of infection. The lowest concentration of SARS-CoV-2 viral copies this assay can detect is 250 copies / mL. A negative result does not preclude SARS-CoV-2 infection and should not be used as the sole basis for treatment or other patient management decisions.  A negative result may occur with improper specimen collection / handling, submission of specimen other than nasopharyngeal swab, presence of viral mutation(s) within  the areas targeted by this assay, and inadequate number of viral copies (<250 copies / mL). A negative result must be combined with clinical observations, patient history, and epidemiological information. Fact Sheet for Patients:   StrictlyIdeas.no Fact Sheet for Healthcare Providers: BankingDealers.co.za This test is not yet approved or cleared  by the [place] FDA and has been authorized for detection and/or diagnosis of SARS-CoV-2 by FDA under an Emergency Use Authorization (EUA).  This EUA will remain in effect (meaning this test can be used) for the duration of the COVID-19 declaration under Section 564(b)(1) of the Act, 21 U.S.C. section 360bbb-3(b)(1), unless the authorization is terminated or revoked sooner. Performed at [facility], [address] [place], [place] [address]   MRSA PCR Screening     Status:  Abnormal   Collection Time: 01/15/20  9:44 AM   Specimen: Nasopharyngeal  Result Value Ref Range Status   MRSA by PCR POSITIVE (A) NEGATIVE Final    Comment:        The GeneXpert MRSA Assay (FDA approved for NASAL specimens only), is one component of a comprehensive MRSA colonization surveillance program. It is not intended to diagnose MRSA infection nor to guide or monitor treatment for MRSA infections. RESULT CALLED TO, READ BACK BY AND VERIFIED WITH: AMANDA FIELDS AT 1105 01/15/20.PMF Performed at [facility], [address] [place], [place] [address]       Radiology Studies: DG Elbow Complete Left  Result Date: 01/13/2020 CLINICAL DATA:  Status post EXAM: LEFT ELBOW - COMPLETE 3+ VIEW COMPARISON:  None. FINDINGS: There is no evidence of fracture, dislocation, or joint effusion. There is no evidence of arthropathy or other focal bone abnormality. Soft tissues are unremarkable. IMPRESSION: Negative. Electronically Signed   By: Constance Holster M.D.   On: 01/13/2020 22:17   CT Head Wo Contrast  Result Date: 01/13/2020 CLINICAL DATA:  Head trauma, headache, fall exiting tract EXAM: CT HEAD WITHOUT CONTRAST CT CERVICAL SPINE WITHOUT CONTRAST TECHNIQUE: Multidetector CT imaging of the head and cervical spine was performed following the standard protocol without intravenous contrast. Multiplanar CT image reconstructions of the cervical spine were also generated. COMPARISON:  PET-CT 10/15/2019 FINDINGS: CT HEAD FINDINGS Brain: No evidence of acute infarction, hemorrhage, hydrocephalus, extra-axial collection or mass lesion/mass effect. Symmetric prominence of the ventricles, cisterns and sulci compatible with parenchymal volume loss. Patchy areas of white matter hypoattenuation are most compatible with chronic microvascular angiopathy. Vascular: Atherosclerotic calcification of the carotid siphons and intradural vertebral arteries. No hyperdense vessel. Skull: No calvarial  fracture or suspicious osseous lesion. No scalp swelling or hematoma. Sinuses/Orbits: Paranasal sinuses and mastoid air cells are predominantly clear. Orbital structures are unremarkable aside from prior lens extractions. Other: Rightward nasal septal deviation with a contacting right-sided nasal septal spur. Minimal nonspecific cutaneous thickening of the right malar soft tissues, correlate with visual inspection. CT CERVICAL SPINE FINDINGS Alignment: Stabilization collar absent at the time of examination. There is mild rightward cranial rotation. No evidence of traumatic listhesis. No abnormally widened, perched or jumped facets. Normal alignment of the craniocervical and atlantoaxial articulations accounting for the degree of rotation. Skull base and vertebrae: No skull base or vertebral body fractures. No vertebral body height loss. No worrisome osseous lesions. Soft tissues and spinal canal: No pre or paravertebral fluid or swelling. No visible canal hematoma. Disc levels: Multilevel intervertebral disc height loss with spondylitic endplate changes. Large disc osteophyte complex at C6-7 results in mild canal stenosis. Smaller complex at C5-6 effaces the  ventral thecal sac without significant canal stenosis. Multilevel uncinate spurring and facet hypertrophic changes are also maximal at these levels with at most mild to moderate foraminal narrowing. Upper chest: Extensive centrilobular and paraseptal emphysematous changes in the lung apices. No consolidation or acute cardiopulmonary abnormality. Additional biapical pleuroparenchymal scarring. Other: Extensive vascular calcium throughout the proximal great vessels and cervical carotids. Normal thyroid. IMPRESSION: 1. No evidence of acute intracranial abnormality. No significant scalp swelling or calvarial fracture. 2. Chronic microvascular angiopathy and parenchymal volume loss. 3. No evidence of acute fracture or traumatic listhesis of the cervical spine. 4.  Multilevel degenerative disc disease and facet hypertrophic changes of the cervical spine, most prominent at C6-7 where there is mild canal stenosis. 5. Extensive vascular calcium throughout the proximal great vessels and cervical carotids. 6. Focal nonspecific skin thickening of the right malar soft tissues, correlate with visual inspection. 7. Emphysema (ICD10-J43.9). Electronically Signed   By: Lovena Le M.D.   On: 01/13/2020 22:43   CT Cervical Spine Wo Contrast  Result Date: 01/13/2020 CLINICAL DATA:  Head trauma, headache, fall exiting tract EXAM: CT HEAD WITHOUT CONTRAST CT CERVICAL SPINE WITHOUT CONTRAST TECHNIQUE: Multidetector CT imaging of the head and cervical spine was performed following the standard protocol without intravenous contrast. Multiplanar CT image reconstructions of the cervical spine were also generated. COMPARISON:  PET-CT 10/15/2019 FINDINGS: CT HEAD FINDINGS Brain: No evidence of acute infarction, hemorrhage, hydrocephalus, extra-axial collection or mass lesion/mass effect. Symmetric prominence of the ventricles, cisterns and sulci compatible with parenchymal volume loss. Patchy areas of white matter hypoattenuation are most compatible with chronic microvascular angiopathy. Vascular: Atherosclerotic calcification of the carotid siphons and intradural vertebral arteries. No hyperdense vessel. Skull: No calvarial fracture or suspicious osseous lesion. No scalp swelling or hematoma. Sinuses/Orbits: Paranasal sinuses and mastoid air cells are predominantly clear. Orbital structures are unremarkable aside from prior lens extractions. Other: Rightward nasal septal deviation with a contacting right-sided nasal septal spur. Minimal nonspecific cutaneous thickening of the right malar soft tissues, correlate with visual inspection. CT CERVICAL SPINE FINDINGS Alignment: Stabilization collar absent at the time of examination. There is mild rightward cranial rotation. No evidence of traumatic  listhesis. No abnormally widened, perched or jumped facets. Normal alignment of the craniocervical and atlantoaxial articulations accounting for the degree of rotation. Skull base and vertebrae: No skull base or vertebral body fractures. No vertebral body height loss. No worrisome osseous lesions. Soft tissues and spinal canal: No pre or paravertebral fluid or swelling. No visible canal hematoma. Disc levels: Multilevel intervertebral disc height loss with spondylitic endplate changes. Large disc osteophyte complex at C6-7 results in mild canal stenosis. Smaller complex at C5-6 effaces the ventral thecal sac without significant canal stenosis. Multilevel uncinate spurring and facet hypertrophic changes are also maximal at these levels with at most mild to moderate foraminal narrowing. Upper chest: Extensive centrilobular and paraseptal emphysematous changes in the lung apices. No consolidation or acute cardiopulmonary abnormality. Additional biapical pleuroparenchymal scarring. Other: Extensive vascular calcium throughout the proximal great vessels and cervical carotids. Normal thyroid. IMPRESSION: 1. No evidence of acute intracranial abnormality. No significant scalp swelling or calvarial fracture. 2. Chronic microvascular angiopathy and parenchymal volume loss. 3. No evidence of acute fracture or traumatic listhesis of the cervical spine. 4. Multilevel degenerative disc disease and facet hypertrophic changes of the cervical spine, most prominent at C6-7 where there is mild canal stenosis. 5. Extensive vascular calcium throughout the proximal great vessels and cervical carotids. 6. Focal nonspecific skin thickening of the  right malar soft tissues, correlate with visual inspection. 7. Emphysema (ICD10-J43.9). Electronically Signed   By: Lovena Le M.D.   On: 01/13/2020 22:43   DG Chest Portable 1 View  Result Date: 01/13/2020 CLINICAL DATA:  Pain EXAM: PORTABLE CHEST 1 VIEW COMPARISON:  04/29/2019 FINDINGS: The  heart size is stable but enlarged. The patient is status post prior median sternotomy. There are aortic calcifications. There is mild vascular congestion without overt pulmonary edema. There is no pneumothorax. No significant pleural effusion. IMPRESSION: Cardiomegaly with mild vascular congestion. No overt pulmonary edema or significant pleural effusion. Electronically Signed   By: Constance Holster M.D.   On: 01/13/2020 22:17   DG Shoulder Left  Result Date: 01/13/2020 CLINICAL DATA:  Pain status post fall EXAM: LEFT SHOULDER - 2+ VIEW COMPARISON:  None. FINDINGS: There is no evidence of fracture or dislocation. There is no evidence of arthropathy or other focal bone abnormality. Soft tissues are unremarkable. IMPRESSION: Negative. Electronically Signed   By: Constance Holster M.D.   On: 01/13/2020 22:18   DG HIP OPERATIVE UNILAT WITH PELVIS LEFT  Result Date: 01/14/2020 CLINICAL DATA:  Left hip ORIF EXAM: OPERATIVE LEFT HIP (WITH PELVIS IF PERFORMED) 2 VIEWS TECHNIQUE: Fluoroscopic spot image(s) were submitted for interpretation post-operatively. COMPARISON:  01/13/2020 FINDINGS: 4 C-arm fluoroscopic images were obtained intraoperatively and submitted for post operative interpretation. Interval placement of anterograde long intramedullary nail with proximal lag screw and distal interlocking screw traversing intertrochanteric fracture the proximal left femur. Improved osseous alignment. Please see the performing provider's procedural report for further detail. IMPRESSION: As above. Electronically Signed   By: Davina Poke D.O.   On: 01/14/2020 18:02   DG Hip Unilat W or Wo Pelvis 2-3 Views Left  Result Date: 01/13/2020 CLINICAL DATA:  Pain EXAM: DG HIP (WITH OR WITHOUT PELVIS) 2-3V LEFT COMPARISON:  None. FINDINGS: There is an acute displaced, comminuted intratrochanteric fracture of the proximal left femur. There is no dislocation. There are moderate degenerative changes of the left hip. There are  mild degenerative changes of the right hip. IMPRESSION: Acute displaced, comminuted intratrochanteric fracture of the proximal left femur. Electronically Signed   By: Constance Holster M.D.   On: 01/13/2020 22:16     Scheduled Meds: . aspirin EC  81 mg Oral QPM  . atorvastatin  80 mg Oral QHS  . Chlorhexidine Gluconate Cloth  6 each Topical Q0600  . docusate sodium  100 mg Oral BID  . enoxaparin (LOVENOX) injection  30 mg Subcutaneous Q24H  . feeding supplement (ENSURE ENLIVE)  237 mL Oral BID BM  . glipiZIDE  2.5 mg Oral QAC breakfast  . insulin aspart  0-15 Units Subcutaneous Q4H  . mupirocin ointment  1 application Nasal BID  . nutrition supplement (JUVEN)  1 packet Oral BID BM  . sodium bicarbonate  1,300 mg Oral TID  . sodium zirconium cyclosilicate  10 g Oral Daily   Continuous Infusions: . sodium chloride 75 mL/hr at 01/15/20 0823     LOS: 2 days     Enzo Bi, MD [facility] If 7PM-7AM, please contact night-coverage 01/15/2020, 6:22 PM

## 2020-01-14 NOTE — Anesthesia Procedure Notes (Signed)
Spinal  Patient location during procedure: OR Start time: 01/14/2020 2:15 PM End time: 01/14/2020 2:18 PM Staffing Performed: resident/CRNA  Resident/CRNA: Justus Memory, CRNA Preanesthetic Checklist Completed: patient identified, IV checked, site marked, risks and benefits discussed, surgical consent, monitors and equipment checked, pre-op evaluation and timeout performed Spinal Block Patient position: sitting Prep: Betadine Patient monitoring: heart rate, continuous pulse ox, blood pressure and cardiac monitor Approach: midline Location: L2-3 Injection technique: single-shot Needle Needle type: [place]acre and Introducer  Needle gauge: 24 G Needle length: 9 cm Additional Notes Negative paresthesia. Negative blood return. Positive free-flowing CSF. Expiration date of kit checked and confirmed. Patient tolerated procedure well, without complications.

## 2020-01-14 NOTE — Anesthesia Preprocedure Evaluation (Addendum)
Anesthesia Evaluation  Patient identified by MRN, date of birth, ID band Patient awake    Reviewed: Allergy & Precautions, H&P , NPO status , reviewed documented beta blocker date and time   Airway Mallampati: II  TM Distance: >3 FB Neck ROM: limited    Dental  (+) Edentulous Upper, Edentulous Lower   Pulmonary former smoker,     + decreased breath sounds      Cardiovascular hypertension, + CAD, + Past MI and +CHF  Normal cardiovascular exam  03/2019 ECHO IMPRESSIONS    1. The left ventricle has normal systolic function with an ejection  fraction of 60-65%. The cavity size was normal. Left ventricular diastolic  parameters were normal.  2. The right ventricle has normal systolic function. The cavity was  normal. There is no increase in right ventricular wall thickness.  3. The aorta is normal unless otherwise noted, Nml Ao valve.   Neuro/Psych  Neuromuscular disease    GI/Hepatic neg GERD  ,  Endo/Other  diabetes  Renal/GU Renal disease     Musculoskeletal   Abdominal   Peds  Hematology  (+) Blood dyscrasia, anemia ,   Anesthesia Other Findings Past Medical History: No date: (HFpEF) heart failure with preserved ejection fraction ([place])     Comment:  a. 03/2019 Echo: EF 60-65%, nl RV fxn. 09/2019: AAA (abdominal aortic aneurysm) (HCC)     Comment:  3.0 infrarenal AAA incidentally noted on               abdominal/pelvic CT No date: Bladder cancer ([place]) 04/2019: C. difficile colitis No date: CKD (chronic kidney disease), stage IV (HCC) No date: COPD (chronic obstructive pulmonary disease) ([place]) No date: Coronary artery disease     Comment:  a. 10/2017 NSTEMI/Cath: LM 22m, 75d, LAD 75ost, 90p, D1               100, LCX 44m/d, OM1 90, RCA 25p; b. 10/2017 CABG x 4               ([facility]): LIMA->LAD, VG->Diag, VG->OM1, VG->OM2. No date: Hyperlipidemia No date: Hypertension No date: NSVT (nonsustained ventricular  tachycardia) (HCC)     Comment:  a. 06/2019 [facility]io: 2 runs of NSVT up to 12 beats, max rate               187 bpm. No date: PSVT (paroxysmal supraventricular tachycardia) ([place])     Comment:  06/2019 [facility]io: 1820 episodes of SVT lasting up to 00:01:16              w/ max rate of 210. No date: Type 2 diabetes mellitus ([place])  Past Surgical History: No date: COLONOSCOPY 2019: CORONARY ARTERY BYPASS GRAFT     Comment:  [facility]; LIMA-LAD, SVG-diagonal, SVG-OM1, and SVG-OM2 04/23/2019: ESOPHAGOGASTRODUODENOSCOPY; N/A     Comment:  Procedure: ESOPHAGOGASTRODUODENOSCOPY (EGD);  Surgeon:               Jonathon Bellows, MD;  Location: [facility] ENDOSCOPY;  Service:               Gastroenterology;  Laterality: N/A; 04/21/2019: ESOPHAGOGASTRODUODENOSCOPY (EGD) WITH PROPOFOL; N/A     Comment:  Procedure: ESOPHAGOGASTRODUODENOSCOPY (EGD) WITH               PROPOFOL;  Surgeon: Jonathon Bellows, MD;  Location: [facility]               ENDOSCOPY;  Service: Gastroenterology;  Laterality: N/A; 10/31/2017: LEFT HEART CATH AND CORONARY ANGIOGRAPHY; N/A  Comment:  Procedure: LEFT HEART CATH AND CORONARY ANGIOGRAPHY;                Surgeon: Yolonda Kida, MD;  Location: [facility]              CV LAB;  Service: Cardiovascular;  Laterality: N/A; No date: REVISION UROSTOMY CUTANEOUS  BMI    Body Mass Index: 22.55 kg/m      Reproductive/Obstetrics                            Anesthesia Physical Anesthesia Plan  ASA: IV  Anesthesia Plan: Spinal   Post-op Pain Management:    Induction: Intravenous  PONV Risk Score and Plan: Treatment may vary due to age or medical condition, TIVA and Propofol infusion  Airway Management Planned: Nasal Cannula and Natural Airway  Additional Equipment:   Intra-op Plan:   Post-operative Plan:   Informed Consent: I have reviewed the patients History and Physical, chart, labs and discussed the procedure including the risks, benefits and alternatives for the  proposed anesthesia with the patient or authorized representative who has indicated his/her understanding and acceptance.     Dental Advisory Given  Plan Discussed with: CRNA  Anesthesia Plan Comments:        Anesthesia Quick Evaluation

## 2020-01-14 NOTE — Op Note (Signed)
01/14/2020  4:13 PM  Patient:   Bryan Brewer  Pre-Op Diagnosis:   Closed displaced intertrochanteric fracture, left hip.  Post-Op Diagnosis:   Same  Procedure:   Reduction and internal fixation of displaced intertrochanteric left hip fracture with Biomet Affixis TFN nail.  Surgeon:   Pascal Lux, MD  Assistant:   Kirkland Hun, PA-S  Anesthesia:   Spinal  Findings:   As above  Complications:   None  EBL:   75 cc  Fluids:   750 cc crystalloid  UOP:   100 cc  TT:   None  Drains:   None  Closure:   Staples  Implants:   Biomet Affixis 11 x 380 mm TFN with a 105 mm lag screw and a 42 mm distal interlocking screw  Brief Clinical Note:   The patient is a 79 year old male who sustained the above-noted injury last evening when he apparently slipped while getting out of his pickup truck and landed awkwardly on his left hip. He was brought to the emergency room where x-rays demonstrated the above-noted injury. The patient has been cleared medically and presents at this time for reduction and internal fixation of the displaced intertrochanteric left hip fracture.  Procedure:   The patient was brought into the operating room. After adequate spinal anesthesia was obtained, the patient was lain in the supine position on the fracture table. The uninjured leg was placed in a flexed and abducted position while the injured lower extremity was placed in longitudinal traction. The fracture was reduced using longitudinal traction and internal rotation. The adequacy of reduction was verified fluoroscopically in AP and lateral projections and found to be near anatomic. The lateral aspects of the left hip and thigh were prepped with ChloraPrep solution before being draped sterilely. Preoperative antibiotics were administered. A timeout was performed to verify the appropriate surgical site. The greater trochanter was identified fluoroscopically and an approximately 3 cm incision made about 2-3  fingerbreadths above the tip of the greater trochanter. The incision was carried down through the subcutaneous tissues to expose the gluteal fascia. This was split the length of the incision, providing access to the tip of the trochanter. Under fluoroscopic guidance, a guidewire was drilled through the tip of the trochanter into the proximal metaphysis to the level of the lesser trochanter. After verifying its position fluoroscopically in AP and lateral projections, it was overreamed with the initial reamer to the depth of the lesser trochanter. A guidewire was passed down through the femoral canal to the supracondylar region. The adequacy of guidewire position was verified fluoroscopically in AP and lateral projections before the length of the guidewire within the canal was measured and found to be 415 mm. Therefore, a 380 mm length nail was selected. The guidewire was overreamed sequentially using the flexible reamers, beginning with a 9 mm reamer and progressing to a 12.5 mm reamer. This provided good cortical chatter. The 11 x 380 mm Bi[employer] Affixis TFN rod was selected and advanced to the appropriate depth, as verified fluoroscopically.   The guide system for the lag screw was positioned and advanced through an approximately 2 cm stab incision over the lateral aspect of the proximal femur. The guidewire was drilled up through the trochanteric femoral nail and into the femoral neck to rest within 5 mm of subchondral bone. After verifying its position in the femoral neck and head in both AP and lateral projections, the guidewire was measured and found to be optimally replicated by a 161  mm lag screw. The guidewire was overreamed to the appropriate depth before the lag screw was inserted and advanced to the appropriate depth as verified fluoroscopically in AP and lateral projections. The locking screw was advanced, then backed off a quarter turn to set the lag screw. Again the adequacy of hardware position and  fracture reduction was verified fluoroscopically in AP and lateral projections and found to be excellent.  Attention was directed distally. Using the "perfect circle" technique, the leg and fluoroscopy machine were positioned appropriately. An approximately 1.5 cm stab incision was made over the skin at the appropriate point before the drill bit was advanced through the cortex and across the static hole of the nail. The appropriate length of the screw was determined before the 42 mm distal interlocking screw was positioned, then advanced and tightened securely. Again the adequacy of screw position was verified fluoroscopically in AP and lateral projections and found to be excellent.  The wounds were irrigated thoroughly with sterile saline solution before the abductor fascia was reapproximated using #1 Vicryl interrupted sutures. The subcutaneous tissues were closed using 2-0 Vicryl interrupted sutures. The skin was closed using staples. A total of 30 cc of 0.5% Sensorcaine with epinephrine was injected in and around all incisions. Sterile occlusive dressings were applied to all wounds before the patient was transferred back to his/her hospital bed. The patient was then transferred to the recovery room in satisfactory condition after tolerating the procedure well.

## 2020-01-14 NOTE — Transfer of Care (Signed)
Immediate Anesthesia Transfer of Care Note  Patient: Bryan Brewer  Procedure(s) Performed: INTRAMEDULLARY (IM) NAIL INTERTROCHANTRIC (Left )  Patient Location: PACU  Anesthesia Type:Spinal  Level of Consciousness: drowsy and patient cooperative  Airway & Oxygen Therapy: Patient Spontanous Breathing and Patient connected to face mask oxygen  Post-op Assessment: Report given to RN and Post -op Vital signs reviewed and stable  Post vital signs: Reviewed and stable  Last Vitals:  Vitals Value Taken Time  BP 115/61 01/14/20 1621  Temp 36.4 C 01/14/20 1612  Pulse 73 01/14/20 1623  Resp 15 01/14/20 1623  SpO2 100 % 01/14/20 1623  Vitals shown include unvalidated device data.  Last Pain:  Vitals:   01/14/20 1250  TempSrc: Tympanic  PainSc: 8          Complications: No complications documented.

## 2020-01-15 ENCOUNTER — Encounter: Payer: Self-pay | Admitting: Surgery

## 2020-01-15 LAB — BASIC METABOLIC PANEL
Anion gap: 9 (ref 5–15)
BUN: 60 mg/dL — ABNORMAL HIGH (ref 8–23)
CO2: 14 mmol/L — ABNORMAL LOW (ref 22–32)
Calcium: 8 mg/dL — ABNORMAL LOW (ref 8.9–10.3)
Chloride: 111 mmol/L (ref 98–111)
Creatinine, Ser: 3.58 mg/dL — ABNORMAL HIGH (ref 0.61–1.24)
GFR calc Af Amer: 18 mL/min — ABNORMAL LOW (ref >60)
GFR calc non Af Amer: 15 mL/min — ABNORMAL LOW (ref >60)
Glucose, Bld: 115 mg/dL — ABNORMAL HIGH (ref 70–99)
Potassium: 5.2 mmol/L — ABNORMAL HIGH (ref 3.5–5.1)
Sodium: 134 mmol/L — ABNORMAL LOW (ref 135–145)

## 2020-01-15 LAB — CBC
HCT: 25.5 % — ABNORMAL LOW (ref 39.0–52.0)
Hemoglobin: 8 g/dL — ABNORMAL LOW (ref 13.0–17.0)
MCH: 27.7 pg (ref 26.0–34.0)
MCHC: 31.4 g/dL (ref 30.0–36.0)
MCV: 88.2 fL (ref 80.0–100.0)
Platelets: 219 10*3/uL (ref 150–400)
RBC: 2.89 MIL/uL — ABNORMAL LOW (ref 4.22–5.81)
RDW: 14.3 % (ref 11.5–15.5)
WBC: 9 10*3/uL (ref 4.0–10.5)
nRBC: 0 % (ref 0.0–0.2)

## 2020-01-15 LAB — GLUCOSE, CAPILLARY
Glucose-Capillary: 88 mg/dL (ref 70–99)
Glucose-Capillary: 109 mg/dL — ABNORMAL HIGH (ref 70–99)
Glucose-Capillary: 67 mg/dL — ABNORMAL LOW (ref 70–99)
Glucose-Capillary: 88 mg/dL (ref 70–99)
Glucose-Capillary: 126 mg/dL — ABNORMAL HIGH (ref 70–99)
Glucose-Capillary: 145 mg/dL — ABNORMAL HIGH (ref 70–99)

## 2020-01-15 LAB — MAGNESIUM: Magnesium: 1.7 mg/dL (ref 1.7–2.4)

## 2020-01-15 LAB — MRSA PCR SCREENING: MRSA by PCR: POSITIVE — AB

## 2020-01-15 MED ORDER — SODIUM ZIRCONIUM CYCLOSILICATE 10 G PO PACK
10.0000 g | PACK | Freq: Every day | ORAL | Status: DC
  Administered 2020-01-15 – 2020-01-16 (×2): 10 g via ORAL
  Filled 2020-01-15 (×3): qty 1

## 2020-01-15 MED ORDER — INSULIN ASPART 100 UNIT/ML ~~LOC~~ SOLN
0.0000 [IU] | Freq: Three times a day (TID) | SUBCUTANEOUS | Status: DC
  Administered 2020-01-16 (×2): 2 [IU] via SUBCUTANEOUS
  Administered 2020-01-17: 3 [IU] via SUBCUTANEOUS
  Administered 2020-01-17: 2 [IU] via SUBCUTANEOUS
  Administered 2020-01-18: 3 [IU] via SUBCUTANEOUS
  Administered 2020-01-18 – 2020-01-19 (×2): 2 [IU] via SUBCUTANEOUS
  Filled 2020-01-15 (×7): qty 1

## 2020-01-15 MED ORDER — MUPIROCIN 2 % EX OINT
1.0000 "application " | TOPICAL_OINTMENT | Freq: Two times a day (BID) | CUTANEOUS | Status: DC
  Administered 2020-01-15 – 2020-01-18 (×8): 1 via NASAL
  Filled 2020-01-15: qty 22

## 2020-01-15 MED ORDER — CHLORHEXIDINE GLUCONATE CLOTH 2 % EX PADS
6.0000 | MEDICATED_PAD | Freq: Every day | CUTANEOUS | Status: AC
  Administered 2020-01-15 – 2020-01-19 (×5): 6 via TOPICAL

## 2020-01-15 MED ORDER — ENSURE ENLIVE PO LIQD
237.0000 mL | Freq: Two times a day (BID) | ORAL | Status: DC
  Administered 2020-01-15 – 2020-01-18 (×5): 237 mL via ORAL

## 2020-01-15 MED ORDER — JUVEN PO PACK
1.0000 | PACK | Freq: Two times a day (BID) | ORAL | Status: DC
  Administered 2020-01-15 – 2020-01-16 (×3): 1 via ORAL

## 2020-01-15 NOTE — Progress Notes (Signed)
PROGRESS NOTE    Bryan Brewer  POE:[id number] DOB: August 12, 1940 DOA: 01/13/2020 PCP: Tracie Harrier, MD    Assessment & Plan:   Principal Problem:   Closed left hip fracture, initial encounter ([place]) Active Problems:   Diabetes ([place])   CKD (chronic kidney disease) stage 4, GFR 15-29 ml/min (HCC)   HTN (hypertension)   COPD (chronic obstructive pulmonary disease) (HCC)   Chronic heart failure with preserved ejection fraction (HFpEF) (HCC)   Coronary artery disease involving native coronary artery of native heart without angina pectoris   BPH (benign prostatic hyperplasia)   Accidental fall   Preoperative clearance   Presence of urostomy ([place])    Bryan Brewer is a 79 y.o. male with medical history significant for CAD, HFpEF, CKD 4, COPD, bladder cancer status post cystoprostatectomy with urostomy and ileal conduit, CAD s/p CABG followed by cardiologist, Dr. Saunders Revel, last seen on 12/16/2019, who presents to the emergency room after suffering an accidental fall while getting out of his truck.  He fell onto his left hip also with impact to the left shoulder and elbow.   1. Closed left hip fracture, initial encounter s/p INTRAMEDULLARY (IM) NAIL INTERTROCHANTRIC on 01/14/20 -Patient presents with acute displaced left intertrochanteric hip fracture after an accidental fall trying to get out of his truck -Patient has cardiac history to include CAD, CABG and diastolic heart failure but was recently evaluated by his cardiologist on 12/16/2019 and found to be stable PLAN: --Pain management --PT rec home with HHPT    Diabetes mellitus type II ([place]) -Sliding scale insulin coverage    CKD (chronic kidney disease) stage 4, GFR 15-29 ml/min (HCC) -Renal function at baseline -Continue sodium bicarbonate.  History of bladder cancer status post cystoprostatectomy with urostomy and ileal conduit -Urostomy care per pt  Recurrent inguinal hernias    HTN (hypertension) --Hold home lasix and  metop due to low BP --d/c MIVF    COPD (chronic obstructive pulmonary disease) (HCC) -Not acutely exacerbated -DuoNebs as needed    Chronic heart failure with preserved ejection fraction (HFpEF) ([place]) -Patient is euvolemic --Hold home lasix and metop due to low BP    Coronary artery disease involving native coronary artery of native heart without angina pectoris -No complaints of chest pain and EKG with no acute ST-T wave changes --continue home aspirin, atorvastatin   Pressure Injury 01/14/20 Sacrum Stage 2, POA   DVT prophylaxis: Lovenox SQ Code Status: Full code  Family Communication: wife updated at bedside today Status is: inpatient Dispo:   The patient is from: home Anticipated d/c is to: home with HHPT Anticipated d/c date is: tomorrow Patient currently is not medically stable to d/c due to: need surgery clearance   Subjective and Interval History:  Pt reported pain while working with PT, but said he can tough it out.  No fever, dyspnea, chest pain, abdominal pain, N/V/D.  No more burning around his urostomy site.   Objective: Vitals:   01/15/20 0400 01/15/20 0725 01/15/20 1122 01/15/20 1530  BP: (!) 93/43 (!) 98/50 (!) 105/52 (!) 101/55  Pulse: 69 65 62 65  Resp: 18 17 16 17   Temp: 98.4 F (36.9 C) 98.6 F (37 C) 98 F (36.7 C) 97.7 F (36.5 C)  TempSrc: Oral Oral Oral Oral  SpO2: 96% 94% 96% 99%  Weight:      Height:        Intake/Output Summary (Last 24 hours) at 01/15/2020 1835 Last data filed at 01/15/2020 3154 Gross per 24  hour  Intake 1078.73 ml  Output 550 ml  Net 528.73 ml   Filed Weights   01/13/20 2134  Weight: 65.3 kg    Examination:   Constitutional: NAD, AAOx3 HEENT: conjunctivae and lids normal, EOMI CV: RRR no M,R,G. Distal pulses +2.  No cyanosis.   RESP: CTA B/L, normal respiratory effort  GI: +BS, NTND, urostomy stoma healthy-appearing, outputting clear urine Extremities: No effusions, edema, or tenderness in BLE   Neuro: II - XII grossly intact.  Sensation intact Psych: Normal mood and affect.  Appropriate judgement and reason   Data Reviewed: I have personally reviewed following labs and imaging studies  CBC: Recent Labs  Lab 01/13/20 2134 01/15/20 0633  WBC 10.5 9.0  NEUTROABS 9.5*  --   HGB 10.2* 8.0*  HCT 32.9* 25.5*  MCV 89.4 88.2  PLT 313 500   Basic Metabolic Panel: Recent Labs  Lab 01/13/20 2134 01/14/20 0823 01/15/20 0633  NA 133* 136 134*  K 5.6* 5.1 5.2*  CL 107 111 111  CO2 17* 16* 14*  GLUCOSE 184* 124* 115*  BUN 65* 64* 60*  CREATININE 3.89* 3.77* 3.58*  CALCIUM 8.7* 8.8* 8.0*  MG  --   --  1.7   GFR: Estimated Creatinine Clearance: 15.7 mL/min (A) (by C-G formula based on SCr of 3.58 mg/dL (H)). Liver Function Tests: Recent Labs  Lab 01/13/20 2134  AST 10*  ALT 10  ALKPHOS 80  BILITOT 0.7  PROT 7.8  ALBUMIN 3.0*   No results for input(s): LIPASE, AMYLASE in the last 168 hours. No results for input(s): AMMONIA in the last 168 hours. Coagulation Profile: Recent Labs  Lab 01/13/20 2134  INR 1.3*   Cardiac Enzymes: No results for input(s): CKTOTAL, CKMB, CKMBINDEX, TROPONINI in the last 168 hours. BNP (last 3 results) No results for input(s): PROBNP in the last 8760 hours. HbA1C: Recent Labs    01/13/20 2143  HGBA1C 6.4*   CBG: Recent Labs  Lab 01/15/20 0403 01/15/20 0722 01/15/20 1124 01/15/20 1234 01/15/20 1530  GLUCAP 88 109* 67* 88 126*   Lipid Profile: No results for input(s): CHOL, HDL, LDLCALC, TRIG, CHOLHDL, LDLDIRECT in the last 72 hours. Thyroid Function Tests: No results for input(s): TSH, T4TOTAL, FREET4, T3FREE, THYROIDAB in the last 72 hours. Anemia Panel: No results for input(s): VITAMINB12, FOLATE, FERRITIN, TIBC, IRON, RETICCTPCT in the last 72 hours. Sepsis Labs: No results for input(s): PROCALCITON, LATICACIDVEN in the last 168 hours.  Recent Results (from the past 240 hour(s))  SARS Coronavirus 2 by RT PCR  (hospital order, performed in [facility] hospital lab) Nasopharyngeal Nasopharyngeal Swab     Status: None   Collection Time: 01/13/20 10:33 PM   Specimen: Nasopharyngeal Swab  Result Value Ref Range Status   SARS Coronavirus 2 NEGATIVE NEGATIVE Final    Comment: (NOTE) SARS-CoV-2 target nucleic acids are NOT DETECTED. The SARS-CoV-2 RNA is generally detectable in upper and lower respiratory specimens during the acute phase of infection. The lowest concentration of SARS-CoV-2 viral copies this assay can detect is 250 copies / mL. A negative result does not preclude SARS-CoV-2 infection and should not be used as the sole basis for treatment or other patient management decisions.  A negative result may occur with improper specimen collection / handling, submission of specimen other than nasopharyngeal swab, presence of viral mutation(s) within the areas targeted by this assay, and inadequate number of viral copies (<250 copies / mL). A negative result must be combined with clinical observations,  patient history, and epidemiological information. Fact Sheet for Patients:   StrictlyIdeas.no Fact Sheet for Healthcare Providers: BankingDealers.co.za This test is not yet approved or cleared  by the [place] FDA and has been authorized for detection and/or diagnosis of SARS-CoV-2 by FDA under an Emergency Use Authorization (EUA).  This EUA will remain in effect (meaning this test can be used) for the duration of the COVID-19 declaration under Section 564(b)(1) of the Act, 21 U.S.C. section 360bbb-3(b)(1), unless the authorization is terminated or revoked sooner. Performed at [facility], [address] [place], [place] [address]   MRSA PCR Screening     Status: Abnormal   Collection Time: 01/15/20  9:44 AM   Specimen: Nasopharyngeal  Result Value Ref Range Status   MRSA by PCR POSITIVE (A) NEGATIVE Final    Comment:          The GeneXpert MRSA Assay (FDA approved for NASAL specimens only), is one component of a comprehensive MRSA colonization surveillance program. It is not intended to diagnose MRSA infection nor to guide or monitor treatment for MRSA infections. RESULT CALLED TO, READ BACK BY AND VERIFIED WITH: AMANDA FIELDS AT 1105 01/15/20.PMF Performed at [facility], [address], [place], [place] [address]       Radiology Studies: DG Elbow Complete Left  Result Date: 01/13/2020 CLINICAL DATA:  Status post EXAM: LEFT ELBOW - COMPLETE 3+ VIEW COMPARISON:  None. FINDINGS: There is no evidence of fracture, dislocation, or joint effusion. There is no evidence of arthropathy or other focal bone abnormality. Soft tissues are unremarkable. IMPRESSION: Negative. Electronically Signed   By: Constance Holster M.D.   On: 01/13/2020 22:17   CT Head Wo Contrast  Result Date: 01/13/2020 CLINICAL DATA:  Head trauma, headache, fall exiting tract EXAM: CT HEAD WITHOUT CONTRAST CT CERVICAL SPINE WITHOUT CONTRAST TECHNIQUE: Multidetector CT imaging of the head and cervical spine was performed following the standard protocol without intravenous contrast. Multiplanar CT image reconstructions of the cervical spine were also generated. COMPARISON:  PET-CT 10/15/2019 FINDINGS: CT HEAD FINDINGS Brain: No evidence of acute infarction, hemorrhage, hydrocephalus, extra-axial collection or mass lesion/mass effect. Symmetric prominence of the ventricles, cisterns and sulci compatible with parenchymal volume loss. Patchy areas of white matter hypoattenuation are most compatible with chronic microvascular angiopathy. Vascular: Atherosclerotic calcification of the carotid siphons and intradural vertebral arteries. No hyperdense vessel. Skull: No calvarial fracture or suspicious osseous lesion. No scalp swelling or hematoma. Sinuses/Orbits: Paranasal sinuses and mastoid air cells are predominantly clear. Orbital structures are  unremarkable aside from prior lens extractions. Other: Rightward nasal septal deviation with a contacting right-sided nasal septal spur. Minimal nonspecific cutaneous thickening of the right malar soft tissues, correlate with visual inspection. CT CERVICAL SPINE FINDINGS Alignment: Stabilization collar absent at the time of examination. There is mild rightward cranial rotation. No evidence of traumatic listhesis. No abnormally widened, perched or jumped facets. Normal alignment of the craniocervical and atlantoaxial articulations accounting for the degree of rotation. Skull base and vertebrae: No skull base or vertebral body fractures. No vertebral body height loss. No worrisome osseous lesions. Soft tissues and spinal canal: No pre or paravertebral fluid or swelling. No visible canal hematoma. Disc levels: Multilevel intervertebral disc height loss with spondylitic endplate changes. Large disc osteophyte complex at C6-7 results in mild canal stenosis. Smaller complex at C5-6 effaces the ventral thecal sac without significant canal stenosis. Multilevel uncinate spurring and facet hypertrophic changes are also maximal at these levels with at most mild  to moderate foraminal narrowing. Upper chest: Extensive centrilobular and paraseptal emphysematous changes in the lung apices. No consolidation or acute cardiopulmonary abnormality. Additional biapical pleuroparenchymal scarring. Other: Extensive vascular calcium throughout the proximal great vessels and cervical carotids. Normal thyroid. IMPRESSION: 1. No evidence of acute intracranial abnormality. No significant scalp swelling or calvarial fracture. 2. Chronic microvascular angiopathy and parenchymal volume loss. 3. No evidence of acute fracture or traumatic listhesis of the cervical spine. 4. Multilevel degenerative disc disease and facet hypertrophic changes of the cervical spine, most prominent at C6-7 where there is mild canal stenosis. 5. Extensive vascular  calcium throughout the proximal great vessels and cervical carotids. 6. Focal nonspecific skin thickening of the right malar soft tissues, correlate with visual inspection. 7. Emphysema (ICD10-J43.9). Electronically Signed   By: Lovena Le M.D.   On: 01/13/2020 22:43   CT Cervical Spine Wo Contrast  Result Date: 01/13/2020 CLINICAL DATA:  Head trauma, headache, fall exiting tract EXAM: CT HEAD WITHOUT CONTRAST CT CERVICAL SPINE WITHOUT CONTRAST TECHNIQUE: Multidetector CT imaging of the head and cervical spine was performed following the standard protocol without intravenous contrast. Multiplanar CT image reconstructions of the cervical spine were also generated. COMPARISON:  PET-CT 10/15/2019 FINDINGS: CT HEAD FINDINGS Brain: No evidence of acute infarction, hemorrhage, hydrocephalus, extra-axial collection or mass lesion/mass effect. Symmetric prominence of the ventricles, cisterns and sulci compatible with parenchymal volume loss. Patchy areas of white matter hypoattenuation are most compatible with chronic microvascular angiopathy. Vascular: Atherosclerotic calcification of the carotid siphons and intradural vertebral arteries. No hyperdense vessel. Skull: No calvarial fracture or suspicious osseous lesion. No scalp swelling or hematoma. Sinuses/Orbits: Paranasal sinuses and mastoid air cells are predominantly clear. Orbital structures are unremarkable aside from prior lens extractions. Other: Rightward nasal septal deviation with a contacting right-sided nasal septal spur. Minimal nonspecific cutaneous thickening of the right malar soft tissues, correlate with visual inspection. CT CERVICAL SPINE FINDINGS Alignment: Stabilization collar absent at the time of examination. There is mild rightward cranial rotation. No evidence of traumatic listhesis. No abnormally widened, perched or jumped facets. Normal alignment of the craniocervical and atlantoaxial articulations accounting for the degree of rotation.  Skull base and vertebrae: No skull base or vertebral body fractures. No vertebral body height loss. No worrisome osseous lesions. Soft tissues and spinal canal: No pre or paravertebral fluid or swelling. No visible canal hematoma. Disc levels: Multilevel intervertebral disc height loss with spondylitic endplate changes. Large disc osteophyte complex at C6-7 results in mild canal stenosis. Smaller complex at C5-6 effaces the ventral thecal sac without significant canal stenosis. Multilevel uncinate spurring and facet hypertrophic changes are also maximal at these levels with at most mild to moderate foraminal narrowing. Upper chest: Extensive centrilobular and paraseptal emphysematous changes in the lung apices. No consolidation or acute cardiopulmonary abnormality. Additional biapical pleuroparenchymal scarring. Other: Extensive vascular calcium throughout the proximal great vessels and cervical carotids. Normal thyroid. IMPRESSION: 1. No evidence of acute intracranial abnormality. No significant scalp swelling or calvarial fracture. 2. Chronic microvascular angiopathy and parenchymal volume loss. 3. No evidence of acute fracture or traumatic listhesis of the cervical spine. 4. Multilevel degenerative disc disease and facet hypertrophic changes of the cervical spine, most prominent at C6-7 where there is mild canal stenosis. 5. Extensive vascular calcium throughout the proximal great vessels and cervical carotids. 6. Focal nonspecific skin thickening of the right malar soft tissues, correlate with visual inspection. 7. Emphysema (ICD10-J43.9). Electronically Signed   By: Lovena Le M.D.   On: 01/13/2020  22:43   DG Chest Portable 1 View  Result Date: 01/13/2020 CLINICAL DATA:  Pain EXAM: PORTABLE CHEST 1 VIEW COMPARISON:  04/29/2019 FINDINGS: The heart size is stable but enlarged. The patient is status post prior median sternotomy. There are aortic calcifications. There is mild vascular congestion without overt  pulmonary edema. There is no pneumothorax. No significant pleural effusion. IMPRESSION: Cardiomegaly with mild vascular congestion. No overt pulmonary edema or significant pleural effusion. Electronically Signed   By: Constance Holster M.D.   On: 01/13/2020 22:17   DG Shoulder Left  Result Date: 01/13/2020 CLINICAL DATA:  Pain status post fall EXAM: LEFT SHOULDER - 2+ VIEW COMPARISON:  None. FINDINGS: There is no evidence of fracture or dislocation. There is no evidence of arthropathy or other focal bone abnormality. Soft tissues are unremarkable. IMPRESSION: Negative. Electronically Signed   By: Constance Holster M.D.   On: 01/13/2020 22:18   DG HIP OPERATIVE UNILAT WITH PELVIS LEFT  Result Date: 01/14/2020 CLINICAL DATA:  Left hip ORIF EXAM: OPERATIVE LEFT HIP (WITH PELVIS IF PERFORMED) 2 VIEWS TECHNIQUE: Fluoroscopic spot image(s) were submitted for interpretation post-operatively. COMPARISON:  01/13/2020 FINDINGS: 4 C-arm fluoroscopic images were obtained intraoperatively and submitted for post operative interpretation. Interval placement of anterograde long intramedullary nail with proximal lag screw and distal interlocking screw traversing intertrochanteric fracture the proximal left femur. Improved osseous alignment. Please see the performing provider's procedural report for further detail. IMPRESSION: As above. Electronically Signed   By: Davina Poke D.O.   On: 01/14/2020 18:02   DG Hip Unilat W or Wo Pelvis 2-3 Views Left  Result Date: 01/13/2020 CLINICAL DATA:  Pain EXAM: DG HIP (WITH OR WITHOUT PELVIS) 2-3V LEFT COMPARISON:  None. FINDINGS: There is an acute displaced, comminuted intratrochanteric fracture of the proximal left femur. There is no dislocation. There are moderate degenerative changes of the left hip. There are mild degenerative changes of the right hip. IMPRESSION: Acute displaced, comminuted intratrochanteric fracture of the proximal left femur. Electronically Signed   By:  Constance Holster M.D.   On: 01/13/2020 22:16     Scheduled Meds: . aspirin EC  81 mg Oral QPM  . atorvastatin  80 mg Oral QHS  . Chlorhexidine Gluconate Cloth  6 each Topical Q0600  . docusate sodium  100 mg Oral BID  . enoxaparin (LOVENOX) injection  30 mg Subcutaneous Q24H  . feeding supplement (ENSURE ENLIVE)  237 mL Oral BID BM  . glipiZIDE  2.5 mg Oral QAC breakfast  . [START ON 01/16/2020] insulin aspart  0-15 Units Subcutaneous TID WC  . mupirocin ointment  1 application Nasal BID  . nutrition supplement (JUVEN)  1 packet Oral BID BM  . sodium bicarbonate  1,300 mg Oral TID  . sodium zirconium cyclosilicate  10 g Oral Daily   Continuous Infusions:    LOS: 2 days     Enzo Bi, MD [facility] If 7PM-7AM, please contact night-coverage 01/15/2020, 6:35 PM

## 2020-01-15 NOTE — Progress Notes (Signed)
Initial Nutrition Assessment  DOCUMENTATION CODES:   Not applicable  INTERVENTION:  -Ensure Enlive po BID, each supplement provides 350 kcal and 20 grams of protein -1 packet Juven BID, each packet provides 95 calories, 2.5 grams of protein (collagen), and 9.8 grams of carbohydrate (3 grams sugar); also contains 7 grams of L-arginine and L-glutamine, 300 mg vitamin C, 15 mg vitamin E, 1.2 mcg vitamin B-12, 9.5 mg zinc, 200 mg calcium, and 1.5 g  Calcium Beta-hydroxy-Beta-methylbutyrate to support wound healing -Will provide "High Potassium Foods" handout    NUTRITION DIAGNOSIS:   Increased nutrient needs related to post-op healing as evidenced by estimated needs.    GOAL:   Patient will meet greater than or equal to 90% of their needs    MONITOR:   PO intake, Supplement acceptance, Labs, Weight trends, I & O's, Skin  REASON FOR ASSESSMENT:   Consult Assessment of nutrition requirement/status  ASSESSMENT:  79 year old male admitted for surgical intervention of acute displaced left intertrochanteric fracture with past medical history significant of CAD, HFpEF, CKD stage IV, COPD, bladder cancer s/p cystoprostatectomy with urostomy and ileal conduit, CAD s/p CABG. Patient presented after accidental fall while getting out of his truck landing onto his left hip and shoulder.  Patient is s/p reduction and internal fixation of displaced intertrochanteric left hip fracture with [employer] TFN nail on 6/10.   Patient awake, alert, reclined in bedside chair. Patient reports poor appetite due nausea from anaesthesia medication. Patient reports that he has a good appetite at home, recalls 3 meals/day prepared by his wife. He usually has eggs, toast, bacon or sausage for breakfast, sandwiches for lunch, states that he enjoys peanut butter and banana sandwiches, recalls turnip greens, pinto beans, cornbread, and meat with vegetables for dinner. RD discussed the importance of adequate  nutrition to support post-op healing, patient amenable to drinking chocolate Ensure and Juven. RD discussed hyperkalemia, patient reports he has been taking medication at home. RD educated on foods high in potassium, will provide handout to patient to reference at home.      Current wt 143.66 lb Weight history reviewed, stable over the 8 months  Medications reviewed and include: SSI, Glipizide, sodium bicarbonate, Lokelma IVF: NaCl Labs: CBGs 109,88,95,201, Na 134 (L), K 5.2 (H) Lab Results  Component Value Date   HGBA1C 6.4 (H) 01/13/2020     NUTRITION - FOCUSED PHYSICAL EXAM: Mild fat depletion to orbital, upper arm, and buccal regions, mild muscle depletion to clavicle and dorsal hand.    Diet Order:   Diet Order            Diet heart healthy/carb modified Room service appropriate? Yes; Fluid consistency: Thin  Diet effective now                 EDUCATION NEEDS:   Education needs have been addressed  Skin:     Last BM:  6/07  Height:   Ht Readings from Last 1 Encounters:  01/13/20 5\' 7"  (1.702 m)    Weight:   Wt Readings from Last 1 Encounters:  01/13/20 65.3 kg    BMI:  Body mass index is 22.55 kg/m.  Estimated Nutritional Needs:   Kcal:  1800-2000  Protein:  90-100  Fluid:  >/= 1.7 L/day   Lajuan Lines, RD, LDN Clinical Nutrition After Hours/Weekend Pager # in [place]

## 2020-01-15 NOTE — Anesthesia Postprocedure Evaluation (Signed)
Anesthesia Post Note  Patient: Bryan Brewer  Procedure(s) Performed: INTRAMEDULLARY (IM) NAIL INTERTROCHANTRIC (Left )  Patient location during evaluation: Nursing Unit Anesthesia Type: Spinal Level of consciousness: awake, awake and alert and oriented Pain management: pain level controlled Vital Signs Assessment: post-procedure vital signs reviewed and stable Respiratory status: spontaneous breathing, nonlabored ventilation and respiratory function stable Cardiovascular status: stable and blood pressure returned to baseline Postop Assessment: no headache and no backache Anesthetic complications: no   No complications documented.   Last Vitals:  Vitals:   01/15/20 0400 01/15/20 0725  BP: (!) 93/43 (!) 98/50  Pulse: 69 65  Resp: 18 17  Temp: 36.9 C 37 C  SpO2: 96% 94%    Last Pain:  Vitals:   01/15/20 0725  TempSrc: Oral  PainSc:                  Bryan Brewer

## 2020-01-15 NOTE — TOC Initial Note (Signed)
Transition of Care [facility]) - Initial/Assessment Note    Patient Details  Name: Bryan Brewer MRN: [id number] Date of Birth: [birth year]  Transition of Care [facility]) CM/SW Contact:    Shelbie Ammons, RN Phone Number: 01/15/2020, 1:55 PM  Clinical Narrative:    RNCM assessed patient at bedside. Patient is sitting up in the recliner and reports to feeling better today. Discussed CM role and that we would discuss discharge recommendations and patient was agreeable. Discussed with patient that PT has recommended that he go home with home health services and with a rolling walker and 3N1. Patient initially reluctant to accept services in home but then eventually reported that he would "give it a try".  Patient does not have preference as to what home health agency or DME company.  He still drives, does not have any equipment in the home, his PCP is Dr. Ginette Pitman and he gets his medications from [facility], he denies any problems obtaining medications.  RNCM contacted Zack with [employer] for equipment and Brittney with [facility] for home health.                Expected Discharge Plan: [place] Barriers to Discharge: No Barriers Identified   Patient Goals and CMS Choice     Choice offered to / list presented to : Patient  Expected Discharge Plan and Services Expected Discharge Plan: [place] Acute Care Choice: [place] arrangements for the past 2 months: Single Family Home                                      Prior Living Arrangements/Services Living arrangements for the past 2 months: Single Family Home                     Activities of Daily Living Home Assistive Devices/Equipment: Environmental consultant (specify type) ADL Screening (condition at time of admission) Patient's cognitive ability adequate to safely complete daily activities?: Yes Is the patient deaf or have difficulty hearing?: No Does the patient have difficulty  seeing, even when wearing glasses/contacts?: No Does the patient have difficulty concentrating, remembering, or making decisions?: No Patient able to express need for assistance with ADLs?: Yes Does the patient have difficulty dressing or bathing?: Yes Independently performs ADLs?: Yes (appropriate for developmental age) Does the patient have difficulty walking or climbing stairs?: Yes Weakness of Legs: Both Weakness of Arms/Hands: None  Permission Sought/Granted                  Emotional Assessment              Admission diagnosis:  Closed left hip fracture, initial encounter [facility]) [S72.002A] Patient Active Problem List   Diagnosis Date Noted  . Presence of urostomy ([place]) 01/14/2020  . Closed left hip fracture, initial encounter ([place]) 01/13/2020  . Accidental fall 01/13/2020  . Preoperative clearance 01/13/2020  . Hyperkalemia 12/17/2019  . Abdominal pain 09/18/2019  . Iron deficiency anemia 07/14/2019  . PSVT (paroxysmal supraventricular tachycardia) ([place]) 06/27/2019  . Aortic atherosclerosis ([place]) 06/16/2019  . Paroxysmal atrial fibrillation ([place]) 06/15/2019  . Bladder cancer ([place]) 06/02/2019  . BPH (benign prostatic hyperplasia) 06/02/2019  . Anemia 05/21/2019  . Chronic heart failure with preserved ejection fraction (HFpEF) ([place]) 05/14/2019  . Coronary artery disease involving native coronary artery of native heart without angina  pectoris 05/14/2019  . Cellulitis of lower extremity 05/14/2019  . Skin ulcer of calf, limited to breakdown of skin ([place]) 05/14/2019  . Hypoglycemia 04/29/2019  . Hypotension due to hypovolemia 04/29/2019  . Tachycardia   . GIB (gastrointestinal bleeding) 04/20/2019  . Pressure injury of skin 03/31/2019  . HCAP (healthcare-associated pneumonia) 03/29/2019  . UTI (urinary tract infection) 03/29/2019  . Severe sepsis with septic shock ([place]) 03/29/2019  . Hyponatremia 03/29/2019  . Acute on chronic renal failure ([place]) 02/14/2019  .  Osteoarthritis of knee 01/06/2019  . Chronic systolic CHF (congestive heart failure) ([place]) 11/20/2018  . Primary osteoarthritis involving multiple joints 08/02/2018  . Presence of aortocoronary bypass graft 11/08/2017  . NSTEMI (non-ST elevated myocardial infarction) ([place]) 10/30/2017  . Diabetes ([place]) 10/30/2017  . CKD (chronic kidney disease) stage 4, GFR 15-29 ml/min (HCC) 10/30/2017  . HTN (hypertension) 10/30/2017  . HLD (hyperlipidemia) 10/30/2017  . COPD (chronic obstructive pulmonary disease) ([place]) 10/30/2017  . Spinal stenosis of lumbar region 08/20/2017  . Lumbar radiculopathy 08/09/2017  . Hx of bladder cancer 04/30/2016  . Pure hypercholesterolemia 09/08/2015  . Simple chronic bronchitis ([place]) 09/08/2015  . Hypomagnesemia 03/30/2015  . Benign essential hypertension 03/30/2015  . Abdominal wall hernia at previous stoma site 01/12/2013  . Diarrhea in adult patient 01/12/2013   PCP:  Tracie Harrier, MD Pharmacy:   [place], [place] - [address] [place] [address] Phone: [phone] Fax: [phone]     Social Determinants of Health (SDOH) Interventions    Readmission Risk Interventions Readmission Risk Prevention Plan 01/15/2020  Transportation Screening Complete  Medication Review ([place]) Complete  PCP or Specialist appointment within 3-5 days of discharge Complete  HRI or [place] Complete  [place] Not Applicable  Some recent data might be hidden

## 2020-01-15 NOTE — Progress Notes (Signed)
Inpatient Diabetes Program Recommendations  AACE/ADA: New Consensus Statement on Inpatient Glycemic Control (2015)  Target Ranges:  Prepandial:   less than 140 mg/dL      Peak postprandial:   less than 180 mg/dL (1-2 hours)      Critically ill patients:  140 - 180 mg/dL   Results for Bryan Brewer, Bryan Brewer (MRN [id number]) as of 01/15/2020 13:53  Ref. Range 01/14/2020 23:25 01/15/2020 04:03 01/15/2020 07:22 01/15/2020 11:24  Glucose-Capillary Latest Ref Range: 70 - 99 mg/dL 95 88 109 (H) 67 (L)     Home DM Meds: Glipizide 2.5 mg Daily  Current Orders: Glipizide 2.5 mg Daily      Novolog Moderate Correction Scale/ SSI (0-15 units) Q4 hours     MD- Note patient received 2.5 mg Glipizide this AM (home dose).  Mild Hypoglycemia at 11:30am today (CBG 67).  May consider stopping Glipizide in-hospital for now--Can resume at time of discharge    --Will follow patient during hospitalization--  Wyn Quaker RN, MSN, CDE Diabetes Coordinator Inpatient Glycemic Control Team Team Pager: [phone] (8a-5p)

## 2020-01-15 NOTE — Care Management Important Message (Signed)
Important Message  Patient Details  Name: Bryan Brewer MRN: [id number] Date of Birth: 02/18/1941   Medicare Important Message Given:  N/A - LOS <3 / Initial given by admissions     [name] 01/15/2020, 7:42 AM

## 2020-01-15 NOTE — Evaluation (Signed)
Physical Therapy Evaluation Patient Details Name: Bryan Brewer MRN: [id number] DOB: [birth year] Today's Date: 01/15/2020   History of Present Illness  79 y/o male s/p fall getting out of truck and fractured L hip, now s/p ORIF 6/11.  Clinical Impression  Pt highly motivated to go home and did relatively well with initial PT effort.  He  had some fatigue with ambulation as well as some hesitancy with with full WBing on L he was able to get himself to EOB w/o assist, rise to standing w/o physical assist and walk ~40 ft with slow but safe cadence and heavy walker reliance.      Follow Up Recommendations Home health PT    Equipment Recommendations  Rolling walker with 5" wheels;3in1 (PT)    Recommendations for Other Services       Precautions / Restrictions Precautions Precautions: Fall Restrictions Weight Bearing Restrictions: Yes LLE Weight Bearing: Weight bearing as tolerated      Mobility  Bed Mobility Overal bed mobility: Needs Assistance Bed Mobility: Supine to Sit           General bed mobility comments: Pt slow to get L LE to EOB, but with good effort did not need any assist to get to sitting EOB  Transfers Overall transfer level: Needs assistance Equipment used: Rolling walker (2 wheeled) Transfers: Sit to/from Stand Sit to Stand: Min guard         General transfer comment: elevated bed to standard chair height, cues for UE use and sequencing but able to rise w/o direct assist  Ambulation/Gait Ambulation/Gait assistance: Min guard Gait Distance (Feet): 40 Feet Assistive device: Rolling walker (2 wheeled)       General Gait Details: Pt showed good overall effort but was slow, hesitant to fully use L LE and reliant on the walker.  He did start to have some fatigue (sats on room air dropped to mid 80s), but no LOBs or overt safety concnerns.   Stairs            Wheelchair Mobility    Modified Rankin (Stroke Patients Only)       Balance  Overall balance assessment: Needs assistance   Sitting balance-Leahy Scale: Good       Standing balance-Leahy Scale: Fair Standing balance comment: reliant on walker, hesitant to put full weight on L                             Pertinent Vitals/Pain Pain Assessment: 0-10 Pain Score: 2     Home Living Family/patient expects to be discharged to:: Private residence Living Arrangements: Spouse/significant other Available Help at Discharge: Family;Available 24 hours/day Type of Home: House Home Access: Stairs to enter Entrance Stairs-Rails: None Entrance Stairs-Number of Steps: 2 Home Layout: Able to live on main level with bedroom/bathroom Home Equipment: Walker - standard;Cane - single point      Prior Function Level of Independence: Independent         Comments: Indep without assist device for ADLs, household mobilization at baseline due to chronic lumbar neuropathy in LLE and need for L total knee; recent use of SPC due to weakness in recent weeks. Endorses single fall within previous six months.     Hand Dominance        Extremity/Trunk Assessment   Upper Extremity Assessment Upper Extremity Assessment: Overall WFL for tasks assessed    Lower Extremity Assessment Lower Extremity Assessment:  (expected L LE post-op weakness)  Communication   Communication: HOH  Cognition Arousal/Alertness: Awake/alert Behavior During Therapy: WFL for tasks assessed/performed Overall Cognitive Status: Within Functional Limits for tasks assessed                                        General Comments      Exercises General Exercises - Lower Extremity Ankle Circles/Pumps: Strengthening;10 reps Quad Sets: Strengthening;10 reps Short Arc Quad: AROM;10 reps Heel Slides: AROM;10 reps (with lightly resisted leg extensions) Hip ABduction/ADduction: AAROM;AROM;10 reps Straight Leg Raises:  (attempted AAROM with increased pain, unable)    Assessment/Plan    PT Assessment Patient needs continued PT services  PT Problem List Decreased strength;Decreased range of motion;Decreased activity tolerance;Decreased balance;Decreased mobility;Decreased coordination;Decreased cognition;Decreased knowledge of use of DME;Decreased safety awareness;Pain       PT Treatment Interventions DME instruction;Gait training;Stair training;Functional mobility training;Therapeutic activities;Therapeutic exercise;Balance training;Neuromuscular re-education;Patient/family education    PT Goals (Current goals can be found in the Care Plan section)  Acute Rehab PT Goals Patient Stated Goal: go home PT Goal Formulation: With patient Time For Goal Achievement: 01/29/20 Potential to Achieve Goals: Fair    Frequency BID   Barriers to discharge        Co-evaluation               AM-PAC PT "6 Clicks" Mobility  Outcome Measure Help needed turning from your back to your side while in a flat bed without using bedrails?: A Little Help needed moving from lying on your back to sitting on the side of a flat bed without using bedrails?: A Little Help needed moving to and from a bed to a chair (including a wheelchair)?: A Little Help needed standing up from a chair using your arms (e.g., wheelchair or bedside chair)?: A Little Help needed to walk in hospital room?: A Little Help needed climbing 3-5 steps with a railing? : A Lot 6 Click Score: 17    End of Session Equipment Utilized During Treatment: Gait belt Activity Tolerance: Patient tolerated treatment well Patient left: with call bell/phone within reach;in chair;with nursing/sitter in room Nurse Communication: Mobility status PT Visit Diagnosis: Difficulty in walking, not elsewhere classified (R26.2);Muscle weakness (generalized) (M62.81);Pain;Unsteadiness on feet (R26.81) Pain - Right/Left: Left Pain - part of body: Hip    Time: 9767-3419 PT Time Calculation (min) (ACUTE ONLY): 31  min   Charges:   PT Evaluation $PT Eval Low Complexity: 1 Low PT Treatments $Therapeutic Exercise: 8-22 mins        Kreg Shropshire, DPT 01/15/2020, 12:25 PM

## 2020-01-15 NOTE — Progress Notes (Signed)
Physical Therapy Treatment Patient Details Name: Bryan Brewer MRN: [id number] DOB: April 17, 1941 Today's Date: 01/15/2020    History of Present Illness 79 y/o male s/p fall getting out of truck and fractured L hip, now s/p ORIF 6/11.    PT Comments    Pt c/o more pain this afternoon, but remains highly motivated to do what he can and work with PT.  He was more efficient with bed mobility, was able to increased gait distance and speed/cadence and showed good effort with all supine exercises.  Pt making slow and steady gains POD1.   Follow Up Recommendations  Home health PT     Equipment Recommendations  Rolling walker with 5" wheels;3in1 (PT)    Recommendations for Other Services       Precautions / Restrictions Precautions Precautions: Fall Restrictions LLE Weight Bearing: Weight bearing as tolerated    Mobility  Bed Mobility Overal bed mobility: Needs Assistance Bed Mobility: Supine to Sit;Sit to Supine     Supine to sit: Min guard Sit to supine: Min guard   General bed mobility comments: Pt again slow with L hip/leg movement  Transfers Overall transfer level: Needs assistance Equipment used: Rolling walker (2 wheeled) Transfers: Sit to/from Stand Sit to Stand: Min guard         General transfer comment: reminders again for UE use/placement - able to rise w/o direct assist  Ambulation/Gait Ambulation/Gait assistance: Min guard Gait Distance (Feet): 55 Feet Assistive device: Rolling walker (2 wheeled)       General Gait Details: Pt able to ambulate further and with more confidence this afternoon. He still had hesitancy with L WBing and leaned heavily on the walker.  Difficulty to get accurate pulse ox reading, but similarly to this AM sats seemed to drop into mid 80s with the effort, slowly back up to 90s in sitting post ambulation   Stairs             Wheelchair Mobility    Modified Rankin (Stroke Patients Only)       Balance Overall balance  assessment: Needs assistance   Sitting balance-Leahy Scale: Good       Standing balance-Leahy Scale: Fair                              Cognition Arousal/Alertness: Awake/alert Behavior During Therapy: WFL for tasks assessed/performed Overall Cognitive Status: Within Functional Limits for tasks assessed                                        Exercises General Exercises - Lower Extremity Ankle Circles/Pumps: Strengthening;10 reps Quad Sets: Strengthening;10 reps Short Arc Quad: Strengthening;15 reps Heel Slides: AROM;10 reps (with resisted leg extension) Hip ABduction/ADduction: AROM;Strengthening;15 reps    General Comments        Pertinent Vitals/Pain Pain Score: 5     Home Living                      Prior Function            PT Goals (current goals can now be found in the care plan section) Progress towards PT goals: Progressing toward goals    Frequency    BID      PT Plan Current plan remains appropriate    Co-evaluation  AM-PAC PT "6 Clicks" Mobility   Outcome Measure  Help needed turning from your back to your side while in a flat bed without using bedrails?: A Little Help needed moving from lying on your back to sitting on the side of a flat bed without using bedrails?: A Little Help needed moving to and from a bed to a chair (including a wheelchair)?: A Little Help needed standing up from a chair using your arms (e.g., wheelchair or bedside chair)?: A Little Help needed to walk in hospital room?: A Little Help needed climbing 3-5 steps with a railing? : A Lot 6 Click Score: 17    End of Session Equipment Utilized During Treatment: Gait belt Activity Tolerance: Patient tolerated treatment well Patient left: with call bell/phone within reach;in chair;with nursing/sitter in room Nurse Communication: Mobility status PT Visit Diagnosis: Difficulty in walking, not elsewhere classified  (R26.2);Muscle weakness (generalized) (M62.81);Pain;Unsteadiness on feet (R26.81) Pain - Right/Left: Left Pain - part of body: Hip     Time: 2878-6767 PT Time Calculation (min) (ACUTE ONLY): 27 min  Charges:  $Gait Training: 8-22 mins $Therapeutic Exercise: 8-22 mins                     Kreg Shropshire, DPT 01/15/2020, 5:49 PM

## 2020-01-16 LAB — BASIC METABOLIC PANEL
Anion gap: 9 (ref 5–15)
BUN: 72 mg/dL — ABNORMAL HIGH (ref 8–23)
CO2: 17 mmol/L — ABNORMAL LOW (ref 22–32)
Calcium: 8.4 mg/dL — ABNORMAL LOW (ref 8.9–10.3)
Chloride: 107 mmol/L (ref 98–111)
Creatinine, Ser: 3.4 mg/dL — ABNORMAL HIGH (ref 0.61–1.24)
GFR calc Af Amer: 19 mL/min — ABNORMAL LOW (ref >60)
GFR calc non Af Amer: 16 mL/min — ABNORMAL LOW (ref >60)
Glucose, Bld: 101 mg/dL — ABNORMAL HIGH (ref 70–99)
Potassium: 5.6 mmol/L — ABNORMAL HIGH (ref 3.5–5.1)
Sodium: 133 mmol/L — ABNORMAL LOW (ref 135–145)

## 2020-01-16 LAB — GLUCOSE, CAPILLARY
Glucose-Capillary: 119 mg/dL — ABNORMAL HIGH (ref 70–99)
Glucose-Capillary: 140 mg/dL — ABNORMAL HIGH (ref 70–99)
Glucose-Capillary: 130 mg/dL — ABNORMAL HIGH (ref 70–99)
Glucose-Capillary: 167 mg/dL — ABNORMAL HIGH (ref 70–99)

## 2020-01-16 LAB — POTASSIUM: Potassium: 4.9 mmol/L (ref 3.5–5.1)

## 2020-01-16 LAB — MAGNESIUM: Magnesium: 1.8 mg/dL (ref 1.7–2.4)

## 2020-01-16 MED ORDER — TRAMADOL HCL 50 MG PO TABS: 50.0000 mg | ORAL_TABLET | Freq: Four times a day (QID) | ORAL | 0 refills | Status: DC | PRN

## 2020-01-16 MED ORDER — FUROSEMIDE 20 MG PO TABS: ORAL_TABLET | ORAL | 2 refills | Status: DC

## 2020-01-16 MED ORDER — HYDROCODONE-ACETAMINOPHEN 5-325 MG PO TABS: 1.0000 | ORAL_TABLET | Freq: Four times a day (QID) | ORAL | 0 refills | Status: DC | PRN

## 2020-01-16 MED ORDER — ENOXAPARIN SODIUM 40 MG/0.4ML ~~LOC~~ SOLN: 40.0000 mg | SUBCUTANEOUS | 0 refills | Status: DC

## 2020-01-16 MED ORDER — SODIUM POLYSTYRENE SULFONATE 15 GM/60ML PO SUSP
30.0000 g | Freq: Once | ORAL | Status: AC
  Administered 2020-01-16: 30 g via ORAL
  Filled 2020-01-16: qty 120

## 2020-01-16 MED ORDER — ACETAMINOPHEN 500 MG PO TABS: 1000.0000 mg | ORAL_TABLET | Freq: Three times a day (TID) | ORAL | Status: DC | PRN

## 2020-01-16 NOTE — Progress Notes (Signed)
Physical Therapy Treatment Patient Details Name: Bryan Brewer MRN: [id number] DOB: 1940/11/06 Today's Date: 01/16/2020    History of Present Illness 79 y/o male s/p fall getting out of truck and fractured L hip, now s/p ORIF 6/11.    PT Comments    Pt was seen for mobility from chair and controlled standing and balancing look better.  Pt had to release a hand to empty the urostomy and was able to with PT providing contact assist only.  Better tolerance for L hip surgery, no meds overnight and is a bit stiff today, likely due to no meds since yesterday.  Follow up with a stair practice, and will be moving closer to getting home.   Follow Up Recommendations  Home health PT     Equipment Recommendations  Rolling walker with 5" wheels;3in1 (PT)    Recommendations for Other Services       Precautions / Restrictions Precautions Precautions: Fall Restrictions Weight Bearing Restrictions: Yes LLE Weight Bearing: Weight bearing as tolerated    Mobility  Bed Mobility Overal bed mobility: Needs Assistance             General bed mobility comments: up in chair when PT arrived  Transfers Overall transfer level: Needs assistance Equipment used: Rolling walker (2 wheeled) Transfers: Sit to/from Stand Sit to Stand: Min assist         General transfer comment: pt is stiff with effort to stand  Ambulation/Gait Ambulation/Gait assistance: Min guard Gait Distance (Feet): 60 Feet Assistive device: Rolling walker (2 wheeled) Gait Pattern/deviations: Step-through pattern;Shuffle;Decreased weight shift to left;Wide base of support Gait velocity: controlled, slower   General Gait Details: Pt was seen for hallway walk, had a better tolerance today with no meds overnight    Stairs             Wheelchair Mobility    Modified Rankin (Stroke Patients Only)       Balance Overall balance assessment: Needs assistance Sitting-balance support: Feet supported Sitting  balance-Leahy Scale: Good     Standing balance support: Bilateral upper extremity supported;During functional activity Standing balance-Leahy Scale: Fair Standing balance comment: able to release a hand to empty urostomy                            Cognition Arousal/Alertness: Awake/alert Behavior During Therapy: WFL for tasks assessed/performed Overall Cognitive Status: Within Functional Limits for tasks assessed                                 General Comments: reports he was not taking pain meds last night      Exercises General Exercises - Lower Extremity Long Arc Quad: Strengthening;10 reps Heel Slides: Strengthening;10 reps Hip ABduction/ADduction: AROM;10 reps    General Comments        Pertinent Vitals/Pain Pain Assessment: 0-10 Pain Score: 6  Pain Location: L hip    Home Living                      Prior Function            PT Goals (current goals can now be found in the care plan section) Acute Rehab PT Goals Patient Stated Goal: go home Progress towards PT goals: Progressing toward goals    Frequency    BID      PT Plan Current plan remains appropriate  Co-evaluation              AM-PAC PT "6 Clicks" Mobility   Outcome Measure  Help needed turning from your back to your side while in a flat bed without using bedrails?: None Help needed moving from lying on your back to sitting on the side of a flat bed without using bedrails?: A Little Help needed moving to and from a bed to a chair (including a wheelchair)?: A Little Help needed standing up from a chair using your arms (e.g., wheelchair or bedside chair)?: A Little Help needed to walk in hospital room?: A Little Help needed climbing 3-5 steps with a railing? : A Lot 6 Click Score: 18    End of Session Equipment Utilized During Treatment: Gait belt Activity Tolerance: Patient tolerated treatment well Patient left: with call bell/phone within  reach;in chair;with nursing/sitter in room Nurse Communication: Mobility status PT Visit Diagnosis: Difficulty in walking, not elsewhere classified (R26.2);Muscle weakness (generalized) (M62.81);Pain;Unsteadiness on feet (R26.81) Pain - Right/Left: Left Pain - part of body: Hip     Time: 5621-3086 PT Time Calculation (min) (ACUTE ONLY): 32 min  Charges:  $Gait Training: 8-22 mins $Therapeutic Exercise: 8-22 mins                   Ramond Dial 01/16/2020, 11:26 AM  Mee Hives, PT MS Acute Rehab Dept. Number: [place] and [place]

## 2020-01-16 NOTE — Progress Notes (Signed)
Subjective: 2 Days Post-Op Procedure(s) (LRB): INTRAMEDULLARY (IM) NAIL INTERTROCHANTRIC (Left) Patient reports pain as mild.   Patient is well, and has had no acute complaints or problems Plan is to go Home after hospital stay. Negative for chest pain and shortness of breath Fever: no Gastrointestinal: Negative for nausea and vomiting  Objective: Vital signs in last 24 hours: Temp:  [97.7 F (36.5 C)-98.6 F (37 C)] 98.5 F (36.9 C) (06/11 2340) Pulse Rate:  [62-78] 78 (06/11 2340) Resp:  [16-17] 16 (06/11 2340) BP: (98-128)/(50-55) 128/53 (06/11 2340) SpO2:  [94 %-100 %] 100 % (06/11 2340)  Intake/Output from previous day:  Intake/Output Summary (Last 24 hours) at 01/16/2020 0724 Last data filed at 01/16/2020 0422 Gross per 24 hour  Intake 894.18 ml  Output 750 ml  Net 144.18 ml    Intake/Output this shift: No intake/output data recorded.  Labs: Recent Labs    01/13/20 2134 01/15/20 0633  HGB 10.2* 8.0*   Recent Labs    01/13/20 2134 01/15/20 0633  WBC 10.5 9.0  RBC 3.68* 2.89*  HCT 32.9* 25.5*  PLT 313 219   Recent Labs    01/14/20 0823 01/15/20 0633  NA 136 134*  K 5.1 5.2*  CL 111 111  CO2 16* 14*  BUN 64* 60*  CREATININE 3.77* 3.58*  GLUCOSE 124* 115*  CALCIUM 8.8* 8.0*   Recent Labs    01/13/20 2134  INR 1.3*     EXAM General - Patient is Alert and Oriented Extremity - Neurovascular intact Sensation intact distally Dorsiflexion/Plantar flexion intact Compartment soft Dressing/Incision - clean, dry, no drainage Motor Function - intact, moving foot and toes well on exam.  Ambulated 55 feet with physical therapy yesterday.  Past Medical History:  Diagnosis Date   (HFpEF) heart failure with preserved ejection fraction ([place])    a. 03/2019 Echo: EF 60-65%, nl RV fxn.   AAA (abdominal aortic aneurysm) ([place]) 09/2019   3.0 infrarenal AAA incidentally noted on abdominal/pelvic CT   Bladder cancer (HCC)    C. difficile colitis  04/2019   CKD (chronic kidney disease), stage IV (HCC)    COPD (chronic obstructive pulmonary disease) ([place])    Coronary artery disease    a. 10/2017 NSTEMI/Cath: LM 44m, 75d, LAD 75ost, 90p, D1 100, LCX 88m/d, OM1 90, RCA 25p; b. 10/2017 CABG x 4 ([facility]): LIMA->LAD, VG->Diag, VG->OM1, VG->OM2.   Hyperlipidemia    Hypertension    NSVT (nonsustained ventricular tachycardia) ([place])    a. 06/2019 [facility]: 2 runs of NSVT up to 12 beats, max rate 187 bpm.   PSVT (paroxysmal supraventricular tachycardia) ([place])    06/2019 [facility]io: 1820 episodes of SVT lasting up to 00:01:16 w/ max rate of 210.   Type 2 diabetes mellitus (HCC)     Assessment/Plan: 2 Days Post-Op Procedure(s) (LRB): INTRAMEDULLARY (IM) NAIL INTERTROCHANTRIC (Left) Principal Problem:   Closed left hip fracture, initial encounter ([place]) Active Problems:   Diabetes (HCC)   CKD (chronic kidney disease) stage 4, GFR 15-29 ml/min (HCC)   HTN (hypertension)   COPD (chronic obstructive pulmonary disease) (HCC)   Chronic heart failure with preserved ejection fraction (HFpEF) (HCC)   Coronary artery disease involving native coronary artery of native heart without angina pectoris   BPH (benign prostatic hyperplasia)   Accidental fall   Preoperative clearance   Presence of urostomy (HCC)  Estimated body mass index is 22.55 kg/m as calculated from the following:   Height as of this encounter: 5\' 7"  (1.702 m).  Weight as of this encounter: 65.3 kg. Advance diet Up with therapy D/C IV fluids Discharge home with home health when cleared by medicine. Plan to follow-up at [facility] clinic in 2 weeks for staple removal.  DVT Prophylaxis - Lovenox, Foot Pumps and TED hose Weight-Bearing as tolerated to left leg  Reche Dixon, PA-C Orthopaedic Surgery 01/16/2020, 7:24 AM

## 2020-01-16 NOTE — Progress Notes (Signed)
Physical Therapy Treatment Patient Details Name: Bryan Brewer MRN: [id number] DOB: 16-Sep-1940 Today's Date: 01/16/2020    History of Present Illness 79 y/o male s/p fall getting out of truck and fractured L hip, now s/p ORIF 6/11.    PT Comments    Pt was seen for mobility on RW to loosen up with gait then to walk on portable step.  Pt had vomited earlier and was tired, agreed to do limited ex with PT.  Pt is progressing but will have another therapy session in AM to work on his stair tolerance and safety.  Follow up with him for home therapy and will still need equipment to return home safely.   Follow Up Recommendations  Home health PT     Equipment Recommendations  Rolling walker with 5" wheels;3in1 (PT)    Recommendations for Other Services       Precautions / Restrictions Precautions Precautions: Fall Restrictions Weight Bearing Restrictions: Yes LLE Weight Bearing: Weight bearing as tolerated    Mobility  Bed Mobility Overal bed mobility: Needs Assistance             General bed mobility comments: up in chair when PT arrived  Transfers Overall transfer level: Needs assistance Equipment used: Rolling walker (2 wheeled) Transfers: Sit to/from Stand Sit to Stand: Min assist;Min guard         General transfer comment: hands on chair to ease standing transition  Ambulation/Gait Ambulation/Gait assistance: Min guard Gait Distance (Feet): 30 Feet Assistive device: Rolling walker (2 wheeled) Gait Pattern/deviations: Step-through pattern;Shuffle;Decreased weight shift to left;Wide base of support Gait velocity: controlled, slower Gait velocity interpretation: <1.31 ft/sec, indicative of household ambulator General Gait Details: Loosened up to climb stair   Stairs Stairs: Yes Stairs assistance: Mod assist Stair Management: With walker;Forwards;Step to pattern Number of Stairs: 2 General stair comments: Pt was in some discomfort and feeling weak  attempting stairs   Wheelchair Mobility    Modified Rankin (Stroke Patients Only)       Balance Overall balance assessment: Needs assistance Sitting-balance support: Feet supported Sitting balance-Leahy Scale: Good     Standing balance support: Bilateral upper extremity supported;During functional activity Standing balance-Leahy Scale: Fair Standing balance comment: one hand to stand and use other for adjusting clothing                            Cognition Arousal/Alertness: Awake/alert Behavior During Therapy: WFL for tasks assessed/performed Overall Cognitive Status: Within Functional Limits for tasks assessed                                 General Comments: reports he was not taking pain meds last night      Exercises General Exercises - Lower Extremity Long Arc Quad: Strengthening;10 reps Heel Slides: Strengthening;10 reps Hip ABduction/ADduction: AROM;10 reps    General Comments        Pertinent Vitals/Pain Pain Assessment: 0-10 Pain Score: 5  Pain Location: L hip    Home Living                      Prior Function            PT Goals (current goals can now be found in the care plan section) Acute Rehab PT Goals Patient Stated Goal: go home Progress towards PT goals: Progressing toward goals    Frequency  BID      PT Plan Current plan remains appropriate    Co-evaluation              AM-PAC PT "6 Clicks" Mobility   Outcome Measure  Help needed turning from your back to your side while in a flat bed without using bedrails?: None Help needed moving from lying on your back to sitting on the side of a flat bed without using bedrails?: A Little Help needed moving to and from a bed to a chair (including a wheelchair)?: A Little Help needed standing up from a chair using your arms (e.g., wheelchair or bedside chair)?: A Little Help needed to walk in hospital room?: A Little Help needed climbing 3-5  steps with a railing? : A Lot 6 Click Score: 18    End of Session Equipment Utilized During Treatment: Gait belt Activity Tolerance: Patient tolerated treatment well Patient left: with call bell/phone within reach;in chair;with nursing/sitter in room Nurse Communication: Mobility status PT Visit Diagnosis: Difficulty in walking, not elsewhere classified (R26.2);Muscle weakness (generalized) (M62.81);Pain;Unsteadiness on feet (R26.81) Pain - Right/Left: Left Pain - part of body: Hip     Time: 9735-3299 PT Time Calculation (min) (ACUTE ONLY): 26 min  Charges:  $Gait Training: 8-22 mins $Therapeutic Exercise: 8-22 mins                  Ramond Dial 01/16/2020, 8:00 PM  Mee Hives, PT MS Acute Rehab Dept. Number: [place] and [place]

## 2020-01-16 NOTE — Discharge Instructions (Signed)
INSTRUCTIONS AFTER Surgery  o Remove items at home which could result in a fall. This includes throw rugs or furniture in walking pathways o ICE to the affected joint every three hours while awake for 30 minutes at a time, for at least the first 3-5 days, and then as needed for pain and swelling.  Continue to use ice for pain and swelling. You may notice swelling that will progress down to the foot and ankle.  This is normal after surgery.  Elevate your leg when you are not up walking on it.   o Continue to use the breathing machine you got in the hospital (incentive spirometer) which will help keep your temperature down.  It is common for your temperature to cycle up and down following surgery, especially at night when you are not up moving around and exerting yourself.  The breathing machine keeps your lungs expanded and your temperature down.   DIET:  As you were doing prior to hospitalization, we recommend a well-balanced diet.  DRESSING / WOUND CARE / SHOWERING  Dressing changes needed.  No showering.  Staples will be removed in 2 weeks at [facility] clinic.  ACTIVITY  o Increase activity slowly as tolerated, but follow the weight bearing instructions below.   o No driving for 6 weeks or until further direction given by your physician.  You cannot drive while taking narcotics.  o No lifting or carrying greater than 10 lbs. until further directed by your surgeon. o Avoid periods of inactivity such as sitting longer than an hour when not asleep. This helps prevent blood clots.  o You may return to work once you are authorized by your doctor.     WEIGHT BEARING  Weightbearing as tolerated on the left.   EXERCISES Gait training and ambulation training with physical therapy.  CONSTIPATION  Constipation is defined medically as fewer than three stools per week and severe constipation as less than one stool per week.  Even if you have a regular bowel pattern at home, your normal regimen is  likely to be disrupted due to multiple reasons following surgery.  Combination of anesthesia, postoperative narcotics, change in appetite and fluid intake all can affect your bowels.   YOU MUST use at least one of the following options; they are listed in order of increasing strength to get the job done.  They are all available over the counter, and you may need to use some, POSSIBLY even all of these options:    Drink plenty of fluids (prune juice may be helpful) and high fiber foods Colace 100 mg by mouth twice a day  Senokot for constipation as directed and as needed Dulcolax (bisacodyl), take with full glass of water  Miralax (polyethylene glycol) once or twice a day as needed.  If you have tried all these things and are unable to have a bowel movement in the first 3-4 days after surgery call either your surgeon or your primary doctor.    If you experience loose stools or diarrhea, hold the medications until you stool forms back up.  If your symptoms do not get better within 1 week or if they get worse, check with your doctor.  If you experience "the worst abdominal pain ever" or develop nausea or vomiting, please contact the office immediately for further recommendations for treatment.   ITCHING:  If you experience itching with your medications, try taking only a single pain pill, or even half a pain pill at a time.  You  can also use Benadryl over the counter for itching or also to help with sleep.   TED HOSE STOCKINGS:  Use stockings on both legs until for at least 2 weeks or as directed by physician office. They may be removed at night for sleeping.  MEDICATIONS:  See your medication summary on the "After Visit Summary" that nursing will review with you.  You may have some home medications which will be placed on hold until you complete the course of blood thinner medication.  It is important for you to complete the blood thinner medication as prescribed.  PRECAUTIONS:  If you experience  chest pain or shortness of breath - call 911 immediately for transfer to the hospital emergency department.   If you develop a fever greater that 101 F, purulent drainage from wound, increased redness or drainage from wound, foul odor from the wound/dressing, or calf pain - CONTACT YOUR SURGEON.                                                   FOLLOW-UP APPOINTMENTS:  If you do not already have a post-op appointment, please call the office for an appointment to be seen by your surgeon.  Guidelines for how soon to be seen are listed in your "After Visit Summary", but are typically between 1-4 weeks after surgery.  OTHER INSTRUCTIONS:     MAKE SURE YOU:  . Understand these instructions.  . Get help right away if you are not doing well or get worse.    Thank you for letting us be a part of your medical care team.  It is a privilege we respect greatly.  We hope these instructions will help you stay on track for a fast and full recovery!

## 2020-01-17 LAB — IRON AND TIBC
Iron: 16 ug/dL — ABNORMAL LOW (ref 45–182)
Saturation Ratios: 12 % — ABNORMAL LOW (ref 17.9–39.5)
TIBC: 137 ug/dL — ABNORMAL LOW (ref 250–450)
UIBC: 121 ug/dL

## 2020-01-17 LAB — CBC
HCT: 23.8 % — ABNORMAL LOW (ref 39.0–52.0)
Hemoglobin: 7.3 g/dL — ABNORMAL LOW (ref 13.0–17.0)
MCH: 27.3 pg (ref 26.0–34.0)
MCHC: 30.7 g/dL (ref 30.0–36.0)
MCV: 89.1 fL (ref 80.0–100.0)
Platelets: 246 10*3/uL (ref 150–400)
RBC: 2.67 MIL/uL — ABNORMAL LOW (ref 4.22–5.81)
RDW: 14.4 % (ref 11.5–15.5)
WBC: 7.5 10*3/uL (ref 4.0–10.5)
nRBC: 0 % (ref 0.0–0.2)

## 2020-01-17 LAB — GLUCOSE, CAPILLARY
Glucose-Capillary: 89 mg/dL (ref 70–99)
Glucose-Capillary: 171 mg/dL — ABNORMAL HIGH (ref 70–99)
Glucose-Capillary: 142 mg/dL — ABNORMAL HIGH (ref 70–99)
Glucose-Capillary: 146 mg/dL — ABNORMAL HIGH (ref 70–99)

## 2020-01-17 LAB — BASIC METABOLIC PANEL
Anion gap: 8 (ref 5–15)
BUN: 79 mg/dL — ABNORMAL HIGH (ref 8–23)
CO2: 23 mmol/L (ref 22–32)
Calcium: 8.2 mg/dL — ABNORMAL LOW (ref 8.9–10.3)
Chloride: 106 mmol/L (ref 98–111)
Creatinine, Ser: 3.28 mg/dL — ABNORMAL HIGH (ref 0.61–1.24)
GFR calc Af Amer: 20 mL/min — ABNORMAL LOW (ref >60)
GFR calc non Af Amer: 17 mL/min — ABNORMAL LOW (ref >60)
Glucose, Bld: 109 mg/dL — ABNORMAL HIGH (ref 70–99)
Potassium: 3.9 mmol/L (ref 3.5–5.1)
Sodium: 137 mmol/L (ref 135–145)

## 2020-01-17 LAB — MAGNESIUM: Magnesium: 1.8 mg/dL (ref 1.7–2.4)

## 2020-01-17 LAB — FOLATE: Folate: 6.9 ng/mL (ref >5.9)

## 2020-01-17 NOTE — Progress Notes (Signed)
PROGRESS NOTE    Bryan Brewer  CZY:[id number] DOB: 03/12/1941 DOA: 01/13/2020 PCP: Tracie Harrier, MD    Assessment & Plan:   Principal Problem:   Closed left hip fracture, initial encounter ([place]) Active Problems:   Diabetes ([place])   CKD (chronic kidney disease) stage 4, GFR 15-29 ml/min (HCC)   HTN (hypertension)   COPD (chronic obstructive pulmonary disease) (HCC)   Pressure injury of skin   Chronic heart failure with preserved ejection fraction (HFpEF) (HCC)   Coronary artery disease involving native coronary artery of native heart without angina pectoris   BPH (benign prostatic hyperplasia)   Accidental fall   Preoperative clearance   Presence of urostomy ([place])    Bryan Brewer is a 79 y.o. Caucasian male with medical history significant for CAD, HFpEF, CKD 4, COPD, bladder cancer status post cystoprostatectomy with urostomy and ileal conduit, CAD s/p CABG followed by cardiologist, Dr. [place] Revel, last seen on 12/16/2019, who presents to the emergency room after suffering an accidental fall while getting out of his truck.  He fell onto his left hip also with impact to the left shoulder and elbow.   1. Closed left hip fracture, initial encounter s/p INTRAMEDULLARY (IM) NAIL INTERTROCHANTRIC on 01/14/20 -Patient presents with acute displaced left intertrochanteric hip fracture after an accidental fall trying to get out of his truck PLAN: --Pain management --PT rec home with HHPT --Plan to follow-up at [facility] clinic in 2 weeks for staple removal. --DVT Prophylaxis - Lovenox, Foot Pumps and TED hose --Weight-Bearing as tolerated to left leg  Hyperkalemia --K trended up to 5.6 this morning, despite having received Lokelma  --Kayexalate x1 today    Diabetes mellitus type II ([place]) -Sliding scale insulin coverage    CKD (chronic kidney disease) stage 4, GFR 15-29 ml/min (HCC) -Renal function at baseline -Continue sodium bicarbonate.  History of bladder cancer status post  cystoprostatectomy with urostomy and ileal conduit -Urostomy care per pt  Recurrent inguinal hernias    HTN (hypertension) --Hold home lasix and metop due to low BP --d/c MIVF    COPD (chronic obstructive pulmonary disease) (HCC) -Not acutely exacerbated -DuoNebs as needed    Chronic heart failure with preserved ejection fraction (HFpEF) ([place]) -Patient is euvolemic --Hold home lasix and metop due to low BP    Coronary artery disease involving native coronary artery of native heart without angina pectoris -Patient has cardiac history to include CAD, CABG and diastolic heart failure but was recently evaluated by his cardiologist on 12/16/2019 and found to be stable  -No complaints of chest pain and EKG with no acute ST-T wave changes --continue home aspirin, atorvastatin   Pressure Injury 01/14/20 Sacrum Stage 2, POA   DVT prophylaxis: Lovenox SQ Code Status: Full code  Family Communication:  Status is: inpatient Dispo:   The patient is from: home Anticipated d/c is to: home with HHPT Anticipated d/c date is: tomorrow Patient currently is not medically stable to d/c due to: potassium trending up, not responding to [facility] therapy.  Need to normalize potassium before discharge.   Subjective and Interval History:  Pt reported hip pain controlled.  No fever, dyspnea, chest pain, abdominal pain, N/V/D.  Pt had a large BM after Kayexalate.   Objective: Vitals:   01/16/20 0733 01/16/20 1555 01/17/20 0029 01/17/20 0751  BP: (!) 116/51 (!) 98/44 (!) 115/48 (!) 145/63  Pulse: 83 87 75 87  Resp: 17 17 17 17   Temp: 98 F (36.7 C) 97.7 F (36.5 C) 98.3  F (36.8 C) 97.9 F (36.6 C)  TempSrc: Oral Oral Oral Oral  SpO2: 100% 97% 98% 99%  Weight:      Height:        Intake/Output Summary (Last 24 hours) at 01/17/2020 1359 Last data filed at 01/17/2020 1309 Gross per 24 hour  Intake --  Output 1350 ml  Net -1350 ml   Filed Weights   01/13/20 2134  Weight: 65.3 kg     Examination:   Constitutional: NAD, AAOx3 HEENT: conjunctivae and lids normal, EOMI CV: RRR no M,R,G. Distal pulses +2.  No cyanosis.   RESP: CTA B/L, normal respiratory effort  GI: +BS, NTND, urostomy stoma healthy-appearing, outputting clear urine Extremities: No effusions, edema, or tenderness in BLE  Neuro: II - XII grossly intact.  Sensation intact Psych: Normal mood and affect.  Appropriate judgement and reason   Data Reviewed: I have personally reviewed following labs and imaging studies  CBC: Recent Labs  Lab 01/13/20 2134 01/15/20 0633 01/17/20 0420  WBC 10.5 9.0 7.5  NEUTROABS 9.5*  --   --   HGB 10.2* 8.0* 7.3*  HCT 32.9* 25.5* 23.8*  MCV 89.4 88.2 89.1  PLT 313 219 962   Basic Metabolic Panel: Recent Labs  Lab 01/13/20 2134 01/13/20 2134 01/14/20 0823 01/15/20 0633 01/16/20 0449 01/16/20 1703 01/17/20 0420  NA 133*  --  136 134* 133*  --  137  K 5.6*   < > 5.1 5.2* 5.6* 4.9 3.9  CL 107  --  111 111 107  --  106  CO2 17*  --  16* 14* 17*  --  23  GLUCOSE 184*  --  124* 115* 101*  --  109*  BUN 65*  --  64* 60* 72*  --  79*  CREATININE 3.89*  --  3.77* 3.58* 3.40*  --  3.28*  CALCIUM 8.7*  --  8.8* 8.0* 8.4*  --  8.2*  MG  --   --   --  1.7 1.8  --  1.8   < > = values in this interval not displayed.   GFR: Estimated Creatinine Clearance: 17.1 mL/min (A) (by C-G formula based on SCr of 3.28 mg/dL (H)). Liver Function Tests: Recent Labs  Lab 01/13/20 2134  AST 10*  ALT 10  ALKPHOS 80  BILITOT 0.7  PROT 7.8  ALBUMIN 3.0*   No results for input(s): LIPASE, AMYLASE in the last 168 hours. No results for input(s): AMMONIA in the last 168 hours. Coagulation Profile: Recent Labs  Lab 01/13/20 2134  INR 1.3*   Cardiac Enzymes: No results for input(s): CKTOTAL, CKMB, CKMBINDEX, TROPONINI in the last 168 hours. BNP (last 3 results) No results for input(s): PROBNP in the last 8760 hours. HbA1C: No results for input(s): HGBA1C in the last 72  hours. CBG: Recent Labs  Lab 01/16/20 1308 01/16/20 1723 01/16/20 2121 01/17/20 0752 01/17/20 1157  GLUCAP 140* 130* 167* 89 171*   Lipid Profile: No results for input(s): CHOL, HDL, LDLCALC, TRIG, CHOLHDL, LDLDIRECT in the last 72 hours. Thyroid Function Tests: No results for input(s): TSH, T4TOTAL, FREET4, T3FREE, THYROIDAB in the last 72 hours. Anemia Panel: No results for input(s): VITAMINB12, FOLATE, FERRITIN, TIBC, IRON, RETICCTPCT in the last 72 hours. Sepsis Labs: No results for input(s): PROCALCITON, LATICACIDVEN in the last 168 hours.  Recent Results (from the past 240 hour(s))  SARS Coronavirus 2 by RT PCR (hospital order, performed in [facility] hospital lab) Nasopharyngeal Nasopharyngeal Swab  Status: None   Collection Time: 01/13/20 10:33 PM   Specimen: Nasopharyngeal Swab  Result Value Ref Range Status   SARS Coronavirus 2 NEGATIVE NEGATIVE Final    Comment: (NOTE) SARS-CoV-2 target nucleic acids are NOT DETECTED. The SARS-CoV-2 RNA is generally detectable in upper and lower respiratory specimens during the acute phase of infection. The lowest concentration of SARS-CoV-2 viral copies this assay can detect is 250 copies / mL. A negative result does not preclude SARS-CoV-2 infection and should not be used as the sole basis for treatment or other patient management decisions.  A negative result may occur with improper specimen collection / handling, submission of specimen other than nasopharyngeal swab, presence of viral mutation(s) within the areas targeted by this assay, and inadequate number of viral copies (<250 copies / mL). A negative result must be combined with clinical observations, patient history, and epidemiological information. Fact Sheet for Patients:   StrictlyIdeas.no Fact Sheet for Healthcare Providers: BankingDealers.co.za This test is not yet approved or cleared  by the [place] FDA  and has been authorized for detection and/or diagnosis of SARS-CoV-2 by FDA under an Emergency Use Authorization (EUA).  This EUA will remain in effect (meaning this test can be used) for the duration of the COVID-19 declaration under Section 564(b)(1) of the Act, 21 U.S.C. section 360bbb-3(b)(1), unless the authorization is terminated or revoked sooner. Performed at [facility], [address] [place], [place] [address]   MRSA PCR Screening     Status: Abnormal   Collection Time: 01/15/20  9:44 AM   Specimen: Nasopharyngeal  Result Value Ref Range Status   MRSA by PCR POSITIVE (A) NEGATIVE Final    Comment:        The GeneXpert MRSA Assay (FDA approved for NASAL specimens only), is one component of a comprehensive MRSA colonization surveillance program. It is not intended to diagnose MRSA infection nor to guide or monitor treatment for MRSA infections. RESULT CALLED TO, READ BACK BY AND VERIFIED WITH: AMANDA FIELDS AT 1105 01/15/20.PMF Performed at [facility], [address] [place], [place] [address]       Radiology Studies: No results found.   Scheduled Meds: . aspirin EC  81 mg Oral QPM  . atorvastatin  80 mg Oral QHS  . Chlorhexidine Gluconate Cloth  6 each Topical Q0600  . docusate sodium  100 mg Oral BID  . enoxaparin (LOVENOX) injection  30 mg Subcutaneous Q24H  . feeding supplement (ENSURE ENLIVE)  237 mL Oral BID BM  . insulin aspart  0-15 Units Subcutaneous TID WC  . mupirocin ointment  1 application Nasal BID  . nutrition supplement (JUVEN)  1 packet Oral BID BM  . sodium bicarbonate  1,300 mg Oral TID   Continuous Infusions:    LOS: 4 days     Enzo Bi, MD [facility] If 7PM-7AM, please contact night-coverage 01/17/2020, 1:59 PM

## 2020-01-17 NOTE — Progress Notes (Addendum)
PROGRESS NOTE    Bryan Brewer  KGM:[id number] DOB: 05/04/1941 DOA: 01/13/2020 PCP: Tracie Harrier, MD    Assessment & Plan:   Principal Problem:   Closed left hip fracture, initial encounter ([place]) Active Problems:   Diabetes ([place])   CKD (chronic kidney disease) stage 4, GFR 15-29 ml/min (HCC)   HTN (hypertension)   COPD (chronic obstructive pulmonary disease) (HCC)   Pressure injury of skin   Chronic heart failure with preserved ejection fraction (HFpEF) (HCC)   Coronary artery disease involving native coronary artery of native heart without angina pectoris   BPH (benign prostatic hyperplasia)   Accidental fall   Preoperative clearance   Presence of urostomy ([place])    Bryan Brewer is a 79 y.o. Caucasian male with medical history significant for CAD, HFpEF, CKD 4, COPD, bladder cancer status post cystoprostatectomy with urostomy and ileal conduit, CAD s/p CABG followed by cardiologist, Dr. Saunders Revel, last seen on 12/16/2019, who presents to the emergency room after suffering an accidental fall while getting out of his truck.  He fell onto his left hip also with impact to the left shoulder and elbow.   1. Closed left hip fracture, initial encounter s/p INTRAMEDULLARY (IM) NAIL INTERTROCHANTRIC on 01/14/20 -Patient presents with acute displaced left intertrochanteric hip fracture after an accidental fall trying to get out of his truck PLAN: --Pain management --PT rec home with HHPT --Plan to follow-up at [facility] clinic in 2 weeks for staple removal. --DVT Prophylaxis - Lovenox, Foot Pumps and TED hose --Weight-Bearing as tolerated to left leg  Worsening anemia --Hgb 10.2 on presentation, trended down to 7.3 this morning.  No obvious blood loss.   PLAN: --anemia workup --Monitor Hgb to ensure stability before discharge. --transfuse to keep Hgb >7  Hyperkalemia, resolved --K trended up to 5.6 on 6/12, despite having received Lokelma x2.  Resolved after Kayexalate x1 and large  BM.    Diabetes mellitus type II ([place]) -Sliding scale insulin coverage    CKD (chronic kidney disease) stage 4, GFR 15-29 ml/min (HCC) -Renal function at baseline -Continue sodium bicarbonate.  History of bladder cancer status post cystoprostatectomy with urostomy and ileal conduit -Urostomy care per pt  Recurrent inguinal hernias    HTN (hypertension) --Hold home lasix and metop due to low BP --d/c MIVF    COPD (chronic obstructive pulmonary disease) (HCC) -Not acutely exacerbated -DuoNebs as needed    Chronic heart failure with preserved ejection fraction (HFpEF) ([place]) -Patient is euvolemic --Hold home lasix and metop due to low BP    Coronary artery disease involving native coronary artery of native heart without angina pectoris -Patient has cardiac history to include CAD, CABG and diastolic heart failure but was recently evaluated by his cardiologist on 12/16/2019 and found to be stable  -No complaints of chest pain and EKG with no acute ST-T wave changes --continue home aspirin, atorvastatin   Pressure Injury 01/14/20 Sacrum Stage 2, POA   DVT prophylaxis: Lovenox SQ Code Status: Full code  Family Communication:  Status is: inpatient Dispo:   The patient is from: home Anticipated d/c is to: home with HHPT Anticipated d/c date is: tomorrow if Hgb stable Patient currently is not medically stable to d/c due to: Hgb continues to trend down, to 7.3 this morning.  Need to ensure stability of Hgb before discharge.     Subjective and Interval History:  Potassium normalized after a large BM after Kayexalate.  However, Hgb continued to trend down, though no obvious bleeding.  No fever, dyspnea, chest pain, abdominal pain, N/V/D.   Objective: Vitals:   01/16/20 1555 01/17/20 0029 01/17/20 0751 01/17/20 1539  BP: (!) 98/44 (!) 115/48 (!) 145/63 (!) 117/50  Pulse: 87 75 87 97  Resp: 17 17 17 15   Temp: 97.7 F (36.5 C) 98.3 F (36.8 C) 97.9 F (36.6 C) 98.8 F  (37.1 C)  TempSrc: Oral Oral Oral Oral  SpO2: 97% 98% 99% 96%  Weight:      Height:        Intake/Output Summary (Last 24 hours) at 01/17/2020 1549 Last data filed at 01/17/2020 1309 Gross per 24 hour  Intake --  Output 1350 ml  Net -1350 ml   Filed Weights   01/13/20 2134  Weight: 65.3 kg    Examination:   Constitutional: NAD, AAOx3, sitting up in chair HEENT: conjunctivae and lids normal, EOMI CV: irregular, tachycardic. Distal pulses +2.  No cyanosis.   RESP: CTA B/L, normal respiratory effort  GI: +BS, NTND, urostomy stoma healthy-appearing, outputting clear urine Extremities: No effusions, edema, or tenderness in BLE  Neuro: II - XII grossly intact.  Sensation intact Psych: Normal mood and affect.     Data Reviewed: I have personally reviewed following labs and imaging studies  CBC: Recent Labs  Lab 01/13/20 2134 01/15/20 0633 01/17/20 0420  WBC 10.5 9.0 7.5  NEUTROABS 9.5*  --   --   HGB 10.2* 8.0* 7.3*  HCT 32.9* 25.5* 23.8*  MCV 89.4 88.2 89.1  PLT 313 219 188   Basic Metabolic Panel: Recent Labs  Lab 01/13/20 2134 01/13/20 2134 01/14/20 0823 01/15/20 0633 01/16/20 0449 01/16/20 1703 01/17/20 0420  NA 133*  --  136 134* 133*  --  137  K 5.6*   < > 5.1 5.2* 5.6* 4.9 3.9  CL 107  --  111 111 107  --  106  CO2 17*  --  16* 14* 17*  --  23  GLUCOSE 184*  --  124* 115* 101*  --  109*  BUN 65*  --  64* 60* 72*  --  79*  CREATININE 3.89*  --  3.77* 3.58* 3.40*  --  3.28*  CALCIUM 8.7*  --  8.8* 8.0* 8.4*  --  8.2*  MG  --   --   --  1.7 1.8  --  1.8   < > = values in this interval not displayed.   GFR: Estimated Creatinine Clearance: 17.1 mL/min (A) (by C-G formula based on SCr of 3.28 mg/dL (H)). Liver Function Tests: Recent Labs  Lab 01/13/20 2134  AST 10*  ALT 10  ALKPHOS 80  BILITOT 0.7  PROT 7.8  ALBUMIN 3.0*   No results for input(s): LIPASE, AMYLASE in the last 168 hours. No results for input(s): AMMONIA in the last 168  hours. Coagulation Profile: Recent Labs  Lab 01/13/20 2134  INR 1.3*   Cardiac Enzymes: No results for input(s): CKTOTAL, CKMB, CKMBINDEX, TROPONINI in the last 168 hours. BNP (last 3 results) No results for input(s): PROBNP in the last 8760 hours. HbA1C: No results for input(s): HGBA1C in the last 72 hours. CBG: Recent Labs  Lab 01/16/20 1308 01/16/20 1723 01/16/20 2121 01/17/20 0752 01/17/20 1157  GLUCAP 140* 130* 167* 89 171*   Lipid Profile: No results for input(s): CHOL, HDL, LDLCALC, TRIG, CHOLHDL, LDLDIRECT in the last 72 hours. Thyroid Function Tests: No results for input(s): TSH, T4TOTAL, FREET4, T3FREE, THYROIDAB in the last 72 hours. Anemia Panel: No results  for input(s): VITAMINB12, FOLATE, FERRITIN, TIBC, IRON, RETICCTPCT in the last 72 hours. Sepsis Labs: No results for input(s): PROCALCITON, LATICACIDVEN in the last 168 hours.  Recent Results (from the past 240 hour(s))  SARS Coronavirus 2 by RT PCR (hospital order, performed in [facility] hospital lab) Nasopharyngeal Nasopharyngeal Swab     Status: None   Collection Time: 01/13/20 10:33 PM   Specimen: Nasopharyngeal Swab  Result Value Ref Range Status   SARS Coronavirus 2 NEGATIVE NEGATIVE Final    Comment: (NOTE) SARS-CoV-2 target nucleic acids are NOT DETECTED. The SARS-CoV-2 RNA is generally detectable in upper and lower respiratory specimens during the acute phase of infection. The lowest concentration of SARS-CoV-2 viral copies this assay can detect is 250 copies / mL. A negative result does not preclude SARS-CoV-2 infection and should not be used as the sole basis for treatment or other patient management decisions.  A negative result may occur with improper specimen collection / handling, submission of specimen other than nasopharyngeal swab, presence of viral mutation(s) within the areas targeted by this assay, and inadequate number of viral copies (<250 copies / mL). A negative result must be  combined with clinical observations, patient history, and epidemiological information. Fact Sheet for Patients:   StrictlyIdeas.no Fact Sheet for Healthcare Providers: BankingDealers.co.za This test is not yet approved or cleared  by the [place] FDA and has been authorized for detection and/or diagnosis of SARS-CoV-2 by FDA under an Emergency Use Authorization (EUA).  This EUA will remain in effect (meaning this test can be used) for the duration of the COVID-19 declaration under Section 564(b)(1) of the Act, 21 U.S.C. section 360bbb-3(b)(1), unless the authorization is terminated or revoked sooner. Performed at [facility], [address] [place], [place] [address]   MRSA PCR Screening     Status: Abnormal   Collection Time: 01/15/20  9:44 AM   Specimen: Nasopharyngeal  Result Value Ref Range Status   MRSA by PCR POSITIVE (A) NEGATIVE Final    Comment:        The GeneXpert MRSA Assay (FDA approved for NASAL specimens only), is one component of a comprehensive MRSA colonization surveillance program. It is not intended to diagnose MRSA infection nor to guide or monitor treatment for MRSA infections. RESULT CALLED TO, READ BACK BY AND VERIFIED WITH: AMANDA FIELDS AT 1105 01/15/20.PMF Performed at [facility], [address] [place], [place] [address]       Radiology Studies: No results found.   Scheduled Meds: . aspirin EC  81 mg Oral QPM  . atorvastatin  80 mg Oral QHS  . Chlorhexidine Gluconate Cloth  6 each Topical Q0600  . docusate sodium  100 mg Oral BID  . enoxaparin (LOVENOX) injection  30 mg Subcutaneous Q24H  . feeding supplement (ENSURE ENLIVE)  237 mL Oral BID BM  . insulin aspart  0-15 Units Subcutaneous TID WC  . mupirocin ointment  1 application Nasal BID  . nutrition supplement (JUVEN)  1 packet Oral BID BM  . sodium bicarbonate  1,300 mg Oral TID   Continuous Infusions:     LOS: 4 days     Enzo Bi, MD [facility] If 7PM-7AM, please contact night-coverage 01/17/2020, 3:49 PM

## 2020-01-17 NOTE — Plan of Care (Signed)
  Problem: Education: Goal: Knowledge of General Education information will improve Description Including pain rating scale, medication(s)/side effects and non-pharmacologic comfort measures Outcome: Progressing   

## 2020-01-17 NOTE — Progress Notes (Signed)
Subjective: 3 Days Post-Op Procedure(s) (LRB): INTRAMEDULLARY (IM) NAIL INTERTROCHANTRIC (Left) Patient reports pain as mild.   Patient is well, and has had no acute complaints or problems Plan is to go Home after hospital stay. Negative for chest pain and shortness of breath Fever: no Gastrointestinal: Negative for nausea and vomiting  Objective: Vital signs in last 24 hours: Temp:  [97.7 F (36.5 C)-98.3 F (36.8 C)] 98.3 F (36.8 C) (06/13 0029) Pulse Rate:  [75-87] 75 (06/13 0029) Resp:  [17] 17 (06/13 0029) BP: (98-116)/(44-51) 115/48 (06/13 0029) SpO2:  [97 %-100 %] 98 % (06/13 0029)  Intake/Output from previous day:  Intake/Output Summary (Last 24 hours) at 01/17/2020 0719 Last data filed at 01/17/2020 0400 Gross per 24 hour  Intake --  Output 1300 ml  Net -1300 ml    Intake/Output this shift: No intake/output data recorded.  Labs: Recent Labs    01/15/20 0633 01/17/20 0420  HGB 8.0* 7.3*   Recent Labs    01/15/20 0633 01/17/20 0420  WBC 9.0 7.5  RBC 2.89* 2.67*  HCT 25.5* 23.8*  PLT 219 246   Recent Labs    01/16/20 0449 01/16/20 0449 01/16/20 1703 01/17/20 0420  NA 133*  --   --  137  K 5.6*   < > 4.9 3.9  CL 107  --   --  106  CO2 17*  --   --  23  BUN 72*  --   --  79*  CREATININE 3.40*  --   --  3.28*  GLUCOSE 101*  --   --  109*  CALCIUM 8.4*  --   --  8.2*   < > = values in this interval not displayed.   No results for input(s): LABPT, INR in the last 72 hours.   EXAM General - Patient is Alert and Oriented Extremity - Neurovascular intact Sensation intact distally Dorsiflexion/Plantar flexion intact Compartment soft Dressing/Incision - clean, dry, no drainage Motor Function - intact, moving foot and toes well on exam.  Ambulated 30 feet with physical therapy yesterday.  Past Medical History:  Diagnosis Date  . (HFpEF) heart failure with preserved ejection fraction ([place])    a. 03/2019 Echo: EF 60-65%, nl RV fxn.  Marland Kitchen AAA  (abdominal aortic aneurysm) ([place]) 09/2019   3.0 infrarenal AAA incidentally noted on abdominal/pelvic CT  . Bladder cancer ([place])   . C. difficile colitis 04/2019  . CKD (chronic kidney disease), stage IV ([place])   . COPD (chronic obstructive pulmonary disease) ([place])   . Coronary artery disease    a. 10/2017 NSTEMI/Cath: LM 19m, 75d, LAD 75ost, 90p, D1 100, LCX 53m/d, OM1 90, RCA 25p; b. 10/2017 CABG x 4 ([facility]): LIMA->LAD, VG->Diag, VG->OM1, VG->OM2.  . Hyperlipidemia   . Hypertension   . NSVT (nonsustained ventricular tachycardia) ([place])    a. 06/2019 [facility]io: 2 runs of NSVT up to 12 beats, max rate 187 bpm.  . PSVT (paroxysmal supraventricular tachycardia) ([place])    06/2019 [facility]io: 1820 episodes of SVT lasting up to 00:01:16 w/ max rate of 210.  . Type 2 diabetes mellitus (HCC)     Assessment/Plan: 3 Days Post-Op Procedure(s) (LRB): INTRAMEDULLARY (IM) NAIL INTERTROCHANTRIC (Left) Principal Problem:   Closed left hip fracture, initial encounter ([place]) Active Problems:   Diabetes ([place])   CKD (chronic kidney disease) stage 4, GFR 15-29 ml/min (HCC)   HTN (hypertension)   COPD (chronic obstructive pulmonary disease) (HCC)   Pressure injury of skin   Chronic heart failure  with preserved ejection fraction (HFpEF) ([place])   Coronary artery disease involving native coronary artery of native heart without angina pectoris   BPH (benign prostatic hyperplasia)   Accidental fall   Preoperative clearance   Presence of urostomy (HCC)  Estimated body mass index is 22.55 kg/m as calculated from the following:   Height as of this encounter: 5\' 7"  (1.702 m).   Weight as of this encounter: 65.3 kg. Advance diet Up with therapy D/C IV fluids Discharge home with home health when cleared by medicine. Hemoglobin 7.3 this morning.  We will most likely receive transfused blood, per medicine. Plan to follow-up at [facility] clinic in 2 weeks for staple removal.  DVT Prophylaxis - Lovenox, Foot Pumps and TED  hose Weight-Bearing as tolerated to left leg  Reche Dixon, PA-C Orthopaedic Surgery 01/17/2020, 7:19 AM

## 2020-01-17 NOTE — Progress Notes (Signed)
Physical Therapy Treatment Patient Details Name: Bryan Brewer MRN: [id number] DOB: [birth year] Today's Date: 01/17/2020    History of Present Illness 79 y/o male s/p fall getting out of truck and fractured L hip, now s/p ORIF 6/11.    PT Comments    Participated in exercises as described below.  Stood with min a x 1.  He is able to walk to door and back with RW and min assist.  Pt fatigued with effort and c/o SOB.  Sats monitored throughout session and he does decrease from 99% to 91% with gait.  Of note HR 110's at rest and does decrease to 86 after gait.  He does not have a-fib per chart and stated his HR did the same on a previous admission.  Discussed with MD who entered room after session.  Stairs were deferred at this time given SOB, 7.3 HgB and HR response to ex.  Pt continues to state he wishes to go home today.  SNF would be appropriate given continued difficulty with mobility but he is refusing.  He does state if he cannot manage at home he will consider it but has no interest in going there upon discharge "I will heal better at home."  If pt is discharged by MD or decides to go AMA, stair training would be beneficial before discharge.     Follow Up Recommendations  Home health PT;Supervision for mobility/OOB - pt refusing SNF so recommendations left as previous but if he reconsiders it would be beneficial.      Equipment Recommendations  Rolling walker with 5" wheels;3in1 (PT)    Recommendations for Other Services       Precautions / Restrictions Precautions Precautions: Fall Restrictions Weight Bearing Restrictions: Yes LLE Weight Bearing: Weight bearing as tolerated    Mobility  Bed Mobility Overal bed mobility: Needs Assistance             General bed mobility comments: up in chair when PT arrived  Transfers Overall transfer level: Needs assistance Equipment used: Rolling walker (2 wheeled) Transfers: Sit to/from Stand Sit to Stand: Min assist;Min  guard         General transfer comment: hands on chair to ease standing transition  Ambulation/Gait Ambulation/Gait assistance: Min guard   Assistive device: Rolling walker (2 wheeled) Gait Pattern/deviations: Step-through pattern;Shuffle;Decreased weight shift to left;Wide base of support Gait velocity: controlled, slower       Stairs             Wheelchair Mobility    Modified Rankin (Stroke Patients Only)       Balance Overall balance assessment: Needs assistance Sitting-balance support: Feet supported Sitting balance-Leahy Scale: Good     Standing balance support: Bilateral upper extremity supported;During functional activity Standing balance-Leahy Scale: Fair                              Cognition Arousal/Alertness: Awake/alert Behavior During Therapy: WFL for tasks assessed/performed Overall Cognitive Status: Within Functional Limits for tasks assessed                                 General Comments: reports he was not taking pain meds last night      Exercises Other Exercises Other Exercises: seated LAQ, ab/add and ankle pumps x 10, standing marches and SLR x 5 BLE with min a x 1.  General Comments        Pertinent Vitals/Pain Pain Assessment: Faces Faces Pain Scale: Hurts even more Pain Location: L hip and knee (arthritis) Pain Descriptors / Indicators: Sore Pain Intervention(s): Limited activity within patient's tolerance;Monitored during session;Repositioned    Home Living                      Prior Function            PT Goals (current goals can now be found in the care plan section) Acute Rehab PT Goals Patient Stated Goal: go home    Frequency    BID      PT Plan Current plan remains appropriate    Co-evaluation              AM-PAC PT "6 Clicks" Mobility   Outcome Measure  Help needed turning from your back to your side while in a flat bed without using bedrails?:  None Help needed moving from lying on your back to sitting on the side of a flat bed without using bedrails?: A Little Help needed moving to and from a bed to a chair (including a wheelchair)?: A Little Help needed standing up from a chair using your arms (e.g., wheelchair or bedside chair)?: A Little Help needed to walk in hospital room?: A Little Help needed climbing 3-5 steps with a railing? : A Lot 6 Click Score: 18    End of Session Equipment Utilized During Treatment: Gait belt Activity Tolerance: Patient tolerated treatment well;Patient limited by fatigue Patient left: with call bell/phone within reach;in chair Nurse Communication: Mobility status Pain - Right/Left: Left Pain - part of body: Hip     Time: 0630-1601 PT Time Calculation (min) (ACUTE ONLY): 24 min  Charges:  $Gait Training: 8-22 mins $Therapeutic Exercise: 8-22 mins                    Chesley Noon, PTA 01/17/20, 11:48 AM

## 2020-01-18 LAB — GLUCOSE, CAPILLARY
Glucose-Capillary: 108 mg/dL — ABNORMAL HIGH (ref 70–99)
Glucose-Capillary: 136 mg/dL — ABNORMAL HIGH (ref 70–99)
Glucose-Capillary: 160 mg/dL — ABNORMAL HIGH (ref 70–99)

## 2020-01-18 LAB — MAGNESIUM: Magnesium: 1.7 mg/dL (ref 1.7–2.4)

## 2020-01-18 LAB — BASIC METABOLIC PANEL
Anion gap: 8 (ref 5–15)
BUN: 80 mg/dL — ABNORMAL HIGH (ref 8–23)
CO2: 22 mmol/L (ref 22–32)
Calcium: 8.1 mg/dL — ABNORMAL LOW (ref 8.9–10.3)
Chloride: 107 mmol/L (ref 98–111)
Creatinine, Ser: 3.19 mg/dL — ABNORMAL HIGH (ref 0.61–1.24)
GFR calc Af Amer: 20 mL/min — ABNORMAL LOW (ref >60)
GFR calc non Af Amer: 18 mL/min — ABNORMAL LOW (ref >60)
Glucose, Bld: 116 mg/dL — ABNORMAL HIGH (ref 70–99)
Potassium: 4.1 mmol/L (ref 3.5–5.1)
Sodium: 137 mmol/L (ref 135–145)

## 2020-01-18 LAB — CBC
HCT: 20 % — ABNORMAL LOW (ref 39.0–52.0)
Hemoglobin: 6.7 g/dL — ABNORMAL LOW (ref 13.0–17.0)
MCH: 28.5 pg (ref 26.0–34.0)
MCHC: 33.5 g/dL (ref 30.0–36.0)
MCV: 85.1 fL (ref 80.0–100.0)
Platelets: 238 10*3/uL (ref 150–400)
RBC: 2.35 MIL/uL — ABNORMAL LOW (ref 4.22–5.81)
RDW: 14.5 % (ref 11.5–15.5)
WBC: 7.2 10*3/uL (ref 4.0–10.5)
nRBC: 0 % (ref 0.0–0.2)

## 2020-01-18 LAB — HEMOGLOBIN AND HEMATOCRIT, BLOOD
HCT: 29.1 % — ABNORMAL LOW (ref 39.0–52.0)
Hemoglobin: 9.1 g/dL — ABNORMAL LOW (ref 13.0–17.0)

## 2020-01-18 LAB — VITAMIN B12: Vitamin B-12: 1576 pg/mL — ABNORMAL HIGH (ref 180–914)

## 2020-01-18 LAB — PREPARE RBC (CROSSMATCH)

## 2020-01-18 MED ORDER — SODIUM CHLORIDE 0.9% IV SOLUTION: Freq: Once | INTRAVENOUS | Status: AC

## 2020-01-18 MED ORDER — METOPROLOL TARTRATE 25 MG PO TABS
25.0000 mg | ORAL_TABLET | Freq: Two times a day (BID) | ORAL | Status: DC
  Administered 2020-01-18: 25 mg via ORAL
  Filled 2020-01-18: qty 1

## 2020-01-18 NOTE — Progress Notes (Signed)
PROGRESS NOTE    Bryan Brewer  TMH:[id number] DOB: [birth year] DOA: 01/13/2020 PCP: Tracie Harrier, MD    Assessment & Plan:   Principal Problem:   Closed left hip fracture, initial encounter ([place]) Active Problems:   Diabetes ([place])   CKD (chronic kidney disease) stage 4, GFR 15-29 ml/min (HCC)   HTN (hypertension)   COPD (chronic obstructive pulmonary disease) (HCC)   Pressure injury of skin   Chronic heart failure with preserved ejection fraction (HFpEF) (HCC)   Coronary artery disease involving native coronary artery of native heart without angina pectoris   BPH (benign prostatic hyperplasia)   Accidental fall   Preoperative clearance   Presence of urostomy ([place])    Bryan Brewer is a 79 y.o. Caucasian male with medical history significant for CAD, HFpEF, CKD 4, COPD, bladder cancer status post cystoprostatectomy with urostomy and ileal conduit, CAD s/p CABG followed by cardiologist, Dr. Saunders Revel, last seen on 12/16/2019, who presents to the emergency room after suffering an accidental fall while getting out of his truck.  He fell onto his left hip also with impact to the left shoulder and elbow.   1. Closed left hip fracture, initial encounter s/p INTRAMEDULLARY (IM) NAIL INTERTROCHANTRIC on 01/14/20 -Patient presents with acute displaced left intertrochanteric hip fracture after an accidental fall trying to get out of his truck PLAN: --Pain management --PT rec home with HHPT --Plan to follow-up at [facility] clinic in 2 weeks for staple removal. --DVT Prophylaxis - Lovenox, Foot Pumps and TED hose --Weight-Bearing as tolerated to left leg  Worsening anemia, iron def --Hgb 10.2 on presentation, trending down.  No obvious bleeding.  Anemia workup showed iron def.  Maybe also have bone marrow suppression due to acute illness and trauma. PLAN: --1u pRBC today for Hgb 6.7 --Monitor Hgb to ensure stability before discharge. --transfuse to keep Hgb >7 --continue DVT ppx with  Lovenox since no obvious bleeding and recommended by ortho post-op  Hyperkalemia, resolved --K trended up to 5.6 on 6/12, despite having received Lokelma x2.  Resolved after Kayexalate x1 and large BM.    Diabetes mellitus type II ([place]) -Sliding scale insulin coverage    CKD (chronic kidney disease) stage 4, GFR 15-29 ml/min (HCC) -Renal function at baseline -Continue sodium bicarbonate.  History of bladder cancer status post cystoprostatectomy with urostomy and ileal conduit -Urostomy care per pt  Recurrent inguinal hernias    HTN (hypertension) --home lasix and metop were held due to low BP --resume home metop today with BP now more elevated    COPD (chronic obstructive pulmonary disease) (HCC) -Not acutely exacerbated -DuoNebs as needed    Chronic heart failure with preserved ejection fraction (HFpEF) ([place]) -Patient is euvolemic --home lasix and metop were held due to low BP --resume home metop today with BP now more elevated    Coronary artery disease involving native coronary artery of native heart without angina pectoris -Patient has cardiac history to include CAD, CABG and diastolic heart failure but was recently evaluated by his cardiologist on 12/16/2019 and found to be stable  -No complaints of chest pain and EKG with no acute ST-T wave changes --continue home aspirin, atorvastatin   Pressure Injury 01/14/20 Sacrum Stage 2, POA   DVT prophylaxis: Lovenox SQ Code Status: Full code  Family Communication:  Status is: inpatient Dispo:   The patient is from: home Anticipated d/c is to: home with HHPT Anticipated d/c date is: tomorrow if Hgb stable Patient currently is not medically stable to  d/c due to: Hgb continues to trend down, to 6.7 this morning.  Need to ensure stability of Hgb before discharge.     Subjective and Interval History:  Pt had no complaints.  No fever, dyspnea, chest pain, abdominal pain, N/V/D.  Hgb continued to drop, now <7 and  required transfusion.  Pt very disappointed that he couldn't be discharged yet.   Objective: Vitals:   01/18/20 1128 01/18/20 1135 01/18/20 1425 01/18/20 1650  BP: (!) 128/55 (!) 128/55 (!) 145/63 (!) 141/58  Pulse: 76 76 79 96  Resp: 16 16 16 18   Temp: 98.7 F (37.1 C) 98.7 F (37.1 C) 98 F (36.7 C) 99 F (37.2 C)  TempSrc:  Oral Oral Oral  SpO2:  100% 100% 95%  Weight:      Height:        Intake/Output Summary (Last 24 hours) at 01/18/2020 1808 Last data filed at 01/18/2020 0915 Gross per 24 hour  Intake --  Output 2025 ml  Net -2025 ml   Filed Weights   01/13/20 2134  Weight: 65.3 kg    Examination:   Constitutional: NAD, AAOx3, sitting up in chair HEENT: conjunctivae and lids normal, EOMI CV: irregular, tachycardic. Distal pulses +2.  No cyanosis.   RESP: CTA B/L, normal respiratory effort  GI: +BS, NTND, urostomy stoma healthy-appearing, outputting clear urine Extremities: No effusions, edema, or tenderness in BLE  Neuro: II - XII grossly intact.  Sensation intact Psych: Normal mood and affect.     Data Reviewed: I have personally reviewed following labs and imaging studies  CBC: Recent Labs  Lab 01/13/20 2134 01/15/20 0633 01/17/20 0420 01/18/20 0349 01/18/20 1518  WBC 10.5 9.0 7.5 7.2  --   NEUTROABS 9.5*  --   --   --   --   HGB 10.2* 8.0* 7.3* 6.7* 9.1*  HCT 32.9* 25.5* 23.8* 20.0* 29.1*  MCV 89.4 88.2 89.1 85.1  --   PLT 313 219 246 238  --    Basic Metabolic Panel: Recent Labs  Lab 01/14/20 0823 01/14/20 0823 01/15/20 0633 01/16/20 0449 01/16/20 1703 01/17/20 0420 01/18/20 0349  NA 136  --  134* 133*  --  137 137  K 5.1   < > 5.2* 5.6* 4.9 3.9 4.1  CL 111  --  111 107  --  106 107  CO2 16*  --  14* 17*  --  23 22  GLUCOSE 124*  --  115* 101*  --  109* 116*  BUN 64*  --  60* 72*  --  79* 80*  CREATININE 3.77*  --  3.58* 3.40*  --  3.28* 3.19*  CALCIUM 8.8*  --  8.0* 8.4*  --  8.2* 8.1*  MG  --   --  1.7 1.8  --  1.8 1.7   < > =  values in this interval not displayed.   GFR: Estimated Creatinine Clearance: 17.6 mL/min (A) (by C-G formula based on SCr of 3.19 mg/dL (H)). Liver Function Tests: Recent Labs  Lab 01/13/20 2134  AST 10*  ALT 10  ALKPHOS 80  BILITOT 0.7  PROT 7.8  ALBUMIN 3.0*   No results for input(s): LIPASE, AMYLASE in the last 168 hours. No results for input(s): AMMONIA in the last 168 hours. Coagulation Profile: Recent Labs  Lab 01/13/20 2134  INR 1.3*   Cardiac Enzymes: No results for input(s): CKTOTAL, CKMB, CKMBINDEX, TROPONINI in the last 168 hours. BNP (last 3 results) No results for input(s):  PROBNP in the last 8760 hours. HbA1C: No results for input(s): HGBA1C in the last 72 hours. CBG: Recent Labs  Lab 01/17/20 1640 01/17/20 2121 01/18/20 0802 01/18/20 1138 01/18/20 1648  GLUCAP 142* 146* 108* 136* 160*   Lipid Profile: No results for input(s): CHOL, HDL, LDLCALC, TRIG, CHOLHDL, LDLDIRECT in the last 72 hours. Thyroid Function Tests: No results for input(s): TSH, T4TOTAL, FREET4, T3FREE, THYROIDAB in the last 72 hours. Anemia Panel: Recent Labs    01/17/20 1613  VITAMINB12 1,576*  FOLATE 6.9  TIBC 137*  IRON 16*   Sepsis Labs: No results for input(s): PROCALCITON, LATICACIDVEN in the last 168 hours.  Recent Results (from the past 240 hour(s))  SARS Coronavirus 2 by RT PCR (hospital order, performed in [facility] hospital lab) Nasopharyngeal Nasopharyngeal Swab     Status: None   Collection Time: 01/13/20 10:33 PM   Specimen: Nasopharyngeal Swab  Result Value Ref Range Status   SARS Coronavirus 2 NEGATIVE NEGATIVE Final    Comment: (NOTE) SARS-CoV-2 target nucleic acids are NOT DETECTED. The SARS-CoV-2 RNA is generally detectable in upper and lower respiratory specimens during the acute phase of infection. The lowest concentration of SARS-CoV-2 viral copies this assay can detect is 250 copies / mL. A negative result does not preclude SARS-CoV-2  infection and should not be used as the sole basis for treatment or other patient management decisions.  A negative result may occur with improper specimen collection / handling, submission of specimen other than nasopharyngeal swab, presence of viral mutation(s) within the areas targeted by this assay, and inadequate number of viral copies (<250 copies / mL). A negative result must be combined with clinical observations, patient history, and epidemiological information. Fact Sheet for Patients:   StrictlyIdeas.no Fact Sheet for Healthcare Providers: BankingDealers.co.za This test is not yet approved or cleared  by the [place] FDA and has been authorized for detection and/or diagnosis of SARS-CoV-2 by FDA under an Emergency Use Authorization (EUA).  This EUA will remain in effect (meaning this test can be used) for the duration of the COVID-19 declaration under Section 564(b)(1) of the Act, 21 U.S.C. section 360bbb-3(b)(1), unless the authorization is terminated or revoked sooner. Performed at [facility], [address] [place], [place] [address]   MRSA PCR Screening     Status: Abnormal   Collection Time: 01/15/20  9:44 AM   Specimen: Nasopharyngeal  Result Value Ref Range Status   MRSA by PCR POSITIVE (A) NEGATIVE Final    Comment:        The GeneXpert MRSA Assay (FDA approved for NASAL specimens only), is one component of a comprehensive MRSA colonization surveillance program. It is not intended to diagnose MRSA infection nor to guide or monitor treatment for MRSA infections. RESULT CALLED TO, READ BACK BY AND VERIFIED WITH: AMANDA FIELDS AT 1105 01/15/20.PMF Performed at [facility], [address], [place], [place] [address]       Radiology Studies: No results found.   Scheduled Meds: . aspirin EC  81 mg Oral QPM  . atorvastatin  80 mg Oral QHS  . Chlorhexidine Gluconate Cloth  6 each  Topical Q0600  . docusate sodium  100 mg Oral BID  . enoxaparin (LOVENOX) injection  30 mg Subcutaneous Q24H  . feeding supplement (ENSURE ENLIVE)  237 mL Oral BID BM  . insulin aspart  0-15 Units Subcutaneous TID WC  . mupirocin ointment  1 application Nasal BID  . nutrition supplement (JUVEN)  1  packet Oral BID BM  . sodium bicarbonate  1,300 mg Oral TID   Continuous Infusions:    LOS: 5 days     Enzo Bi, MD [facility] If 7PM-7AM, please contact night-coverage 01/18/2020, 6:08 PM

## 2020-01-18 NOTE — Progress Notes (Signed)
PT Cancellation Note  Patient Details Name: HERNAN TURNAGE MRN: [id number] DOB: 03-22-1941   Cancelled Treatment:    Reason Eval/Treat Not Completed: With pt missing AM session secondary to transfusion pt offered 2nd PM PT session which patient declined secondary to "soreness".  Will attempt to see pt at a future date/time as medically appropriate.     Linus Salmons PT, DPT 01/18/20, 4:44 PM

## 2020-01-18 NOTE — TOC Transition Note (Signed)
Transition of Care [facility]) - CM/SW Discharge Note   Patient Details  Name: Bryan Brewer MRN: [id number] Date of Birth: 1941/07/10  Transition of Care [facility]) CM/SW Contact:  Elease Hashimoto, LCSW Phone Number: 01/18/2020, 9:44 AM   Clinical Narrative:   Pt has received rolling walker and 3 in 1 and [facility]. He is going home with his wife and she can assist if needed. No further needs. Ready for DC. Doing better today and moving better.    Final next level of care: Home w Home Health Services Barriers to Discharge: No Barriers Identified   Patient Goals and CMS Choice   CMS Medicare.gov Compare Post Acute Care list provided to:: Patient Choice offered to / list presented to : Patient  Discharge Placement                Patient to be transferred to facility by: Stanton Kidney wife via car Name of family member notified: Mary Patient and family notified of of transfer: 01/18/20  Discharge Plan and Services     Post Acute Care Choice: Home Health          DME Arranged: 3-N-1, Walker rolling DME Agency: [employer] Date DME Agency Contacted: 01/15/20 Time DME Agency Contacted: 1000 Representative spoke with at DME Agency: [place]: PT [place]: [place] Date [place]: 01/15/20 Time [place]: 1000 Representative spoke with at [place]: [employer] ([place]) Interventions     Readmission Risk Interventions Readmission Risk Prevention Plan 01/15/2020  Transportation Screening Complete  Medication Review Press photographer) Complete  PCP or Specialist appointment within 3-5 days of discharge Complete  HRI or [place] Not Applicable  Some recent data might be hidden

## 2020-01-18 NOTE — Care Management Important Message (Signed)
Important Message  Patient Details  Name: Bryan Brewer MRN: [id number] Date of Birth: 07/04/1941   Medicare Important Message Given:  Yes     Elease Hashimoto, LCSW 01/18/2020, 12:26 PM

## 2020-01-18 NOTE — Progress Notes (Signed)
PT Cancellation Note  Patient Details Name: Bryan Brewer MRN: [id number] DOB: 13-Jan-1941   Cancelled Treatment:    Reason Eval/Treat Not Completed: Medical issues which prohibited therapy   Awaiting blood transfusion.  Will continue this pm after completed.    Chesley Noon 01/18/2020, 3:23 PM

## 2020-01-18 NOTE — Progress Notes (Signed)
Physical Therapy Treatment Patient Details Name: Bryan Brewer MRN: [id number] DOB: 07-Oct-1940 Today's Date: 01/18/2020    History of Present Illness 79 y/o male s/p fall getting out of truck and fractured L hip, now s/p ORIF 6/11.    PT Comments    Blood transfusion this am completed.  Ready for session this pm.  Stood and is able to walk to rehab gym with rw and min guard/assist.  Seated rest before stair training and walking 50' back to room.  Fatigued with activity and wheelchair used to get pt fully back to room.    Overall improved mobility this session.  He continues to require +1 hands on assist at all times.  While SNF remains appropriate given gait and balance deficits, he remains firm in his decision to return home with his wife.   Follow Up Recommendations  Home health PT;Supervision for mobility/OOB     Equipment Recommendations  Rolling walker with 5" wheels;3in1 (PT)    Recommendations for Other Services       Precautions / Restrictions Precautions Precautions: Fall Restrictions Weight Bearing Restrictions: Yes LLE Weight Bearing: Weight bearing as tolerated    Mobility  Bed Mobility Overal bed mobility: Needs Assistance             General bed mobility comments: up in chair when arrived  Transfers Overall transfer level: Needs assistance Equipment used: Rolling walker (2 wheeled) Transfers: Sit to/from Stand Sit to Stand: Min assist;Min guard            Ambulation/Gait Ambulation/Gait assistance: Min guard Gait Distance (Feet): 120 Feet Assistive device: Rolling walker (2 wheeled) Gait Pattern/deviations: Step-to pattern;Decreased step length - right;Decreased step length - left;Decreased stance time - left Gait velocity: decreased       Stairs Stairs: Yes Stairs assistance: Min guard;Min assist Stair Management: Two rails;Step to pattern;Forwards Number of Stairs: 4 General stair comments: overall does well.  Stated he holds fence  and porch post at home.  encouraged to have proper rails installed.   Wheelchair Mobility    Modified Rankin (Stroke Patients Only)       Balance Overall balance assessment: Needs assistance Sitting-balance support: Feet supported Sitting balance-Leahy Scale: Good     Standing balance support: Bilateral upper extremity supported;During functional activity Standing balance-Leahy Scale: Fair                              Cognition Arousal/Alertness: Awake/alert Behavior During Therapy: WFL for tasks assessed/performed Overall Cognitive Status: Within Functional Limits for tasks assessed                                        Exercises      General Comments        Pertinent Vitals/Pain Pain Assessment: Faces Faces Pain Scale: Hurts little more Pain Location: L hip and knee (arthritis) Pain Descriptors / Indicators: Sore Pain Intervention(s): Limited activity within patient's tolerance;Monitored during session;Repositioned    Home Living                      Prior Function            PT Goals (current goals can now be found in the care plan section) Progress towards PT goals: Progressing toward goals    Frequency    BID  PT Plan Current plan remains appropriate    Co-evaluation              AM-PAC PT "6 Clicks" Mobility   Outcome Measure  Help needed turning from your back to your side while in a flat bed without using bedrails?: None Help needed moving from lying on your back to sitting on the side of a flat bed without using bedrails?: A Little Help needed moving to and from a bed to a chair (including a wheelchair)?: A Little Help needed standing up from a chair using your arms (e.g., wheelchair or bedside chair)?: A Little Help needed to walk in hospital room?: A Little Help needed climbing 3-5 steps with a railing? : A Little 6 Click Score: 19    End of Session Equipment Utilized During Treatment:  Gait belt Activity Tolerance: Patient tolerated treatment well;Patient limited by fatigue Patient left: with call bell/phone within reach;in chair Nurse Communication: Mobility status PT Visit Diagnosis: Difficulty in walking, not elsewhere classified (R26.2);Muscle weakness (generalized) (M62.81);Pain;Unsteadiness on feet (R26.81) Pain - Right/Left: Left Pain - part of body: Hip     Time: 1450-1515 PT Time Calculation (min) (ACUTE ONLY): 25 min  Charges:  $Gait Training: 23-37 mins                    Chesley Noon, PTA 01/18/20, 3:21 PM

## 2020-01-18 NOTE — Progress Notes (Signed)
Subjective: 4 Days Post-Op Procedure(s) (LRB): INTRAMEDULLARY (IM) NAIL INTERTROCHANTRIC (Left) Patient reports pain as mild.   Patient is well, and has had no acute complaints or problems Plan is to go Home after hospital stay. Negative for chest pain and shortness of breath Fever: no Gastrointestinal: Negative for nausea and vomiting  Objective: Vital signs in last 24 hours: Temp:  [97.9 F (36.6 C)-98.8 F (37.1 C)] 98.2 F (36.8 C) (06/13 2344) Pulse Rate:  [83-97] 83 (06/13 2344) Resp:  [15-18] 18 (06/13 2344) BP: (114-145)/(46-63) 114/46 (06/13 2344) SpO2:  [96 %-99 %] 99 % (06/13 2344)  Intake/Output from previous day:  Intake/Output Summary (Last 24 hours) at 01/18/2020 0749 Last data filed at 01/18/2020 0553 Gross per 24 hour  Intake --  Output 2020 ml  Net -2020 ml    Intake/Output this shift: No intake/output data recorded.  Labs: Recent Labs    01/17/20 0420 01/18/20 0349  HGB 7.3* 6.7*   Recent Labs    01/17/20 0420 01/18/20 0349  WBC 7.5 7.2  RBC 2.67* 2.35*  HCT 23.8* 20.0*  PLT 246 238   Recent Labs    01/17/20 0420 01/18/20 0349  NA 137 137  K 3.9 4.1  CL 106 107  CO2 23 22  BUN 79* 80*  CREATININE 3.28* 3.19*  GLUCOSE 109* 116*  CALCIUM 8.2* 8.1*   No results for input(s): LABPT, INR in the last 72 hours.   EXAM General - Patient is Alert and Oriented Extremity - Neurovascular intact Sensation intact distally Dorsiflexion/Plantar flexion intact Compartment soft Dressing/Incision - clean, dry, moderate serous drainage proximal incision site Motor Function - intact, moving foot and toes well on exam.   Past Medical History:  Diagnosis Date  . (HFpEF) heart failure with preserved ejection fraction ([place])    a. 03/2019 Echo: EF 60-65%, nl RV fxn.  Marland Kitchen AAA (abdominal aortic aneurysm) ([place]) 09/2019   3.0 infrarenal AAA incidentally noted on abdominal/pelvic CT  . Bladder cancer ([place])   . C. difficile colitis 04/2019  . CKD  (chronic kidney disease), stage IV ([place])   . COPD (chronic obstructive pulmonary disease) ([place])   . Coronary artery disease    a. 10/2017 NSTEMI/Cath: LM 5m, 75d, LAD 75ost, 90p, D1 100, LCX 2m/d, OM1 90, RCA 25p; b. 10/2017 CABG x 4 ([facility]): LIMA->LAD, VG->Diag, VG->OM1, VG->OM2.  . Hyperlipidemia   . Hypertension   . NSVT (nonsustained ventricular tachycardia) ([place])    a. 06/2019 [facility]: 2 runs of NSVT up to 12 beats, max rate 187 bpm.  . PSVT (paroxysmal supraventricular tachycardia) ([place])    06/2019 [facility]io: 1820 episodes of SVT lasting up to 00:01:16 w/ max rate of 210.  . Type 2 diabetes mellitus (HCC)     Assessment/Plan: 4 Days Post-Op Procedure(s) (LRB): INTRAMEDULLARY (IM) NAIL INTERTROCHANTRIC (Left) Principal Problem:   Closed left hip fracture, initial encounter ([place]) Active Problems:   Diabetes ([place])   CKD (chronic kidney disease) stage 4, GFR 15-29 ml/min (HCC)   HTN (hypertension)   COPD (chronic obstructive pulmonary disease) (HCC)   Pressure injury of skin   Chronic heart failure with preserved ejection fraction (HFpEF) (HCC)   Coronary artery disease involving native coronary artery of native heart without angina pectoris   BPH (benign prostatic hyperplasia)   Accidental fall   Preoperative clearance   Presence of urostomy (HCC)  Estimated body mass index is 22.55 kg/m as calculated from the following:   Height as of this encounter: 5\' 7"  (1.702 m).  Weight as of this encounter: 65.3 kg. Advance diet Up with therapy D/C IV fluids . Hemoglobin 6.7. Transfuse 1 unit of PRBC today.  Plan to follow-up at [facility] clinic in 2 weeks for staple removal.  DVT Prophylaxis - Lovenox, TED hose and SCDs Weight-Bearing as tolerated to left leg  T. Rachelle Hora, PA-C Orthopaedic Surgery 01/18/2020, 7:49 AM

## 2020-01-19 LAB — BASIC METABOLIC PANEL
Anion gap: 9 (ref 5–15)
BUN: 74 mg/dL — ABNORMAL HIGH (ref 8–23)
CO2: 21 mmol/L — ABNORMAL LOW (ref 22–32)
Calcium: 8.2 mg/dL — ABNORMAL LOW (ref 8.9–10.3)
Chloride: 105 mmol/L (ref 98–111)
Creatinine, Ser: 2.86 mg/dL — ABNORMAL HIGH (ref 0.61–1.24)
GFR calc Af Amer: 23 mL/min — ABNORMAL LOW (ref >60)
GFR calc non Af Amer: 20 mL/min — ABNORMAL LOW (ref >60)
Glucose, Bld: 125 mg/dL — ABNORMAL HIGH (ref 70–99)
Potassium: 4.3 mmol/L (ref 3.5–5.1)
Sodium: 135 mmol/L (ref 135–145)

## 2020-01-19 LAB — CBC
HCT: 24.2 % — ABNORMAL LOW (ref 39.0–52.0)
Hemoglobin: 8.2 g/dL — ABNORMAL LOW (ref 13.0–17.0)
MCH: 28.5 pg (ref 26.0–34.0)
MCHC: 33.9 g/dL (ref 30.0–36.0)
MCV: 84 fL (ref 80.0–100.0)
Platelets: 258 10*3/uL (ref 150–400)
RBC: 2.88 MIL/uL — ABNORMAL LOW (ref 4.22–5.81)
RDW: 14.5 % (ref 11.5–15.5)
WBC: 8.6 10*3/uL (ref 4.0–10.5)
nRBC: 0 % (ref 0.0–0.2)

## 2020-01-19 LAB — MAGNESIUM: Magnesium: 1.7 mg/dL (ref 1.7–2.4)

## 2020-01-19 LAB — TYPE AND SCREEN
ABO/RH(D): A POS
Antibody Screen: NEGATIVE
Unit division: 0

## 2020-01-19 LAB — BPAM RBC
Blood Product Expiration Date: 202107072359
ISSUE DATE / TIME: 202106141108
Unit Type and Rh: 6200

## 2020-01-19 LAB — GLUCOSE, CAPILLARY
Glucose-Capillary: 147 mg/dL — ABNORMAL HIGH (ref 70–99)
Glucose-Capillary: 110 mg/dL — ABNORMAL HIGH (ref 70–99)
Glucose-Capillary: 143 mg/dL — ABNORMAL HIGH (ref 70–99)
Glucose-Capillary: 144 mg/dL — ABNORMAL HIGH (ref 70–99)

## 2020-01-19 MED ORDER — FERROUS SULFATE 325 (65 FE) MG PO TBEC
325.0000 mg | DELAYED_RELEASE_TABLET | Freq: Two times a day (BID) | ORAL | 3 refills | Status: DC
Start: 2020-01-19 — End: 2020-02-05

## 2020-01-19 MED ORDER — DARBEPOETIN ALFA 40 MCG/0.4ML IJ SOSY: 25.0000 ug | PREFILLED_SYRINGE | INTRAMUSCULAR | 0 refills | Status: DC

## 2020-01-19 MED ORDER — SODIUM CHLORIDE 0.9 % IV SOLN
510.0000 mg | Freq: Once | INTRAVENOUS | Status: AC
  Administered 2020-01-19: 510 mg via INTRAVENOUS
  Filled 2020-01-19: qty 17

## 2020-01-19 MED ORDER — DARBEPOETIN ALFA 40 MCG/0.4ML IJ SOSY
25.0000 ug | PREFILLED_SYRINGE | INTRAMUSCULAR | Status: DC
  Administered 2020-01-19: 25 ug via SUBCUTANEOUS
  Filled 2020-01-19: qty 0.4

## 2020-01-19 NOTE — Plan of Care (Signed)

## 2020-01-19 NOTE — Progress Notes (Signed)
Discharge summary reviewed, answered all questions. Dressing change to left hip per order, DME given. Escorted to personal vehicle via wc.

## 2020-01-19 NOTE — Progress Notes (Signed)
Shift Summary:  Patient had no acute vents overnight.  Vitals signa have been stable. Patient remains on room air. Patient c/o pain and medicated per MAR.  Patient slept in chair with heels elevated as he states that it is more comfortable for him to sleep in a chair rather than a bed since his urostomy was placed.  Urostomy device clean dry and intact with an adequate amount of urinary out put.  Dressing(s) to left hip are dry and intact and remain in place. Will continue to monitor patient.

## 2020-01-19 NOTE — Progress Notes (Signed)
Physical Therapy Treatment Patient Details Name: Bryan Brewer MRN: [id number] DOB: 11/15/1940 Today's Date: 01/19/2020    History of Present Illness 79 y/o male s/p fall getting out of truck and fractured L hip, now s/p ORIF 6/11.    PT Comments    Pt pleasant and motivated to participate during the session.  Pt did not require physical assistance during the session but did require cues for sequencing with transfers, gait, and stair training.  Pt ambulated with a slow, mildly antalgic cadence but gait pattern progressed from step-to pattern to beginning step-through pattern but with decreased LLE stance time.  Pt steady with gait and stair training with no LOB and reported no adverse symptoms during the session other than LLE pain.  Pt will benefit from HHPT services upon discharge to safely address deficits listed in patient problem list for decreased caregiver assistance and eventual return to PLOF.     Follow Up Recommendations  Home health PT;Supervision for mobility/OOB     Equipment Recommendations  Rolling walker with 5" wheels;3in1 (PT)    Recommendations for Other Services       Precautions / Restrictions Precautions Precautions: Fall Restrictions Weight Bearing Restrictions: Yes LLE Weight Bearing: Weight bearing as tolerated    Mobility  Bed Mobility               General bed mobility comments: NT, in recliner  Transfers Overall transfer level: Needs assistance Equipment used: Rolling walker (2 wheeled) Transfers: Sit to/from Stand Sit to Stand: Min guard         General transfer comment: Fair eccentric and concentric control  Ambulation/Gait Ambulation/Gait assistance: Min guard Gait Distance (Feet): 120 Feet x 2 Assistive device: Rolling walker (2 wheeled) Gait Pattern/deviations: Step-to pattern;Decreased step length - right;Decreased step length - left;Decreased stance time - left;Step-through pattern;Trunk flexed Gait velocity: decreased    General Gait Details: Mod to max verbal cues for upright posture and amb closer to the RW, slow cadence slowly progressed to beginning step-through pattern   Stairs Stairs: Yes Stairs assistance: Min guard Stair Management: Two rails;Step to pattern;Forwards Number of Stairs: 4 General stair comments: Min verbal cues for sequencing with fair eccentric and concentric control   Wheelchair Mobility    Modified Rankin (Stroke Patients Only)       Balance Overall balance assessment: Needs assistance Sitting-balance support: Feet supported Sitting balance-Leahy Scale: Good     Standing balance support: Bilateral upper extremity supported;During functional activity Standing balance-Leahy Scale: Fair                              Cognition Arousal/Alertness: Awake/alert Behavior During Therapy: WFL for tasks assessed/performed Overall Cognitive Status: Within Functional Limits for tasks assessed                                        Exercises Other Exercises Other Exercises: Sit to/from stand transfer training from various height surfaces with min verbal cues for sequencing    General Comments        Pertinent Vitals/Pain Pain Assessment: 0-10 Pain Score: 2  Pain Location: L hip and knee Pain Descriptors / Indicators: Sore Pain Intervention(s): Premedicated before session;Monitored during session    Home Living  Prior Function            PT Goals (current goals can now be found in the care plan section) Progress towards PT goals: Progressing toward goals    Frequency    BID      PT Plan Current plan remains appropriate    Co-evaluation              AM-PAC PT "6 Clicks" Mobility   Outcome Measure  Help needed turning from your back to your side while in a flat bed without using bedrails?: None Help needed moving from lying on your back to sitting on the side of a flat bed without using  bedrails?: A Little Help needed moving to and from a bed to a chair (including a wheelchair)?: A Little Help needed standing up from a chair using your arms (e.g., wheelchair or bedside chair)?: A Little Help needed to walk in hospital room?: A Little Help needed climbing 3-5 steps with a railing? : A Little 6 Click Score: 19    End of Session Equipment Utilized During Treatment: Gait belt Activity Tolerance: Patient tolerated treatment well Patient left: in chair;with call bell/phone within reach Nurse Communication: Mobility status;Other (comment) (No chair alarm found on pt's chair) PT Visit Diagnosis: Difficulty in walking, not elsewhere classified (R26.2);Muscle weakness (generalized) (M62.81);Pain;Unsteadiness on feet (R26.81) Pain - Right/Left: Left Pain - part of body: Hip     Time: 0933-1000 PT Time Calculation (min) (ACUTE ONLY): 27 min  Charges:  $Gait Training: 23-37 mins                     D. Scott Alazar Cherian PT, DPT 01/19/20, 12:00 PM

## 2020-01-19 NOTE — Progress Notes (Signed)
Subjective: 5 Days Post-Op Procedure(s) (LRB): INTRAMEDULLARY (IM) NAIL INTERTROCHANTRIC (Left) Patient reports pain as mild.   Patient is well, and has had no acute complaints or problems Plan is to go Home after hospital stay. Negative for chest pain and shortness of breath Fever: no Gastrointestinal: Negative for nausea and vomiting  Objective: Vital signs in last 24 hours: Temp:  [97.6 F (36.4 C)-99 F (37.2 C)] 97.6 F (36.4 C) (06/15 0733) Pulse Rate:  [65-96] 65 (06/15 0733) Resp:  [15-18] 15 (06/14 2331) BP: (122-145)/(55-67) 129/59 (06/15 0733) SpO2:  [95 %-100 %] 100 % (06/15 0733)  Intake/Output from previous day:  Intake/Output Summary (Last 24 hours) at 01/19/2020 0803 Last data filed at 01/19/2020 0452 Gross per 24 hour  Intake --  Output 1750 ml  Net -1750 ml    Intake/Output this shift: No intake/output data recorded.  Labs: Recent Labs    01/17/20 0420 01/18/20 0349 01/18/20 1518 01/19/20 0525  HGB 7.3* 6.7* 9.1* 8.2*   Recent Labs    01/18/20 0349 01/18/20 0349 01/18/20 1518 01/19/20 0525  WBC 7.2  --   --  8.6  RBC 2.35*  --   --  2.88*  HCT 20.0*   < > 29.1* 24.2*  PLT 238  --   --  258   < > = values in this interval not displayed.   Recent Labs    01/18/20 0349 01/19/20 0525  NA 137 135  K 4.1 4.3  CL 107 105  CO2 22 21*  BUN 80* 74*  CREATININE 3.19* 2.86*  GLUCOSE 116* 125*  CALCIUM 8.1* 8.2*   No results for input(s): LABPT, INR in the last 72 hours.   EXAM General - Patient is Alert and Oriented Extremity - Neurovascular intact Sensation intact distally Dorsiflexion/Plantar flexion intact Compartment soft Dressing/Incision - clean, dry, moderate serous drainage proximal incision site Motor Function - intact, moving foot and toes well on exam.   Past Medical History:  Diagnosis Date   (HFpEF) heart failure with preserved ejection fraction ([place])    a. 03/2019 Echo: EF 60-65%, nl RV fxn.   AAA (abdominal aortic  aneurysm) ([place]) 09/2019   3.0 infrarenal AAA incidentally noted on abdominal/pelvic CT   Bladder cancer (HCC)    C. difficile colitis 04/2019   CKD (chronic kidney disease), stage IV (HCC)    COPD (chronic obstructive pulmonary disease) (HCC)    Coronary artery disease    a. 10/2017 NSTEMI/Cath: LM 55m, 75d, LAD 75ost, 90p, D1 100, LCX 61m/d, OM1 90, RCA 25p; b. 10/2017 CABG x 4 ([facility]): LIMA->LAD, VG->Diag, VG->OM1, VG->OM2.   Hyperlipidemia    Hypertension    NSVT (nonsustained ventricular tachycardia) ([place])    a. 06/2019 [facility]: 2 runs of NSVT up to 12 beats, max rate 187 bpm.   PSVT (paroxysmal supraventricular tachycardia) ([place])    06/2019 [facility]: 1820 episodes of SVT lasting up to 00:01:16 w/ max rate of 210.   Type 2 diabetes mellitus (HCC)     Assessment/Plan: 5 Days Post-Op Procedure(s) (LRB): INTRAMEDULLARY (IM) NAIL INTERTROCHANTRIC (Left) Principal Problem:   Closed left hip fracture, initial encounter ([place]) Active Problems:   Diabetes ([place])   CKD (chronic kidney disease) stage 4, GFR 15-29 ml/min (HCC)   HTN (hypertension)   COPD (chronic obstructive pulmonary disease) (HCC)   Pressure injury of skin   Chronic heart failure with preserved ejection fraction (HFpEF) (HCC)   Coronary artery disease involving native coronary artery of native heart without angina pectoris  BPH (benign prostatic hyperplasia)   Accidental fall   Preoperative clearance   Presence of urostomy (HCC)  Estimated body mass index is 22.55 kg/m as calculated from the following:   Height as of this encounter: 5\' 7"  (1.702 m).   Weight as of this encounter: 65.3 kg. Advance diet Up with therapy D/C IV fluids . Hemoglobin 8.2. s/p 1 unit of PRBC 01/18/2020.  Plan to follow-up at [facility] in 2 weeks for staple removal.  DVT Prophylaxis - Lovenox, TED hose and SCDs Weight-Bearing as tolerated to left leg  T. Rachelle Hora, PA-C Orthopaedic Surgery 01/19/2020, 8:03 AM

## 2020-01-19 NOTE — Discharge Summary (Signed)
Physician Discharge Summary  Bryan Brewer BVQ:[id number] DOB: 11/23/1940 DOA: 01/13/2020  PCP: Tracie Harrier, MD  Admit date: 01/13/2020 Discharge date: 01/19/2020  Admitted From: Home Disposition: Home  Recommendations for Outpatient Follow-up:  1. Follow up with PCP in 1-2 weeks 2. Follow-up with orthopedic 3. Follow-up with nephrology 4. Please obtain BMP/CBC in one week 5. Please follow up on the following pending results: None  Home Health: Yes Equipment/Devices: Rolling walker, 3 in 1 Discharge Condition: Stable CODE STATUS: Full Diet recommendation: Heart Healthy / Carb Modified   Brief/Interim Summary: Bryan A Dodsonis a 79 y.o.Caucasian malewith medical history significant forCAD, HFpEF, CKD 4, COPD,bladder cancer status post cystoprostatectomy with urostomy and ileal conduit,CADs/pCABG followed by cardiologist, Dr. Saunders Revel, last seen on 12/16/2019, who presents to the emergency room after suffering an accidental fall while getting out of his truck. He fell onto his left hip also with impact to the left shoulder and elbow.  He sustained a left hip fracture which was treated with intramedullary nail placement by orthopedic.  Patient tolerated the procedure well.  He will follow-up with orthopedic in 2 weeks.  Physical therapy recommended home health services which were ordered.  He will be weight bearing as tolerated to left leg.  Patient developed worsening anemia which seems multifactorial.  Iron deficiency with anemia of chronic disease secondary to renal dysfunction.  He did received 1 unit of PRBC.  1 dose of IV iron and was discharged on iron supplement along with Aranesp.  He will follow-up with nephrology for further management.  Patient has an history of stage IV renal disease with GFR of 15-29.  Creatinine remained stable.  He did develop hyperkalemia which was treated with Lokelma and Kayexalate.  Calcium was within normal limit on discharge.  He will continue with  home dose of sodium bicarbonate and will follow up with nephrology.  Patient will continue with rest of his home meds and will follow up with his primary care physician and cardiology.  Discharge Diagnoses:  Principal Problem:   Closed left hip fracture, initial encounter [facility]) Active Problems:   Diabetes ([place])   CKD (chronic kidney disease) stage 4, GFR 15-29 ml/min (HCC)   HTN (hypertension)   COPD (chronic obstructive pulmonary disease) (HCC)   Pressure injury of skin   Chronic heart failure with preserved ejection fraction (HFpEF) (HCC)   Coronary artery disease involving native coronary artery of native heart without angina pectoris   BPH (benign prostatic hyperplasia)   Accidental fall   Preoperative clearance   Presence of urostomy [facility])   Discharge Instructions  Discharge Instructions    Diet - low sodium heart healthy   Complete by: As directed    Discharge instructions   Complete by: As directed    Please follow-up with orthopedics at [facility] in 2 weeks for staple removal.  Because your blood pressure was low normal without any blood pressure medications in the hospital, I have held your Lasix.  Please follow up with your primary care doctor about if/when to resume it.   Dr. Enzo Bi - -   Discharge instructions   Complete by: As directed    It was pleasure taking care of you. We are starting you on iron supplement and Aranesp as your anemia looks like multifactorial secondary to your kidney disease and some iron deficiency.  Please follow-up with nephrology to discuss the need for continuation of Aranesp. Follow-up with orthopedic surgery and your primary care physician.   Increase activity slowly  Complete by: As directed    Increase activity slowly   Complete by: As directed    Leave dressing on - Keep it clean, dry, and intact until clinic visit   Complete by: As directed    No wound care   Complete by: As directed      Allergies as of  01/19/2020   No Known Allergies     Medication List    STOP taking these medications   Dulera 200-5 MCG/ACT Aero Generic drug: mometasone-formoterol     TAKE these medications   acetaminophen 500 MG tablet Commonly known as: TYLENOL Take 2 tablets (1,000 mg total) by mouth 3 (three) times daily as needed for mild pain or moderate pain (pain score 1-3 or temp > 100.5).   aspirin EC 81 MG tablet Take 81 mg by mouth every evening.   atorvastatin 80 MG tablet Commonly known as: LIPITOR Take 80 mg by mouth at bedtime.   blood glucose meter kit and supplies Kit Dispense based on patient and insurance preference. Use up to four times daily as directed. (FOR ICD-9 250.00, 250.01).   Darbepoetin Alfa 40 MCG/0.4ML Sosy injection Commonly known as: ARANESP Inject 0.25 mLs (25 mcg total) into the skin every 7 (seven) days. Start taking on: January 26, 2020   enoxaparin 40 MG/0.4ML injection Commonly known as: LOVENOX Inject 0.4 mLs (40 mg total) into the skin daily for 14 doses.   ferrous sulfate 325 (65 FE) MG EC tablet Take 1 tablet (325 mg total) by mouth 2 (two) times daily.   furosemide 20 MG tablet Commonly known as: LASIX Hold until outpatient doctor followup due to low normal BP. What changed:   how much to take  how to take this  when to take this  additional instructions   glipiZIDE 5 MG tablet Commonly known as: GLUCOTROL Take 0.5 tablets (2.5 mg total) by mouth daily before breakfast.   ipratropium-albuterol 0.5-2.5 (3) MG/3ML Soln Commonly known as: DUONEB Take 3 mLs by nebulization every 6 (six) hours as needed (wheezing, shortness of breath).   metoprolol tartrate 25 MG tablet Commonly known as: LOPRESSOR Take 1 tablet (25 mg total) by mouth 2 (two) times daily.   pantoprazole 40 MG tablet Commonly known as: Protonix Take 1 tablet (40 mg total) by mouth daily.   sodium bicarbonate 650 MG tablet Take 1,300 mg by mouth 3 (three) times daily.    traMADol 50 MG tablet Commonly known as: ULTRAM Take 1 tablet (50 mg total) by mouth every 6 (six) hours as needed for moderate pain.            Durable Medical Equipment  (From admission, onward)         Start     Ordered   01/16/20 0930  For home use only DME Bedside commode  Once       Question:  Patient needs a bedside commode to treat with the following condition  Answer:  Hip fracture ([place])   01/16/20 0929   01/16/20 0929  For home use only DME Walker rolling  Once       Question Answer Comment  Walker: With [employer]   Patient needs a walker to treat with the following condition Hip fracture ([place])      01/16/20 0928           Discharge Care Instructions  (From admission, onward)         Start     Ordered   01/19/20  0000  Leave dressing on - Keep it clean, dry, and intact until clinic visit        01/19/20 [place]          Follow-up Information    Lattie Corns, PA-C. Schedule an appointment as soon as possible for a visit in 2 week(s).   Specialty: Physician Assistant Why: For staple removal Contact information: [place] [address] [phone]        Tracie Harrier, MD. Schedule an appointment as soon as possible for a visit in 1 week(s).   Specialty: Internal Medicine Contact information: [address] [place] [address] [phone]        Nelva Bush, MD .   Specialty: Cardiology Contact information: [address] [place] [address] [phone]        Murlean Iba, MD. Schedule an appointment as soon as possible for a visit.   Specialty: Nephrology Contact information: [place] [address] [phone]              No Known Allergies  Consultations:  Orthopedic surgery  Procedures/Studies: DG Elbow Complete Left  Result Date: 01/13/2020 CLINICAL DATA:  Status post EXAM:  LEFT ELBOW - COMPLETE 3+ VIEW COMPARISON:  None. FINDINGS: There is no evidence of fracture, dislocation, or joint effusion. There is no evidence of arthropathy or other focal bone abnormality. Soft tissues are unremarkable. IMPRESSION: Negative. Electronically Signed   By: Constance Holster M.D.   On: 01/13/2020 22:17   CT Head Wo Contrast  Result Date: 01/13/2020 CLINICAL DATA:  Head trauma, headache, fall exiting tract EXAM: CT HEAD WITHOUT CONTRAST CT CERVICAL SPINE WITHOUT CONTRAST TECHNIQUE: Multidetector CT imaging of the head and cervical spine was performed following the standard protocol without intravenous contrast. Multiplanar CT image reconstructions of the cervical spine were also generated. COMPARISON:  PET-CT 10/15/2019 FINDINGS: CT HEAD FINDINGS Brain: No evidence of acute infarction, hemorrhage, hydrocephalus, extra-axial collection or mass lesion/mass effect. Symmetric prominence of the ventricles, cisterns and sulci compatible with parenchymal volume loss. Patchy areas of white matter hypoattenuation are most compatible with chronic microvascular angiopathy. Vascular: Atherosclerotic calcification of the carotid siphons and intradural vertebral arteries. No hyperdense vessel. Skull: No calvarial fracture or suspicious osseous lesion. No scalp swelling or hematoma. Sinuses/Orbits: Paranasal sinuses and mastoid air cells are predominantly clear. Orbital structures are unremarkable aside from prior lens extractions. Other: Rightward nasal septal deviation with a contacting right-sided nasal septal spur. Minimal nonspecific cutaneous thickening of the right malar soft tissues, correlate with visual inspection. CT CERVICAL SPINE FINDINGS Alignment: Stabilization collar absent at the time of examination. There is mild rightward cranial rotation. No evidence of traumatic listhesis. No abnormally widened, perched or jumped facets. Normal alignment of the craniocervical and atlantoaxial articulations  accounting for the degree of rotation. Skull base and vertebrae: No skull base or vertebral body fractures. No vertebral body height loss. No worrisome osseous lesions. Soft tissues and spinal canal: No pre or paravertebral fluid or swelling. No visible canal hematoma. Disc levels: Multilevel intervertebral disc height loss with spondylitic endplate changes. Large disc osteophyte complex at C6-7 results in mild canal stenosis. Smaller complex at C5-6 effaces the ventral thecal sac without significant canal stenosis. Multilevel uncinate spurring and facet hypertrophic changes are also maximal at these levels with at most mild to moderate foraminal narrowing. Upper chest: Extensive centrilobular and paraseptal emphysematous changes in the lung apices. No consolidation or  acute cardiopulmonary abnormality. Additional biapical pleuroparenchymal scarring. Other: Extensive vascular calcium throughout the proximal great vessels and cervical carotids. Normal thyroid. IMPRESSION: 1. No evidence of acute intracranial abnormality. No significant scalp swelling or calvarial fracture. 2. Chronic microvascular angiopathy and parenchymal volume loss. 3. No evidence of acute fracture or traumatic listhesis of the cervical spine. 4. Multilevel degenerative disc disease and facet hypertrophic changes of the cervical spine, most prominent at C6-7 where there is mild canal stenosis. 5. Extensive vascular calcium throughout the proximal great vessels and cervical carotids. 6. Focal nonspecific skin thickening of the right malar soft tissues, correlate with visual inspection. 7. Emphysema (ICD10-J43.9). Electronically Signed   By: Lovena Le M.D.   On: 01/13/2020 22:43   CT Cervical Spine Wo Contrast  Result Date: 01/13/2020 CLINICAL DATA:  Head trauma, headache, fall exiting tract EXAM: CT HEAD WITHOUT CONTRAST CT CERVICAL SPINE WITHOUT CONTRAST TECHNIQUE: Multidetector CT imaging of the head and cervical spine was performed  following the standard protocol without intravenous contrast. Multiplanar CT image reconstructions of the cervical spine were also generated. COMPARISON:  PET-CT 10/15/2019 FINDINGS: CT HEAD FINDINGS Brain: No evidence of acute infarction, hemorrhage, hydrocephalus, extra-axial collection or mass lesion/mass effect. Symmetric prominence of the ventricles, cisterns and sulci compatible with parenchymal volume loss. Patchy areas of white matter hypoattenuation are most compatible with chronic microvascular angiopathy. Vascular: Atherosclerotic calcification of the carotid siphons and intradural vertebral arteries. No hyperdense vessel. Skull: No calvarial fracture or suspicious osseous lesion. No scalp swelling or hematoma. Sinuses/Orbits: Paranasal sinuses and mastoid air cells are predominantly clear. Orbital structures are unremarkable aside from prior lens extractions. Other: Rightward nasal septal deviation with a contacting right-sided nasal septal spur. Minimal nonspecific cutaneous thickening of the right malar soft tissues, correlate with visual inspection. CT CERVICAL SPINE FINDINGS Alignment: Stabilization collar absent at the time of examination. There is mild rightward cranial rotation. No evidence of traumatic listhesis. No abnormally widened, perched or jumped facets. Normal alignment of the craniocervical and atlantoaxial articulations accounting for the degree of rotation. Skull base and vertebrae: No skull base or vertebral body fractures. No vertebral body height loss. No worrisome osseous lesions. Soft tissues and spinal canal: No pre or paravertebral fluid or swelling. No visible canal hematoma. Disc levels: Multilevel intervertebral disc height loss with spondylitic endplate changes. Large disc osteophyte complex at C6-7 results in mild canal stenosis. Smaller complex at C5-6 effaces the ventral thecal sac without significant canal stenosis. Multilevel uncinate spurring and facet hypertrophic  changes are also maximal at these levels with at most mild to moderate foraminal narrowing. Upper chest: Extensive centrilobular and paraseptal emphysematous changes in the lung apices. No consolidation or acute cardiopulmonary abnormality. Additional biapical pleuroparenchymal scarring. Other: Extensive vascular calcium throughout the proximal great vessels and cervical carotids. Normal thyroid. IMPRESSION: 1. No evidence of acute intracranial abnormality. No significant scalp swelling or calvarial fracture. 2. Chronic microvascular angiopathy and parenchymal volume loss. 3. No evidence of acute fracture or traumatic listhesis of the cervical spine. 4. Multilevel degenerative disc disease and facet hypertrophic changes of the cervical spine, most prominent at C6-7 where there is mild canal stenosis. 5. Extensive vascular calcium throughout the proximal great vessels and cervical carotids. 6. Focal nonspecific skin thickening of the right malar soft tissues, correlate with visual inspection. 7. Emphysema (ICD10-J43.9). Electronically Signed   By: Lovena Le M.D.   On: 01/13/2020 22:43   DG Chest Portable 1 View  Result Date: 01/13/2020 CLINICAL DATA:  Pain EXAM: PORTABLE CHEST  1 VIEW COMPARISON:  04/29/2019 FINDINGS: The heart size is stable but enlarged. The patient is status post prior median sternotomy. There are aortic calcifications. There is mild vascular congestion without overt pulmonary edema. There is no pneumothorax. No significant pleural effusion. IMPRESSION: Cardiomegaly with mild vascular congestion. No overt pulmonary edema or significant pleural effusion. Electronically Signed   By: Constance Holster M.D.   On: 01/13/2020 22:17   DG Shoulder Left  Result Date: 01/13/2020 CLINICAL DATA:  Pain status post fall EXAM: LEFT SHOULDER - 2+ VIEW COMPARISON:  None. FINDINGS: There is no evidence of fracture or dislocation. There is no evidence of arthropathy or other focal bone abnormality. Soft  tissues are unremarkable. IMPRESSION: Negative. Electronically Signed   By: Constance Holster M.D.   On: 01/13/2020 22:18   DG HIP OPERATIVE UNILAT WITH PELVIS LEFT  Result Date: 01/14/2020 CLINICAL DATA:  Left hip ORIF EXAM: OPERATIVE LEFT HIP (WITH PELVIS IF PERFORMED) 2 VIEWS TECHNIQUE: Fluoroscopic spot image(s) were submitted for interpretation post-operatively. COMPARISON:  01/13/2020 FINDINGS: 4 C-arm fluoroscopic images were obtained intraoperatively and submitted for post operative interpretation. Interval placement of anterograde long intramedullary nail with proximal lag screw and distal interlocking screw traversing intertrochanteric fracture the proximal left femur. Improved osseous alignment. Please see the performing provider's procedural report for further detail. IMPRESSION: As above. Electronically Signed   By: Davina Poke D.O.   On: 01/14/2020 18:02   DG Hip Unilat W or Wo Pelvis 2-3 Views Left  Result Date: 01/13/2020 CLINICAL DATA:  Pain EXAM: DG HIP (WITH OR WITHOUT PELVIS) 2-3V LEFT COMPARISON:  None. FINDINGS: There is an acute displaced, comminuted intratrochanteric fracture of the proximal left femur. There is no dislocation. There are moderate degenerative changes of the left hip. There are mild degenerative changes of the right hip. IMPRESSION: Acute displaced, comminuted intratrochanteric fracture of the proximal left femur. Electronically Signed   By: Constance Holster M.D.   On: 01/13/2020 22:16     Subjective: Patient was feeling better when seen today.  He was sitting in the chair.  Pain was well controlled.  He wants to go home.  Discharge Exam: Vitals:   01/18/20 2331 01/19/20 0733  BP: (!) 122/58 (!) 129/59  Pulse: 75 65  Resp: 15 18  Temp: 98.9 F (37.2 C) 97.6 F (36.4 C)  SpO2: 98% 100%   Vitals:   01/18/20 1425 01/18/20 1650 01/18/20 2331 01/19/20 0733  BP: (!) 145/63 (!) 141/58 (!) 122/58 (!) 129/59  Pulse: 79 96 75 65  Resp: _0 Temp: 98 F (36.7 C) 99 F (37.2 C) 98.9 F (37.2 C) 97.6 F (36.4 C)  TempSrc: Oral Oral Oral Oral  SpO2: 100% 95% 98% 100%  Weight:      Height:        General: Pt is alert, awake, not in acute distress Cardiovascular: RRR, S1/S2 +, no rubs, no gallops Respiratory: CTA bilaterally, no wheezing, no rhonchi Abdominal: Soft, NT, ND, bowel sounds + Extremities: no edema, no cyanosis   The results of significant diagnostics from this hospitalization (including imaging, microbiology, ancillary and laboratory) are listed below for reference.    Microbiology: Recent Results (from the past 240 hour(s))  SARS Coronavirus 2 by RT PCR (hospital order, performed in [facility] hospital lab) Nasopharyngeal Nasopharyngeal Swab     Status: None   Collection Time: 01/13/20 10:33 PM   Specimen: Nasopharyngeal Swab  Result Value Ref Range Status   SARS Coronavirus 2  NEGATIVE NEGATIVE Final    Comment: (NOTE) SARS-CoV-2 target nucleic acids are NOT DETECTED. The SARS-CoV-2 RNA is generally detectable in upper and lower respiratory specimens during the acute phase of infection. The lowest concentration of SARS-CoV-2 viral copies this assay can detect is 250 copies / mL. A negative result does not preclude SARS-CoV-2 infection and should not be used as the sole basis for treatment or other patient management decisions.  A negative result may occur with improper specimen collection / handling, submission of specimen other than nasopharyngeal swab, presence of viral mutation(s) within the areas targeted by this assay, and inadequate number of viral copies (<250 copies / mL). A negative result must be combined with clinical observations, patient history, and epidemiological information. Fact Sheet for Patients:   StrictlyIdeas.no Fact Sheet for Healthcare Providers: BankingDealers.co.za This test is not yet approved or cleared  by the [place]  FDA and has been authorized for detection and/or diagnosis of SARS-CoV-2 by FDA under an Emergency Use Authorization (EUA).  This EUA will remain in effect (meaning this test can be used) for the duration of the COVID-19 declaration under Section 564(b)(1) of the Act, 21 U.S.C. section 360bbb-3(b)(1), unless the authorization is terminated or revoked sooner. Performed at [facility], [address] [place], [place] [address]   MRSA PCR Screening     Status: Abnormal   Collection Time: 01/15/20  9:44 AM   Specimen: Nasopharyngeal  Result Value Ref Range Status   MRSA by PCR POSITIVE (A) NEGATIVE Final    Comment:        The GeneXpert MRSA Assay (FDA approved for NASAL specimens only), is one component of a comprehensive MRSA colonization surveillance program. It is not intended to diagnose MRSA infection nor to guide or monitor treatment for MRSA infections. RESULT CALLED TO, READ BACK BY AND VERIFIED WITH: AMANDA FIELDS AT 1105 01/15/20.PMF Performed at [facility], [address] [place], [place] [address]      Labs: BNP (last 3 results) Recent Labs    03/31/19 0933 04/01/19 0357  BNP 646.0* 973.5*   Basic Metabolic Panel: Recent Labs  Lab 01/15/20 0633 01/15/20 3299 01/16/20 0449 01/16/20 1703 01/17/20 0420 01/18/20 0349 01/19/20 0525  NA 134*  --  133*  --  137 137 135  K 5.2*   < > 5.6* 4.9 3.9 4.1 4.3  CL 111  --  107  --  106 107 105  CO2 14*  --  17*  --  23 22 21*  GLUCOSE 115*  --  101*  --  109* 116* 125*  BUN 60*  --  72*  --  79* 80* 74*  CREATININE 3.58*  --  3.40*  --  3.28* 3.19* 2.86*  CALCIUM 8.0*  --  8.4*  --  8.2* 8.1* 8.2*  MG 1.7  --  1.8  --  1.8 1.7 1.7   < > = values in this interval not displayed.   Liver Function Tests: Recent Labs  Lab 01/13/20 2134  AST 10*  ALT 10  ALKPHOS 80  BILITOT 0.7  PROT 7.8  ALBUMIN 3.0*   No results for input(s): LIPASE, AMYLASE in the last 168 hours. No results for  input(s): AMMONIA in the last 168 hours. CBC: Recent Labs  Lab 01/13/20 2134 01/13/20 2134 01/15/20 0633 01/17/20 0420 01/18/20 0349 01/18/20 1518 01/19/20 0525  WBC 10.5  --  9.0 7.5 7.2  --  8.6  NEUTROABS 9.5*  --   --   --   --   --   --  HGB 10.2*   < > 8.0* 7.3* 6.7* 9.1* 8.2*  HCT 32.9*   < > 25.5* 23.8* 20.0* 29.1* 24.2*  MCV 89.4  --  88.2 89.1 85.1  --  84.0  PLT 313  --  219 246 238  --  258   < > = values in this interval not displayed.   Cardiac Enzymes: No results for input(s): CKTOTAL, CKMB, CKMBINDEX, TROPONINI in the last 168 hours. BNP: Invalid input(s): POCBNP CBG: Recent Labs  Lab 01/18/20 1138 01/18/20 1648 01/18/20 2142 01/19/20 0738 01/19/20 1132  GLUCAP 136* 160* 147* 110* 143*   D-Dimer No results for input(s): DDIMER in the last 72 hours. Hgb A1c No results for input(s): HGBA1C in the last 72 hours. Lipid Profile No results for input(s): CHOL, HDL, LDLCALC, TRIG, CHOLHDL, LDLDIRECT in the last 72 hours. Thyroid function studies No results for input(s): TSH, T4TOTAL, T3FREE, THYROIDAB in the last 72 hours.  Invalid input(s): FREET3 Anemia work up Recent Labs    01/17/20 1613  VITAMINB12 1,576*  FOLATE 6.9  TIBC 137*  IRON 16*   Urinalysis    Component Value Date/Time   COLORURINE YELLOW (A) 04/28/2019 1002   APPEARANCEUR CLEAR (A) 04/28/2019 1002   LABSPEC 1.008 04/28/2019 1002   PHURINE 6.0 04/28/2019 1002   [place] 04/28/2019 1002   [place] 04/28/2019 1002   [place] 04/28/2019 1002   [place] 04/28/2019 1002   PROTEINUR NEGATIVE 04/28/2019 1002   NITRITE NEGATIVE 04/28/2019 1002   LEUKOCYTESUR NEGATIVE 04/28/2019 1002   Sepsis Labs Invalid input(s): PROCALCITONIN,  WBC,  LACTICIDVEN Microbiology Recent Results (from the past 240 hour(s))  SARS Coronavirus 2 by RT PCR (hospital order, performed in [facility] hospital lab) Nasopharyngeal Nasopharyngeal Swab     Status: None    Collection Time: 01/13/20 10:33 PM   Specimen: Nasopharyngeal Swab  Result Value Ref Range Status   SARS Coronavirus 2 NEGATIVE NEGATIVE Final    Comment: (NOTE) SARS-CoV-2 target nucleic acids are NOT DETECTED. The SARS-CoV-2 RNA is generally detectable in upper and lower respiratory specimens during the acute phase of infection. The lowest concentration of SARS-CoV-2 viral copies this assay can detect is 250 copies / mL. A negative result does not preclude SARS-CoV-2 infection and should not be used as the sole basis for treatment or other patient management decisions.  A negative result may occur with improper specimen collection / handling, submission of specimen other than nasopharyngeal swab, presence of viral mutation(s) within the areas targeted by this assay, and inadequate number of viral copies (<250 copies / mL). A negative result must be combined with clinical observations, patient history, and epidemiological information. Fact Sheet for Patients:   StrictlyIdeas.no Fact Sheet for Healthcare Providers: BankingDealers.co.za This test is not yet approved or cleared  by the [place] FDA and has been authorized for detection and/or diagnosis of SARS-CoV-2 by FDA under an Emergency Use Authorization (EUA).  This EUA will remain in effect (meaning this test can be used) for the duration of the COVID-19 declaration under Section 564(b)(1) of the Act, 21 U.S.C. section 360bbb-3(b)(1), unless the authorization is terminated or revoked sooner. Performed at [facility], [address] [place], [place] [address]   MRSA PCR Screening     Status: Abnormal   Collection Time: 01/15/20  9:44 AM   Specimen: Nasopharyngeal  Result Value Ref Range Status   MRSA by PCR POSITIVE (A) NEGATIVE Final    Comment:  The GeneXpert MRSA Assay (FDA approved for NASAL specimens only), is one component of a comprehensive MRSA  colonization surveillance program. It is not intended to diagnose MRSA infection nor to guide or monitor treatment for MRSA infections. RESULT CALLED TO, READ BACK BY AND VERIFIED WITH: AMANDA FIELDS AT 1105 01/15/20.PMF Performed at [facility], [address] [place], [place] [address]     Time coordinating discharge: Over 30 minutes  SIGNED:  Lorella Nimrod, MD  [facility] 01/19/2020, 2:51 PM  If 7PM-7AM, please contact night-coverage [email or web address]  This record has been created using Systems analyst. Errors have been sought and corrected,but may not always be located. Such creation errors do not reflect on the standard of care.

## 2020-01-20 ENCOUNTER — Inpatient Hospital Stay: Payer: Medicare HMO

## 2020-01-22 ENCOUNTER — Other Ambulatory Visit: Payer: Self-pay

## 2020-01-22 ENCOUNTER — Inpatient Hospital Stay: Payer: Medicare HMO | Attending: Oncology

## 2020-01-22 DIAGNOSIS — D509 Iron deficiency anemia, unspecified: Secondary | ICD-10-CM | POA: Insufficient documentation

## 2020-01-22 LAB — CBC WITH DIFFERENTIAL/PLATELET
Abs Immature Granulocytes: 0.47 10*3/uL — ABNORMAL HIGH (ref 0.00–0.07)
Basophils Absolute: 0.1 10*3/uL (ref 0.0–0.1)
Basophils Relative: 0 %
Eosinophils Absolute: 0 10*3/uL (ref 0.0–0.5)
Eosinophils Relative: 0 %
HCT: 27.4 % — ABNORMAL LOW (ref 39.0–52.0)
Hemoglobin: 8.4 g/dL — ABNORMAL LOW (ref 13.0–17.0)
Immature Granulocytes: 4 %
Lymphocytes Relative: 3 %
Lymphs Abs: 0.3 10*3/uL — ABNORMAL LOW (ref 0.7–4.0)
MCH: 27.3 pg (ref 26.0–34.0)
MCHC: 30.7 g/dL (ref 30.0–36.0)
MCV: 89 fL (ref 80.0–100.0)
Monocytes Absolute: 0.5 10*3/uL (ref 0.1–1.0)
Monocytes Relative: 4 %
Neutro Abs: 11.7 10*3/uL — ABNORMAL HIGH (ref 1.7–7.7)
Neutrophils Relative %: 89 %
Platelets: 287 10*3/uL (ref 150–400)
RBC: 3.08 MIL/uL — ABNORMAL LOW (ref 4.22–5.81)
RDW: 14.6 % (ref 11.5–15.5)
WBC: 13.1 10*3/uL — ABNORMAL HIGH (ref 4.0–10.5)
nRBC: 0 % (ref 0.0–0.2)

## 2020-01-22 LAB — IRON AND TIBC
Iron: 61 ug/dL (ref 45–182)
Saturation Ratios: 39 % (ref 17.9–39.5)
TIBC: 157 ug/dL — ABNORMAL LOW (ref 250–450)
UIBC: 96 ug/dL

## 2020-01-22 LAB — FERRITIN: Ferritin: 1341 ng/mL — ABNORMAL HIGH (ref 24–336)

## 2020-01-25 DIAGNOSIS — J449 Chronic obstructive pulmonary disease, unspecified: Secondary | ICD-10-CM | POA: Diagnosis not present

## 2020-01-25 DIAGNOSIS — K9411 Enterostomy hemorrhage: Secondary | ICD-10-CM | POA: Diagnosis not present

## 2020-01-25 DIAGNOSIS — N184 Chronic kidney disease, stage 4 (severe): Secondary | ICD-10-CM | POA: Diagnosis not present

## 2020-01-25 DIAGNOSIS — I878 Other specified disorders of veins: Secondary | ICD-10-CM | POA: Diagnosis not present

## 2020-01-25 DIAGNOSIS — S72145A Nondisplaced intertrochanteric fracture of left femur, initial encounter for closed fracture: Secondary | ICD-10-CM | POA: Diagnosis not present

## 2020-01-25 DIAGNOSIS — Z9889 Other specified postprocedural states: Secondary | ICD-10-CM | POA: Diagnosis not present

## 2020-01-25 DIAGNOSIS — D649 Anemia, unspecified: Secondary | ICD-10-CM | POA: Diagnosis not present

## 2020-01-25 DIAGNOSIS — I129 Hypertensive chronic kidney disease with stage 1 through stage 4 chronic kidney disease, or unspecified chronic kidney disease: Secondary | ICD-10-CM | POA: Diagnosis not present

## 2020-01-25 DIAGNOSIS — E1122 Type 2 diabetes mellitus with diabetic chronic kidney disease: Secondary | ICD-10-CM | POA: Diagnosis not present

## 2020-01-25 DIAGNOSIS — K439 Ventral hernia without obstruction or gangrene: Secondary | ICD-10-CM | POA: Diagnosis not present

## 2020-01-25 DIAGNOSIS — I2510 Atherosclerotic heart disease of native coronary artery without angina pectoris: Secondary | ICD-10-CM | POA: Diagnosis not present

## 2020-01-25 DIAGNOSIS — Z09 Encounter for follow-up examination after completed treatment for conditions other than malignant neoplasm: Secondary | ICD-10-CM | POA: Diagnosis not present

## 2020-01-27 DIAGNOSIS — S72142D Displaced intertrochanteric fracture of left femur, subsequent encounter for closed fracture with routine healing: Secondary | ICD-10-CM | POA: Diagnosis not present

## 2020-01-28 ENCOUNTER — Other Ambulatory Visit: Payer: Self-pay

## 2020-01-28 ENCOUNTER — Encounter: Payer: Self-pay | Admitting: Emergency Medicine

## 2020-01-28 ENCOUNTER — Emergency Department: Payer: Medicare HMO

## 2020-01-28 ENCOUNTER — Inpatient Hospital Stay
Admission: EM | Admit: 2020-01-28 | Discharge: 2020-02-05 | DRG: 871 | Disposition: A | Discharge status: Hospice | Payer: Medicare HMO | Attending: Internal Medicine | Admitting: Internal Medicine

## 2020-01-28 DIAGNOSIS — N179 Acute kidney failure, unspecified: Secondary | ICD-10-CM | POA: Diagnosis not present

## 2020-01-28 DIAGNOSIS — M79604 Pain in right leg: Secondary | ICD-10-CM

## 2020-01-28 DIAGNOSIS — I5043 Acute on chronic combined systolic (congestive) and diastolic (congestive) heart failure: Secondary | ICD-10-CM | POA: Diagnosis present

## 2020-01-28 DIAGNOSIS — J9811 Atelectasis: Secondary | ICD-10-CM | POA: Diagnosis not present

## 2020-01-28 DIAGNOSIS — Z20822 Contact with and (suspected) exposure to covid-19: Secondary | ICD-10-CM | POA: Diagnosis not present

## 2020-01-28 DIAGNOSIS — I499 Cardiac arrhythmia, unspecified: Secondary | ICD-10-CM | POA: Diagnosis not present

## 2020-01-28 DIAGNOSIS — Z9119 Patient's noncompliance with other medical treatment and regimen: Secondary | ICD-10-CM

## 2020-01-28 DIAGNOSIS — I361 Nonrheumatic tricuspid (valve) insufficiency: Secondary | ICD-10-CM | POA: Diagnosis not present

## 2020-01-28 DIAGNOSIS — I2510 Atherosclerotic heart disease of native coronary artery without angina pectoris: Secondary | ICD-10-CM | POA: Diagnosis present

## 2020-01-28 DIAGNOSIS — Z936 Other artificial openings of urinary tract status: Secondary | ICD-10-CM

## 2020-01-28 DIAGNOSIS — N390 Urinary tract infection, site not specified: Secondary | ICD-10-CM | POA: Diagnosis present

## 2020-01-28 DIAGNOSIS — J150 Pneumonia due to Klebsiella pneumoniae: Secondary | ICD-10-CM | POA: Diagnosis not present

## 2020-01-28 DIAGNOSIS — I472 Ventricular tachycardia: Secondary | ICD-10-CM | POA: Diagnosis present

## 2020-01-28 DIAGNOSIS — Z8551 Personal history of malignant neoplasm of bladder: Secondary | ICD-10-CM

## 2020-01-28 DIAGNOSIS — M726 Necrotizing fasciitis: Secondary | ICD-10-CM | POA: Diagnosis not present

## 2020-01-28 DIAGNOSIS — M869 Osteomyelitis, unspecified: Secondary | ICD-10-CM | POA: Diagnosis present

## 2020-01-28 DIAGNOSIS — Z515 Encounter for palliative care: Secondary | ICD-10-CM | POA: Diagnosis not present

## 2020-01-28 DIAGNOSIS — N181 Chronic kidney disease, stage 1: Secondary | ICD-10-CM | POA: Diagnosis not present

## 2020-01-28 DIAGNOSIS — R531 Weakness: Secondary | ICD-10-CM

## 2020-01-28 DIAGNOSIS — I340 Nonrheumatic mitral (valve) insufficiency: Secondary | ICD-10-CM | POA: Diagnosis not present

## 2020-01-28 DIAGNOSIS — Z8249 Family history of ischemic heart disease and other diseases of the circulatory system: Secondary | ICD-10-CM

## 2020-01-28 DIAGNOSIS — Z951 Presence of aortocoronary bypass graft: Secondary | ICD-10-CM

## 2020-01-28 DIAGNOSIS — E861 Hypovolemia: Secondary | ICD-10-CM | POA: Diagnosis present

## 2020-01-28 DIAGNOSIS — I471 Supraventricular tachycardia: Secondary | ICD-10-CM | POA: Diagnosis present

## 2020-01-28 DIAGNOSIS — I82409 Acute embolism and thrombosis of unspecified deep veins of unspecified lower extremity: Secondary | ICD-10-CM | POA: Diagnosis present

## 2020-01-28 DIAGNOSIS — J449 Chronic obstructive pulmonary disease, unspecified: Secondary | ICD-10-CM | POA: Diagnosis present

## 2020-01-28 DIAGNOSIS — D631 Anemia in chronic kidney disease: Secondary | ICD-10-CM | POA: Diagnosis present

## 2020-01-28 DIAGNOSIS — Z7189 Other specified counseling: Secondary | ICD-10-CM | POA: Diagnosis not present

## 2020-01-28 DIAGNOSIS — Z7982 Long term (current) use of aspirin: Secondary | ICD-10-CM

## 2020-01-28 DIAGNOSIS — N183 Chronic kidney disease, stage 3 unspecified: Secondary | ICD-10-CM

## 2020-01-28 DIAGNOSIS — Z79899 Other long term (current) drug therapy: Secondary | ICD-10-CM

## 2020-01-28 DIAGNOSIS — Z452 Encounter for adjustment and management of vascular access device: Secondary | ICD-10-CM

## 2020-01-28 DIAGNOSIS — I429 Cardiomyopathy, unspecified: Secondary | ICD-10-CM | POA: Diagnosis not present

## 2020-01-28 DIAGNOSIS — N170 Acute kidney failure with tubular necrosis: Secondary | ICD-10-CM | POA: Diagnosis not present

## 2020-01-28 DIAGNOSIS — I252 Old myocardial infarction: Secondary | ICD-10-CM

## 2020-01-28 DIAGNOSIS — A419 Sepsis, unspecified organism: Secondary | ICD-10-CM | POA: Diagnosis not present

## 2020-01-28 DIAGNOSIS — E872 Acidosis: Secondary | ICD-10-CM | POA: Diagnosis present

## 2020-01-28 DIAGNOSIS — E871 Hypo-osmolality and hyponatremia: Secondary | ICD-10-CM | POA: Diagnosis not present

## 2020-01-28 DIAGNOSIS — I82401 Acute embolism and thrombosis of unspecified deep veins of right lower extremity: Secondary | ICD-10-CM | POA: Diagnosis not present

## 2020-01-28 DIAGNOSIS — I484 Atypical atrial flutter: Secondary | ICD-10-CM

## 2020-01-28 DIAGNOSIS — E1122 Type 2 diabetes mellitus with diabetic chronic kidney disease: Secondary | ICD-10-CM | POA: Diagnosis present

## 2020-01-28 DIAGNOSIS — Z823 Family history of stroke: Secondary | ICD-10-CM

## 2020-01-28 DIAGNOSIS — I5032 Chronic diastolic (congestive) heart failure: Secondary | ICD-10-CM | POA: Diagnosis not present

## 2020-01-28 DIAGNOSIS — I480 Paroxysmal atrial fibrillation: Secondary | ICD-10-CM | POA: Diagnosis not present

## 2020-01-28 DIAGNOSIS — M79661 Pain in right lower leg: Secondary | ICD-10-CM | POA: Diagnosis not present

## 2020-01-28 DIAGNOSIS — E11649 Type 2 diabetes mellitus with hypoglycemia without coma: Secondary | ICD-10-CM | POA: Diagnosis present

## 2020-01-28 DIAGNOSIS — I82432 Acute embolism and thrombosis of left popliteal vein: Secondary | ICD-10-CM | POA: Diagnosis present

## 2020-01-28 DIAGNOSIS — R0989 Other specified symptoms and signs involving the circulatory and respiratory systems: Secondary | ICD-10-CM

## 2020-01-28 DIAGNOSIS — J811 Chronic pulmonary edema: Secondary | ICD-10-CM | POA: Diagnosis not present

## 2020-01-28 DIAGNOSIS — R319 Hematuria, unspecified: Secondary | ICD-10-CM | POA: Diagnosis not present

## 2020-01-28 DIAGNOSIS — Z66 Do not resuscitate: Secondary | ICD-10-CM | POA: Diagnosis not present

## 2020-01-28 DIAGNOSIS — E1169 Type 2 diabetes mellitus with other specified complication: Secondary | ICD-10-CM | POA: Diagnosis present

## 2020-01-28 DIAGNOSIS — I517 Cardiomegaly: Secondary | ICD-10-CM | POA: Diagnosis not present

## 2020-01-28 DIAGNOSIS — Z87891 Personal history of nicotine dependence: Secondary | ICD-10-CM

## 2020-01-28 DIAGNOSIS — I82402 Acute embolism and thrombosis of unspecified deep veins of left lower extremity: Secondary | ICD-10-CM | POA: Diagnosis not present

## 2020-01-28 DIAGNOSIS — I959 Hypotension, unspecified: Secondary | ICD-10-CM

## 2020-01-28 DIAGNOSIS — T8182XA Emphysema (subcutaneous) resulting from a procedure, initial encounter: Secondary | ICD-10-CM | POA: Diagnosis not present

## 2020-01-28 DIAGNOSIS — N184 Chronic kidney disease, stage 4 (severe): Secondary | ICD-10-CM | POA: Diagnosis not present

## 2020-01-28 DIAGNOSIS — I130 Hypertensive heart and chronic kidney disease with heart failure and stage 1 through stage 4 chronic kidney disease, or unspecified chronic kidney disease: Secondary | ICD-10-CM | POA: Diagnosis present

## 2020-01-28 DIAGNOSIS — I714 Abdominal aortic aneurysm, without rupture: Secondary | ICD-10-CM | POA: Diagnosis present

## 2020-01-28 DIAGNOSIS — I4891 Unspecified atrial fibrillation: Secondary | ICD-10-CM | POA: Diagnosis present

## 2020-01-28 DIAGNOSIS — Z8619 Personal history of other infectious and parasitic diseases: Secondary | ICD-10-CM

## 2020-01-28 DIAGNOSIS — I10 Essential (primary) hypertension: Secondary | ICD-10-CM | POA: Diagnosis not present

## 2020-01-28 DIAGNOSIS — E785 Hyperlipidemia, unspecified: Secondary | ICD-10-CM | POA: Diagnosis present

## 2020-01-28 DIAGNOSIS — R2242 Localized swelling, mass and lump, left lower limb: Secondary | ICD-10-CM

## 2020-01-28 DIAGNOSIS — M6080 Other myositis, unspecified site: Secondary | ICD-10-CM

## 2020-01-28 DIAGNOSIS — N189 Chronic kidney disease, unspecified: Secondary | ICD-10-CM | POA: Diagnosis present

## 2020-01-28 DIAGNOSIS — Z803 Family history of malignant neoplasm of breast: Secondary | ICD-10-CM

## 2020-01-28 DIAGNOSIS — J90 Pleural effusion, not elsewhere classified: Secondary | ICD-10-CM | POA: Diagnosis not present

## 2020-01-28 DIAGNOSIS — R000 Tachycardia, unspecified: Secondary | ICD-10-CM | POA: Diagnosis not present

## 2020-01-28 HISTORY — DX: Heart failure, unspecified: I50.9

## 2020-01-28 LAB — CBC WITH DIFFERENTIAL/PLATELET
Abs Immature Granulocytes: 0.26 10*3/uL — ABNORMAL HIGH (ref 0.00–0.07)
Basophils Absolute: 0.1 10*3/uL (ref 0.0–0.1)
Basophils Relative: 0 %
Eosinophils Absolute: 0 10*3/uL (ref 0.0–0.5)
Eosinophils Relative: 0 %
HCT: 26.5 % — ABNORMAL LOW (ref 39.0–52.0)
Hemoglobin: 8.2 g/dL — ABNORMAL LOW (ref 13.0–17.0)
Immature Granulocytes: 2 %
Lymphocytes Relative: 3 %
Lymphs Abs: 0.4 10*3/uL — ABNORMAL LOW (ref 0.7–4.0)
MCH: 27.2 pg (ref 26.0–34.0)
MCHC: 30.9 g/dL (ref 30.0–36.0)
MCV: 87.7 fL (ref 80.0–100.0)
Monocytes Absolute: 0.4 10*3/uL (ref 0.1–1.0)
Monocytes Relative: 3 %
Neutro Abs: 13.4 10*3/uL — ABNORMAL HIGH (ref 1.7–7.7)
Neutrophils Relative %: 92 %
Platelets: 306 10*3/uL (ref 150–400)
RBC: 3.02 MIL/uL — ABNORMAL LOW (ref 4.22–5.81)
RDW: 15 % (ref 11.5–15.5)
Smear Review: NORMAL
WBC: 14.5 10*3/uL — ABNORMAL HIGH (ref 4.0–10.5)
nRBC: 0 % (ref 0.0–0.2)

## 2020-01-28 LAB — URINALYSIS, COMPLETE (UACMP) WITH MICROSCOPIC
Bacteria, UA: NONE SEEN
Bilirubin Urine: NEGATIVE
Glucose, UA: NEGATIVE mg/dL
Ketones, ur: NEGATIVE mg/dL
Nitrite: NEGATIVE
Protein, ur: 100 mg/dL — AB
Specific Gravity, Urine: 1.01 (ref 1.005–1.030)
pH: 7 (ref 5.0–8.0)

## 2020-01-28 LAB — TROPONIN I (HIGH SENSITIVITY): Troponin I (High Sensitivity): 11 ng/L (ref <18)

## 2020-01-28 LAB — APTT: aPTT: 64 seconds — ABNORMAL HIGH (ref 24–36)

## 2020-01-28 LAB — GLUCOSE, CAPILLARY
Glucose-Capillary: 20 mg/dL — CL (ref 70–99)
Glucose-Capillary: 75 mg/dL (ref 70–99)
Glucose-Capillary: 88 mg/dL (ref 70–99)

## 2020-01-28 LAB — PROTIME-INR
INR: 1.5 — ABNORMAL HIGH (ref 0.8–1.2)
Prothrombin Time: 17.9 seconds — ABNORMAL HIGH (ref 11.4–15.2)

## 2020-01-28 LAB — SARS CORONAVIRUS 2 BY RT PCR (HOSPITAL ORDER, PERFORMED IN ~~LOC~~ HOSPITAL LAB): SARS Coronavirus 2: NEGATIVE

## 2020-01-28 LAB — COMPREHENSIVE METABOLIC PANEL
ALT: 8 U/L (ref 0–44)
AST: 25 U/L (ref 15–41)
Albumin: 2.1 g/dL — ABNORMAL LOW (ref 3.5–5.0)
Alkaline Phosphatase: 138 U/L — ABNORMAL HIGH (ref 38–126)
Anion gap: 14 (ref 5–15)
BUN: 101 mg/dL — ABNORMAL HIGH (ref 8–23)
CO2: 17 mmol/L — ABNORMAL LOW (ref 22–32)
Calcium: 7.5 mg/dL — ABNORMAL LOW (ref 8.9–10.3)
Chloride: 92 mmol/L — ABNORMAL LOW (ref 98–111)
Creatinine, Ser: 4.06 mg/dL — ABNORMAL HIGH (ref 0.61–1.24)
GFR calc Af Amer: 15 mL/min — ABNORMAL LOW (ref >60)
GFR calc non Af Amer: 13 mL/min — ABNORMAL LOW (ref >60)
Glucose, Bld: 52 mg/dL — ABNORMAL LOW (ref 70–99)
Potassium: 4.7 mmol/L (ref 3.5–5.1)
Sodium: 123 mmol/L — ABNORMAL LOW (ref 135–145)
Total Bilirubin: 1.2 mg/dL (ref 0.3–1.2)
Total Protein: 6.7 g/dL (ref 6.5–8.1)

## 2020-01-28 LAB — OSMOLALITY, URINE: Osmolality, Ur: 292 mOsm/kg — ABNORMAL LOW (ref 300–900)

## 2020-01-28 LAB — SODIUM, URINE, RANDOM: Sodium, Ur: 37 mmol/L

## 2020-01-28 LAB — LACTIC ACID, PLASMA: Lactic Acid, Venous: 1.4 mmol/L (ref 0.5–1.9)

## 2020-01-28 MED ORDER — PANTOPRAZOLE SODIUM 40 MG PO TBEC
40.0000 mg | DELAYED_RELEASE_TABLET | Freq: Every day | ORAL | Status: DC
  Administered 2020-01-28 – 2020-01-31 (×3): 40 mg via ORAL
  Filled 2020-01-28 (×3): qty 1

## 2020-01-28 MED ORDER — ONDANSETRON HCL 4 MG/2ML IJ SOLN
4.0000 mg | Freq: Four times a day (QID) | INTRAMUSCULAR | Status: DC | PRN
  Administered 2020-01-30: 4 mg via INTRAVENOUS
  Filled 2020-01-28: qty 2

## 2020-01-28 MED ORDER — IPRATROPIUM-ALBUTEROL 0.5-2.5 (3) MG/3ML IN SOLN: 3.0000 mL | Freq: Four times a day (QID) | RESPIRATORY_TRACT | Status: DC | PRN

## 2020-01-28 MED ORDER — ONDANSETRON HCL 4 MG PO TABS: 4.0000 mg | ORAL_TABLET | Freq: Four times a day (QID) | ORAL | Status: DC | PRN

## 2020-01-28 MED ORDER — HEPARIN (PORCINE) 25000 UT/250ML-% IV SOLN
1100.0000 [IU]/h | INTRAVENOUS | Status: DC
  Administered 2020-01-28 – 2020-02-01 (×5): 1100 [IU]/h via INTRAVENOUS
  Filled 2020-01-28 (×5): qty 250

## 2020-01-28 MED ORDER — DEXTROSE-NACL 5-0.9 % IV SOLN: INTRAVENOUS | Status: DC

## 2020-01-28 MED ORDER — ASPIRIN EC 81 MG PO TBEC
81.0000 mg | DELAYED_RELEASE_TABLET | Freq: Every evening | ORAL | Status: DC
  Administered 2020-01-28 – 2020-02-01 (×5): 81 mg via ORAL
  Filled 2020-01-28 (×5): qty 1

## 2020-01-28 MED ORDER — FERROUS SULFATE 325 (65 FE) MG PO TABS
325.0000 mg | ORAL_TABLET | Freq: Two times a day (BID) | ORAL | Status: DC
  Administered 2020-01-28 – 2020-02-01 (×7): 325 mg via ORAL
  Filled 2020-01-28 (×8): qty 1

## 2020-01-28 MED ORDER — ACETAMINOPHEN 500 MG PO TABS: 1000.0000 mg | ORAL_TABLET | Freq: Three times a day (TID) | ORAL | Status: DC | PRN

## 2020-01-28 MED ORDER — METOPROLOL TARTRATE 25 MG PO TABS: 25.0000 mg | ORAL_TABLET | Freq: Two times a day (BID) | ORAL | Status: DC

## 2020-01-28 MED ORDER — TRAMADOL HCL 50 MG PO TABS
50.0000 mg | ORAL_TABLET | Freq: Four times a day (QID) | ORAL | Status: DC | PRN
  Administered 2020-01-28 – 2020-02-01 (×10): 50 mg via ORAL
  Filled 2020-01-28 (×10): qty 1

## 2020-01-28 MED ORDER — SODIUM BICARBONATE 650 MG PO TABS
1300.0000 mg | ORAL_TABLET | Freq: Three times a day (TID) | ORAL | Status: DC
  Administered 2020-01-28 – 2020-02-01 (×11): 1300 mg via ORAL
  Filled 2020-01-28 (×17): qty 2

## 2020-01-28 MED ORDER — SODIUM CHLORIDE 0.9% FLUSH
3.0000 mL | Freq: Two times a day (BID) | INTRAVENOUS | Status: DC
  Administered 2020-01-28 – 2020-02-01 (×5): 3 mL via INTRAVENOUS

## 2020-01-28 MED ORDER — SODIUM CHLORIDE 0.9 % IV SOLN
250.0000 mL | INTRAVENOUS | Status: DC | PRN
  Administered 2020-01-30: 250 mL via INTRAVENOUS

## 2020-01-28 MED ORDER — ATORVASTATIN CALCIUM 20 MG PO TABS
80.0000 mg | ORAL_TABLET | Freq: Every day | ORAL | Status: DC
  Administered 2020-01-28 – 2020-02-01 (×5): 80 mg via ORAL
  Filled 2020-01-28 (×5): qty 4

## 2020-01-28 MED ORDER — SODIUM CHLORIDE 0.9% FLUSH: 3.0000 mL | INTRAVENOUS | Status: DC | PRN

## 2020-01-28 MED ORDER — HEPARIN BOLUS VIA INFUSION
3500.0000 [IU] | Freq: Once | INTRAVENOUS | Status: AC
  Administered 2020-01-28: 3500 [IU] via INTRAVENOUS
  Filled 2020-01-28: qty 3500

## 2020-01-28 MED ORDER — DEXTROSE 50 % IV SOLN
INTRAVENOUS | Status: AC
  Filled 2020-01-28: qty 50

## 2020-01-28 MED ORDER — SODIUM CHLORIDE 0.9 % IV SOLN: Freq: Once | INTRAVENOUS | Status: AC

## 2020-01-28 MED ORDER — SODIUM CHLORIDE 0.9 % IV SOLN
500.0000 mg | Freq: Two times a day (BID) | INTRAVENOUS | Status: DC
  Administered 2020-01-28 – 2020-01-31 (×5): 500 mg via INTRAVENOUS
  Filled 2020-01-28: qty 500
  Filled 2020-01-28 (×6): qty 0.5
  Filled 2020-01-28 (×2): qty 500

## 2020-01-28 MED ORDER — SODIUM CHLORIDE 0.9 % IV BOLUS
500.0000 mL | Freq: Once | INTRAVENOUS | Status: AC
  Administered 2020-01-28: 500 mL via INTRAVENOUS

## 2020-01-28 NOTE — Progress Notes (Signed)
ANTICOAGULATION CONSULT NOTE  Pharmacy Consult for heparin Indication: DVT  No Known Allergies  Patient Measurements: Height: 5\' 7"  (170.2 cm) Weight: 67.6 kg (149 lb) IBW/kg (Calculated) : 66.1 Heparin Dosing Weight: 67 kg  Vital Signs: BP: 112/58 (06/24 1800) Pulse Rate: 124 (06/24 1800)  Labs: Recent Labs    01/28/20 0425  HGB 8.2*  HCT 26.5*  PLT 306  CREATININE 4.06*  TROPONINIHS 11    Estimated Creatinine Clearance: 14 mL/min (A) (by C-G formula based on SCr of 4.06 mg/dL (H)).   Medical History: Past Medical History:  Diagnosis Date  . (HFpEF) heart failure with preserved ejection fraction ([place])    a. 03/2019 Echo: EF 60-65%, nl RV fxn.  Marland Kitchen AAA (abdominal aortic aneurysm) ([place]) 09/2019   3.0 infrarenal AAA incidentally noted on abdominal/pelvic CT  . Bladder cancer ([place])   . C. difficile colitis 04/2019  . CKD (chronic kidney disease), stage IV ([place])   . COPD (chronic obstructive pulmonary disease) ([place])   . Coronary artery disease    a. 10/2017 NSTEMI/Cath: LM 69m, 75d, LAD 75ost, 90p, D1 100, LCX 81m/d, OM1 90, RCA 25p; b. 10/2017 CABG x 4 ([facility]): LIMA->LAD, VG->Diag, VG->OM1, VG->OM2.  . Hyperlipidemia   . Hypertension   . NSVT (nonsustained ventricular tachycardia) ([place])    a. 06/2019 Zio: 2 runs of NSVT up to 12 beats, max rate 187 bpm.  . PSVT (paroxysmal supraventricular tachycardia) ([place])    06/2019 Zio: 1820 episodes of SVT lasting up to 00:01:16 w/ max rate of 210.  . Type 2 diabetes mellitus [facility])     Assessment: 79 year old male with ultrasound left lower extremity positive for acute nonocclusive DVT within the popliteal vein. Pharmacy consulted for heparin drip.   Goal of Therapy:  Heparin level 0.3-0.7 units/ml Monitor platelets by anticoagulation protocol: Yes   Plan:  Heparin 3500 unit bolus followed by heparin drip at 1100 units/hr. HL 6/25 at 0300. CBC daily while on heparin drip.  Tawnya Crook, PharmD 01/28/2020,6:57 PM

## 2020-01-28 NOTE — ED Notes (Signed)
Urostomy emptied and urine sample obtained

## 2020-01-28 NOTE — Progress Notes (Signed)
Pharmacy Antibiotic Note  Bryan Brewer is a 79 y.o. male admitted on 01/28/2020. Pharmacy has been consulted for meropenem dosing.  Plan: Meropenem 500 mg IV q12h  Height: 5\' 7"  (170.2 cm) Weight: 67.6 kg (149 lb) IBW/kg (Calculated) : 66.1  Temp (24hrs), Avg:98.5 F (36.9 C), Min:98.5 F (36.9 C), Max:98.5 F (36.9 C)  Recent Labs  Lab 01/22/20 1323 01/28/20 0425 01/28/20 0904  WBC 13.1* 14.5*  --   CREATININE  --  4.06*  --   LATICACIDVEN  --   --  1.4    Estimated Creatinine Clearance: 14 mL/min (A) (by C-G formula based on SCr of 4.06 mg/dL (H)).    No Known Allergies  Antimicrobials this admission: Meropenem 6/24 >>   Thank you for allowing pharmacy to be a part of this patient's care.  Tawnya Crook, PharmD 01/28/2020 8:35 PM

## 2020-01-28 NOTE — ED Notes (Signed)
Fluids to be started once 2nd IV access point obtained; Merrem to be started once received from pharm.

## 2020-01-28 NOTE — H&P (Addendum)
History and Physical    Bryan Brewer JJO:[id number] DOB: 1941-03-05 DOA: 01/28/2020  PCP: Tracie Harrier, MD   Patient coming from: Home  I have personally briefly reviewed patient's old medical records in [place]  Chief Complaint: Weakness Patient is a poor historian  HPI: Bryan Brewer is a 79 y.o. male with medical history significant for CAD, HFpEF, CKD 4, COPD, bladder cancer status post cystoprostatectomy with urostomy and ileal conduit, CAD s/p CABG who presents to the emergency room for evaluation of weakness and nausea.  Patient and her spouse were also concerned that he had some blood in his urine and had been on a blood thinner since he was discharged from the hospital following an intramedullary nail placement in his left hip. He denies having any fever or chills, denies having any chest pain, abdominal pain or changes in his bowel habits. Labs revealed worsening of his renal function from baseline, his serum creatinine on discharge was 2.86 and today on admission it is 4.06, sodium is low at 123 and he has a white count of 14.5 with a hemoglobin of 8.2 that is chronic. Chest x-ray shows no acute findings and he has pyuria that appears to be chronic. Patient has swelling of his left lower extremity and venous Doppler shows Positive for acute nonocclusive DVT within the popliteal vein.  ED Course: Patient is a 79 year old Caucasian male who was recently discharged from the hospital following intramedullary nail placement in his left hip for repair of left hip fracture.  Patient presents to the emergency room for evaluation of generalized weakness and is noted to have worsening of his renal function from baseline with associated hyponatremia and a new DVT in his left leg.  He will be admitted to the hospital for further evaluation  Review of Systems: As per HPI otherwise 10 point review of systems negative.    Past Medical History:  Diagnosis Date  . (HFpEF) heart  failure with preserved ejection fraction ([place])    a. 03/2019 Echo: EF 60-65%, nl RV fxn.  Marland Kitchen AAA (abdominal aortic aneurysm) ([place]) 09/2019   3.0 infrarenal AAA incidentally noted on abdominal/pelvic CT  . Bladder cancer ([place])   . C. difficile colitis 04/2019  . CKD (chronic kidney disease), stage IV ([place])   . COPD (chronic obstructive pulmonary disease) ([place])   . Coronary artery disease    a. 10/2017 NSTEMI/Cath: LM 62m 75d, LAD 75ost, 90p, D1 100, LCX 739m, OM1 90, RCA 25p; b. 10/2017 CABG x 4 ([facility]): LIMA->LAD, VG->Diag, VG->OM1, VG->OM2.  . Hyperlipidemia   . Hypertension   . NSVT (nonsustained ventricular tachycardia) ([place]   a. 06/2019 Zio: 2 runs of NSVT up to 12 beats, max rate 187 bpm.  . PSVT (paroxysmal supraventricular tachycardia) ([place]   06/2019 Zio: 1820 episodes of SVT lasting up to 00:01:16 w/ max rate of 210.  . Type 2 diabetes mellitus ([place]    Past Surgical History:  Procedure Laterality Date  . COLONOSCOPY    . CORONARY ARTERY BYPASS GRAFT  2019   [facility]; LIMA-LAD, SVG-diagonal, SVG-OM1, and SVG-OM2  . ESOPHAGOGASTRODUODENOSCOPY N/A 04/23/2019   Procedure: ESOPHAGOGASTRODUODENOSCOPY (EGD);  Surgeon: AnJonathon BellowsMD;  [facility];  Service: Gastroenterology;  Laterality: N/A;  . ESOPHAGOGASTRODUODENOSCOPY (EGD) WITH PROPOFOL N/A 04/21/2019   Procedure: ESOPHAGOGASTRODUODENOSCOPY (EGD) WITH PROPOFOL;  Surgeon: AnJonathon BellowsMD;  [facility];  Service: Gastroenterology;  Laterality: N/A;  . INTRAMEDULLARY (IM) NAIL INTERTROCHANTERIC Left 01/14/2020   Procedure:  INTRAMEDULLARY (IM) NAIL INTERTROCHANTRIC;  Surgeon: Corky Mull, MD;  Location: [facility];  Service: Orthopedics;  Laterality: Left;  . LEFT HEART CATH AND CORONARY ANGIOGRAPHY N/A 10/31/2017   Procedure: LEFT HEART CATH AND CORONARY ANGIOGRAPHY;  Surgeon: Yolonda Kida, MD;  Location: [facility] CV LAB;  Service: Cardiovascular;  Laterality: N/A;  . REVISION UROSTOMY CUTANEOUS        reports that he quit smoking about 8 years ago. His smoking use included cigarettes. He has a 52.50 pack-year smoking history. He has never used smokeless tobacco. He reports that he does not drink alcohol and does not use drugs.  No Known Allergies  Family History  Problem Relation Age of Onset  . CAD Mother   . Stroke Mother   . CAD Father   . Stroke Father   . Breast cancer Sister      Prior to Admission medications   Medication Sig Start Date End Date Taking? Authorizing Provider  aspirin EC 81 MG tablet Take 81 mg by mouth every evening.    Yes [provider]  atorvastatin (LIPITOR) 80 MG tablet Take 80 mg by mouth at bedtime.   Yes [provider]  glipiZIDE (GLUCOTROL) 5 MG tablet Take 0.5 tablets (2.5 mg total) by mouth daily before breakfast. 05/01/19 06/30/28 Yes Sainani, Belia Heman, MD  metoprolol tartrate (LOPRESSOR) 25 MG tablet Take 1 tablet (25 mg total) by mouth 2 (two) times daily. 06/26/19 01/28/20 Yes End, Harrell Gave, MD  pantoprazole (PROTONIX) 40 MG tablet Take 1 tablet (40 mg total) by mouth daily. 04/25/19 04/24/20 Yes Dustin Flock, MD  sodium bicarbonate 650 MG tablet Take 1,300 mg by mouth 3 (three) times daily.   Yes [provider]  acetaminophen (TYLENOL) 500 MG tablet Take 2 tablets (1,000 mg total) by mouth 3 (three) times daily as needed for mild pain or moderate pain (pain score 1-3 or temp > 100.5). 01/16/20   Enzo Bi, MD  blood glucose meter kit and supplies KIT Dispense based on patient and insurance preference. Use up to four times daily as directed. (FOR ICD-9 250.00, 250.01). 04/04/19   Gladstone Lighter, MD  Darbepoetin Alfa (ARANESP) 40 MCG/0.4ML SOSY injection Inject 0.25 mLs (25 mcg total) into the skin every 7 (seven) days. 01/26/20   Lorella Nimrod, MD  enoxaparin (LOVENOX) 40 MG/0.4ML injection Inject 0.4 mLs (40 mg total) into the skin daily for 14 doses. Patient not taking: Reported on 01/28/2020 01/16/20 01/30/20   Enzo Bi, MD  ferrous sulfate 325 (65 FE) MG EC tablet Take 1 tablet (325 mg total) by mouth 2 (two) times daily. Patient not taking: Reported on 01/28/2020 01/19/20 01/18/21  Lorella Nimrod, MD  furosemide (LASIX) 20 MG tablet Hold until outpatient doctor followup due to low normal BP. Patient not taking: Reported on 01/28/2020 01/16/20   Enzo Bi, MD  ipratropium-albuterol (DUONEB) 0.5-2.5 (3) MG/3ML SOLN Take 3 mLs by nebulization every 6 (six) hours as needed (wheezing, shortness of breath). 04/04/19   Gladstone Lighter, MD  traMADol (ULTRAM) 50 MG tablet Take 1 tablet (50 mg total) by mouth every 6 (six) hours as needed for moderate pain. 01/16/20   Enzo Bi, MD    Physical Exam: Vitals:   01/28/20 1530 01/28/20 1540 01/28/20 1558 01/28/20 1600  BP: (!) 98/57 115/61  109/61  Pulse:  (!) 118 (!) 118   Resp:  16    Temp:      TempSrc:      SpO2:  100% 100%  Weight:      Height:         Vitals:   01/28/20 1530 01/28/20 1540 01/28/20 1558 01/28/20 1600  BP: (!) 98/57 115/61  109/61  Pulse:  (!) 118 (!) 118   Resp:  16    Temp:      TempSrc:      SpO2:  100% 100%   Weight:      Height:        Constitutional: NAD, alert and oriented x 2. Eyes: PERRL, lids and conjunctivae pallor ENMT: Mucous membranes are dry Neck: normal, supple, no masses, no thyromegaly Respiratory: clear to auscultation bilaterally, no wheezing, no crackles. Normal respiratory effort. No accessory muscle use.  Cardiovascular: Tachycardic, no murmurs / rubs / gallops.  Left lower extremity extremity edema. 2+ pedal pulses. No carotid bruits.  Abdomen: no tenderness, no masses palpated. No hepatosplenomegaly. Bowel sounds positive.  Urostomy Musculoskeletal: no clubbing / cyanosis. No joint deformity upper and lower extremities.  Skin: no rashes, lesions, ulcers.  Neurologic: Generalized weakness Psychiatric: Normal mood and affect.   Labs on Admission: I have personally reviewed following labs and  imaging studies  CBC: Recent Labs  Lab 01/22/20 1323 01/28/20 0425  WBC 13.1* 14.5*  NEUTROABS 11.7* 13.4*  HGB 8.4* 8.2*  HCT 27.4* 26.5*  MCV 89.0 87.7  PLT 287 390   Basic Metabolic Panel: Recent Labs  Lab 01/28/20 0425  NA 123*  K 4.7  CL 92*  CO2 17*  GLUCOSE 52*  BUN 101*  CREATININE 4.06*  CALCIUM 7.5*   GFR: Estimated Creatinine Clearance: 14 mL/min (A) (by C-G formula based on SCr of 4.06 mg/dL (H)). Liver Function Tests: Recent Labs  Lab 01/28/20 0425  AST 25  ALT 8  ALKPHOS 138*  BILITOT 1.2  PROT 6.7  ALBUMIN 2.1*   No results for input(s): LIPASE, AMYLASE in the last 168 hours. No results for input(s): AMMONIA in the last 168 hours. Coagulation Profile: No results for input(s): INR, PROTIME in the last 168 hours. Cardiac Enzymes: No results for input(s): CKTOTAL, CKMB, CKMBINDEX, TROPONINI in the last 168 hours. BNP (last 3 results) No results for input(s): PROBNP in the last 8760 hours. HbA1C: No results for input(s): HGBA1C in the last 72 hours. CBG: Recent Labs  Lab 01/28/20 1638  GLUCAP 20*   Lipid Profile: No results for input(s): CHOL, HDL, LDLCALC, TRIG, CHOLHDL, LDLDIRECT in the last 72 hours. Thyroid Function Tests: No results for input(s): TSH, T4TOTAL, FREET4, T3FREE, THYROIDAB in the last 72 hours. Anemia Panel: No results for input(s): VITAMINB12, FOLATE, FERRITIN, TIBC, IRON, RETICCTPCT in the last 72 hours. Urine analysis:    Component Value Date/Time   COLORURINE YELLOW (A) 01/28/2020 0959   APPEARANCEUR CLOUDY (A) 01/28/2020 0959   LABSPEC 1.010 01/28/2020 0959   PHURINE 7.0 01/28/2020 0959   GLUCOSEU NEGATIVE 01/28/2020 0959   HGBUR SMALL (A) 01/28/2020 0959   BILIRUBINUR NEGATIVE 01/28/2020 [place] 01/28/2020 0959   PROTEINUR 100 (A) 01/28/2020 0959   NITRITE NEGATIVE 01/28/2020 0959   LEUKOCYTESUR MODERATE (A) 01/28/2020 0959    Radiological Exams on Admission: US Venous Img Lower  Unilateral Left (DVT)  Result Date: 01/28/2020 CLINICAL DATA:  Left leg pain and swelling EXAM: Left LOWER EXTREMITY VENOUS DOPPLER ULTRASOUND TECHNIQUE: Gray-scale sonography with compression, as well as color and duplex ultrasound, were performed to evaluate the deep venous system(s) from the level of the common femoral vein through the popliteal and proximal calf veins.  COMPARISON:  None. FINDINGS: VENOUS Normal compressibility of the common femoral and superficial femoral veins. Noncompressible left popliteal vein with nonocclusive thrombus present. Peroneal veins are not well demonstrated. Visualized portions of profunda femoral vein and great saphenous vein unremarkable. Doppler waveforms show normal direction of venous flow, normal respiratory plasticity and response to augmentation within the patent lower extremity veins. Limited views of the contralateral common femoral vein are unremarkable. OTHER Left groin lymph nodes measuring up to 4.7 cm. Fluid and edema within the subcutaneous soft tissues. Limitations: none IMPRESSION: Positive for acute nonocclusive DVT within the popliteal vein. These results will be called to the ordering clinician or representative by the Radiologist Assistant, and communication documented in the PACS or [employer]. Electronically Signed   By: Donavan Foil M.D.   On: 01/28/2020 15:32   DG Chest Portable 1 View  Result Date: 01/28/2020 CLINICAL DATA:  Shortness of breath, fell 2 weeks ago and broke LEFT hip, decreased urine output, gross hematuria with strong smelling urine, history heart failure, COPD, coronary artery disease, type II diabetes mellitus, arrhythmias, hypertension EXAM: PORTABLE CHEST 1 VIEW COMPARISON:  Portable exam 0834 hours compared to 01/13/2020 FINDINGS: Rotated to the LEFT. Normal heart size post CABG. Mediastinal contours and pulmonary vascularity normal. Atherosclerotic calcification aorta. LEFT basilar atelectasis. Chronic accentuation of  interstitial markings unchanged. Tiny LEFT pleural effusion. No acute infiltrate or pneumothorax. IMPRESSION: New LEFT basilar atelectasis and tiny LEFT pleural effusion. Underlying chronic lung disease changes. Aortic Atherosclerosis (ICD10-I70.0). Electronically Signed   By: Lavonia Dana M.D.   On: 01/28/2020 08:44    EKG: Independently reviewed.  Accelerated junctional rhythm LVH   Assessment/Plan Principal Problem:   Hyponatremia Active Problems:   COPD (chronic obstructive pulmonary disease) (HCC)   Acute on chronic renal failure (HCC)   Sepsis secondary to UTI ([place])   Presence of urostomy (HCC)   DVT (deep venous thrombosis) (HCC)     Sepsis secondary to UTI (POA) Patient was hypotensive with systolic blood pressure of 40mHg, he was tachycardic but afebrile He has a white cell count of 14,000 and a normal lactic acid level He has pyuria Prior urine culture yielded ESBL Klebsiella pneumonia and Providencia Alcalifaciens We will treat patient empirically with meropenem while awaiting results of urine culture   Acute on chronic kidney injury Most likely secondary to ATN from hypotension At baseline patient has a serum creatinine of 2.86 and today on admission his serum creatinine is 4.06  Hold beta-blockers and furosemide Gentle IV fluid hydration Consult nephrology   Diabetes mellitus with complications of stage III chronic kidney disease and hypoglycemia Patient is on sulfonylurea which is currently on hold Blood sugar was 20 and patient received an amp of D50 IV fluid hydration with dextrose containing fluids Check blood sugars every 4 hours   Acute left lower extremity DVT Patient is status post recent left hip surgery and was discharged home on Lovenox which he was noncompliant with He now has an acute left lower extremity DVT Place patient on a heparin drip Monitor closely for bleeding   Hyponatremia Most likely check secondary to hypovolemia Gentle IV  fluid hydration Repeat electrolytes in a.m. IVF   COPD Continue as needed bronchodilator therapy    Anemia of chronic kidney disease H&H is stable Monitor closely during this hospitalization   DVT prophylaxis: Heparin Code Status: Full code Family Communication: Greater than 50% of time was spent discussing plan of care with patient at the bedside.  All questions and concerns  have been addressed.  He verbalizes understanding and agrees to the plan. Disposition Plan: Back to previous home environment Consults called: Nephrology     Collier Bullock MD [facility]     01/28/2020, 4:51 PM

## 2020-01-28 NOTE — ED Notes (Signed)
Pt prompted to drink more of his OJ. Pt ate crackers and peanut butter. 200cc dark urine removed from urostomy bag.

## 2020-01-28 NOTE — ED Notes (Signed)
Lab called to send covid swab

## 2020-01-28 NOTE — ED Notes (Addendum)
Pt fell 2 weeks ago and broke left hip - Pt was released from hospital to go home - Pt spouse states that he was given "blood thinner" and started having blood in his urine - Spouse states that last pm pt made very little urine and what he made had gross hematuria - Pt would like his kidneys checked (pt has a urostomy bag since 2013)  Spouse reports that pt has "blisters" to sacral region that she is unable to heal

## 2020-01-28 NOTE — ED Notes (Addendum)
BP continues to fluctuate. When asked, pt reports feels a little dizzy. About 100cc left of bolus.

## 2020-01-28 NOTE — ED Notes (Signed)
Pt changed by this RN at this time. Patient brief clean and dry, no further needs noted. Will continue to monitor.

## 2020-01-28 NOTE — ED Notes (Signed)
NP notified of elevated HR .

## 2020-01-28 NOTE — ED Notes (Signed)
US at bedside

## 2020-01-28 NOTE — ED Provider Notes (Signed)
[facility] Emergency Department Provider Note ____________________________________________   First MD Initiated Contact with Patient 01/28/20 [phone]     (approximate)  I have reviewed the triage vital signs and the nursing notes.   HISTORY  Chief Complaint Weakness    HPI Bryan Brewer is a 79 y.o. male with PMH as noted below who presents with generalized weakness over the last few days, gradual onset, and associated with blood in the urine and decreased urine output (patient has a urostomy).  He also reports some increased shortness of breath.  He denies any fever or chills, vomiting or increased stool output, or any acute pain.   Past Medical History:  Diagnosis Date  . (HFpEF) heart failure with preserved ejection fraction ([place])    a. 03/2019 Echo: EF 60-65%, nl RV fxn.  Marland Kitchen AAA (abdominal aortic aneurysm) ([place]) 09/2019   3.0 infrarenal AAA incidentally noted on abdominal/pelvic CT  . Bladder cancer ([place])   . C. difficile colitis 04/2019  . CKD (chronic kidney disease), stage IV ([place])   . COPD (chronic obstructive pulmonary disease) ([place])   . Coronary artery disease    a. 10/2017 NSTEMI/Cath: LM 67m 75d, LAD 75ost, 90p, D1 100, LCX 720m, OM1 90, RCA 25p; b. 10/2017 CABG x 4 ([facility]): LIMA->LAD, VG->Diag, VG->OM1, VG->OM2.  . Hyperlipidemia   . Hypertension   . NSVT (nonsustained ventricular tachycardia) (HCWorden   a. 06/2019 Zio: 2 runs of NSVT up to 12 beats, max rate 187 bpm.  . PSVT (paroxysmal supraventricular tachycardia) (HCPort Vincent   06/2019 Zio: 1820 episodes of SVT lasting up to 00:01:16 w/ max rate of 210.  . Type 2 diabetes mellitus ([facility]    Patient Active Problem List   Diagnosis Date Noted  . Presence of urostomy (HCNew Athens06/05/2020  . Closed left hip fracture, initial encounter (HCHoyt Lakes06/04/2020  . Accidental fall 01/13/2020  . Preoperative clearance 01/13/2020  . Hyperkalemia 12/17/2019  . Abdominal pain 09/18/2019  . Iron deficiency  anemia 07/14/2019  . PSVT (paroxysmal supraventricular tachycardia) (HCGolden Meadow11/21/2020  . Aortic atherosclerosis (HCFort [place]11/05/2019  . Paroxysmal atrial fibrillation (HCMillis-Clicquot11/04/2019  . Bladder cancer (HCBoyle10/27/2020  . BPH (benign prostatic hyperplasia) 06/02/2019  . Anemia 05/21/2019  . Chronic heart failure with preserved ejection fraction (HFpEF) (HCHudsonville10/03/2019  . Coronary artery disease involving native coronary artery of native heart without angina pectoris 05/14/2019  . Cellulitis of lower extremity 05/14/2019  . Skin ulcer of calf, limited to breakdown of skin (HCArapahoe10/03/2019  . Hypoglycemia 04/29/2019  . Hypotension due to hypovolemia 04/29/2019  . Tachycardia   . GIB (gastrointestinal bleeding) 04/20/2019  . Pressure injury of skin 03/31/2019  . HCAP (healthcare-associated pneumonia) 03/29/2019  . UTI (urinary tract infection) 03/29/2019  . Severe sepsis with septic shock (HCEstill08/23/2020  . Hyponatremia 03/29/2019  . Acute on chronic renal failure (HCDeltana07/06/2019  . Osteoarthritis of knee 01/06/2019  . Chronic systolic CHF (congestive heart failure) (HCBellaire04/16/2020  . Primary osteoarthritis involving multiple joints 08/02/2018  . Presence of aortocoronary bypass graft 11/08/2017  . NSTEMI (non-ST elevated myocardial infarction) (HCGlendive03/27/2019  . Diabetes (HCFortville03/27/2019  . CKD (chronic kidney disease) stage 4, GFR 15-29 ml/min (HCC) 10/30/2017  . HTN (hypertension) 10/30/2017  . HLD (hyperlipidemia) 10/30/2017  . COPD (chronic obstructive pulmonary disease) (HCPablo03/27/2019  . Spinal stenosis of lumbar region 08/20/2017  . Lumbar radiculopathy 08/09/2017  . Hx of bladder cancer 04/30/2016  . Pure hypercholesterolemia 09/08/2015  .  Simple chronic bronchitis ([place]) 09/08/2015  . Hypomagnesemia 03/30/2015  . Benign essential hypertension 03/30/2015  . Abdominal wall hernia at previous stoma site 01/12/2013  . Diarrhea in adult patient 01/12/2013    Past Surgical  History:  Procedure Laterality Date  . COLONOSCOPY    . CORONARY ARTERY BYPASS GRAFT  2019   [facility]; LIMA-LAD, SVG-diagonal, SVG-OM1, and SVG-OM2  . ESOPHAGOGASTRODUODENOSCOPY N/A 04/23/2019   Procedure: ESOPHAGOGASTRODUODENOSCOPY (EGD);  Surgeon: Jonathon Bellows, MD;  Location: [facility] ENDOSCOPY;  Service: Gastroenterology;  Laterality: N/A;  . ESOPHAGOGASTRODUODENOSCOPY (EGD) WITH PROPOFOL N/A 04/21/2019   Procedure: ESOPHAGOGASTRODUODENOSCOPY (EGD) WITH PROPOFOL;  Surgeon: Jonathon Bellows, MD;  Location[facility] ENDOSCOPY;  Service: Gastroenterology;  Laterality: N/A;  . INTRAMEDULLARY (IM) NAIL INTERTROCHANTERIC Left 01/14/2020   Procedure: INTRAMEDULLARY (IM) NAIL INTERTROCHANTRIC;  Surgeon: Corky Mull, MD;  Location: [facility] ORS;  Service: Orthopedics;  Laterality: Left;  . LEFT HEART CATH AND CORONARY ANGIOGRAPHY N/A 10/31/2017   Procedure: LEFT HEART CATH AND CORONARY ANGIOGRAPHY;  Surgeon: Yolonda Kida, MD;  Location: [place] CV LAB;  Service: Cardiovascular;  Laterality: N/A;  . REVISION UROSTOMY CUTANEOUS      Prior to Admission medications   Medication Sig Start Date End Date Taking? Authorizing Provider  acetaminophen (TYLENOL) 500 MG tablet Take 2 tablets (1,000 mg total) by mouth 3 (three) times daily as needed for mild pain or moderate pain (pain score 1-3 or temp > 100.5). 01/16/20   Enzo Bi, MD  aspirin EC 81 MG tablet Take 81 mg by mouth every evening.     [provider]  atorvastatin (LIPITOR) 80 MG tablet Take 80 mg by mouth at bedtime.    [provider]  blood glucose meter kit and supplies KIT Dispense based on patient and insurance preference. Use up to four times daily as directed. (FOR ICD-9 250.00, 250.01). 04/04/19   Gladstone Lighter, MD  Darbepoetin Alfa (ARANESP) 40 MCG/0.4ML SOSY injection Inject 0.25 mLs (25 mcg total) into the skin every 7 (seven) days. 01/26/20   Lorella Nimrod, MD  enoxaparin (LOVENOX) 40 MG/0.4ML injection Inject 0.4 mLs (40 mg  total) into the skin daily for 14 doses. 01/16/20 01/30/20  Enzo Bi, MD  ferrous sulfate 325 (65 FE) MG EC tablet Take 1 tablet (325 mg total) by mouth 2 (two) times daily. 01/19/20 01/18/21  Lorella Nimrod, MD  furosemide (LASIX) 20 MG tablet Hold until outpatient doctor followup due to low normal BP. 01/16/20   Enzo Bi, MD  glipiZIDE (GLUCOTROL) 5 MG tablet Take 0.5 tablets (2.5 mg total) by mouth daily before breakfast. 05/01/19 06/30/28  Henreitta Leber, MD  ipratropium-albuterol (DUONEB) 0.5-2.5 (3) MG/3ML SOLN Take 3 mLs by nebulization every 6 (six) hours as needed (wheezing, shortness of breath). 04/04/19   Gladstone Lighter, MD  metoprolol tartrate (LOPRESSOR) 25 MG tablet Take 1 tablet (25 mg total) by mouth 2 (two) times daily. 06/26/19 01/14/20  End, Harrell Gave, MD  pantoprazole (PROTONIX) 40 MG tablet Take 1 tablet (40 mg total) by mouth daily. 04/25/19 04/24/20  Dustin Flock, MD  sodium bicarbonate 650 MG tablet Take 1,300 mg by mouth 3 (three) times daily.    [provider]  traMADol (ULTRAM) 50 MG tablet Take 1 tablet (50 mg total) by mouth every 6 (six) hours as needed for moderate pain. 01/16/20   Enzo Bi, MD    Allergies Patient has no known allergies.  Family History  Problem Relation Age of Onset  . CAD Mother   . Stroke Mother   .  CAD Father   . Stroke Father   . Breast cancer Sister     Social History Social History   Tobacco Use  . Smoking status: Former Smoker    Packs/day: 1.50    Years: 35.00    Pack years: 52.50    Types: Cigarettes    Quit date: 2013    Years since quitting: 8.4  . Smokeless tobacco: Never Used  Vaping Use  . Vaping Use: Never used  Substance Use Topics  . Alcohol use: No  . Drug use: Never    Review of Systems  Constitutional: No fever.  Positive for weakness. Eyes: No redness. ENT: No sore throat. Cardiovascular: Denies chest pain. Respiratory: Positive for shortness of breath. Gastrointestinal: No vomiting or  diarrhea.  Genitourinary: Positive for hematuria. Musculoskeletal: Negative for back pain. Skin: Negative for rash. Neurological: Negative for headache.   ____________________________________________   PHYSICAL EXAM:  VITAL SIGNS: ED Triage Vitals  Enc Vitals Group     BP 01/28/20 0415 (!) 104/50     Pulse Rate 01/28/20 0415 (!) 118     Resp 01/28/20 0415 (!) 22     Temp 01/28/20 0415 98.5 F (36.9 C)     Temp Source 01/28/20 0415 Oral     SpO2 01/28/20 0415 100 %     Weight 01/28/20 0417 149 lb (67.6 kg)     Height 01/28/20 0417 5' 7"  (1.702 m)     Head Circumference --      Peak Flow --      Pain Score 01/28/20 0417 0     Pain Loc --      Pain Edu? --      Excl. in [place]? --     Constitutional: Alert and oriented.  Somewhat weak appearing but in no acute distress. Eyes: Conjunctivae are normal.  Head: Atraumatic. Nose: No congestion/rhinnorhea. Mouth/Throat: Mucous membranes are dry. Neck: Normal range of motion.  Cardiovascular: Normal rate, regular rhythm. Grossly normal heart sounds.  Good peripheral circulation. Respiratory: Normal respiratory effort.  No retractions. Lungs CTAB. Gastrointestinal: Soft and nontender. No distention.  Genitourinary: No flank tenderness. Musculoskeletal: 2+ bilateral lower extremity edema.  Extremities warm and well perfused.  Neurologic:  Normal speech and language. No gross focal neurologic deficits are appreciated.  Skin:  Skin is warm and dry. No rash noted. Psychiatric: Mood and affect are normal. Speech and behavior are normal.  ____________________________________________   LABS (all labs ordered are listed, but only abnormal results are displayed)  Labs Reviewed  CBC WITH DIFFERENTIAL/PLATELET - Abnormal; Notable for the following components:      Result Value   WBC 14.5 (*)    RBC 3.02 (*)    Hemoglobin 8.2 (*)    HCT 26.5 (*)    Neutro Abs 13.4 (*)    Lymphs Abs 0.4 (*)    Abs Immature Granulocytes 0.26 (*)     All other components within normal limits  COMPREHENSIVE METABOLIC PANEL - Abnormal; Notable for the following components:   Sodium 123 (*)    Chloride 92 (*)    CO2 17 (*)    Glucose, Bld 52 (*)    BUN 101 (*)    Creatinine, Ser 4.06 (*)    Calcium 7.5 (*)    Albumin 2.1 (*)    Alkaline Phosphatase 138 (*)    GFR calc non Af Amer 13 (*)    GFR calc Af Amer 15 (*)    All other components within normal limits  URINALYSIS, COMPLETE (UACMP) WITH MICROSCOPIC - Abnormal; Notable for the following components:   Color, Urine YELLOW (*)    APPearance CLOUDY (*)    Hgb urine dipstick SMALL (*)    Protein, ur 100 (*)    Leukocytes,Ua MODERATE (*)    All other components within normal limits  SARS CORONAVIRUS 2 BY RT PCR (HOSPITAL ORDER, [place] [facility])  LACTIC ACID, PLASMA  LACTIC ACID, PLASMA  TROPONIN I (HIGH SENSITIVITY)   ____________________________________________  EKG  ED ECG REPORT I, Arta Silence, the attending physician, personally viewed and interpreted this ECG.  Date: 01/28/2020 EKG Time: 0418 Rate: 120 Rhythm: Sinus tachycardia QRS Axis: normal Intervals: normal ST/T Wave abnormalities: LVH with repolarization abnormality Narrative Interpretation: no evidence of acute ischemia  ____________________________________________  RADIOLOGY  CXR: Left basilar atelectasis and pleural effusion  ____________________________________________   PROCEDURES  Procedure(s) performed: No  Procedures  Critical Care performed: No ____________________________________________   INITIAL IMPRESSION / ASSESSMENT AND PLAN / ED COURSE  Pertinent labs & imaging results that were available during my care of the patient were reviewed by me and considered in my medical decision making (see chart for details).  79 year old male with PMH as noted above including CKD, CHF, and COPD presents with worsening generalized weakness as well as some hematuria  over the last few days.  I reviewed the past medical records in [place].  The patient was admitted earlier this month and discharged on 6/15 after a fall resulting in a hip fracture which was operated on.  He was discharged with Lovenox injections.  On exam, the patient is somewhat weak and chronically ill-appearing but in no acute distress.  He is tachycardic with a borderline low blood pressure but otherwise normal vital signs.  He has 2+ bilateral lower extremity edema.  The abdomen is soft nontender.  The remainder of the exam is as described above.  Initial lab work-up obtained from triage is significant for hyponatremia, worsened creatinine, and leukocytosis of 14.5.  Given this and his vital signs I am somewhat concerned for infection/sepsis, although electrolyte abnormalities may be contributing to or primarily causing his weakness.  We will obtain a chest x-ray, urinalysis, lactate, give a fluid bolus, and reassess.  I anticipate admission.  ----------------------------------------- 3:49 PM on 01/28/2020 -----------------------------------------  Urinalysis shows some WBCs and RBCs, but no bacteria.  I overall have low suspicion for active UTI.  The lactic acid is normal.  There is no evidence of sepsis.  Given the patient's generalized weakness and hyponatremia, I proceeded with admission.  I discussed the case with Dr. Francine Graven from the hospitalist service.  ____________________________________________   FINAL CLINICAL IMPRESSION(S) / ED DIAGNOSES  Final diagnoses:  Hyponatremia  Weakness      NEW MEDICATIONS STARTED DURING THIS VISIT:  New Prescriptions   No medications on file     Note:  This document was prepared using Dragon voice recognition software and may include unintentional dictation errors.    Arta Silence, MD 01/28/20 1550

## 2020-01-28 NOTE — ED Notes (Signed)
Attempted for 22g at L ac as D5 + 0.9 NS and heparin not tested/incompatible.

## 2020-01-28 NOTE — ED Notes (Signed)
This RN unable to obtain blood work, lab called.

## 2020-01-28 NOTE — ED Notes (Signed)
Wife at bedside.

## 2020-01-28 NOTE — ED Triage Notes (Signed)
Pt to triage via w/c with no distress noted; pt reports weakness and nausea tonight; denies pain or other accomp symptoms

## 2020-01-28 NOTE — ED Notes (Signed)
Attempted to call report. Gave secretary name and number for nurse to call back.

## 2020-01-28 NOTE — ED Notes (Signed)
Pt given food and OJ at this time.

## 2020-01-28 NOTE — ED Notes (Signed)
Pt alert and resting calmly in bed. Denies that he needs to use restroom or that he needs briefs changed.

## 2020-01-29 ENCOUNTER — Encounter: Payer: Self-pay | Admitting: Internal Medicine

## 2020-01-29 DIAGNOSIS — N170 Acute kidney failure with tubular necrosis: Secondary | ICD-10-CM

## 2020-01-29 DIAGNOSIS — N184 Chronic kidney disease, stage 4 (severe): Secondary | ICD-10-CM

## 2020-01-29 DIAGNOSIS — N390 Urinary tract infection, site not specified: Secondary | ICD-10-CM

## 2020-01-29 DIAGNOSIS — N179 Acute kidney failure, unspecified: Secondary | ICD-10-CM

## 2020-01-29 DIAGNOSIS — I959 Hypotension, unspecified: Secondary | ICD-10-CM

## 2020-01-29 DIAGNOSIS — I484 Atypical atrial flutter: Secondary | ICD-10-CM

## 2020-01-29 DIAGNOSIS — N181 Chronic kidney disease, stage 1: Secondary | ICD-10-CM

## 2020-01-29 DIAGNOSIS — A419 Sepsis, unspecified organism: Principal | ICD-10-CM

## 2020-01-29 DIAGNOSIS — I5032 Chronic diastolic (congestive) heart failure: Secondary | ICD-10-CM

## 2020-01-29 LAB — BASIC METABOLIC PANEL
Anion gap: 12 (ref 5–15)
BUN: 101 mg/dL — ABNORMAL HIGH (ref 8–23)
CO2: 17 mmol/L — ABNORMAL LOW (ref 22–32)
Calcium: 7.3 mg/dL — ABNORMAL LOW (ref 8.9–10.3)
Chloride: 98 mmol/L (ref 98–111)
Creatinine, Ser: 3.89 mg/dL — ABNORMAL HIGH (ref 0.61–1.24)
GFR calc Af Amer: 16 mL/min — ABNORMAL LOW (ref >60)
GFR calc non Af Amer: 14 mL/min — ABNORMAL LOW (ref >60)
Glucose, Bld: 83 mg/dL (ref 70–99)
Potassium: 5.1 mmol/L (ref 3.5–5.1)
Sodium: 127 mmol/L — ABNORMAL LOW (ref 135–145)

## 2020-01-29 LAB — MRSA PCR SCREENING: MRSA by PCR: NEGATIVE

## 2020-01-29 LAB — CBC
HCT: 23.3 % — ABNORMAL LOW (ref 39.0–52.0)
Hemoglobin: 7.5 g/dL — ABNORMAL LOW (ref 13.0–17.0)
MCH: 26.9 pg (ref 26.0–34.0)
MCHC: 32.2 g/dL (ref 30.0–36.0)
MCV: 83.5 fL (ref 80.0–100.0)
Platelets: 242 10*3/uL (ref 150–400)
RBC: 2.79 MIL/uL — ABNORMAL LOW (ref 4.22–5.81)
RDW: 14.8 % (ref 11.5–15.5)
WBC: 11 10*3/uL — ABNORMAL HIGH (ref 4.0–10.5)
nRBC: 0 % (ref 0.0–0.2)

## 2020-01-29 LAB — OSMOLALITY: Osmolality: 292 mOsm/kg (ref 275–295)

## 2020-01-29 LAB — HEPARIN LEVEL (UNFRACTIONATED)
Heparin Unfractionated: 0.32 IU/mL (ref 0.30–0.70)
Heparin Unfractionated: 0.42 IU/mL (ref 0.30–0.70)

## 2020-01-29 LAB — GLUCOSE, CAPILLARY
Glucose-Capillary: 85 mg/dL (ref 70–99)
Glucose-Capillary: 85 mg/dL (ref 70–99)
Glucose-Capillary: 74 mg/dL (ref 70–99)
Glucose-Capillary: 118 mg/dL — ABNORMAL HIGH (ref 70–99)
Glucose-Capillary: 189 mg/dL — ABNORMAL HIGH (ref 70–99)

## 2020-01-29 LAB — TSH: TSH: 1.818 u[IU]/mL (ref 0.350–4.500)

## 2020-01-29 LAB — MAGNESIUM: Magnesium: 1.8 mg/dL (ref 1.7–2.4)

## 2020-01-29 MED ORDER — DEXTROSE 5 % AND 0.9 % NACL IV BOLUS
250.0000 mL | Freq: Once | INTRAVENOUS | Status: AC
  Administered 2020-01-29: 250 mL via INTRAVENOUS

## 2020-01-29 MED ORDER — ACETAMINOPHEN 500 MG PO TABS: 1000.0000 mg | ORAL_TABLET | Freq: Three times a day (TID) | ORAL | Status: DC | PRN

## 2020-01-29 MED ORDER — AMIODARONE HCL IN DEXTROSE 360-4.14 MG/200ML-% IV SOLN
INTRAVENOUS | Status: AC
  Administered 2020-01-29: 150 mg via INTRAVENOUS
  Filled 2020-01-29: qty 200

## 2020-01-29 MED ORDER — CHLORHEXIDINE GLUCONATE CLOTH 2 % EX PADS
6.0000 | MEDICATED_PAD | Freq: Every day | CUTANEOUS | Status: DC
  Administered 2020-01-29 – 2020-02-02 (×4): 6 via TOPICAL

## 2020-01-29 MED ORDER — MIDODRINE HCL 5 MG PO TABS: 10.0000 mg | ORAL_TABLET | Freq: Three times a day (TID) | ORAL | Status: DC

## 2020-01-29 MED ORDER — AMIODARONE HCL IN DEXTROSE 360-4.14 MG/200ML-% IV SOLN
60.0000 mg/h | INTRAVENOUS | Status: DC
  Administered 2020-01-29 (×2): 60 mg/h via INTRAVENOUS
  Filled 2020-01-29: qty 200

## 2020-01-29 MED ORDER — MAGNESIUM SULFATE 2 GM/50ML IV SOLN
2.0000 g | Freq: Once | INTRAVENOUS | Status: AC
  Administered 2020-01-29: 2 g via INTRAVENOUS
  Filled 2020-01-29: qty 50

## 2020-01-29 MED ORDER — METOPROLOL TARTRATE 25 MG PO TABS
25.0000 mg | ORAL_TABLET | Freq: Two times a day (BID) | ORAL | Status: DC
  Administered 2020-01-29 – 2020-02-01 (×5): 25 mg via ORAL
  Filled 2020-01-29 (×7): qty 1

## 2020-01-29 MED ORDER — MIDODRINE HCL 5 MG PO TABS
10.0000 mg | ORAL_TABLET | Freq: Three times a day (TID) | ORAL | Status: DC
  Administered 2020-01-29 – 2020-02-01 (×10): 10 mg via ORAL
  Filled 2020-01-29 (×9): qty 2

## 2020-01-29 MED ORDER — AMIODARONE HCL IN DEXTROSE 360-4.14 MG/200ML-% IV SOLN
30.0000 mg/h | INTRAVENOUS | Status: DC
  Administered 2020-01-29 – 2020-02-02 (×8): 30 mg/h via INTRAVENOUS
  Filled 2020-01-29 (×8): qty 200

## 2020-01-29 MED ORDER — AMIODARONE LOAD VIA INFUSION
150.0000 mg | Freq: Once | INTRAVENOUS | Status: AC
  Filled 2020-01-29: qty 83.34

## 2020-01-29 NOTE — Progress Notes (Signed)
Update provided to pt's wife, Bryan Brewer.  She is now aware that the pt has transferred to [place].

## 2020-01-29 NOTE — Consult Note (Signed)
Cardiology Consultation:   Patient ID: Bryan Brewer; [id number]; [birth year]   Admit date: 01/28/2020 Date of Consult: 01/29/2020  Primary Care Provider: Tracie Harrier, MD Primary Cardiologist: End Primary Electrophysiologist:  None   Patient Profile:   Bryan Brewer is a 79 y.o. male with a hx of CAD with NSTEMI in 10/2017 s/p 4-vessel CABG at [facility] (LIMA-LAD, SVG-diag, SVG-OM1, SVG-OM2), chronic combined systolic and diastolic CHF with subsequent normalization of LVSF by echo in 03/2019, ICM, paroxysmal SVT, AAA, DM2, CKD stage IV, anemia of chronic disease, HTN, HLD, COPD, recent mechanical fall s/p intramedullary nail placement on 01/14/2020, C. difficile colitis, GI bleed, and bladder cancer who is being seen today for the evaluation of atrial flutter with RVR at the request of Dr. Ree Kida.  History of Present Illness:   Bryan Brewer was previously followed by [facility] cardiology.  He was admitted to the hospital in 10/2017 with a NSTEMI.  LHC at that time demonstrated multivessel CAD as outlined below.  Echo at that time showed a reduced LV SF with an EF of 35 to 40%, hypokinesis of the anteroseptal myocardium and anterior myocardium with akinesis of the apical myocardium as well as mild mitral regurgitation.  He was subsequently transferred to [facility] where he underwent four-vessel CABG as outlined above.  Follow-up echo in 03/2019 showed an improved LV SF with an EF of 60 to 65%, normal LV diastolic function, normal RV systolic function and ventricular cavity size, mild mitral regurgitation.  He was admitted to the hospital in 04/2019 with rectal bleeding, melena, and hematemesis with drop in hemoglobin to 5.5.  During EGD, with intralesional epinephrine he developed narrow complex tachycardia into the 170s bpm.  Subsequent outpatient cardiac monitoring showed multiple episodes of SVT (totaling 1820 episodes) lasting up to 1 minute and 16 seconds as well as 2 brief runs of NSVT.  He has been seen  several times for volume overload.  He was recently admitted to the hospital from 6/9-6/15/2021 with a mechanical fall while getting out of his truck.  He sustained a left hip fracture which was treated with intramedullary nail placement by orthopedic surgery.  Postoperatively he did receive 1 unit of PRBC and IV iron.  He was discharged on prophylactic dose Lovenox, though appears to taking this.  He presented to [facility] on 6/24 with weakness, nausea, and hematuria.  Labs demonstrated acute on chronic CKD with admission serum creatinine of 4.06 with a baseline around 2.8-3.2, hyponatremia 123 trending to 127, potassium 4.7 trending to 5.1, leukocytosis 14.5 trending to 11.0, hemoglobin 8.2 trending to 7.5, UA with pyuria, INR 1.5, COVID-19 negative, high-sensitivity troponin 11, albumin 2.1, AST/ALT normal.  Initial EKG showed narrow complex tachycardia, possibly atrial tachycardia, 120 bpm, nonspecific ST-T changes.  Chest x-ray without acute findings.  There was some left lower extremity swelling with lower extremity venous duplex being positive for an acute nonocclusive DVT within the left popliteal vein.  He was started on empiric meropenem as well as heparin drip.  Overnight, with heart rates into the 120s to 130s bpm he was given p.o. metoprolol 25 mg x 1 without much improvement in ventricular rates.  In this setting he was started on amiodarone drip.  BP has been soft in the 70s to low 757V systolic.  He denies any chest pain, dyspnea, or palpitations.  Review of EKG this morning shows atrial flutter with RVR with 2-1 AV conduction with ventricular rate of 131 bpm, nonspecific ST-T changes.  His  main complaint is left hip pain.   Past Medical History:  Diagnosis Date  . (HFpEF) heart failure with preserved ejection fraction ([place])    a. 03/2019 Echo: EF 60-65%, nl RV fxn.  Marland Kitchen AAA (abdominal aortic aneurysm) ([place]) 09/2019   3.0 infrarenal AAA incidentally noted on abdominal/pelvic CT  . Bladder cancer  ([place])   . C. difficile colitis 04/2019  . CHF (congestive heart failure) ([place])   . CKD (chronic kidney disease), stage IV ([place])   . COPD (chronic obstructive pulmonary disease) ([place])   . Coronary artery disease    a. 10/2017 NSTEMI/Cath: LM 48m 75d, LAD 75ost, 90p, D1 100, LCX 787m, OM1 90, RCA 25p; b. 10/2017 CABG x 4 ([facility]): LIMA->LAD, VG->Diag, VG->OM1, VG->OM2.  . Hyperlipidemia   . Hypertension   . NSVT (nonsustained ventricular tachycardia) ([place]   a. 06/2019 Zio: 2 runs of NSVT up to 12 beats, max rate 187 bpm.  . PSVT (paroxysmal supraventricular tachycardia) ([place]   06/2019 Zio: 1820 episodes of SVT lasting up to 00:01:16 w/ max rate of 210.  . Type 2 diabetes mellitus ([place]    Past Surgical History:  Procedure Laterality Date  . COLONOSCOPY    . CORONARY ARTERY BYPASS GRAFT  2019   [facility]; LIMA-LAD, SVG-diagonal, SVG-OM1, and SVG-OM2  . ESOPHAGOGASTRODUODENOSCOPY N/A 04/23/2019   Procedure: ESOPHAGOGASTRODUODENOSCOPY (EGD);  Surgeon: AnJonathon BellowsMD;  [facility];  Service: Gastroenterology;  Laterality: N/A;  . ESOPHAGOGASTRODUODENOSCOPY (EGD) WITH PROPOFOL N/A 04/21/2019   Procedure: ESOPHAGOGASTRODUODENOSCOPY (EGD) WITH PROPOFOL;  Surgeon: AnJonathon BellowsMD;  [facility];  Service: Gastroenterology;  Laterality: N/A;  . INTRAMEDULLARY (IM) NAIL INTERTROCHANTERIC Left 01/14/2020   Procedure: INTRAMEDULLARY (IM) NAIL INTERTROCHANTRIC;  Surgeon: PoCorky MullMD;  Location: [facility] ORS;  Service: Orthopedics;  Laterality: Left;  . LEFT HEART CATH AND CORONARY ANGIOGRAPHY N/A 10/31/2017   Procedure: LEFT HEART CATH AND CORONARY ANGIOGRAPHY;  Surgeon: CaYolonda KidaMD;  [facility];  Service: Cardiovascular;  Laterality: N/A;  . REVISION UROSTOMY CUTANEOUS       Home Meds: Prior to Admission medications   Medication Sig Start Date End Date Taking? Authorizing Provider  aspirin EC 81 MG tablet Take 81 mg by mouth every evening.     Yes [provider]  atorvastatin (LIPITOR) 80 MG tablet Take 80 mg by mouth at bedtime.   Yes [provider]  glipiZIDE (GLUCOTROL) 5 MG tablet Take 0.5 tablets (2.5 mg total) by mouth daily before breakfast. 05/01/19 06/30/28 Yes Sainani, ViBelia HemanMD  metoprolol tartrate (LOPRESSOR) 25 MG tablet Take 1 tablet (25 mg total) by mouth 2 (two) times daily. 06/26/19 01/28/20 Yes End, ChHarrell GaveMD  pantoprazole (PROTONIX) 40 MG tablet Take 1 tablet (40 mg total) by mouth daily. 04/25/19 04/24/20 Yes PaDustin FlockMD  sodium bicarbonate 650 MG tablet Take 1,300 mg by mouth 3 (three) times daily.   Yes [provider]  acetaminophen (TYLENOL) 500 MG tablet Take 2 tablets (1,000 mg total) by mouth 3 (three) times daily as needed for mild pain or moderate pain (pain score 1-3 or temp > 100.5). 01/16/20   LaEnzo BiMD  blood glucose meter kit and supplies KIT Dispense based on patient and insurance preference. Use up to four times daily as directed. (FOR ICD-9 250.00, 250.01). 04/04/19   KaGladstone LighterMD  Darbepoetin Alfa (ARANESP) 40 MCG/0.4ML SOSY injection Inject 0.25 mLs (25 mcg total) into the skin every 7 (seven) days.  01/26/20   Lorella Nimrod, MD  enoxaparin (LOVENOX) 40 MG/0.4ML injection Inject 0.4 mLs (40 mg total) into the skin daily for 14 doses. Patient not taking: Reported on 01/28/2020 01/16/20 01/30/20  Enzo Bi, MD  ferrous sulfate 325 (65 FE) MG EC tablet Take 1 tablet (325 mg total) by mouth 2 (two) times daily. Patient not taking: Reported on 01/28/2020 01/19/20 01/18/21  Lorella Nimrod, MD  furosemide (LASIX) 20 MG tablet Hold until outpatient doctor followup due to low normal BP. Patient not taking: Reported on 01/28/2020 01/16/20   Enzo Bi, MD  ipratropium-albuterol (DUONEB) 0.5-2.5 (3) MG/3ML SOLN Take 3 mLs by nebulization every 6 (six) hours as needed (wheezing, shortness of breath). 04/04/19   Gladstone Lighter, MD  traMADol (ULTRAM) 50 MG tablet Take 1  tablet (50 mg total) by mouth every 6 (six) hours as needed for moderate pain. 01/16/20   Enzo Bi, MD    Inpatient Medications: Scheduled Meds: . aspirin EC  81 mg Oral QPM  . atorvastatin  80 mg Oral QHS  . ferrous sulfate  325 mg Oral BID  . metoprolol tartrate  25 mg Oral BID  . midodrine  10 mg Oral TID WC  . pantoprazole  40 mg Oral Daily  . sodium bicarbonate  1,300 mg Oral TID  . sodium chloride flush  3 mL Intravenous Q12H   Continuous Infusions: . sodium chloride    . amiodarone 60 mg/hr (01/29/20 6294)   Followed by  . amiodarone    . dextrose 5 % and 0.9% NaCl    . dextrose 5 % and 0.9% NaCl 75 mL/hr at 01/29/20 0400  . heparin 1,100 Units/hr (01/29/20 0400)  . meropenem (MERREM) IV Stopped (01/28/20 2205)   PRN Meds: sodium chloride, acetaminophen, ipratropium-albuterol, ondansetron **OR** ondansetron (ZOFRAN) IV, sodium chloride flush, traMADol  Allergies:  No Known Allergies  Social History:   Social History   Socioeconomic History  . Marital status: Married    Spouse name: Not on file  . Number of children: Not on file  . Years of education: Not on file  . Highest education level: Not on file  Occupational History  . Not on file  Tobacco Use  . Smoking status: Former Smoker    Packs/day: 1.50    Years: 35.00    Pack years: 52.50    Types: Cigarettes    Quit date: 2013    Years since quitting: 8.4  . Smokeless tobacco: Never Used  Vaping Use  . Vaping Use: Never used  Substance and Sexual Activity  . Alcohol use: No  . Drug use: Never  . Sexual activity: Not Currently  Other Topics Concern  . Not on file  Social History Narrative  . Not on file   Social Determinants of Health   Financial Resource Strain: Low Risk   . Difficulty of Paying Living Expenses: Not hard at all  Food Insecurity: Unknown  . Worried About Charity fundraiser in the Last Year: Patient refused  . Ran Out of Food in the Last Year: Patient refused  Transportation  Needs: Unknown  . Lack of Transportation (Medical): Patient refused  . Lack of Transportation (Non-Medical): Patient refused  Physical Activity: Unknown  . Days of Exercise per Week: Patient refused  . Minutes of Exercise per Session: Patient refused  Stress: Unknown  . Feeling of Stress : Patient refused  Social Connections: Unknown  . Frequency of Communication with Friends and Family: Patient refused  . Frequency of  Social Gatherings with Friends and Family: Patient refused  . Attends Religious Services: Patient refused  . Active Member of Clubs or Organizations: Patient refused  . Attends Archivist Meetings: Patient refused  . Marital Status: Patient refused  Intimate Partner Violence: Unknown  . Fear of Current or Ex-Partner: Patient refused  . Emotionally Abused: Patient refused  . Physically Abused: Patient refused  . Sexually Abused: Patient refused     Family History:   Family History  Problem Relation Age of Onset  . CAD Mother   . Stroke Mother   . CAD Father   . Stroke Father   . Breast cancer Sister     ROS:  Review of Systems  Constitutional: Positive for malaise/fatigue. Negative for chills, diaphoresis, fever and weight loss.  HENT: Negative for congestion.   Eyes: Negative for discharge and redness.  Respiratory: Negative for cough, hemoptysis, sputum production, shortness of breath and wheezing.   Cardiovascular: Negative for chest pain, palpitations, orthopnea, claudication, leg swelling and PND.  Gastrointestinal: Negative for abdominal pain, blood in stool, heartburn, melena, nausea and vomiting.  Genitourinary: Negative for hematuria.  Musculoskeletal: Positive for joint pain. Negative for falls and myalgias.       Left hip pain  Skin: Negative for rash.  Neurological: Positive for weakness. Negative for dizziness, tingling, tremors, sensory change, speech change, focal weakness and loss of consciousness.  Endo/Heme/Allergies: Does not  bruise/bleed easily.  Psychiatric/Behavioral: Negative for substance abuse. The patient is not nervous/anxious.   All other systems reviewed and are negative.     Physical Exam/Data:   Vitals:   01/29/20 0749 01/29/20 0752 01/29/20 0819 01/29/20 0820  BP: 113/86 113/86 (!) 71/52 (!) 88/41  Pulse: (!) 124 (!) 120 (!) 125 (!) 126  Resp:  _0 Temp:  98.3 F (36.8 C)    TempSrc:  Axillary    SpO2: 100% 100% 100% 100%  Weight:      Height:        Intake/Output Summary (Last 24 hours) at 01/29/2020 0832 Last data filed at 01/29/2020 0400 Gross per 24 hour  Intake 256.62 ml  Output 200 ml  Net 56.62 ml   Filed Weights   01/28/20 0417  Weight: 67.6 kg   Body mass index is 23.34 kg/m.   Physical Exam: General: Frail and elderly appearing, in no acute distress. Head: Normocephalic, atraumatic, sclera non-icteric, no xanthomas, nares without discharge.  Neck: Negative for carotid bruits. JVD not elevated. Lungs: Diminished breath sounds bilaterally. Breathing is unlabored. Heart: Tachycardic with S1 S2. No murmurs, rubs, or gallops appreciated. Abdomen: Soft, non-tender, non-distended with normoactive bowel sounds. No hepatomegaly. No rebound/guarding. No obvious abdominal masses. Msk:  Strength and tone appear normal for age. Extremities: No clubbing or cyanosis. Mild left lower extremity swelling and erythema. Distal pedal pulses are 2+ and equal bilaterally. Neuro: Alert and oriented X 3. No facial asymmetry. No focal deficit. Moves all extremities spontaneously. Psych:  Responds to questions appropriately with a normal affect.   EKG:  The EKG was personally reviewed and demonstrates: narrow complex tachycardia, possibly atrial tachycardia, 120 bpm, nonspecific ST-T changes.  Repeat EKG this morning showed - atrial flutter with RVR with 2-1 AV conduction with ventricular rate of 131 bpm, nonspecific ST-T changes. Telemetry:  Telemetry was personally reviewed and  demonstrates: Afib/flutter with RVR with ventricular rates in the 120s to 130s bpm, rare PVCs  Weights: Filed Weights   01/28/20 0417  Weight: 67.6 kg  Relevant CV Studies:  Zio 05/2019:  The patient was monitored for 12 days, 19 hours.  The predominant rhythm was sinus with an average rate of 77 bpm (range 57 to 134 bpm in sinus).  Frequent PACs and occasional PVCs were observed.  There were 1820 episodes of supraventricular tachycardia lasting up to 1:16 minutes with a maximal rate of 210 bpm.  Two episodes of ventricular tachycardia lasting up to 12 beats with a maximal rate of 187 bpm also occurred.  There was no prolonged pause.  Patient triggered episodes corresponded to sinus rhythm with PACs.   Predominantly sinus rhythm with frequent PACs and occasional PVCs.  Frequent PSVT was noted as well as 2 episodes of NSVT. __________  2D echo 03/2019: 1. The left ventricle has normal systolic function with an ejection  fraction of 60-65%. The cavity size was normal. Left ventricular diastolic  parameters were normal.  2. The right ventricle has normal systolic function. The cavity was  normal. There is no increase in right ventricular wall thickness.  3. The aorta is normal unless otherwise noted. __________  LHC 10/2017:  Mid LM lesion is 80% stenosed.  Mid Cx to Dist Cx lesion is 75% stenosed.  Dist LM to Ost LAD lesion is 75% stenosed.  Prox RCA lesion is 25% stenosed.  Prox LAD lesion is 90% stenosed.  Ost 1st Mrg lesion is 90% stenosed.  1st Diag lesion is 100% stenosed.   Conclusion Left heart cath from right groin Left ventriculogram was not performed because of elevated creatinine 80% distal left main 75% ostial LAD 90% mid 100% mid diagonal 1 75% ostial circumflex 90% ostial OM1 75% distal circumflex Small RCA nondominant minimal disease Recommend transfer for possible CABG   Laboratory Data:  Chemistry Recent Labs  Lab 01/28/20 0425  01/29/20 0255  NA 123* 127*  K 4.7 5.1  CL 92* 98  CO2 17* 17*  GLUCOSE 52* 83  BUN 101* 101*  CREATININE 4.06* 3.89*  CALCIUM 7.5* 7.3*  GFRNONAA 13* 14*  GFRAA 15* 16*  ANIONGAP 14 12    Recent Labs  Lab 01/28/20 0425  PROT 6.7  ALBUMIN 2.1*  AST 25  ALT 8  ALKPHOS 138*  BILITOT 1.2   Hematology Recent Labs  Lab 01/22/20 1323 01/28/20 0425 01/29/20 0255  WBC 13.1* 14.5* 11.0*  RBC 3.08* 3.02* 2.79*  HGB 8.4* 8.2* 7.5*  HCT 27.4* 26.5* 23.3*  MCV 89.0 87.7 83.5  MCH 27.3 27.2 26.9  MCHC 30.7 30.9 32.2  RDW 14.6 15.0 14.8  PLT 287 306 242   Cardiac EnzymesNo results for input(s): TROPONINI in the last 168 hours. No results for input(s): TROPIPOC in the last 168 hours.  BNPNo results for input(s): BNP, PROBNP in the last 168 hours.  DDimer No results for input(s): DDIMER in the last 168 hours.  Radiology/Studies:  US Venous Img Lower Unilateral Left (DVT)  Result Date: 01/28/2020 IMPRESSION: Positive for acute nonocclusive DVT within the popliteal vein. These results will be called to the ordering clinician or representative by the Radiologist Assistant, and communication documented in the PACS or [employer]. Electronically Signed   By: Donavan Foil M.D.   On: 01/28/2020 15:32   DG Chest Portable 1 View  Result Date: 01/28/2020 IMPRESSION: New LEFT basilar atelectasis and tiny LEFT pleural effusion. Underlying chronic lung disease changes. Aortic Atherosclerosis (ICD10-I70.0). Electronically Signed   By: Lavonia Dana M.D.   On: 01/28/2020 08:44    Assessment and Plan:  1. New onset atrial flutter/fib with RVR: -Complicated by hypotension as outlined below -BP during cardiology consult 852 systolic, though has continued to have BP readings into the 77O systolic -Continue amiodarone and heparin gtts -Not a good candidate for digoxin given acute on CKD -Unable to add beta blockers or nondihydropyridine calcium channel blockers secondary to  hypotension -If patient becomes unstable he will require urgent DCCV -Check echo -Check TSH -Maintain potassium and magnesium goal 4.0 and 2.0 respectively  2. Hypotension: -Likely multifactorial including possibly sepsis with UTI, atrial flutter/fib with RVR with IV amiodarone for rate control, volume depletion, and anemia -IV fluids -Overall, this is somewhat of a difficult situation as we are limited in rate control to IV amiodarone which is likely playing a role in his underlying hypotension -Midodrine -Transfer to the ICU  3. Acute left lower extremity DVT: -Recent left hip fracture s/p intramedullary nail with nonadherence to Lovenox DVT PPX  -Heparin gtt, with transition to DVT-dosed [facility] prior to discharge  -Needs VQ scan when stable to evaluate for PE (renal function precludes chest CTA)  4.  CAD status post four-vessel CABG: -No symptoms concerning for -Initial high-sensitivity troponin normal, not cycled -Heparin drip with transition to oral anticoagulation prior to discharge as outlined above, at that time, would discontinue ASA to minimize bleeding risk -No plans for inpatient ischemic evaluation at this time  5.  Acute on CKD stage IV: -Renal function slightly improved this morning -Likely ATN in the setting of acute infection and hypotension -IV fluids as outlined above -Monitor -Avoid nephrotoxic agents/medications  6.  Acute on chronic anemia of chronic disease: -Hemoglobin downtrending to 7.5 this morning, likely some component of dilution following IV fluid resuscitation -Has previously required blood transfusion -Transfuse/IV iron as indicated per internal medicine  7.  Chronic combined systolic and diastolic CHF/ICM: -Patient was subsequent normalization of LVSF as outlined above by echo in 03/2019 -He appears euvolemic -Lasix on hold -As BP allows resume metoprolol -Check echo as above  8.  Hyponatremia: -Improving -Possibly secondary to volume  depletion   For questions or updates, please contact [place] Please consult [email or web address] for contact info under Cardiology/STEMI.   Signed, Christell Faith, PA-C [place] Pager: [phone] 01/29/2020, 8:32 AM

## 2020-01-29 NOTE — Progress Notes (Signed)
ANTICOAGULATION CONSULT NOTE  Pharmacy Consult for heparin Indication: DVT  No Known Allergies  Patient Measurements: Height: 5\' 7"  (170.2 cm) Weight: 67.6 kg (149 lb) IBW/kg (Calculated) : 66.1 Heparin Dosing Weight: 67 kg  Vital Signs: Temp: 98.4 F (36.9 C) (06/24 2233) Temp Source: Oral (06/24 2233) BP: 90/48 (06/25 0216) Pulse Rate: 129 (06/25 0216)  Labs: Recent Labs    01/28/20 0425 01/28/20 2313 01/29/20 0255  HGB 8.2*  --  7.5*  HCT 26.5*  --  23.3*  PLT 306  --  242  APTT  --  64*  --   LABPROT  --  17.9*  --   INR  --  1.5*  --   HEPARINUNFRC  --   --  0.32  CREATININE 4.06*  --  3.89*  TROPONINIHS 11  --   --     Estimated Creatinine Clearance: 14.6 mL/min (A) (by C-G formula based on SCr of 3.89 mg/dL (H)).   Medical History: Past Medical History:  Diagnosis Date  . (HFpEF) heart failure with preserved ejection fraction ([place])    a. 03/2019 Echo: EF 60-65%, nl RV fxn.  Marland Kitchen AAA (abdominal aortic aneurysm) ([place]) 09/2019   3.0 infrarenal AAA incidentally noted on abdominal/pelvic CT  . Bladder cancer ([place])   . C. difficile colitis 04/2019  . CHF (congestive heart failure) ([place])   . CKD (chronic kidney disease), stage IV ([place])   . COPD (chronic obstructive pulmonary disease) ([place])   . Coronary artery disease    a. 10/2017 NSTEMI/Cath: LM 54m, 75d, LAD 75ost, 90p, D1 100, LCX 49m/d, OM1 90, RCA 25p; b. 10/2017 CABG x 4 ([facility]): LIMA->LAD, VG->Diag, VG->OM1, VG->OM2.  . Hyperlipidemia   . Hypertension   . NSVT (nonsustained ventricular tachycardia) ([place])    a. 06/2019 Zio: 2 runs of NSVT up to 12 beats, max rate 187 bpm.  . PSVT (paroxysmal supraventricular tachycardia) ([place])    06/2019 Zio: 1820 episodes of SVT lasting up to 00:01:16 w/ max rate of 210.  . Type 2 diabetes mellitus [facility])     Assessment: 79 year old male with ultrasound left lower extremity positive for acute nonocclusive DVT within the popliteal vein. Pharmacy consulted for heparin  drip.  0625 0255 HL 0.32, therapeutic.  H/H down, PLTs OK   Goal of Therapy:  Heparin level 0.3-0.7 units/ml Monitor platelets by anticoagulation protocol: Yes   Plan:  Continue Heparin drip at 1100 units/hr. Recheck HL 6/25 at 1100 to confirm.  CBC daily while on heparin drip.  Ena Dawley, PharmD 01/29/2020,4:12 AM

## 2020-01-29 NOTE — Progress Notes (Signed)
PROGRESS NOTE    Bryan Brewer  AOZ:[id number] DOB: July 27, 1941 DOA: 01/28/2020 PCP: Bryan Harrier, MD   Brief Narrative:  HPI on 01/28/2020 by Dr. Aretta Nip Agbata Bryan Brewer is a 79 y.o. male with medical history significant forCAD, HFpEF, CKD 4, COPD,bladder cancer status post cystoprostatectomy with urostomy and ileal conduit,CADs/pCABG who presents to the emergency room for evaluation of weakness and nausea.  Patient and her spouse were also concerned that he had some blood in his urine and had been on a blood thinner since he was discharged from the hospital following an intramedullary nail placement in his left hip. He denies having any fever or chills, denies having any chest pain, abdominal pain or changes in his bowel habits. Labs revealed worsening of his renal function from baseline, his serum creatinine on discharge was 2.86 and today on admission it is 4.06, sodium is low at 123 and he has a white count of 14.5 with a hemoglobin of 8.2 that is chronic. Chest x-ray shows no acute findings and he has pyuria that appears to be chronic. Patient has swelling of his left lower extremity and venous Doppler shows Positive for acute nonocclusive DVT within the popliteal vein.  Assessment & Plan   New onset Atrial fibrillation -Rate had noted to be up to 140s yesterday, patient was placed on amiodarone drip as well as heparin drip -Cardiology consulted and appreciated -Atrial fibrillation treatment complicated by hypotension -Echocardiogram ordered -Pending TSH -Continue to keep potassium at 4 and magnesium 2 -Currently on heparin  Hypotension -Have moved patient to ICU -Given IV fluid bolus -Placed on midodrine 10 mg 3 times daily  Possible sepsis secondary to UTI -Present on admission, patient was noted to have leukocytosis with tachycardia -UA on admission: Showed moderate leukocytes, 21-50 WBC, no bacteria seen -Unfortunately urine culture was not done, will attempt  to obtain it now -Blood cultures were not taken on admission either, will order now -Patient placed on Merrem for history of ESBL -Cytosis is improving today  Acute left lower extremity DVT -Patient with recent left hip fracture status post intramedullary nail and nonadherence to Lovenox DVT prophylaxis -Extremity Doppler positive for acute nonocclusive DVT within the popliteal vein -Place patient on heparin drip -May likely need VQ scan to evaluate for PE once patient is more stable  Acute kidney injury on chronic kidney disease, stage IV -Baseline creatinine approximately 3.4, however on admission was 4.06 -Placed on IV fluids, creatinine currently down to 3.89 -Continue to monitor BMP  Hyponatremia -Sodium was 123 on admission -Based on IV fluids, sodium up to 127 -Continue to monitor BMP  Chronic combined systolic and diastolic heart failure -Appears to be euvolemic -Echocardiogram on 03/31/2019 shows an EF of 60 to 65% -Continue to monitor intake and output, daily weights  COPD -Appears to be stable, will continue to monitor  Acute on chronic anemia of chronic disease -Hemoglobin down to 7.5, will continue to monitor CBC  Diabetes mellitus, type II with hypoglycemic events -Last hemoglobin A1c 6.4 on 01/13/2020 -Continue D5   DVT Prophylaxis Heparin  Code Status: Full  Family Communication: None at bedside  Disposition Plan:  Status is: Inpatient  Remains inpatient appropriate because:Hemodynamically unstable, Ongoing diagnostic testing needed not appropriate for outpatient work up, IV treatments appropriate due to intensity of illness or inability to take PO and Inpatient level of care appropriate due to severity of illness   Dispo: The patient is from: Home  Anticipated d/c is to: TBD              Anticipated d/c date is: > 3 days              Patient currently is not medically stable to d/c.  Consultants Cardiology PCCM  Procedures  Left  lower extremity Doppler  Antibiotics   Anti-infectives (From admission, onward)   Start     Dose/Rate Route Frequency Ordered Stop   01/28/20 1800  meropenem (MERREM) 500 mg in sodium chloride 0.9 % 100 mL IVPB     Discontinue     500 mg 200 mL/hr over 30 Minutes Intravenous Every 12 hours 01/28/20 1737        Subjective:   Bryan Brewer seen and examined today.  Patient feels mildly short of breath and very tired this morning.  Denies current chest pain, denies abdominal pain, nausea or vomiting, diarrhea or constipation, dizziness or headache.  Objective:   Vitals:   01/29/20 0845 01/29/20 0900 01/29/20 0915 01/29/20 1000  BP: (!) 86/43 (!) 81/44 (!) 84/44 117/61  Pulse: (!) 122 (!) 124 (!) 123   Resp: 17 12 14    Temp:    97.6 F (36.4 C)  TempSrc:    Oral  SpO2: 100% 100% 100%   Weight:    71.7 kg  Height:    5\' 7"  (1.702 m)    Intake/Output Summary (Last 24 hours) at 01/29/2020 1322 Last data filed at 01/29/2020 0849 Gross per 24 hour  Intake 256.62 ml  Output 350 ml  Net -93.38 ml   Filed Weights   01/28/20 0417 01/29/20 1000  Weight: 67.6 kg 71.7 kg    Exam  General: Well developed, chronically ill-appearing, frail, NAD  HEENT: NCAT,  mucous membranes moist.   Cardiovascular: S1 S2 auscultated, irregularly irregular  Respiratory: Diminished breath sounds  Abdomen: Soft, nontender, nondistended, + bowel sounds  Extremities: warm dry without cyanosis clubbing or edema  Neuro: AAOx3, nonfocal  Psych: Appropriate mood and affect   Data Reviewed: I have personally reviewed following labs and imaging studies  CBC: Recent Labs  Lab 01/22/20 1323 01/28/20 0425 01/29/20 0255  WBC 13.1* 14.5* 11.0*  NEUTROABS 11.7* 13.4*  --   HGB 8.4* 8.2* 7.5*  HCT 27.4* 26.5* 23.3*  MCV 89.0 87.7 83.5  PLT 287 306 948   Basic Metabolic Panel: Recent Labs  Lab 01/28/20 0425 01/29/20 0255  NA 123* 127*  K 4.7 5.1  CL 92* 98  CO2 17* 17*  GLUCOSE 52* 83    BUN 101* 101*  CREATININE 4.06* 3.89*  CALCIUM 7.5* 7.3*   GFR: Estimated Creatinine Clearance: 14.6 mL/min (A) (by C-G formula based on SCr of 3.89 mg/dL (H)). Liver Function Tests: Recent Labs  Lab 01/28/20 0425  AST 25  ALT 8  ALKPHOS 138*  BILITOT 1.2  PROT 6.7  ALBUMIN 2.1*   No results for input(s): LIPASE, AMYLASE in the last 168 hours. No results for input(s): AMMONIA in the last 168 hours. Coagulation Profile: Recent Labs  Lab 01/28/20 2313  INR 1.5*   Cardiac Enzymes: No results for input(s): CKTOTAL, CKMB, CKMBINDEX, TROPONINI in the last 168 hours. BNP (last 3 results) No results for input(s): PROBNP in the last 8760 hours. HbA1C: No results for input(s): HGBA1C in the last 72 hours. CBG: Recent Labs  Lab 01/28/20 2003 01/29/20 0244 01/29/20 0558 01/29/20 0759 01/29/20 1004  GLUCAP 88 85 74 118* 189*   Lipid Profile: No results  for input(s): CHOL, HDL, LDLCALC, TRIG, CHOLHDL, LDLDIRECT in the last 72 hours. Thyroid Function Tests: Recent Labs    01/29/20 1048  TSH 1.818   Anemia Panel: No results for input(s): VITAMINB12, FOLATE, FERRITIN, TIBC, IRON, RETICCTPCT in the last 72 hours. Urine analysis:    Component Value Date/Time   COLORURINE YELLOW (A) 01/28/2020 0959   APPEARANCEUR CLOUDY (A) 01/28/2020 0959   LABSPEC 1.010 01/28/2020 0959   PHURINE 7.0 01/28/2020 0959   GLUCOSEU NEGATIVE 01/28/2020 0959   HGBUR SMALL (A) 01/28/2020 0959   BILIRUBINUR NEGATIVE 01/28/2020 [place] 01/28/2020 0959   PROTEINUR 100 (A) 01/28/2020 0959   NITRITE NEGATIVE 01/28/2020 0959   LEUKOCYTESUR MODERATE (A) 01/28/2020 0959   Sepsis Labs: @LABRCNTIP (procalcitonin:4,lacticidven:4)  ) Recent Results (from the past 240 hour(s))  SARS Coronavirus 2 by RT PCR (hospital order, performed in [facility] lab) Nasopharyngeal Nasopharyngeal Swab     Status: None   Collection Time: 01/28/20  9:04 AM   Specimen: Nasopharyngeal Swab   Result Value Ref Range Status   SARS Coronavirus 2 NEGATIVE NEGATIVE Final    Comment: (NOTE) SARS-CoV-2 target nucleic acids are NOT DETECTED.  The SARS-CoV-2 RNA is generally detectable in upper and lower respiratory specimens during the acute phase of infection. The lowest concentration of SARS-CoV-2 viral copies this assay can detect is 250 copies / mL. A negative result does not preclude SARS-CoV-2 infection and should not be used as the sole basis for treatment or other patient management decisions.  A negative result may occur with improper specimen collection / handling, submission of specimen other than nasopharyngeal swab, presence of viral mutation(s) within the areas targeted by this assay, and inadequate number of viral copies (<250 copies / mL). A negative result must be combined with clinical observations, patient history, and epidemiological information.  Fact Sheet for Patients:   StrictlyIdeas.no  Fact Sheet for Healthcare Providers: BankingDealers.co.za  This test is not yet approved or  cleared by the [place] FDA and has been authorized for detection and/or diagnosis of SARS-CoV-2 by FDA under an Emergency Use Authorization (EUA).  This EUA will remain in effect (meaning this test can be used) for the duration of the COVID-19 declaration under Section 564(b)(1) of the Act, 21 U.S.C. section 360bbb-3(b)(1), unless the authorization is terminated or revoked sooner.  Performed at [facility], [address] [place], [place] [address]   MRSA PCR Screening     Status: None   Collection Time: 01/29/20 10:10 AM   Specimen: Nasopharyngeal  Result Value Ref Range Status   MRSA by PCR NEGATIVE NEGATIVE Final    Comment:        The GeneXpert MRSA Assay (FDA approved for NASAL specimens only), is one component of a comprehensive MRSA colonization surveillance program. It is not intended to diagnose  MRSA infection nor to guide or monitor treatment for MRSA infections. Performed at [facility], [address], [place],  [address]       Radiology Studies: US Venous Img Lower Unilateral Left (DVT)  Result Date: 01/28/2020 CLINICAL DATA:  Left leg pain and swelling EXAM: Left LOWER EXTREMITY VENOUS DOPPLER ULTRASOUND TECHNIQUE: Gray-scale sonography with compression, as well as color and duplex ultrasound, were performed to evaluate the deep venous system(s) from the level of the common femoral vein through the popliteal and proximal calf veins. COMPARISON:  None. FINDINGS: VENOUS Normal compressibility of the common femoral and superficial femoral veins. Noncompressible left popliteal vein with  nonocclusive thrombus present. Peroneal veins are not well demonstrated. Visualized portions of profunda femoral vein and great saphenous vein unremarkable. Doppler waveforms show normal direction of venous flow, normal respiratory plasticity and response to augmentation within the patent lower extremity veins. Limited views of the contralateral common femoral vein are unremarkable. OTHER Left groin lymph nodes measuring up to 4.7 cm. Fluid and edema within the subcutaneous soft tissues. Limitations: none IMPRESSION: Positive for acute nonocclusive DVT within the popliteal vein. These results will be called to the ordering clinician or representative by the Radiologist Assistant, and communication documented in the PACS or [employer]. Electronically Signed   By: Donavan Foil M.D.   On: 01/28/2020 15:32   DG Chest Portable 1 View  Result Date: 01/28/2020 CLINICAL DATA:  Shortness of breath, fell 2 weeks ago and broke LEFT hip, decreased urine output, gross hematuria with strong smelling urine, history heart failure, COPD, coronary artery disease, type II diabetes mellitus, arrhythmias, hypertension EXAM: PORTABLE CHEST 1 VIEW COMPARISON:  Portable exam 0834 hours compared to  01/13/2020 FINDINGS: Rotated to the LEFT. Normal heart size post CABG. Mediastinal contours and pulmonary vascularity normal. Atherosclerotic calcification aorta. LEFT basilar atelectasis. Chronic accentuation of interstitial markings unchanged. Tiny LEFT pleural effusion. No acute infiltrate or pneumothorax. IMPRESSION: New LEFT basilar atelectasis and tiny LEFT pleural effusion. Underlying chronic lung disease changes. Aortic Atherosclerosis (ICD10-I70.0). Electronically Signed   By: Lavonia Dana M.D.   On: 01/28/2020 08:44     Scheduled Meds: . aspirin EC  81 mg Oral QPM  . atorvastatin  80 mg Oral QHS  . Chlorhexidine Gluconate Cloth  6 each Topical Daily  . ferrous sulfate  325 mg Oral BID  . metoprolol tartrate  25 mg Oral BID  . midodrine  10 mg Oral TID WC  . pantoprazole  40 mg Oral Daily  . sodium bicarbonate  1,300 mg Oral TID  . sodium chloride flush  3 mL Intravenous Q12H   Continuous Infusions: . sodium chloride    . amiodarone    . dextrose 5 % and 0.9% NaCl 75 mL/hr at 01/29/20 0400  . heparin 1,100 Units/hr (01/29/20 0400)  . meropenem (MERREM) IV Stopped (01/28/20 2205)     LOS: 1 day   Time Spent in minutes   45 minutes  Nyjai Graff D.O. on 01/29/2020 at 1:22 PM  Between 7am to 7pm - Please see pager noted on [email or web address]  After 7pm go to [email or web address]  And look for the night coverage person covering for me after hours  [facility] Office  [phone]

## 2020-01-29 NOTE — Progress Notes (Signed)
Patient hemodynamically challenging, on Amiodarone infusion with previous VS on 11-7 SBP 74/53 , and currently BP 86/43, HR ST @ 125 bpm, pt c/o SOB, O2 Sat 100%, with [place] at 2L/min. Patient is alert and mentating well. Lungs clear ,but diaphoretic. Dr. Ree Kida in to see pt and pt prepared for transfer to ICU, VS q 15 mins.

## 2020-01-29 NOTE — Progress Notes (Signed)
Pt in ST at 125 on Amiodarone gtt (33.39mL/h), D5NS at 36mL/h, and Heparin at 1100 units/h.  BP low (see below). Dr. Ree Kida at bedside and 250 mL bolus of D5NS ordered.  Plan is to transfer to ICU.    01/29/20 0819  Assess: MEWS Score  BP (!) 71/52  Pulse Rate (!) 125  ECG Heart Rate (!) 126  Resp 16  Level of Consciousness Alert  SpO2 100 %  O2 Device Nasal Cannula  Patient Activity (if Appropriate) In bed  O2 Flow Rate (L/min) 2 L/min  Assess: MEWS Score  MEWS Temp 0  MEWS Systolic 2  MEWS Pulse 2  MEWS RR 0  MEWS LOC 0  MEWS Score 4  MEWS Score Color Red  Assess: if the MEWS score is Yellow or Red  Were vital signs taken at a resting state? Yes  Focused Assessment Documented focused assessment  Early Detection of Sepsis Score *See Row Information* Medium  MEWS guidelines implemented *See Row Information* Yes  Treat  MEWS Interventions Escalated (See documentation below);Administered prn meds/treatments  Take Vital Signs  Increase Vital Sign Frequency  Red: Q 1hr X 4 then Q 4hr X 4, if remains red, continue Q 4hrs  Escalate  MEWS: Escalate Red: discuss with charge nurse/RN and provider, consider discussing with RRT  Notify: Charge Nurse/RN  Name of Charge Nurse/RN Notified [place], RN  Date Charge Nurse/RN Notified 01/29/20  Time Charge Nurse/RN Notified 0820  Notify: Provider  Provider Name/Title Dr. Ree Kida  Date Provider Notified 01/29/20  Time Provider Notified [phone]  Notification Type Face-to-face  Notification Reason Change in status  Response See new orders  Date of Provider Response 01/29/20  Time of Provider Response 0820  Document  Patient Outcome Transferred/level of care increased  Progress note created (see row info) Yes

## 2020-01-29 NOTE — Progress Notes (Signed)
Order received to give patient Metoprolol 25 mg PO. Will implement intervention and monitor patient very closely.

## 2020-01-29 NOTE — Consult Note (Signed)
Name: Bryan Brewer MRN: [id number] DOB: Oct 15, 1940     CONSULTATION DATE: 01/29/2020  REFERRING MD : Ree Kida  CHIEF COMPLAINT: low BP  HISTORY OF PRESENT ILLNESS:  79 y.o.malewithmedical history significant forCAD, HFpEF, CKD 4, COPD,bladder cancer status post cystoprostatectomy with urostomy and ileal conduit,CADs/pCABGwho presents to the emergency room for evaluation of weakness and nausea.   Patient and her spouse were also concerned that he had some blood in his urine and had been on a blood thinner since he was discharged from the hospital following anintramedullary nail placement in his left hip.   He denies having any fever or chills, denies having any chest pain,abdominal pain or changes in his bowel habits.  Labs revealed worsening of his renal function from baseline,his serum creatinine on discharge was 2.86 and today on admission it is 3.89.  sodium is low at 123 and he has a white count of 14.5 with a hemoglobin of 8.2 that is chronic.  Chest x-ray shows no acute findings and he has pyuria that appears to be chronic.  Patient has swelling of his left lower extremity and venous Doppler showsPositive for acute nonocclusive DVT within the popliteal vein.  transferred to [place] for acute afib with RVR and low BP Patient give IVF's, started on midodrine Alert and awake following commands Awaiting cardiology recs   CBC    Component Value Date/Time   WBC 11.0 (H) 01/29/2020 0255   RBC 2.79 (L) 01/29/2020 0255   HGB 7.5 (L) 01/29/2020 0255   HGB 9.1 (L) 05/20/2019 1209   HCT 23.3 (L) 01/29/2020 0255   HCT 27.8 (L) 05/20/2019 1209   PLT 242 01/29/2020 0255   PLT 321 05/20/2019 1209   MCV 83.5 01/29/2020 0255   MCV 89 05/20/2019 1209   MCV 88 04/21/2012 1239   MCH 26.9 01/29/2020 0255   MCHC 32.2 01/29/2020 0255   RDW 14.8 01/29/2020 0255   RDW 15.1 05/20/2019 1209   RDW 14.9 (H) 04/21/2012 1239   LYMPHSABS 0.4 (L) 01/28/2020 0425   LYMPHSABS 1.1  04/21/2012 1239   MONOABS 0.4 01/28/2020 0425   MONOABS 0.7 04/21/2012 1239   EOSABS 0.0 01/28/2020 0425   EOSABS 0.1 04/21/2012 1239   BASOSABS 0.1 01/28/2020 0425   BASOSABS 0.1 04/21/2012 1239   BMP Latest Ref Rng & Units 01/29/2020 01/28/2020 01/19/2020  Glucose 70 - 99 mg/dL 83 52(L) 125(H)  BUN 8 - 23 mg/dL 101(H) 101(H) 74(H)  Creatinine 0.61 - 1.24 mg/dL 3.89(H) 4.06(H) 2.86(H)  BUN/Creat Ratio 10 - 24 - - -  Sodium 135 - 145 mmol/L 127(L) 123(L) 135  Potassium 3.5 - 5.1 mmol/L 5.1 4.7 4.3  Chloride 98 - 111 mmol/L 98 92(L) 105  CO2 22 - 32 mmol/L 17(L) 17(L) 21(L)  Calcium 8.9 - 10.3 mg/dL 7.3(L) 7.5(L) 8.2(L)    PAST MEDICAL HISTORY :   has a past medical history of (HFpEF) heart failure with preserved ejection fraction ([place]), AAA (abdominal aortic aneurysm) ([place]) (09/2019), Bladder cancer ([place]), C. difficile colitis (04/2019), CHF (congestive heart failure) ([place]), CKD (chronic kidney disease), stage IV ([place]), COPD (chronic obstructive pulmonary disease) ([place]), Coronary artery disease, Hyperlipidemia, Hypertension, NSVT (nonsustained ventricular tachycardia) ([place]), PSVT (paroxysmal supraventricular tachycardia) ([place]), and Type 2 diabetes mellitus ([place]).  has a past surgical history that includes Revision urostomy cutaneous; LEFT HEART CATH AND CORONARY ANGIOGRAPHY (N/A, 10/31/2017); Colonoscopy; Esophagogastroduodenoscopy (egd) with propofol (N/A, 04/21/2019); Esophagogastroduodenoscopy (N/A, 04/23/2019); Coronary artery bypass graft (2019); and Intramedullary (im) nail intertrochanteric (Left, 01/14/2020). Prior  to Admission medications   Medication Sig Start Date End Date Taking? Authorizing Provider  aspirin EC 81 MG tablet Take 81 mg by mouth every evening.    Yes [provider]  atorvastatin (LIPITOR) 80 MG tablet Take 80 mg by mouth at bedtime.   Yes [provider]  glipiZIDE (GLUCOTROL) 5 MG tablet Take 0.5 tablets (2.5 mg total) by mouth daily before  breakfast. 05/01/19 06/30/28 Yes Sainani, Belia Heman, MD  metoprolol tartrate (LOPRESSOR) 25 MG tablet Take 1 tablet (25 mg total) by mouth 2 (two) times daily. 06/26/19 01/28/20 Yes End, Harrell Gave, MD  pantoprazole (PROTONIX) 40 MG tablet Take 1 tablet (40 mg total) by mouth daily. 04/25/19 04/24/20 Yes Dustin Flock, MD  sodium bicarbonate 650 MG tablet Take 1,300 mg by mouth 3 (three) times daily.   Yes [provider]  acetaminophen (TYLENOL) 500 MG tablet Take 2 tablets (1,000 mg total) by mouth 3 (three) times daily as needed for mild pain or moderate pain (pain score 1-3 or temp > 100.5). 01/16/20   Enzo Bi, MD  blood glucose meter kit and supplies KIT Dispense based on patient and insurance preference. Use up to four times daily as directed. (FOR ICD-9 250.00, 250.01). 04/04/19   Gladstone Lighter, MD  Darbepoetin Alfa (ARANESP) 40 MCG/0.4ML SOSY injection Inject 0.25 mLs (25 mcg total) into the skin every 7 (seven) days. 01/26/20   Lorella Nimrod, MD  enoxaparin (LOVENOX) 40 MG/0.4ML injection Inject 0.4 mLs (40 mg total) into the skin daily for 14 doses. Patient not taking: Reported on 01/28/2020 01/16/20 01/30/20  Enzo Bi, MD  ferrous sulfate 325 (65 FE) MG EC tablet Take 1 tablet (325 mg total) by mouth 2 (two) times daily. Patient not taking: Reported on 01/28/2020 01/19/20 01/18/21  Lorella Nimrod, MD  furosemide (LASIX) 20 MG tablet Hold until outpatient doctor followup due to low normal BP. Patient not taking: Reported on 01/28/2020 01/16/20   Enzo Bi, MD  ipratropium-albuterol (DUONEB) 0.5-2.5 (3) MG/3ML SOLN Take 3 mLs by nebulization every 6 (six) hours as needed (wheezing, shortness of breath). 04/04/19   Gladstone Lighter, MD  traMADol (ULTRAM) 50 MG tablet Take 1 tablet (50 mg total) by mouth every 6 (six) hours as needed for moderate pain. 01/16/20   Enzo Bi, MD   No Known Allergies  FAMILY HISTORY:  family history includes Breast cancer in his sister; CAD in his father and  mother; Stroke in his father and mother. SOCIAL HISTORY:  reports that he quit smoking about 8 years ago. His smoking use included cigarettes. He has a 52.50 pack-year smoking history. He has never used smokeless tobacco. He reports that he does not drink alcohol and does not use drugs.    Review of Systems:  Gen:  Denies  fever, sweats, chills weigh loss  HEENT: Denies blurred vision, double vision, ear pain, eye pain, hearing loss, nose bleeds, sore throat Cardiac:  No dizziness, chest pain or heaviness, chest tightness,edema, No JVD Resp:   No cough, -sputum production, -shortness of breath,-wheezing, -hemoptysis,  Gi: Denies swallowing difficulty, stomach pain, nausea or vomiting, diarrhea, constipation, bowel incontinence Gu:  Denies bladder incontinence, burning urine Ext:   Denies Joint pain, stiffness or swelling Skin: Denies  skin rash, easy bruising or bleeding or hives Endoc:  Denies polyuria, polydipsia , polyphagia or weight change Psych:   Denies depression, insomnia or hallucinations  Other:  All other systems negative     VITAL SIGNS: Temp:  [97.6 F (36.4 C)-98.4  F (36.9 C)] 97.6 F (36.4 C) (06/25 1000) Pulse Rate:  [115-135] 123 (06/25 0915) Resp:  [12-22] 14 (06/25 0915) BP: (71-127)/(37-99) 117/61 (06/25 1000) SpO2:  [91 %-100 %] 100 % (06/25 0915) Weight:  [71.7 kg] 71.7 kg (06/25 1000)     SpO2: 100 % O2 Flow Rate (L/min): 2 L/min    Physical Examination:   GENERAL:NAD, no fevers, chills, no weakness no fatigue HEAD: Normocephalic, atraumatic.  EYES: PERLA, EOMI No scleral icterus.  MOUTH: Moist mucosal membrane.  EAR, NOSE, THROAT: Clear without exudates. No external lesions.  NECK: Supple.  PULMONARY: CTA B/L no wheezing, rhonchi, crackles CARDIOVASCULAR: S1 and S2. Regular rate and rhythm. No murmurs GASTROINTESTINAL: Soft, nontender, nondistended. Positive bowel sounds.  MUSCULOSKELETAL: No swelling, clubbing, or edema.  NEUROLOGIC: No  gross focal neurological deficits. 5/5 strength all extremities SKIN: No ulceration, lesions, rashes, or cyanosis.  PSYCHIATRIC: Insight, judgment intact. -depression -anxiety ALL OTHER ROS ARE NEGATIVE   MEDICATIONS: I have reviewed all medications and confirmed regimen as documented    CULTURE RESULTS   Recent Results (from the past 240 hour(s))  SARS Coronavirus 2 by RT PCR (hospital order, performed in [facility] hospital lab) Nasopharyngeal Nasopharyngeal Swab     Status: None   Collection Time: 01/28/20  9:04 AM   Specimen: Nasopharyngeal Swab  Result Value Ref Range Status   SARS Coronavirus 2 NEGATIVE NEGATIVE Final    Comment: (NOTE) SARS-CoV-2 target nucleic acids are NOT DETECTED.  The SARS-CoV-2 RNA is generally detectable in upper and lower respiratory specimens during the acute phase of infection. The lowest concentration of SARS-CoV-2 viral copies this assay can detect is 250 copies / mL. A negative result does not preclude SARS-CoV-2 infection and should not be used as the sole basis for treatment or other patient management decisions.  A negative result may occur with improper specimen collection / handling, submission of specimen other than nasopharyngeal swab, presence of viral mutation(s) within the areas targeted by this assay, and inadequate number of viral copies (<250 copies / mL). A negative result must be combined with clinical observations, patient history, and epidemiological information.  Fact Sheet for Patients:   StrictlyIdeas.no  Fact Sheet for Healthcare Providers: BankingDealers.co.za  This test is not yet approved or  cleared by the [place] FDA and has been authorized for detection and/or diagnosis of SARS-CoV-2 by FDA under an Emergency Use Authorization (EUA).  This EUA will remain in effect (meaning this test can be used) for the duration of the COVID-19 declaration under Section  564(b)(1) of the Act, 21 U.S.C. section 360bbb-3(b)(1), unless the authorization is terminated or revoked sooner.  Performed at [facility], [address] [place], [place] [address]   MRSA PCR Screening     Status: None   Collection Time: 01/29/20 10:10 AM   Specimen: Nasopharyngeal  Result Value Ref Range Status   MRSA by PCR NEGATIVE NEGATIVE Final    Comment:        The GeneXpert MRSA Assay (FDA approved for NASAL specimens only), is one component of a comprehensive MRSA colonization surveillance program. It is not intended to diagnose MRSA infection nor to guide or monitor treatment for MRSA infections. Performed at [facility], [address] [place], [place] [address]             ASSESSMENT AND PLAN SYNOPSIS  New onset Atrial fibrillation with RVR Follow up cardiology recs   Hypotension-responding to IVF Start midodrine    UTI  continue IV abx as prescribed   Acute left lower extremity DVT Continue  heparin drip No need for further scans, will presume PE   Acute kidney injury on chronic kidney disease, stage IV Follow UOP and CMP   Acute on chronic combined systolic and diastolic heart failure Follow up cardiology recs  Will follow along in consultation  Orvin Netter Patricia Pesa, M.D.  [facility]  Medical Director [place] Director [facility] Cardio-Pulmonary Department

## 2020-01-29 NOTE — Progress Notes (Signed)
[place] for heparin Indication: DVT  No Known Allergies  Patient Measurements: Height: 5\' 7"  (170.2 cm) Weight: 71.7 kg (158 lb 1.1 oz) IBW/kg (Calculated) : 66.1 Heparin Dosing Weight: 67 kg  Vital Signs: Temp: 97.6 F (36.4 C) (06/25 1000) Temp Source: Oral (06/25 1000) BP: 117/61 (06/25 1000) Pulse Rate: 123 (06/25 0915)  Labs: Recent Labs    01/28/20 0425 01/28/20 2313 01/29/20 0255 01/29/20 1048  HGB 8.2*  --  7.5*  --   HCT 26.5*  --  23.3*  --   PLT 306  --  242  --   APTT  --  64*  --   --   LABPROT  --  17.9*  --   --   INR  --  1.5*  --   --   HEPARINUNFRC  --   --  0.32 0.42  CREATININE 4.06*  --  3.89*  --   TROPONINIHS 11  --   --   --     Estimated Creatinine Clearance: 14.6 mL/min (A) (by C-G formula based on SCr of 3.89 mg/dL (H)).   Medical History: Past Medical History:  Diagnosis Date  . (HFpEF) heart failure with preserved ejection fraction ([place])    a. 03/2019 Echo: EF 60-65%, nl RV fxn.  Marland Kitchen AAA (abdominal aortic aneurysm) ([place]) 09/2019   3.0 infrarenal AAA incidentally noted on abdominal/pelvic CT  . Bladder cancer ([place])   . C. difficile colitis 04/2019  . CHF (congestive heart failure) ([place])   . CKD (chronic kidney disease), stage IV ([place])   . COPD (chronic obstructive pulmonary disease) ([place])   . Coronary artery disease    a. 10/2017 NSTEMI/Cath: LM 10m, 75d, LAD 75ost, 90p, D1 100, LCX 73m/d, OM1 90, RCA 25p; b. 10/2017 CABG x 4 ([facility]): LIMA->LAD, VG->Diag, VG->OM1, VG->OM2.  . Hyperlipidemia   . Hypertension   . NSVT (nonsustained ventricular tachycardia) ([place])    a. 06/2019 [place]: 2 runs of NSVT up to 12 beats, max rate 187 bpm.  . PSVT (paroxysmal supraventricular tachycardia) ([place])    06/2019 [facility]: 1820 episodes of SVT lasting up to 00:01:16 w/ max rate of 210.  . Type 2 diabetes mellitus [facility])     Assessment: 79 year old male with ultrasound left lower extremity positive for acute nonocclusive  DVT within the popliteal vein. Pharmacy consulted for heparin drip.  6/25 0255 HL 0.32, therapeutic.   6/25 1048 HL 0.42.   Goal of Therapy:  Heparin level 0.3-0.7 units/ml Monitor platelets by anticoagulation protocol: Yes   Plan:  Heparin level is therapeutic. Will continue Heparin drip at 1100 units/hr. Order anti-xa and CBC with AM labs.   Oswald Hillock, PharmD, BCPS 01/29/2020,11:54 AM

## 2020-01-29 NOTE — Progress Notes (Signed)
NP notified that patient heart rate remains 130's after giving medication with BP now 90/68. Told to continue to monitor patient by NP and that Amiodarone may need to be ordered on patient. Patient being monitored very closely.

## 2020-01-29 NOTE — Progress Notes (Signed)
Rufina Falco, NP notified about patient heart rate 120's to 130's. Awaiting contact back on patient.

## 2020-01-29 NOTE — Progress Notes (Signed)
Report received from [place], [place] in ED on patient. Awaiting patient arrival to unit.

## 2020-01-30 DIAGNOSIS — I82401 Acute embolism and thrombosis of unspecified deep veins of right lower extremity: Secondary | ICD-10-CM

## 2020-01-30 LAB — CBC
HCT: 23.9 % — ABNORMAL LOW (ref 39.0–52.0)
Hemoglobin: 7.6 g/dL — ABNORMAL LOW (ref 13.0–17.0)
MCH: 27.2 pg (ref 26.0–34.0)
MCHC: 31.8 g/dL (ref 30.0–36.0)
MCV: 85.7 fL (ref 80.0–100.0)
Platelets: 258 10*3/uL (ref 150–400)
RBC: 2.79 MIL/uL — ABNORMAL LOW (ref 4.22–5.81)
RDW: 15.3 % (ref 11.5–15.5)
WBC: 13.7 10*3/uL — ABNORMAL HIGH (ref 4.0–10.5)
nRBC: 0 % (ref 0.0–0.2)

## 2020-01-30 LAB — BASIC METABOLIC PANEL
Anion gap: 12 (ref 5–15)
BUN: 95 mg/dL — ABNORMAL HIGH (ref 8–23)
CO2: 17 mmol/L — ABNORMAL LOW (ref 22–32)
Calcium: 7.5 mg/dL — ABNORMAL LOW (ref 8.9–10.3)
Chloride: 101 mmol/L (ref 98–111)
Creatinine, Ser: 3.64 mg/dL — ABNORMAL HIGH (ref 0.61–1.24)
GFR calc Af Amer: 17 mL/min — ABNORMAL LOW (ref >60)
GFR calc non Af Amer: 15 mL/min — ABNORMAL LOW (ref >60)
Glucose, Bld: 156 mg/dL — ABNORMAL HIGH (ref 70–99)
Potassium: 4.8 mmol/L (ref 3.5–5.1)
Sodium: 130 mmol/L — ABNORMAL LOW (ref 135–145)

## 2020-01-30 LAB — HEPARIN LEVEL (UNFRACTIONATED): Heparin Unfractionated: 0.45 IU/mL (ref 0.30–0.70)

## 2020-01-30 MED ORDER — METOPROLOL TARTRATE 25 MG PO TABS
12.5000 mg | ORAL_TABLET | Freq: Once | ORAL | Status: AC
  Administered 2020-01-30: 12.5 mg via ORAL

## 2020-01-30 NOTE — Progress Notes (Signed)
Pts hr has been a fib and bp's soft. Pt on RA .Marland Kitchen Pt alert and oriented. Still on heparin and amio drip. Pt currently resting comfortably in bed with no pain

## 2020-01-30 NOTE — Progress Notes (Signed)
Progress Note  Patient Name: Bryan Brewer Date of Encounter: 01/30/2020  [facility] Cardiologist: Nelva Bush, MD    Subjective   79 year old gentleman with a history of coronary artery disease, CABG, chronic systolic congestive heart failure, paroxysmal SVT, abdominal aortic aneurysm, type 2 diabetes mellitus, CKD stage IV, anemia of chronic disease, hypertension, hyperlipidemia and COPD.  We are asked to see him yesterday for atrial flutter with a rapid ventricular response.  The patient was recently hospitalized with a hip fracture.  He returned to the hospital several days ago with weakness and nausea. CT angiogram of the chest was not performed because of his chronic kidney disease.  He was also felt to be too unstable to go down for VQ scan.  Venous duplex scan revealed acute nonocclusive DVT in the left popliteal vein.  She is awake and alert this am No cp HR is still elevated 120-130 Flutter   Inpatient Medications    Scheduled Meds:  aspirin EC  81 mg Oral QPM   atorvastatin  80 mg Oral QHS   Chlorhexidine Gluconate Cloth  6 each Topical Daily   ferrous sulfate  325 mg Oral BID   metoprolol tartrate  25 mg Oral BID   midodrine  10 mg Oral TID WC   pantoprazole  40 mg Oral Daily   sodium bicarbonate  1,300 mg Oral TID   sodium chloride flush  3 mL Intravenous Q12H   Continuous Infusions:  sodium chloride     amiodarone 30 mg/hr (01/30/20 0554)   dextrose 5 % and 0.9% NaCl 75 mL/hr at 01/30/20 0554   heparin 1,100 Units/hr (01/30/20 0554)   meropenem (MERREM) IV Stopped (01/29/20 2229)   PRN Meds: sodium chloride, acetaminophen, ipratropium-albuterol, ondansetron **OR** ondansetron (ZOFRAN) IV, sodium chloride flush, traMADol   Vital Signs    Vitals:   01/30/20 0600 01/30/20 0700 01/30/20 0723 01/30/20 0800  BP: (!) 89/59 (!) 86/62 (!) 86/62 (!) 82/57  Pulse: (!) 121 (!) 122  (!) 124  Resp: 10 10  10   Temp:   97.7 F (36.5 C)     TempSrc:   Oral   SpO2: 100% 97%  100%  Weight:      Height:        Intake/Output Summary (Last 24 hours) at 01/30/2020 0904 Last data filed at 01/30/2020 0554 Gross per 24 hour  Intake 2278.58 ml  Output 1050 ml  Net 1228.58 ml   Last 3 Weights 01/29/2020 01/28/2020 01/13/2020  Weight (lbs) 158 lb 1.1 oz 149 lb 144 lb  Weight (kg) 71.7 kg 67.586 kg 65.318 kg      Telemetry    Atrial flutter with RVR  - Personally Reviewed  ECG      - Personally Reviewed  Physical Exam   GEN:  elderly male,  NAD   Neck: No JVD Cardiac: RRR,  tachy  Respiratory: Clear to auscultation bilaterally. GI: Soft, nontender, non-distended  MS: No edema; No deformity. Neuro:  Nonfocal  Psych: Normal affect   Labs    High Sensitivity Troponin:   Recent Labs  Lab 01/13/20 2134 01/14/20 0052 01/28/20 0425  TROPONINIHS 11 12 11       Chemistry Recent Labs  Lab 01/28/20 0425 01/29/20 0255 01/30/20 0449  NA 123* 127* 130*  K 4.7 5.1 4.8  CL 92* 98 101  CO2 17* 17* 17*  GLUCOSE 52* 83 156*  BUN 101* 101* 95*  CREATININE 4.06* 3.89* 3.64*  CALCIUM 7.5* 7.3* 7.5*  PROT 6.7  --   --   ALBUMIN 2.1*  --   --   AST 25  --   --   ALT 8  --   --   ALKPHOS 138*  --   --   BILITOT 1.2  --   --   GFRNONAA 13* 14* 15*  GFRAA 15* 16* 17*  ANIONGAP 14 12 12      Hematology Recent Labs  Lab 01/28/20 0425 01/29/20 0255 01/30/20 0730  WBC 14.5* 11.0* 13.7*  RBC 3.02* 2.79* 2.79*  HGB 8.2* 7.5* 7.6*  HCT 26.5* 23.3* 23.9*  MCV 87.7 83.5 85.7  MCH 27.2 26.9 27.2  MCHC 30.9 32.2 31.8  RDW 15.0 14.8 15.3  PLT 306 242 258    BNPNo results for input(s): BNP, PROBNP in the last 168 hours.   DDimer No results for input(s): DDIMER in the last 168 hours.   Radiology    US Venous Img Lower Unilateral Left (DVT)  Result Date: 01/28/2020 CLINICAL DATA:  Left leg pain and swelling EXAM: Left LOWER EXTREMITY VENOUS DOPPLER ULTRASOUND TECHNIQUE: Gray-scale sonography with compression, as  well as color and duplex ultrasound, were performed to evaluate the deep venous system(s) from the level of the common femoral vein through the popliteal and proximal calf veins. COMPARISON:  None. FINDINGS: VENOUS Normal compressibility of the common femoral and superficial femoral veins. Noncompressible left popliteal vein with nonocclusive thrombus present. Peroneal veins are not well demonstrated. Visualized portions of profunda femoral vein and great saphenous vein unremarkable. Doppler waveforms show normal direction of venous flow, normal respiratory plasticity and response to augmentation within the patent lower extremity veins. Limited views of the contralateral common femoral vein are unremarkable. OTHER Left groin lymph nodes measuring up to 4.7 cm. Fluid and edema within the subcutaneous soft tissues. Limitations: none IMPRESSION: Positive for acute nonocclusive DVT within the popliteal vein. These results will be called to the ordering clinician or representative by the Radiologist Assistant, and communication documented in the PACS or [employer]. Electronically Signed   By: Donavan Foil M.D.   On: 01/28/2020 15:32    Cardiac Studies       Patient Profile     79 y.o. male with recent left hip fracture, admitted with new atrial flutter - possible PE.   Assessment & Plan    1.  Atrial flutter:   HR is still fast.  She is on amiodarone.  Metoprolol has been ordered but has not received any doses in the past day or so because of his relatively low blood pressure.  His blood pressure is a little bit better today so we will going to start 25 mg twice a day.  Continue amiodarone.  2.  Left popliteal vein DVT: He has a nonocclusive DVT in his popliteal vein.  Is possible that he developed a larger clot which embolized to his lungs.  We did not do a CT angiogram because of his chronic kidney disease.  He also has not been stable enough to go down for VQ scan.  We will continue empiric  therapy with heparin for now.  He denies any pleuritic chest pain.  He denies any shortness of breath.  3.  Hypertension: Continue current medications.  I encouraged him to eat regularly...  4.  Left hip fracture plans per primary medical team.        For questions or updates, please contact [facility] Please consult [email or web address] for contact info under  Signed, Mertie Moores, MD  01/30/2020, 9:04 AM

## 2020-01-30 NOTE — Progress Notes (Signed)
PROGRESS NOTE    Bryan Brewer  WRU:[id number] DOB: 01/07/1941 DOA: 01/28/2020 PCP: Tracie Harrier, MD   Brief Narrative:  HPI on 01/28/2020 by Dr. Aretta Nip Agbata Bryan Brewer is a 79 y.o. male with medical history significant forCAD, HFpEF, CKD 4, COPD,bladder cancer status post cystoprostatectomy with urostomy and ileal conduit,CADs/pCABG who presents to the emergency room for evaluation of weakness and nausea.  Patient and her spouse were also concerned that he had some blood in his urine and had been on a blood thinner since he was discharged from the hospital following an intramedullary nail placement in his left hip. He denies having any fever or chills, denies having any chest pain, abdominal pain or changes in his bowel habits. Labs revealed worsening of his renal function from baseline, his serum creatinine on discharge was 2.86 and today on admission it is 4.06, sodium is low at 123 and he has a white count of 14.5 with a hemoglobin of 8.2 that is chronic. Chest x-ray shows no acute findings and he has pyuria that appears to be chronic. Patient has swelling of his left lower extremity and venous Doppler shows Positive for acute nonocclusive DVT within the popliteal vein.  Assessment & Plan   New onset Atrial fibrillation -Rate had noted to be up to 140s yesterday, patient was placed on amiodarone drip as well as heparin drip -Cardiology consulted and appreciated -Atrial fibrillation treatment complicated by hypotension -Echocardiogram ordered -TSH 1.818 (see WNL) -Continue to keep potassium at 4 and magnesium 2 -Currently on heparin  Hypotension -Has moved patient to ICU -Given IV fluid bolus -Placed on midodrine 10 mg 3 times daily -Blood pressures remain soft today  Sepsis secondary to UTI -Present on admission, patient was noted to have leukocytosis with tachycardia -UA on admission: Showed moderate leukocytes, 21-50 WBC, no bacteria seen -Unfortunately urine  culture was not done, still pending urine culture -Blood cultures show no growth to date (however were taken after antibiotics were started) -Patient placed on Merrem for history of ESBL  Acute left lower extremity DVT -Patient with recent left hip fracture status post intramedullary nail and nonadherence to Lovenox DVT prophylaxis -Extremity Doppler positive for acute nonocclusive DVT within the popliteal vein -Place patient on heparin drip  Acute kidney injury on chronic kidney disease, stage IV -Baseline creatinine approximately 3.4, however on admission was 4.06 -Placed on IV fluids, creatinine currently down to 3.64 -Continue to monitor BMP  Hyponatremia -Sodium was 123 on admission -Based on IV fluids, sodium up to 130 -Continue to monitor BMP  Chronic combined systolic and diastolic heart failure -Appears to be euvolemic -Echocardiogram on 03/31/2019 shows an EF of 60 to 65% -Continue to monitor intake and output, daily weights  COPD -Appears to be stable, will continue to monitor  Acute on chronic anemia of chronic disease -Hemoglobin down to 7.6, will continue to monitor CBC  Diabetes mellitus, type II with hypoglycemic events -Last hemoglobin A1c 6.4 on 01/13/2020 -Continue D5   DVT Prophylaxis Heparin  Code Status: Full  Family Communication: None at bedside.  Wife via phone  Disposition Plan:  Status is: Inpatient  Remains inpatient appropriate because:Hemodynamically unstable, Ongoing diagnostic testing needed not appropriate for outpatient work up, IV treatments appropriate due to intensity of illness or inability to take PO and Inpatient level of care appropriate due to severity of illness   Dispo: The patient is from: Home              Anticipated d/c  is to: TBD              Anticipated d/c date is: > 3 days              Patient currently is not medically stable to d/c.  Consultants Cardiology PCCM  Procedures  Left lower extremity  Doppler  Antibiotics   Anti-infectives (From admission, onward)   Start     Dose/Rate Route Frequency Ordered Stop   01/28/20 1800  meropenem (MERREM) 500 mg in sodium chloride 0.9 % 100 mL IVPB     Discontinue     500 mg 200 mL/hr over 30 Minutes Intravenous Every 12 hours 01/28/20 1737        Subjective:   Bryan Brewer seen and examined today.  Patient states he is feeling better this morning.  He denies chest pain or current shortness of breath.  Denies abdominal pain, nausea vomiting, diarrhea constipation, dizziness or headache.  Objective:   Vitals:   01/30/20 0723 01/30/20 0800 01/30/20 0900 01/30/20 1000  BP: (!) 86/62 (!) 82/57 111/64 102/79  Pulse:  (!) 124 (!) 124 (!) 124  Resp:  10 11 16   Temp: 97.7 F (36.5 C)     TempSrc: Oral     SpO2:  100% 100% 100%  Weight:      Height:        Intake/Output Summary (Last 24 hours) at 01/30/2020 1154 Last data filed at 01/30/2020 0554 Gross per 24 hour  Intake 2278.58 ml  Output 1050 ml  Net 1228.58 ml   Filed Weights   01/28/20 0417 01/29/20 1000  Weight: 67.6 kg 71.7 kg   Exam  General: Well developed, chronically ill-appearing, NAD  HEENT: NCAT, mucous membranes moist.   Cardiovascular: S1 S2 auscultated, irregularly irregular  Respiratory: Diminished breath sounds  Abdomen: Soft, nontender, nondistended, + bowel sounds  Extremities: warm dry without cyanosis clubbing or edema.  Patient with bruising on lower extremities  Neuro: AAOx3, nonfocal  Psych: Appropriate mood and affect, pleasant  Data Reviewed: I have personally reviewed following labs and imaging studies  CBC: Recent Labs  Lab 01/28/20 0425 01/29/20 0255 01/30/20 0730  WBC 14.5* 11.0* 13.7*  NEUTROABS 13.4*  --   --   HGB 8.2* 7.5* 7.6*  HCT 26.5* 23.3* 23.9*  MCV 87.7 83.5 85.7  PLT 306 242 283   Basic Metabolic Panel: Recent Labs  Lab 01/28/20 0425 01/29/20 0255 01/29/20 1414 01/30/20 0449  NA 123* 127*  --  130*  K 4.7  5.1  --  4.8  CL 92* 98  --  101  CO2 17* 17*  --  17*  GLUCOSE 52* 83  --  156*  BUN 101* 101*  --  95*  CREATININE 4.06* 3.89*  --  3.64*  CALCIUM 7.5* 7.3*  --  7.5*  MG  --   --  1.8  --    GFR: Estimated Creatinine Clearance: 15.6 mL/min (A) (by C-G formula based on SCr of 3.64 mg/dL (H)). Liver Function Tests: Recent Labs  Lab 01/28/20 0425  AST 25  ALT 8  ALKPHOS 138*  BILITOT 1.2  PROT 6.7  ALBUMIN 2.1*   No results for input(s): LIPASE, AMYLASE in the last 168 hours. No results for input(s): AMMONIA in the last 168 hours. Coagulation Profile: Recent Labs  Lab 01/28/20 2313  INR 1.5*   Cardiac Enzymes: No results for input(s): CKTOTAL, CKMB, CKMBINDEX, TROPONINI in the last 168 hours. BNP (last 3 results) No results  for input(s): PROBNP in the last 8760 hours. HbA1C: No results for input(s): HGBA1C in the last 72 hours. CBG: Recent Labs  Lab 01/28/20 2201 01/29/20 0244 01/29/20 0558 01/29/20 0759 01/29/20 1004  GLUCAP 85 85 74 118* 189*   Lipid Profile: No results for input(s): CHOL, HDL, LDLCALC, TRIG, CHOLHDL, LDLDIRECT in the last 72 hours. Thyroid Function Tests: Recent Labs    01/29/20 1048  TSH 1.818   Anemia Panel: No results for input(s): VITAMINB12, FOLATE, FERRITIN, TIBC, IRON, RETICCTPCT in the last 72 hours. Urine analysis:    Component Value Date/Time   COLORURINE YELLOW (A) 01/28/2020 0959   APPEARANCEUR CLOUDY (A) 01/28/2020 0959   LABSPEC 1.010 01/28/2020 0959   PHURINE 7.0 01/28/2020 0959   GLUCOSEU NEGATIVE 01/28/2020 0959   HGBUR SMALL (A) 01/28/2020 0959   BILIRUBINUR NEGATIVE 01/28/2020 [place] 01/28/2020 0959   PROTEINUR 100 (A) 01/28/2020 0959   NITRITE NEGATIVE 01/28/2020 0959   LEUKOCYTESUR MODERATE (A) 01/28/2020 0959   Sepsis Labs: @LABRCNTIP (procalcitonin:4,lacticidven:4)  ) Recent Results (from the past 240 hour(s))  SARS Coronavirus 2 by RT PCR (hospital order, performed in [place]  hospital lab) Nasopharyngeal Nasopharyngeal Swab     Status: None   Collection Time: 01/28/20  9:04 AM   Specimen: Nasopharyngeal Swab  Result Value Ref Range Status   SARS Coronavirus 2 NEGATIVE NEGATIVE Final    Comment: (NOTE) SARS-CoV-2 target nucleic acids are NOT DETECTED.  The SARS-CoV-2 RNA is generally detectable in upper and lower respiratory specimens during the acute phase of infection. The lowest concentration of SARS-CoV-2 viral copies this assay can detect is 250 copies / mL. A negative result does not preclude SARS-CoV-2 infection and should not be used as the sole basis for treatment or other patient management decisions.  A negative result may occur with improper specimen collection / handling, submission of specimen other than nasopharyngeal swab, presence of viral mutation(s) within the areas targeted by this assay, and inadequate number of viral copies (<250 copies / mL). A negative result must be combined with clinical observations, patient history, and epidemiological information.  Fact Sheet for Patients:   StrictlyIdeas.no  Fact Sheet for Healthcare Providers: BankingDealers.co.za  This test is not yet approved or  cleared by the [place] FDA and has been authorized for detection and/or diagnosis of SARS-CoV-2 by FDA under an Emergency Use Authorization (EUA).  This EUA will remain in effect (meaning this test can be used) for the duration of the COVID-19 declaration under Section 564(b)(1) of the Act, 21 U.S.C. section 360bbb-3(b)(1), unless the authorization is terminated or revoked sooner.  Performed at [facility], [address] [place], [place] [address]   MRSA PCR Screening     Status: None   Collection Time: 01/29/20 10:10 AM   Specimen: Nasopharyngeal  Result Value Ref Range Status   MRSA by PCR NEGATIVE NEGATIVE Final    Comment:        The GeneXpert MRSA Assay (FDA approved  for NASAL specimens only), is one component of a comprehensive MRSA colonization surveillance program. It is not intended to diagnose MRSA infection nor to guide or monitor treatment for MRSA infections. Performed at [facility], [address] [place], [place] [address]   CULTURE, BLOOD (ROUTINE X 2) w Reflex to ID Panel     Status: None (Preliminary result)   Collection Time: 01/29/20  2:14 PM   Specimen: BLOOD  Result Value Ref Range Status   Specimen Description  BLOOD BLOOD LEFT HAND  Final   Special Requests   Final    BOTTLES DRAWN AEROBIC AND ANAEROBIC Blood Culture adequate volume   Culture   Final    NO GROWTH < 24 HOURS Performed at [facility], [address], [place], [place] [address]    Report Status PENDING  Incomplete  CULTURE, BLOOD (ROUTINE X 2) w Reflex to ID Panel     Status: None (Preliminary result)   Collection Time: 01/29/20  3:07 PM   Specimen: BLOOD  Result Value Ref Range Status   Specimen Description BLOOD BLOOD LEFT HAND  Final   Special Requests   Final    BOTTLES DRAWN AEROBIC AND ANAEROBIC Blood Culture results may not be optimal due to an excessive volume of blood received in culture bottles   Culture   Final    NO GROWTH < 24 HOURS Performed at [facility], [address], [place], [place] [address]    Report Status PENDING  Incomplete      Radiology Studies: US Venous Img Lower Unilateral Left (DVT)  Result Date: 01/28/2020 CLINICAL DATA:  Left leg pain and swelling EXAM: Left LOWER EXTREMITY VENOUS DOPPLER ULTRASOUND TECHNIQUE: Gray-scale sonography with compression, as well as color and duplex ultrasound, were performed to evaluate the deep venous system(s) from the level of the common femoral vein through the popliteal and proximal calf veins. COMPARISON:  None. FINDINGS: VENOUS Normal compressibility of the common femoral and superficial femoral veins. Noncompressible left popliteal vein with nonocclusive  thrombus present. Peroneal veins are not well demonstrated. Visualized portions of profunda femoral vein and great saphenous vein unremarkable. Doppler waveforms show normal direction of venous flow, normal respiratory plasticity and response to augmentation within the patent lower extremity veins. Limited views of the contralateral common femoral vein are unremarkable. OTHER Left groin lymph nodes measuring up to 4.7 cm. Fluid and edema within the subcutaneous soft tissues. Limitations: none IMPRESSION: Positive for acute nonocclusive DVT within the popliteal vein. These results will be called to the ordering clinician or representative by the Radiologist Assistant, and communication documented in the PACS or [employer]. Electronically Signed   By: Donavan Foil M.D.   On: 01/28/2020 15:32     Scheduled Meds: . aspirin EC  81 mg Oral QPM  . atorvastatin  80 mg Oral QHS  . Chlorhexidine Gluconate Cloth  6 each Topical Daily  . ferrous sulfate  325 mg Oral BID  . metoprolol tartrate  25 mg Oral BID  . midodrine  10 mg Oral TID WC  . pantoprazole  40 mg Oral Daily  . sodium bicarbonate  1,300 mg Oral TID  . sodium chloride flush  3 mL Intravenous Q12H   Continuous Infusions: . sodium chloride    . amiodarone 30 mg/hr (01/30/20 0554)  . dextrose 5 % and 0.9% NaCl 75 mL/hr at 01/30/20 0554  . heparin 1,100 Units/hr (01/30/20 0554)  . meropenem (MERREM) IV 500 mg (01/30/20 1054)     LOS: 2 days   Time Spent in minutes   45 minutes  Tuck Dulworth D.O. on 01/30/2020 at 11:54 AM  Between 7am to 7pm - Please see pager noted on [email or web address]  After 7pm go to [email or web address]  And look for the night coverage person covering for me after hours  [facility] Office  [phone]

## 2020-01-30 NOTE — Progress Notes (Signed)
[place] for heparin Indication: DVT  No Known Allergies  Patient Measurements: Height: 5\' 7"  (170.2 cm) Weight: 71.7 kg (158 lb 1.1 oz) IBW/kg (Calculated) : 66.1 Heparin Dosing Weight: 67 kg  Vital Signs: Temp: 97 F (36.1 C) (06/26 0156) Temp Source: Axillary (06/26 0156) BP: 89/59 (06/26 0600) Pulse Rate: 121 (06/26 0600)  Labs: Recent Labs    01/28/20 0425 01/28/20 2313 01/29/20 0255 01/29/20 1048 01/30/20 0449  HGB 8.2*  --  7.5*  --   --   HCT 26.5*  --  23.3*  --   --   PLT 306  --  242  --   --   APTT  --  64*  --   --   --   LABPROT  --  17.9*  --   --   --   INR  --  1.5*  --   --   --   HEPARINUNFRC  --   --  0.32 0.42 0.45  CREATININE 4.06*  --  3.89*  --   --   TROPONINIHS 11  --   --   --   --     Estimated Creatinine Clearance: 14.6 mL/min (A) (by C-G formula based on SCr of 3.89 mg/dL (H)).   Medical History: Past Medical History:  Diagnosis Date  . (HFpEF) heart failure with preserved ejection fraction ([place])    a. 03/2019 Echo: EF 60-65%, nl RV fxn.  Marland Kitchen AAA (abdominal aortic aneurysm) ([place]) 09/2019   3.0 infrarenal AAA incidentally noted on abdominal/pelvic CT  . Bladder cancer ([place])   . C. difficile colitis 04/2019  . CHF (congestive heart failure) ([place])   . CKD (chronic kidney disease), stage IV ([place])   . COPD (chronic obstructive pulmonary disease) ([place])   . Coronary artery disease    a. 10/2017 NSTEMI/Cath: LM 95m, 75d, LAD 75ost, 90p, D1 100, LCX 61m/d, OM1 90, RCA 25p; b. 10/2017 CABG x 4 ([facility]): LIMA->LAD, VG->Diag, VG->OM1, VG->OM2.  . Hyperlipidemia   . Hypertension   . NSVT (nonsustained ventricular tachycardia) ([place])    a. 06/2019 [place]: 2 runs of NSVT up to 12 beats, max rate 187 bpm.  . PSVT (paroxysmal supraventricular tachycardia) ([place])    06/2019 [facility]: 1820 episodes of SVT lasting up to 00:01:16 w/ max rate of 210.  . Type 2 diabetes mellitus [facility])     Assessment: 79 year old male with  ultrasound left lower extremity positive for acute nonocclusive DVT within the popliteal vein. Pharmacy consulted for heparin drip.  6/25 0255 HL 0.32, therapeutic.   6/25 1048 HL 0.42.  6/26 0449 HL 0.45, therapeutic  Goal of Therapy:  Heparin level 0.3-0.7 units/ml Monitor platelets by anticoagulation protocol: Yes   Plan:  Heparin level is therapeutic. Will continue Heparin drip at 1100 units/hr. Order anti-xa and CBC with AM labs.   Ena Dawley, PharmD 01/30/2020,6:28 AM

## 2020-01-30 NOTE — Progress Notes (Signed)
Pharmacy Antibiotic Note  Bryan Brewer is a 79 y.o. male admitted on 01/28/2020. Pharmacy has been consulted for meropenem dosing. Hx: bladder cancer status post cystoprostatectomy with urostomy and ileal conduit ?UTI, hx of ESBL  Plan: Meropenem 500 mg IV q12h  Height: 5\' 7"  (170.2 cm) Weight: 71.7 kg (158 lb 1.1 oz) IBW/kg (Calculated) : 66.1  Temp (24hrs), Avg:97.6 F (36.4 C), Min:97 F (36.1 C), Max:97.9 F (36.6 C)  Recent Labs  Lab 01/28/20 0425 01/28/20 0904 01/29/20 0255 01/30/20 0449 01/30/20 0730  WBC 14.5*  --  11.0*  --  13.7*  CREATININE 4.06*  --  3.89* 3.64*  --   LATICACIDVEN  --  1.4  --   --   --     Estimated Creatinine Clearance: 15.6 mL/min (A) (by C-G formula based on SCr of 3.64 mg/dL (H)).    No Known Allergies  Antimicrobials this admission: Meropenem 6/24 >>   Thank you for allowing pharmacy to be a part of this patient's care.  Kalman Nylen A, PharmD 01/30/2020 8:33 AM

## 2020-01-31 DIAGNOSIS — E871 Hypo-osmolality and hyponatremia: Secondary | ICD-10-CM

## 2020-01-31 LAB — CBC
HCT: 22.6 % — ABNORMAL LOW (ref 39.0–52.0)
Hemoglobin: 7.2 g/dL — ABNORMAL LOW (ref 13.0–17.0)
MCH: 26.6 pg (ref 26.0–34.0)
MCHC: 31.9 g/dL (ref 30.0–36.0)
MCV: 83.4 fL (ref 80.0–100.0)
Platelets: 286 10*3/uL (ref 150–400)
RBC: 2.71 MIL/uL — ABNORMAL LOW (ref 4.22–5.81)
RDW: 15.7 % — ABNORMAL HIGH (ref 11.5–15.5)
WBC: 15.6 10*3/uL — ABNORMAL HIGH (ref 4.0–10.5)
nRBC: 0 % (ref 0.0–0.2)

## 2020-01-31 LAB — BASIC METABOLIC PANEL
Anion gap: 11 (ref 5–15)
BUN: 94 mg/dL — ABNORMAL HIGH (ref 8–23)
CO2: 17 mmol/L — ABNORMAL LOW (ref 22–32)
Calcium: 7.8 mg/dL — ABNORMAL LOW (ref 8.9–10.3)
Chloride: 103 mmol/L (ref 98–111)
Creatinine, Ser: 3.48 mg/dL — ABNORMAL HIGH (ref 0.61–1.24)
GFR calc Af Amer: 18 mL/min — ABNORMAL LOW (ref >60)
GFR calc non Af Amer: 16 mL/min — ABNORMAL LOW (ref >60)
Glucose, Bld: 151 mg/dL — ABNORMAL HIGH (ref 70–99)
Potassium: 4.9 mmol/L (ref 3.5–5.1)
Sodium: 131 mmol/L — ABNORMAL LOW (ref 135–145)

## 2020-01-31 LAB — HEPARIN LEVEL (UNFRACTIONATED): Heparin Unfractionated: 0.36 IU/mL (ref 0.30–0.70)

## 2020-01-31 MED ORDER — VANCOMYCIN HCL IN DEXTROSE 1-5 GM/200ML-% IV SOLN
1000.0000 mg | INTRAVENOUS | Status: DC
  Administered 2020-01-31: 1000 mg via INTRAVENOUS
  Filled 2020-01-31 (×2): qty 200

## 2020-01-31 MED ORDER — MORPHINE SULFATE (PF) 2 MG/ML IV SOLN: 0.5000 mg | INTRAVENOUS | Status: DC | PRN

## 2020-01-31 NOTE — Progress Notes (Signed)
Progress Note  Patient Name: Bryan Brewer Date of Encounter: 01/31/2020  [facility] Cardiologist: Nelva Bush, MD    Subjective   79 year old gentleman with a history of coronary artery disease, CABG, chronic systolic congestive heart failure, paroxysmal SVT, abdominal aortic aneurysm, type 2 diabetes mellitus, CKD stage IV, anemia of chronic disease, hypertension, hyperlipidemia and COPD.  We are asked to see him yesterday for atrial flutter with a rapid ventricular response.  The patient was recently hospitalized with a hip fracture.  He returned to the hospital several days ago with weakness and nausea. CT angiogram of the chest was not performed because of his chronic kidney disease.  He was also felt to be too unstable to go down for VQ scan.  Venous duplex scan revealed acute nonocclusive DVT in the left popliteal vein.     Inpatient Medications    Scheduled Meds: . aspirin EC  81 mg Oral QPM  . atorvastatin  80 mg Oral QHS  . Chlorhexidine Gluconate Cloth  6 each Topical Daily  . ferrous sulfate  325 mg Oral BID  . metoprolol tartrate  25 mg Oral BID  . midodrine  10 mg Oral TID WC  . pantoprazole  40 mg Oral Daily  . sodium bicarbonate  1,300 mg Oral TID  . sodium chloride flush  3 mL Intravenous Q12H   Continuous Infusions: . sodium chloride 5 mL/hr at 01/31/20 0700  . amiodarone 30 mg/hr (01/31/20 0700)  . dextrose 5 % and 0.9% NaCl 50 mL/hr at 01/31/20 0700  . heparin 1,100 Units/hr (01/31/20 0700)  . meropenem (MERREM) IV 500 mg (01/31/20 1012)   PRN Meds: sodium chloride, acetaminophen, ipratropium-albuterol, ondansetron **OR** ondansetron (ZOFRAN) IV, sodium chloride flush, traMADol   Vital Signs    Vitals:   01/31/20 0500 01/31/20 0600 01/31/20 0700 01/31/20 0800  BP: (!) 83/56 (!) 100/58 (!) 105/54   Pulse: (!) 124 (!) 128 (!) 128   Resp: 14 12 12    Temp:    98.2 F (36.8 C)  TempSrc:    Oral  SpO2: 100% 100% 100%   Weight: 73.7 kg       Height:        Intake/Output Summary (Last 24 hours) at 01/31/2020 1137 Last data filed at 01/31/2020 0700 Gross per 24 hour  Intake 2504.46 ml  Output 500 ml  Net 2004.46 ml   Last 3 Weights 01/31/2020 01/29/2020 01/28/2020  Weight (lbs) 162 lb 7.7 oz 158 lb 1.1 oz 149 lb  Weight (kg) 73.7 kg 71.7 kg 67.586 kg      Telemetry    Atrial flutter with RVR  - Personally Reviewed  ECG      - Personally Reviewed  Physical Exam   Physical Exam: Blood pressure (!) 105/54, pulse (!) 128, temperature 98.2 F (36.8 C), temperature source Oral, resp. rate 12, height 5\' 7"  (1.702 m), weight 73.7 kg, SpO2 100 %.  GEN:  Elderly , frail male,  Pleasant  HEENT: Normal NECK: No JVD; No carotid bruits LYMPHATICS: No lymphadenopathy CARDIAC: RRR , tachy,  Soft systolic murmur  RESPIRATORY:  Clear to auscultation without rales, wheezing or rhonchi  ABDOMEN: Soft, non-tender, non-distended MUSCULOSKELETAL:  No edema; No deformity  SKIN: Warm and dry NEUROLOGIC:  Alert and oriented x 3   Labs    High Sensitivity Troponin:   Recent Labs  Lab 01/13/20 2134 01/14/20 0052 01/28/20 0425  TROPONINIHS 11 12 11       Chemistry Recent Labs  Lab 01/28/20 0425 01/28/20 0425 01/29/20 0255 01/30/20 0449 01/31/20 0421  NA 123*   < > 127* 130* 131*  K 4.7   < > 5.1 4.8 4.9  CL 92*   < > 98 101 103  CO2 17*   < > 17* 17* 17*  GLUCOSE 52*   < > 83 156* 151*  BUN 101*   < > 101* 95* 94*  CREATININE 4.06*   < > 3.89* 3.64* 3.48*  CALCIUM 7.5*   < > 7.3* 7.5* 7.8*  PROT 6.7  --   --   --   --   ALBUMIN 2.1*  --   --   --   --   AST 25  --   --   --   --   ALT 8  --   --   --   --   ALKPHOS 138*  --   --   --   --   BILITOT 1.2  --   --   --   --   GFRNONAA 13*   < > 14* 15* 16*  GFRAA 15*   < > 16* 17* 18*  ANIONGAP 14   < > 12 12 11    < > = values in this interval not displayed.     Hematology Recent Labs  Lab 01/29/20 0255 01/30/20 0730 01/31/20 0421  WBC 11.0* 13.7* 15.6*   RBC 2.79* 2.79* 2.71*  HGB 7.5* 7.6* 7.2*  HCT 23.3* 23.9* 22.6*  MCV 83.5 85.7 83.4  MCH 26.9 27.2 26.6  MCHC 32.2 31.8 31.9  RDW 14.8 15.3 15.7*  PLT 242 258 286    BNPNo results for input(s): BNP, PROBNP in the last 168 hours.   DDimer No results for input(s): DDIMER in the last 168 hours.   Radiology    No results found.  Cardiac Studies       Patient Profile     79 y.o. male with recent left hip fracture, admitted with new atrial flutter - possible PE.   Assessment & Plan    1.  Atrial flutter:   HR is still fast.   HR is difficult to control secondary to his underlying issues - infection , anemia .   2.  Left popliteal vein DVT: He has a nonocclusive DVT in his popliteal vein.  Is possible that he developed a larger clot which embolized to his lungs.  We did not do a CT angiogram because of his chronic kidney disease.  He also has not been stable enough to go down for VQ scan.  We will continue empiric therapy with heparin for now.  He denies any pleuritic chest pain.  He denies any shortness of breath.  3.  Hypotension :  Still is not eating well.  ( uneaten  breakfast is still on his table as of 11:41 am )  ...  4.  Left hip fracture :  Plans per primary team         For questions or updates, please contact [place] Please consult [email or web address] for contact info under        Signed, Mertie Moores, MD  01/31/2020, 11:37 AM

## 2020-01-31 NOTE — Progress Notes (Signed)
Late entry: Notified provider that patient has had D5NS infusing since 06/24 and has a h/o HF and his arms are beginning to be edematous and weep. Order modified to decrease IVF to 60ml/hr.

## 2020-01-31 NOTE — Plan of Care (Signed)
Patient admitted with hyponatremia on 01/28/2020, also found to have a DVT of the left leg. Patient was transferred to ICU for Afib RVR on 01/29/2020. Patient has heparin and amiodarone drip infusing. Patient also had a left hip injury that was repaired recently, steri strips still in place. Patient has urostomy in place draining strong foul smelling dark amber urine. Patient also has a stage II to coccyx and does need assistance with turning and moving in the bed.

## 2020-01-31 NOTE — Progress Notes (Signed)
ANTICOAGULATION CONSULT NOTE  Pharmacy Consult for heparin Indication: DVT  No Known Allergies  Patient Measurements: Height: 5\' 7"  (170.2 cm) Weight: 73.7 kg (162 lb 7.7 oz) IBW/kg (Calculated) : 66.1 Heparin Dosing Weight: 67 kg  Vital Signs: Temp: 98.3 F (36.8 C) (06/27 0000) Temp Source: Oral (06/27 0000) BP: 83/56 (06/27 0500) Pulse Rate: 124 (06/27 0500)  Labs: Recent Labs    01/28/20 2313 01/29/20 0255 01/29/20 0255 01/29/20 1048 01/30/20 0449 01/30/20 0730 01/31/20 0421  HGB  --  7.5*   < >  --   --  7.6* 7.2*  HCT  --  23.3*  --   --   --  23.9* 22.6*  PLT  --  242  --   --   --  258 286  APTT 64*  --   --   --   --   --   --   LABPROT 17.9*  --   --   --   --   --   --   INR 1.5*  --   --   --   --   --   --   HEPARINUNFRC  --  0.32   < > 0.42 0.45  --  0.36  CREATININE  --  3.89*  --   --  3.64*  --  3.48*   < > = values in this interval not displayed.    Estimated Creatinine Clearance: 16.4 mL/min (A) (by C-G formula based on SCr of 3.48 mg/dL (H)).   Medical History: Past Medical History:  Diagnosis Date  . (HFpEF) heart failure with preserved ejection fraction ([place])    a. 03/2019 Echo: EF 60-65%, nl RV fxn.  Marland Kitchen AAA (abdominal aortic aneurysm) ([place]) 09/2019   3.0 infrarenal AAA incidentally noted on abdominal/pelvic CT  . Bladder cancer ([place])   . C. difficile colitis 04/2019  . CHF (congestive heart failure) ([place])   . CKD (chronic kidney disease), stage IV ([place])   . COPD (chronic obstructive pulmonary disease) ([place])   . Coronary artery disease    a. 10/2017 NSTEMI/Cath: LM 89m, 75d, LAD 75ost, 90p, D1 100, LCX 15m/d, OM1 90, RCA 25p; b. 10/2017 CABG x 4 ([facility]): LIMA->LAD, VG->Diag, VG->OM1, VG->OM2.  . Hyperlipidemia   . Hypertension   . NSVT (nonsustained ventricular tachycardia) ([place])    a. 06/2019 [facility]: 2 runs of NSVT up to 12 beats, max rate 187 bpm.  . PSVT (paroxysmal supraventricular tachycardia) ([place])    06/2019 [facility]: 1820 episodes of SVT  lasting up to 00:01:16 w/ max rate of 210.  . Type 2 diabetes mellitus [facility])     Assessment: 79 year old male with ultrasound left lower extremity positive for acute nonocclusive DVT within the popliteal vein. Pharmacy consulted for heparin drip.  6/25 0255 HL 0.32, therapeutic.   6/25 1048 HL 0.42.  6/26 0449 HL 0.45, therapeutic 6/27 0421 HL 0.36, therapeutic.  CBC seems relatively stable but H/H low.  Goal of Therapy:  Heparin level 0.3-0.7 units/ml Monitor platelets by anticoagulation protocol: Yes   Plan:  Heparin level is therapeutic. Will continue Heparin drip at 1100 units/hr. Order anti-xa and CBC with AM labs.   Ena Dawley, PharmD 01/31/2020,5:49 AM

## 2020-01-31 NOTE — Progress Notes (Signed)
Pharmacy Antibiotic Note  Bryan Brewer is a 79 y.o. male admitted on 01/28/2020. Pharmacy has been consulted for Vancomycin dosing. Hx: bladder cancer status post cystoprostatectomy with urostomy and ileal conduit ?UTI, hx of ESBL  6/26: Ucx: >100,000 Enterococcus Faecium- sensitivities pending  Plan: Will begin Vancomycin 1000mg  IV q48h  (crcl 16.4 ml/min).  D/C meropenem F/u renal fxn and consider assess Vanc level before next dose    Height: 5\' 7"  (170.2 cm) Weight: 73.7 kg (162 lb 7.7 oz) IBW/kg (Calculated) : 66.1  Temp (24hrs), Avg:98 F (36.7 C), Min:97.3 F (36.3 C), Max:98.3 F (36.8 C)  Recent Labs  Lab 01/28/20 0425 01/28/20 0904 01/29/20 0255 01/30/20 0449 01/30/20 0730 01/31/20 0421  WBC 14.5*  --  11.0*  --  13.7* 15.6*  CREATININE 4.06*  --  3.89* 3.64*  --  3.48*  LATICACIDVEN  --  1.4  --   --   --   --     Estimated Creatinine Clearance: 16.4 mL/min (A) (by C-G formula based on SCr of 3.48 mg/dL (H)).    No Known Allergies  Antimicrobials this admission: Meropenem 6/24 >> 6/27 Vanc 6/27 >>   Thank you for allowing pharmacy to be a part of this patient's care.  Lashunta Frieden A, PharmD 01/31/2020 3:50 PM

## 2020-01-31 NOTE — Progress Notes (Signed)
PROGRESS NOTE    Bryan Brewer  ZWC:[id number] DOB: [birth year] DOA: 01/28/2020 PCP: Tracie Harrier, MD   Brief Narrative:  HPI on 01/28/2020 by Dr. Aretta Nip Agbata Bryan Brewer is a 79 y.o. male with medical history significant forCAD, HFpEF, CKD 4, COPD,bladder cancer status post cystoprostatectomy with urostomy and ileal conduit,CADs/pCABG who presents to the emergency room for evaluation of weakness and nausea.  Patient and her spouse were also concerned that he had some blood in his urine and had been on a blood thinner since he was discharged from the hospital following an intramedullary nail placement in his left hip. He denies having any fever or chills, denies having any chest pain, abdominal pain or changes in his bowel habits. Labs revealed worsening of his renal function from baseline, his serum creatinine on discharge was 2.86 and today on admission it is 4.06, sodium is low at 123 and he has a white count of 14.5 with a hemoglobin of 8.2 that is chronic. Chest x-ray shows no acute findings and he has pyuria that appears to be chronic. Patient has swelling of his left lower extremity and venous Doppler shows Positive for acute nonocclusive DVT within the popliteal vein.  Assessment & Plan   New onset Atrial fibrillation -Rate had noted to be up to 140s yesterday, patient was placed on amiodarone drip as well as heparin drip -Cardiology consulted and appreciated -Atrial fibrillation treatment complicated by hypotension -Echocardiogram ordered -TSH 1.818 (see WNL) -Continue to keep potassium at 4 and magnesium 2 -Currently on heparin  Hypotension -Has moved patient to ICU -Given IV fluid bolus -Placed on midodrine 10 mg 3 times daily -Blood pressures remain soft today  Sepsis secondary to UTI -Present on admission, patient was noted to have leukocytosis with tachycardia -UA on admission: Showed moderate leukocytes, 21-50 WBC, no bacteria seen -Unfortunately urine  culture was not done, urine culture pending -Blood cultures show no growth to date (however were taken after antibiotics were started) -Patient placed on Merrem for history of ESBL -continues to have leukocytosis and tachycardia  Acute left lower extremity DVT -Patient with recent left hip fracture status post intramedullary nail and nonadherence to Lovenox DVT prophylaxis -Extremity Doppler positive for acute nonocclusive DVT within the popliteal vein -Do not think patient needs VQ scan given that he will need to be on anticoagulation for DVT and AF -Place patient on heparin drip  Acute kidney injury on chronic kidney disease, stage IV -Baseline creatinine approximately 3.4, however on admission was 4.06 -Placed on IV fluids, creatinine currently down to 3.48 -Continue to monitor BMP  Hyponatremia -Sodium was 123 on admission -Based on IV fluids, sodium up to 131 -Continue to monitor BMP  Chronic combined systolic and diastolic heart failure -Appears to be euvolemic -Echocardiogram on 03/31/2019 shows an EF of 60 to 65% -Continue to monitor intake and output, daily weights  COPD -Appears to be stable, will continue to monitor  Acute on chronic anemia of chronic disease -Hemoglobin down to 7.2, will continue to monitor CBC  Diabetes mellitus, type II with hypoglycemic events -Last hemoglobin A1c 6.4 on 01/13/2020 -Continue D5   DVT Prophylaxis Heparin  Code Status: Full  Family Communication: None at bedside.  Wife via phone  Disposition Plan:  Status is: Inpatient  Remains inpatient appropriate because:Hemodynamically unstable, Ongoing diagnostic testing needed not appropriate for outpatient work up, IV treatments appropriate due to intensity of illness or inability to take PO and Inpatient level of care appropriate due to severity  of illness   Dispo: The patient is from: Home              Anticipated d/c is to: TBD              Anticipated d/c date is: > 3 days               Patient currently is not medically stable to d/c.  Consultants Cardiology PCCM  Procedures  Left lower extremity Doppler  Antibiotics   Anti-infectives (From admission, onward)   Start     Dose/Rate Route Frequency Ordered Stop   01/28/20 1800  meropenem (MERREM) 500 mg in sodium chloride 0.9 % 100 mL IVPB     Discontinue     500 mg 200 mL/hr over 30 Minutes Intravenous Every 12 hours 01/28/20 1737        Subjective:   Bryan Brewer seen and examined today.  Patient denies chest pain, or shortness of breath this morning.  Denies current abdominal pain, nausea or vomiting, diarrhea or constipation, dizziness or headache.    Objective:   Vitals:   01/31/20 0500 01/31/20 0600 01/31/20 0700 01/31/20 0800  BP: (!) 83/56 (!) 100/58 (!) 105/54   Pulse: (!) 124 (!) 128 (!) 128   Resp: 14 12 12    Temp:    98.2 F (36.8 C)  TempSrc:    Oral  SpO2: 100% 100% 100%   Weight: 73.7 kg     Height:        Intake/Output Summary (Last 24 hours) at 01/31/2020 1313 Last data filed at 01/31/2020 0700 Gross per 24 hour  Intake 2504.46 ml  Output 500 ml  Net 2004.46 ml   Filed Weights   01/28/20 0417 01/29/20 1000 01/31/20 0500  Weight: 67.6 kg 71.7 kg 73.7 kg   Exam  General: Well developed, chronically ill-appearing, NAD  HEENT: NCAT, mucous membranes moist.   Cardiovascular: S1 S2 auscultated, irregularly irregular, soft murmur  Respiratory: Diminished breath sounds  Abdomen: Soft, nontender, nondistended, + bowel sounds  Extremities: warm dry without cyanosis clubbing or edema. bruising on lower extremities  Neuro: AAOx3, nonfocal  Psych: Appropriate mood and affect, pleasant  Data Reviewed: I have personally reviewed following labs and imaging studies  CBC: Recent Labs  Lab 01/28/20 0425 01/29/20 0255 01/30/20 0730 01/31/20 0421  WBC 14.5* 11.0* 13.7* 15.6*  NEUTROABS 13.4*  --   --   --   HGB 8.2* 7.5* 7.6* 7.2*  HCT 26.5* 23.3* 23.9* 22.6*  MCV 87.7  83.5 85.7 83.4  PLT 306 242 258 885   Basic Metabolic Panel: Recent Labs  Lab 01/28/20 0425 01/29/20 0255 01/29/20 1414 01/30/20 0449 01/31/20 0421  NA 123* 127*  --  130* 131*  K 4.7 5.1  --  4.8 4.9  CL 92* 98  --  101 103  CO2 17* 17*  --  17* 17*  GLUCOSE 52* 83  --  156* 151*  BUN 101* 101*  --  95* 94*  CREATININE 4.06* 3.89*  --  3.64* 3.48*  CALCIUM 7.5* 7.3*  --  7.5* 7.8*  MG  --   --  1.8  --   --    GFR: Estimated Creatinine Clearance: 16.4 mL/min (A) (by C-G formula based on SCr of 3.48 mg/dL (H)). Liver Function Tests: Recent Labs  Lab 01/28/20 0425  AST 25  ALT 8  ALKPHOS 138*  BILITOT 1.2  PROT 6.7  ALBUMIN 2.1*   No results for input(s): LIPASE,  AMYLASE in the last 168 hours. No results for input(s): AMMONIA in the last 168 hours. Coagulation Profile: Recent Labs  Lab 01/28/20 2313  INR 1.5*   Cardiac Enzymes: No results for input(s): CKTOTAL, CKMB, CKMBINDEX, TROPONINI in the last 168 hours. BNP (last 3 results) No results for input(s): PROBNP in the last 8760 hours. HbA1C: No results for input(s): HGBA1C in the last 72 hours. CBG: Recent Labs  Lab 01/28/20 2201 01/29/20 0244 01/29/20 0558 01/29/20 0759 01/29/20 1004  GLUCAP 85 85 74 118* 189*   Lipid Profile: No results for input(s): CHOL, HDL, LDLCALC, TRIG, CHOLHDL, LDLDIRECT in the last 72 hours. Thyroid Function Tests: Recent Labs    01/29/20 1048  TSH 1.818   Anemia Panel: No results for input(s): VITAMINB12, FOLATE, FERRITIN, TIBC, IRON, RETICCTPCT in the last 72 hours. Urine analysis:    Component Value Date/Time   COLORURINE YELLOW (A) 01/28/2020 0959   APPEARANCEUR CLOUDY (A) 01/28/2020 0959   LABSPEC 1.010 01/28/2020 0959   PHURINE 7.0 01/28/2020 0959   GLUCOSEU NEGATIVE 01/28/2020 0959   HGBUR SMALL (A) 01/28/2020 0959   BILIRUBINUR NEGATIVE 01/28/2020 0959   KETONESUR NEGATIVE 01/28/2020 0959   PROTEINUR 100 (A) 01/28/2020 0959   NITRITE NEGATIVE 01/28/2020  0959   LEUKOCYTESUR MODERATE (A) 01/28/2020 0959   Sepsis Labs: @LABRCNTIP (procalcitonin:4,lacticidven:4)  ) Recent Results (from the past 240 hour(s))  SARS Coronavirus 2 by RT PCR (hospital order, performed in [facility] hospital lab) Nasopharyngeal Nasopharyngeal Swab     Status: None   Collection Time: 01/28/20  9:04 AM   Specimen: Nasopharyngeal Swab  Result Value Ref Range Status   SARS Coronavirus 2 NEGATIVE NEGATIVE Final    Comment: (NOTE) SARS-CoV-2 target nucleic acids are NOT DETECTED.  The SARS-CoV-2 RNA is generally detectable in upper and lower respiratory specimens during the acute phase of infection. The lowest concentration of SARS-CoV-2 viral copies this assay can detect is 250 copies / mL. A negative result does not preclude SARS-CoV-2 infection and should not be used as the sole basis for treatment or other patient management decisions.  A negative result may occur with improper specimen collection / handling, submission of specimen other than nasopharyngeal swab, presence of viral mutation(s) within the areas targeted by this assay, and inadequate number of viral copies (<250 copies / mL). A negative result must be combined with clinical observations, patient history, and epidemiological information.  Fact Sheet for Patients:   StrictlyIdeas.no  Fact Sheet for Healthcare Providers: BankingDealers.co.za  This test is not yet approved or  cleared by the [place] FDA and has been authorized for detection and/or diagnosis of SARS-CoV-2 by FDA under an Emergency Use Authorization (EUA).  This EUA will remain in effect (meaning this test can be used) for the duration of the COVID-19 declaration under Section 564(b)(1) of the Act, 21 U.S.C. section 360bbb-3(b)(1), unless the authorization is terminated or revoked sooner.  Performed at [facility], [address] [place], [place] [address]     MRSA PCR Screening     Status: None   Collection Time: 01/29/20 10:10 AM   Specimen: Nasopharyngeal  Result Value Ref Range Status   MRSA by PCR NEGATIVE NEGATIVE Final    Comment:        The GeneXpert MRSA Assay (FDA approved for NASAL specimens only), is one component of a comprehensive MRSA colonization surveillance program. It is not intended to diagnose MRSA infection nor to guide or monitor treatment for MRSA infections. Performed at [facility], [address], [place], [place] [address]   CULTURE, BLOOD (ROUTINE X 2) w Reflex to ID Panel     Status: None (Preliminary result)   Collection Time: 01/29/20  2:14 PM   Specimen: BLOOD  Result Value Ref Range Status   Specimen Description BLOOD BLOOD LEFT HAND  Final   Special Requests   Final    BOTTLES DRAWN AEROBIC AND ANAEROBIC Blood Culture adequate volume   Culture   Final    NO GROWTH 2 DAYS Performed at [facility], [address], [place], [place] [address]    Report Status PENDING  Incomplete  CULTURE, BLOOD (ROUTINE X 2) w Reflex to ID Panel     Status: None (Preliminary result)   Collection Time: 01/29/20  3:07 PM   Specimen: BLOOD  Result Value Ref Range Status   Specimen Description BLOOD BLOOD LEFT HAND  Final   Special Requests   Final    BOTTLES DRAWN AEROBIC AND ANAEROBIC Blood Culture results may not be optimal due to an excessive volume of blood received in culture bottles   Culture   Final    NO GROWTH 2 DAYS Performed at [facility] [address] [place], [place] [address]    Report Status PENDING  Incomplete  Urine Culture     Status: None (Preliminary result)   Collection Time: 01/30/20  1:56 PM   Specimen: Urine, Clean Catch  Result Value Ref Range Status   Specimen Description   Final    URINE, CLEAN CATCH Performed at [facility], [address] [place], [place] [address]    Special Requests   Final    NONE Performed at [facility], [address], [place], [place] [address]    Culture   Final    CULTURE REINCUBATED FOR BETTER GROWTH Performed at [facility], [place] [address], [place],  [address]    Report Status PENDING  Incomplete      Radiology Studies: No results found.   Scheduled Meds:  aspirin EC  81 mg Oral QPM   atorvastatin  80 mg Oral QHS   Chlorhexidine Gluconate Cloth  6 each Topical Daily   ferrous sulfate  325 mg Oral BID   metoprolol tartrate  25 mg Oral BID   midodrine  10 mg Oral TID WC   pantoprazole  40 mg Oral Daily   sodium bicarbonate  1,300 mg Oral TID   sodium chloride flush  3 mL Intravenous Q12H   Continuous Infusions:  sodium chloride 5 mL/hr at 01/31/20 0700   amiodarone 30 mg/hr (01/31/20 0700)   dextrose 5 % and 0.9% NaCl 50 mL/hr at 01/31/20 0700   heparin 1,100 Units/hr (01/31/20 0700)   meropenem (MERREM) IV 500 mg (01/31/20 1012)     LOS: 3 days   Time Spent in minutes   45 minutes  Kourtnei Rauber D.O. on 01/31/2020 at 1:13 PM  Between 7am to 7pm - Please see pager noted on [email or web address]  After 7pm go to [email or web address]  And look for the night coverage person covering for me after hours  [facility] Office  [phone]

## 2020-02-01 ENCOUNTER — Inpatient Hospital Stay: Payer: Medicare HMO

## 2020-02-01 ENCOUNTER — Inpatient Hospital Stay (HOSPITAL_COMMUNITY)
Admit: 2020-02-01 | Discharge: 2020-02-01 | Disposition: A | Discharge status: Home / Self Care | Payer: Medicare HMO | Attending: Cardiovascular Disease | Admitting: Cardiovascular Disease

## 2020-02-01 DIAGNOSIS — I340 Nonrheumatic mitral (valve) insufficiency: Secondary | ICD-10-CM

## 2020-02-01 DIAGNOSIS — Z7189 Other specified counseling: Secondary | ICD-10-CM

## 2020-02-01 DIAGNOSIS — Z515 Encounter for palliative care: Secondary | ICD-10-CM

## 2020-02-01 DIAGNOSIS — Z66 Do not resuscitate: Secondary | ICD-10-CM

## 2020-02-01 DIAGNOSIS — I361 Nonrheumatic tricuspid (valve) insufficiency: Secondary | ICD-10-CM

## 2020-02-01 DIAGNOSIS — I429 Cardiomyopathy, unspecified: Secondary | ICD-10-CM

## 2020-02-01 LAB — CBC
HCT: 22.2 % — ABNORMAL LOW (ref 39.0–52.0)
Hemoglobin: 7 g/dL — ABNORMAL LOW (ref 13.0–17.0)
MCH: 26.8 pg (ref 26.0–34.0)
MCHC: 31.5 g/dL (ref 30.0–36.0)
MCV: 85.1 fL (ref 80.0–100.0)
Platelets: 271 10*3/uL (ref 150–400)
RBC: 2.61 MIL/uL — ABNORMAL LOW (ref 4.22–5.81)
RDW: 15.9 % — ABNORMAL HIGH (ref 11.5–15.5)
WBC: 16.3 10*3/uL — ABNORMAL HIGH (ref 4.0–10.5)
nRBC: 0 % (ref 0.0–0.2)

## 2020-02-01 LAB — URINE CULTURE: Culture: >=100000 — AB

## 2020-02-01 LAB — ECHOCARDIOGRAM COMPLETE
Height: 67 in
Weight: 2617.3 oz

## 2020-02-01 LAB — BASIC METABOLIC PANEL
Anion gap: 12 (ref 5–15)
BUN: 88 mg/dL — ABNORMAL HIGH (ref 8–23)
CO2: 16 mmol/L — ABNORMAL LOW (ref 22–32)
Calcium: 7.9 mg/dL — ABNORMAL LOW (ref 8.9–10.3)
Chloride: 105 mmol/L (ref 98–111)
Creatinine, Ser: 3.58 mg/dL — ABNORMAL HIGH (ref 0.61–1.24)
GFR calc Af Amer: 18 mL/min — ABNORMAL LOW (ref >60)
GFR calc non Af Amer: 15 mL/min — ABNORMAL LOW (ref >60)
Glucose, Bld: 138 mg/dL — ABNORMAL HIGH (ref 70–99)
Potassium: 5 mmol/L (ref 3.5–5.1)
Sodium: 133 mmol/L — ABNORMAL LOW (ref 135–145)

## 2020-02-01 LAB — HEPARIN LEVEL (UNFRACTIONATED): Heparin Unfractionated: 0.35 IU/mL (ref 0.30–0.70)

## 2020-02-01 MED ORDER — SODIUM BICARBONATE-DEXTROSE 150-5 MEQ/L-% IV SOLN
150.0000 meq | INTRAVENOUS | Status: DC
  Administered 2020-02-01 – 2020-02-02 (×2): 150 meq via INTRAVENOUS
  Filled 2020-02-01 (×3): qty 1000

## 2020-02-01 MED ORDER — HEPARIN SODIUM (PORCINE) 1000 UNIT/ML IJ SOLN: 1000.0000 [IU] | INTRAMUSCULAR | Status: DC | PRN

## 2020-02-01 MED ORDER — MORPHINE SULFATE (PF) 2 MG/ML IV SOLN
1.0000 mg | Freq: Once | INTRAVENOUS | Status: AC
  Administered 2020-02-01: 1 mg via INTRAVENOUS

## 2020-02-01 MED ORDER — MORPHINE SULFATE (PF) 2 MG/ML IV SOLN
2.0000 mg | INTRAVENOUS | Status: DC | PRN
  Administered 2020-02-01 – 2020-02-02 (×2): 2 mg via INTRAVENOUS
  Filled 2020-02-01 (×2): qty 1

## 2020-02-01 MED ORDER — MORPHINE 100MG IN NS 100ML (1MG/ML) PREMIX INFUSION: 1.0000 mg/h | Freq: Once | INTRAVENOUS | Status: DC

## 2020-02-01 MED ORDER — MORPHINE SULFATE (PF) 2 MG/ML IV SOLN: 1.0000 mg | Freq: Once | INTRAVENOUS | Status: AC

## 2020-02-01 MED ORDER — MORPHINE SULFATE (PF) 2 MG/ML IV SOLN
INTRAVENOUS | Status: AC
  Administered 2020-02-01: 1 mg via INTRAVENOUS
  Filled 2020-02-01: qty 1

## 2020-02-01 NOTE — Progress Notes (Signed)
*  PRELIMINARY RESULTS* Echocardiogram 2D Echocardiogram has been performed.  [name] 02/01/2020, 9:11 AM

## 2020-02-01 NOTE — Consult Note (Signed)
Consultation Note Date: 02/01/2020   Patient Name: Bryan Brewer  DOB: 20-Oct-1940  MRN: [id number]  Age / Sex: 79 y.o., male  PCP: Tracie Harrier, MD Referring Physician: Cristal Ford, DO  Reason for Consultation: Establishing goals of care  HPI/Patient Profile: 79 y.o. male  with past medical history of CAD s/p CABG, HF, CKD 4, COPD, T2DM, and bladder cancer s/p cystoprostatectomy with urostomy and ileal conduit admitted on 01/28/2020 with weakness and nausea.  Patient recently discharged earlier this month following intramedullary nail placement in left hip. On admission found to have a fib RVR. Labs reveal worsening renal function, anemia, and hyponatremia. Venous doppler revealed left lower extremity DVT. Patient has had hypotension throughout hospitalizations. Also being treated for urosepsis. Echo 6/28 with EF 35-40%. PMT consulted for [place].   Clinical Assessment and Goals of Care: I have reviewed medical records including EPIC notes, labs and imaging, received report from RN, assessed the patient and then met with patient, wife, and cousin to discuss diagnosis prognosis, GOC, EOL wishes, disposition and options.  I introduced Palliative Medicine as specialized medical care for people living with serious illness. It focuses on providing relief from the symptoms and stress of a serious illness. The goal is to improve quality of life for both the patient and the family.  Patient tells me 59th wedding anniversary is Wednesday. He is tearful - talks about hoping to live until then.   They tell me patient has been doing well prior to hospitalization - good functional status. They speak of his hip fracture following a fall earlier this month but had recovered well from it.    We discussed patient's current illness and what it means in the larger context of patient's on-going co-morbidities.  Natural disease trajectory and expectations at EOL  were discussed. Discussed his multiple medical problems. Patient and family both express awareness of serious illness and concern about prognosis.   Patient and family both express a desire to continue full scope treatment. However, they do set a limit of DNR/DNI. We specifically discussed dialysis, initially patient expresses interest in dialysis but as we discussed further he is unsure - wife is unsure as well. We discussed that there is no need for an urgent decision and they can discuss among themselves and we will follow up tomorrow. Previously, patient has stated he would not want dialysis.   Discussed with patient/family the importance of continued conversation with family and the medical providers regarding overall plan of care and treatment options, ensuring decisions are within the context of the patients values and GOCs.    Questions and concerns were addressed. The family was encouraged to call with questions or concerns.   Primary Decision Maker PATIENT   SUMMARY OF RECOMMENDATIONS   - initial palliative conversation - patient and family with good understanding of severity of illness - code status changed to DNR - palliative will follow - to see 6/29  Code Status/Advance Care Planning:  DNR  Discharge Planning: To Be Determined      Primary Diagnoses: Present on Admission:  Hyponatremia  Sepsis secondary to UTI ([place])  Acute on chronic renal failure (HCC)  COPD (chronic obstructive pulmonary disease) ([place])  DVT (deep venous thrombosis) ([place])   I have reviewed the medical record, interviewed the patient and family, and examined the patient. The following aspects are pertinent.  Past Medical History:  Diagnosis Date   (HFpEF) heart failure with preserved ejection fraction ([place])    a. 03/2019 Echo: EF 60-65%,  nl RV fxn.   AAA (abdominal aortic aneurysm) ([place]) 09/2019   3.0 infrarenal AAA incidentally noted on abdominal/pelvic CT   Bladder cancer (HCC)    C.  difficile colitis 04/2019   CHF (congestive heart failure) (HCC)    CKD (chronic kidney disease), stage IV (HCC)    COPD (chronic obstructive pulmonary disease) (HCC)    Coronary artery disease    a. 10/2017 NSTEMI/Cath: LM 92m 75d, LAD 75ost, 90p, D1 100, LCX 767m, OM1 90, RCA 25p; b. 10/2017 CABG x 4 ([facility]): LIMA->LAD, VG->Diag, VG->OM1, VG->OM2.   Hyperlipidemia    Hypertension    NSVT (nonsustained ventricular tachycardia) ([place]   a. 06/2019 [facility]: 2 runs of NSVT up to 12 beats, max rate 187 bpm.   PSVT (paroxysmal supraventricular tachycardia) ([place]   06/2019 [facility]: 1820 episodes of SVT lasting up to 00:01:16 w/ max rate of 210.   Type 2 diabetes mellitus (HCC)    Social History   Socioeconomic History   Marital status: Married    Spouse name: Not on file   Number of children: Not on file   Years of education: Not on file   Highest education level: Not on file  Occupational History   Not on file  Tobacco Use   Smoking status: Former Smoker    Packs/day: 1.50    Years: 35.00    Pack years: 52.50    Types: Cigarettes    Quit date: 2013    Years since quitting: 8.4   Smokeless tobacco: Never Used  VaScientific laboratory technicianse: Never used  Substance and Sexual Activity   Alcohol use: No   Drug use: Never   Sexual activity: Not Currently  Other Topics Concern   Not on file  Social History Narrative   Not on file   Social Determinants of Health   Financial Resource Strain: Low Risk    Difficulty of Paying Living Expenses: Not hard at all  Food Insecurity: Unknown   Worried About RuCharity fundraisern the Last Year: Patient refused   RaArboriculturistn the Last Year: Patient refused  Transportation Needs: Unknown   LaFilm/video editorMedical): Patient refused   Lack of Transportation (Non-Medical): Patient refused  Physical Activity: Unknown   Days of Exercise per Week: Patient refused   Minutes of Exercise per Session: Patient refused    Stress: Unknown   Feeling of Stress : Patient refused  Social Connections: Unknown   Frequency of Communication with Friends and Family: Patient refused   Frequency of Social Gatherings with Friends and Family: Patient refused   Attends Religious Services: Patient refused   AcMarine scientistr Organizations: Patient refused   Attends ClMusic therapistPatient refused   Marital Status: Patient refused   Family History  Problem Relation Age of Onset   CAD Mother    Stroke Mother    CAD Father    Stroke Father    Breast cancer Sister    Scheduled Meds:  aspirin EC  81 mg Oral QPM   atorvastatin  80 mg Oral QHS   Chlorhexidine Gluconate Cloth  6 each Topical Daily   ferrous sulfate  325 mg Oral BID   metoprolol tartrate  25 mg Oral BID   midodrine  10 mg Oral TID WC   pantoprazole  40 mg Oral Daily   sodium bicarbonate  1,300 mg Oral TID   sodium chloride flush  3 mL Intravenous  Q12H   Continuous Infusions:  sodium chloride Stopped (01/31/20 2015)   amiodarone 30 mg/hr (02/01/20 0700)   heparin 1,100 Units/hr (02/01/20 0700)   sodium bicarbonate 150 mEq in dextrose 5% 1000 mL 150 mEq (02/01/20 1241)   vancomycin Stopped (01/31/20 1900)   PRN Meds:.sodium chloride, acetaminophen, heparin sodium (porcine), ipratropium-albuterol, ondansetron **OR** ondansetron (ZOFRAN) IV, sodium chloride flush, traMADol No Known Allergies Review of Systems  Constitutional: Positive for activity change, appetite change and fatigue.  Musculoskeletal:       R leg pain - better after morphine  Neurological: Positive for weakness.    Physical Exam Constitutional:      General: He is not in acute distress. Cardiovascular:     Rate and Rhythm: Tachycardia present.  Pulmonary:     Effort: Pulmonary effort is normal. No respiratory distress.  Skin:    General: Skin is warm and dry.  Neurological:     Mental Status: He is alert and oriented to  person, place, and time.     Vital Signs: BP (!) 92/50    Pulse (!) 124    Temp 97.6 F (36.4 C) (Oral)    Resp 11    Ht _0  (1.702 m)    Wt 74.2 kg    SpO2 100%    BMI 25.62 kg/m  Pain Scale: 0-10   Pain Score: 0-No pain   SpO2: SpO2: 100 % O2 Device:SpO2: 100 % O2 Flow Rate: .O2 Flow Rate (L/min): 2 L/min  IO: Intake/output summary:   Intake/Output Summary (Last 24 hours) at 02/01/2020 1409 Last data filed at 02/01/2020 0700 Gross per 24 hour  Intake 2236.95 ml  Output 1250 ml  Net 986.95 ml    LBM: Last BM Date: 01/31/20 Baseline Weight: Weight: 67.6 kg Most recent weight: Weight: 74.2 kg     Palliative Assessment/Data: PPS 40%    Time Total: 50 minutes Greater than 50%  of this time was spent counseling and coordinating care related to the above assessment and plan.  Juel Burrow, DNP, AGNP-C Palliative Medicine Team [phone] Pager: [phone]

## 2020-02-01 NOTE — Procedures (Addendum)
Central Venous Dailysis Catheter Placement: TRIPLE LUMEN  Indication: Hemo Dialysis/CRRT   Consent:emergent   Hand washing performed prior to starting the procedure.   Procedure: An active timeout was performed and correct patient, name, & ID confirmed. Patient was positioned correctly for central venous access. Patient was prepped using strict sterile technique including chlorohexadine preps, sterile drape, sterile gown and sterile gloves.  The area was prepped, draped and anesthetized in the usual sterile manner. Patient comfort was obtained.    A Double lumen catheter was placed in RT  Vein There was good blood return, catheter caps were placed on lumens, catheter flushed easily, the line was secured and a sterile dressing and BIO-PATCH applied.   Ultrasound was used to visualize vasculature and guidance of needle.   Number of Attempts: 1 Complications:none  Estimated Blood Loss: none Operator: Baltazar Pekala.  THE LINE WAS IN RT SUBCLAVIAN VEIN, THE LINE WAS ADJUSTED UNDER STERILE PROCEDURE_AFTER RE-INSERTING GUIDEWIRE, I  I MANIPULATED THE CATHETER TO BE REPOSITIONED INTO THE RT IJ VEIN.     Corrin Parker, M.D.  [facility]  Medical Director [place] Director [facility] Cardio-Pulmonary Department

## 2020-02-01 NOTE — Progress Notes (Signed)
[facility]  ROUNDING NOTE   Subjective:   [name] admitted to [facility] on 01/28/2020 for Hyponatremia [E87.1] Weakness [R53.1] Localized swelling of left lower leg [R22.42]  Nephrology consulted for worsening renal failure.   Objective:  Vital signs in last 24 hours:  Temp:  [97.6 F (36.4 C)-98.1 F (36.7 C)] 97.6 F (36.4 C) (06/28 0055) Pulse Rate:  [84-124] 124 (06/28 0500) Resp:  [10-16] 11 (06/28 0500) BP: (90-108)/(48-69) 92/50 (06/28 0500) SpO2:  [100 %] 100 % (06/28 0500) Weight:  [74.2 kg] 74.2 kg (06/28 0500)  Weight change: 0.5 kg Filed Weights   01/29/20 1000 01/31/20 0500 02/01/20 0500  Weight: 71.7 kg 73.7 kg 74.2 kg    Intake/Output: I/O last 3 completed shifts: In: 3561.7 [P.O.:240; I.V.:3021.7; IV Piggyback:300] Out: 1250 [Urine:1250]   Intake/Output this shift:  No intake/output data recorded.  Physical Exam: General: NAD,   Head: Normocephalic, atraumatic. Moist oral mucosal membranes  Eyes: Anicteric, PERRL  Neck: Supple, trachea midline  Lungs:  Clear to auscultation  Heart: Regular rate and rhythm  Abdomen:  Soft, nontender,   Extremities: trace peripheral edema.  Neurologic: Nonfocal, moving all four extremities  Skin: No lesions   GU Ileostomy with foley bag. Urine is purple in color    Basic Metabolic Panel: Recent Labs  Lab 01/28/20 0425 01/28/20 0425 01/29/20 0255 01/29/20 0255 01/29/20 1414 01/30/20 0449 01/31/20 0421 02/01/20 0310  NA 123*  --  127*  --   --  130* 131* 133*  K 4.7  --  5.1  --   --  4.8 4.9 5.0  CL 92*  --  98  --   --  101 103 105  CO2 17*  --  17*  --   --  17* 17* 16*  GLUCOSE 52*  --  83  --   --  156* 151* 138*  BUN 101*  --  101*  --   --  95* 94* 88*  CREATININE 4.06*  --  3.89*  --   --  3.64* 3.48* 3.58*  CALCIUM 7.5*   < > 7.3*   < >  --  7.5* 7.8* 7.9*  MG  --   --   --   --  1.8  --   --   --    < > = values in this interval not displayed.    Liver Function  Tests: Recent Labs  Lab 01/28/20 0425  AST 25  ALT 8  ALKPHOS 138*  BILITOT 1.2  PROT 6.7  ALBUMIN 2.1*   No results for input(s): LIPASE, AMYLASE in the last 168 hours. No results for input(s): AMMONIA in the last 168 hours.  CBC: Recent Labs  Lab 01/28/20 0425 01/29/20 0255 01/30/20 0730 01/31/20 0421 02/01/20 0310  WBC 14.5* 11.0* 13.7* 15.6* 16.3*  NEUTROABS 13.4*  --   --   --   --   HGB 8.2* 7.5* 7.6* 7.2* 7.0*  HCT 26.5* 23.3* 23.9* 22.6* 22.2*  MCV 87.7 83.5 85.7 83.4 85.1  PLT 306 242 258 286 271    Cardiac Enzymes: No results for input(s): CKTOTAL, CKMB, CKMBINDEX, TROPONINI in the last 168 hours.  BNP: Invalid input(s): POCBNP  CBG: Recent Labs  Lab 01/28/20 2201 01/29/20 0244 01/29/20 0558 01/29/20 0759 01/29/20 1004  GLUCAP 85 85 74 118* 189*    Microbiology: Results for orders placed or performed during the hospital encounter of 01/28/20  SARS Coronavirus 2 by RT PCR (hospital  order, performed in [facility] hospital lab) Nasopharyngeal Nasopharyngeal Swab     Status: None   Collection Time: 01/28/20  9:04 AM   Specimen: Nasopharyngeal Swab  Result Value Ref Range Status   SARS Coronavirus 2 NEGATIVE NEGATIVE Final    Comment: (NOTE) SARS-CoV-2 target nucleic acids are NOT DETECTED.  The SARS-CoV-2 RNA is generally detectable in upper and lower respiratory specimens during the acute phase of infection. The lowest concentration of SARS-CoV-2 viral copies this assay can detect is 250 copies / mL. A negative result does not preclude SARS-CoV-2 infection and should not be used as the sole basis for treatment or other patient management decisions.  A negative result may occur with improper specimen collection / handling, submission of specimen other than nasopharyngeal swab, presence of viral mutation(s) within the areas targeted by this assay, and inadequate number of viral copies (<250 copies / mL). A negative result must be combined with  clinical observations, patient history, and epidemiological information.  Fact Sheet for Patients:   StrictlyIdeas.no  Fact Sheet for Healthcare Providers: BankingDealers.co.za  This test is not yet approved or  cleared by the [place] FDA and has been authorized for detection and/or diagnosis of SARS-CoV-2 by FDA under an Emergency Use Authorization (EUA).  This EUA will remain in effect (meaning this test can be used) for the duration of the COVID-19 declaration under Section 564(b)(1) of the Act, 21 U.S.C. section 360bbb-3(b)(1), unless the authorization is terminated or revoked sooner.  Performed at [facility], [address] [place], [place] [address]   MRSA PCR Screening     Status: None   Collection Time: 01/29/20 10:10 AM   Specimen: Nasopharyngeal  Result Value Ref Range Status   MRSA by PCR NEGATIVE NEGATIVE Final    Comment:        The GeneXpert MRSA Assay (FDA approved for NASAL specimens only), is one component of a comprehensive MRSA colonization surveillance program. It is not intended to diagnose MRSA infection nor to guide or monitor treatment for MRSA infections. Performed at [facility], [address] [place], [place] [address]   CULTURE, BLOOD (ROUTINE X 2) w Reflex to ID Panel     Status: None (Preliminary result)   Collection Time: 01/29/20  2:14 PM   Specimen: BLOOD  Result Value Ref Range Status   Specimen Description BLOOD BLOOD LEFT HAND  Final   Special Requests   Final    BOTTLES DRAWN AEROBIC AND ANAEROBIC Blood Culture adequate volume   Culture   Final    NO GROWTH 3 DAYS Performed at [facility], [address] [place], [place] [address]    Report Status PENDING  Incomplete  CULTURE, BLOOD (ROUTINE X 2) w Reflex to ID Panel     Status: None (Preliminary result)   Collection Time: 01/29/20  3:07 PM   Specimen: BLOOD  Result Value Ref Range Status    Specimen Description BLOOD BLOOD LEFT HAND  Final   Special Requests   Final    BOTTLES DRAWN AEROBIC AND ANAEROBIC Blood Culture results may not be optimal due to an excessive volume of blood received in culture bottles   Culture   Final    NO GROWTH 3 DAYS Performed at [facility], [address] [place], [place] [address]    Report Status PENDING  Incomplete  Urine Culture     Status: Abnormal   Collection Time: 01/30/20  1:56 PM   Specimen: Urine, Clean  Catch  Result Value Ref Range Status   Specimen Description   Final    URINE, CLEAN CATCH Performed at [facility], [address] [place], [place] [address]    Special Requests   Final    NONE Performed at [facility], [address] [place], [place] [address]    Culture >=100,000 COLONIES/mL ENTEROCOCCUS FAECIUM (A)  Final   Report Status 02/01/2020 FINAL  Final   Organism ID, Bacteria ENTEROCOCCUS FAECIUM (A)  Final      Susceptibility   Enterococcus faecium - MIC*    AMPICILLIN >=32 RESISTANT Resistant     NITROFURANTOIN 256 RESISTANT Resistant     VANCOMYCIN <=0.5 SENSITIVE Sensitive     * >=100,000 COLONIES/mL ENTEROCOCCUS FAECIUM    Coagulation Studies: No results for input(s): LABPROT, INR in the last 72 hours.  Urinalysis: No results for input(s): COLORURINE, LABSPEC, PHURINE, GLUCOSEU, HGBUR, BILIRUBINUR, KETONESUR, PROTEINUR, UROBILINOGEN, NITRITE, LEUKOCYTESUR in the last 72 hours.  Invalid input(s): APPERANCEUR    Imaging: ECHOCARDIOGRAM COMPLETE  Result Date: 02/01/2020    ECHOCARDIOGRAM REPORT   Patient Name:   Bryan Brewer Date of Exam: 02/01/2020 Medical Rec #:  [id number]      Height:       67.0 in Accession #:    [id number]     Weight:       163.6 lb Date of Birth:  08-Jan-1941      BSA:          1.857 m Patient Age:    31 years       BP:           92/50 mmHg Patient Gender: M              HR:           125 bpm. Exam Location:  [facility] Procedure: 2D Echo, Cardiac  Doppler and Color Doppler Indications:     Atrial Flutter 427.32  History:         Patient has prior history of Echocardiogram examinations, most                  recent 03/31/2019. Prior CABG, COPD; Risk Factors:Diabetes and                  Hypertension.  Sonographer:     [name] RDCS (AE) Referring Phys:  [place] Diagnosing Phys: Kathlyn Sacramento MD IMPRESSIONS  1. Left ventricular ejection fraction, by estimation, is 35 to 40%. The left ventricle has moderately decreased function. The left ventricle demonstrates global hypokinesis. Left ventricular diastolic parameters are indeterminate.  2. Right ventricular systolic function is normal. The right ventricular size is normal. There is normal pulmonary artery systolic pressure.  3. Left atrial size was moderately dilated.  4. Right atrial size was mildly dilated.  5. The mitral valve is normal in structure. Moderate mitral valve regurgitation. No evidence of mitral stenosis.  6. The aortic valve is normal in structure. Aortic valve regurgitation is not visualized. Mild aortic valve sclerosis is present, with no evidence of aortic valve stenosis.  7. The inferior vena cava is normal in size with greater than 50% respiratory variability, suggesting right atrial pressure of 3 mmHg. FINDINGS  Left Ventricle: Left ventricular ejection fraction, by estimation, is 35 to 40%. The left ventricle has moderately decreased function. The left ventricle demonstrates global hypokinesis. The left ventricular internal cavity size was normal in size. There is no left ventricular hypertrophy. Left  ventricular diastolic parameters are indeterminate. Right Ventricle: The right ventricular size is normal. No increase in right ventricular wall thickness. Right ventricular systolic function is normal. There is normal pulmonary artery systolic pressure. The tricuspid regurgitant velocity is 2.52 m/s, and  with an assumed right atrial pressure of 5 mmHg, the estimated right  ventricular systolic pressure is 84.6 mmHg. Left Atrium: Left atrial size was moderately dilated. Right Atrium: Right atrial size was mildly dilated. Pericardium: There is no evidence of pericardial effusion. Mitral Valve: The mitral valve is normal in structure. Normal mobility of the mitral valve leaflets. Moderate mitral valve regurgitation. No evidence of mitral valve stenosis. Tricuspid Valve: The tricuspid valve is normal in structure. Tricuspid valve regurgitation is mild . No evidence of tricuspid stenosis. Aortic Valve: The aortic valve is normal in structure. Aortic valve regurgitation is not visualized. Mild aortic valve sclerosis is present, with no evidence of aortic valve stenosis. Aortic valve mean gradient measures 2.0 mmHg. Aortic valve peak gradient measures 3.6 mmHg. Aortic valve area, by VTI measures 3.31 cm. Pulmonic Valve: The pulmonic valve was normal in structure. Pulmonic valve regurgitation is not visualized. No evidence of pulmonic stenosis. Aorta: The aortic root is normal in size and structure. Venous: The inferior vena cava is normal in size with greater than 50% respiratory variability, suggesting right atrial pressure of 3 mmHg. IAS/Shunts: No atrial level shunt detected by color flow Doppler.  LEFT VENTRICLE PLAX 2D LVIDd:         4.74 cm  Diastology LVIDs:         3.96 cm  LV e' lateral:   4.57 cm/s LV PW:         1.02 cm  LV E/e' lateral: 19.8 LV IVS:        1.04 cm  LV e' medial:    5.00 cm/s LVOT diam:     2.00 cm  LV E/e' medial:  18.1 LV SV:         45 LV SV Index:   24 LVOT Area:     3.14 cm  RIGHT VENTRICLE RV Basal diam:  4.16 cm RV S prime:     9.14 cm/s TAPSE (M-mode): 3.4 cm LEFT ATRIUM              Index       RIGHT ATRIUM           Index LA diam:        5.10 cm  2.75 cm/m  RA Area:     20.60 cm LA Vol (A2C):   112.0 ml 60.32 ml/m RA Volume:   55.20 ml  29.73 ml/m LA Vol (A4C):   118.0 ml 63.55 ml/m LA Biplane Vol: 115.0 ml 61.94 ml/m  AORTIC VALVE                    PULMONIC VALVE AV Area (Vmax):    2.31 cm    PV Vmax:        0.73 m/s AV Area (Vmean):   2.25 cm    PV Peak grad:   2.1 mmHg AV Area (VTI):     3.31 cm    RVOT Peak grad: 4 mmHg AV Vmax:           94.70 cm/s AV Vmean:          68.550 cm/s AV VTI:            0.136 m AV Peak Grad:  3.6 mmHg AV Mean Grad:      2.0 mmHg LVOT Vmax:         69.50 cm/s LVOT Vmean:        49.100 cm/s LVOT VTI:          0.144 m LVOT/AV VTI ratio: 1.05  AORTA Ao Root diam: 3.40 cm MITRAL VALVE               TRICUSPID VALVE MV Area (PHT): 5.54 cm    TR Peak grad:   25.4 mmHg MV Decel Time: 137 msec    TR Vmax:        252.00 cm/s MV E velocity: 90.70 cm/s MV A velocity: 46.10 cm/s  SHUNTS MV E/A ratio:  1.97        Systemic VTI:  0.14 m                            Systemic Diam: 2.00 cm Kathlyn Sacramento MD Electronically signed by Kathlyn Sacramento MD Signature Date/Time: 02/01/2020/10:09:19 AM    Final      Medications:    sodium chloride Stopped (01/31/20 2015)   amiodarone 30 mg/hr (02/01/20 0700)   dextrose 5 % and 0.9% NaCl 50 mL/hr at 02/01/20 0700   heparin 1,100 Units/hr (02/01/20 0700)   vancomycin Stopped (01/31/20 1900)    aspirin EC  81 mg Oral QPM   atorvastatin  80 mg Oral QHS   Chlorhexidine Gluconate Cloth  6 each Topical Daily   ferrous sulfate  325 mg Oral BID   metoprolol tartrate  25 mg Oral BID   midodrine  10 mg Oral TID WC   pantoprazole  40 mg Oral Daily   sodium bicarbonate  1,300 mg Oral TID   sodium chloride flush  3 mL Intravenous Q12H   sodium chloride, acetaminophen, ipratropium-albuterol, ondansetron **OR** ondansetron (ZOFRAN) IV, sodium chloride flush, traMADol  Assessment/ Plan:  [name] is a 79 y.o. white male with diabetes mellitus type II, coronary artery disease status post CABG, COPD, history of bladder cancer with ileal conduit, left hip fracture status post ORIF earlier this month who is admitted to [facility] on 01/28/2020 for Hyponatremia [E87.1] Weakness  [R53.1] Localized swelling of left lower leg [R22.42]  1. Acute renal failure on chronic kidney disease stage IV: baseline creatinine 3.19, GFR of 18 on 01/18/20.  With anion gap metabolic acidosis on nonanion gap acidosis.  No indication for dialysis today.  - on PO sodium bicarbonate. Start IVF.  - holding furosemide.   2. Hyponatremia: secondary to congestive heart failure.  - discontinue dextrose  3. Hypotension: with atrial flutter with RVR and systolic congestive heart failure Appreciate cardiology input. Scheduled for TEE cardioversion for tomorrow.  - midodrine    LOS: 4 Bryan Brewer 6/28/202111:21 AM

## 2020-02-01 NOTE — Progress Notes (Signed)
[place] visited pt. while rounding on ICU and consulting pt.'s RN and palliative care NP at ICU desk.  Pt. in bed lying down, awake and conversational; pt.'s wife at bedside; additional family member (Tim) also in rm.  Pt. says he came to [facility] w/kidney problems and a rapid heart rate; he has been told that he has issues with his heart that are more serious than what he expected and he is processing the news given by the medical team re: his condition.  Pt. does not know if he will make it home.  Pt. and wife are [place]; Wife said several times, 'this is all in the Lord's hands... It's what he wants.' In further conversation with family [place] gathered family is hoping for miraculous healing.  CH offered prayer for pt. and family for divine intervention in pt.'s medical situation; discussed Darrick Meigs belief in God's power to heal as well as Jesus' making a way through death to eternal life.  Family and pt. grateful for [facility] support.  CH remains available to support family, pt. as needed.    02/01/20 1530  Clinical Encounter Type  Visited With Patient and family together  Visit Type Initial;Psychological support;Spiritual support;Social support;Critical Care  Referral From Nurse;Palliative care team  Consult/Referral To Chaplain  Spiritual Encounters  Spiritual Needs Prayer;Emotional  Stress Factors  Patient Stress Factors Health changes;Major life changes  Family Stress Factors Major life changes;Health changes

## 2020-02-01 NOTE — Progress Notes (Signed)
Progress Note  Patient Name: Bryan Brewer Date of Encounter: 02/01/2020  Primary Cardiologist: End  Subjective   He remains in atrial flutter with RVR with ventricular rates in the 120s bpm with BP in the 76P systolic. No chest pain, palpitations, or dyspnea. Planning for pRBC this morning with HGB 7.0. Tolerating amiodarone gtt and heparin.   Inpatient Medications    Scheduled Meds: . aspirin EC  81 mg Oral QPM  . atorvastatin  80 mg Oral QHS  . Chlorhexidine Gluconate Cloth  6 each Topical Daily  . ferrous sulfate  325 mg Oral BID  . metoprolol tartrate  25 mg Oral BID  . midodrine  10 mg Oral TID WC  . pantoprazole  40 mg Oral Daily  . sodium bicarbonate  1,300 mg Oral TID  . sodium chloride flush  3 mL Intravenous Q12H   Continuous Infusions: . sodium chloride Stopped (01/31/20 2015)  . amiodarone 30 mg/hr (02/01/20 0700)  . dextrose 5 % and 0.9% NaCl 50 mL/hr at 02/01/20 0700  . heparin 1,100 Units/hr (02/01/20 0700)  . vancomycin Stopped (01/31/20 1900)   PRN Meds: sodium chloride, acetaminophen, ipratropium-albuterol, ondansetron **OR** ondansetron (ZOFRAN) IV, sodium chloride flush, traMADol   Vital Signs    Vitals:   02/01/20 0200 02/01/20 0300 02/01/20 0400 02/01/20 0500  BP: (!) 99/54 (!) 97/54 (!) 90/48 (!) 92/50  Pulse: (!) 122 (!) 121 (!) 122 (!) 124  Resp: 11 12 12 11   Temp:      TempSrc:      SpO2: 100% 100% 100% 100%  Weight:    74.2 kg  Height:        Intake/Output Summary (Last 24 hours) at 02/01/2020 0850 Last data filed at 02/01/2020 0700 Gross per 24 hour  Intake 2236.95 ml  Output 1250 ml  Net 986.95 ml   Filed Weights   01/29/20 1000 01/31/20 0500 02/01/20 0500  Weight: 71.7 kg 73.7 kg 74.2 kg    Telemetry    Atrial flutter with RVR, ventricular rates in the 120s bpm, rare isolated PVCs - Personally Reviewed  ECG    No new tracings - Personally Reviewed  Physical Exam   GEN: Frail appearing; No acute distress.   Neck:  No JVD. Cardiac: Tachycardic, no murmurs, rubs, or gallops.  Respiratory: Clear to auscultation bilaterally.  GI: Soft, nontender, non-distended.   MS: No edema; No deformity. Neuro:  Alert and oriented x 3; Nonfocal.  Psych: Normal affect.  Labs    Chemistry Recent Labs  Lab 01/28/20 0425 01/29/20 0255 01/30/20 0449 01/31/20 0421 02/01/20 0310  NA 123*   < > 130* 131* 133*  K 4.7   < > 4.8 4.9 5.0  CL 92*   < > 101 103 105  CO2 17*   < > 17* 17* 16*  GLUCOSE 52*   < > 156* 151* 138*  BUN 101*   < > 95* 94* 88*  CREATININE 4.06*   < > 3.64* 3.48* 3.58*  CALCIUM 7.5*   < > 7.5* 7.8* 7.9*  PROT 6.7  --   --   --   --   ALBUMIN 2.1*  --   --   --   --   AST 25  --   --   --   --   ALT 8  --   --   --   --   ALKPHOS 138*  --   --   --   --  BILITOT 1.2  --   --   --   --   GFRNONAA 13*   < > 15* 16* 15*  GFRAA 15*   < > 17* 18* 18*  ANIONGAP 14   < > 12 11 12    < > = values in this interval not displayed.     Hematology Recent Labs  Lab 01/30/20 0730 01/31/20 0421 02/01/20 0310  WBC 13.7* 15.6* 16.3*  RBC 2.79* 2.71* 2.61*  HGB 7.6* 7.2* 7.0*  HCT 23.9* 22.6* 22.2*  MCV 85.7 83.4 85.1  MCH 27.2 26.6 26.8  MCHC 31.8 31.9 31.5  RDW 15.3 15.7* 15.9*  PLT 258 286 271    Cardiac EnzymesNo results for input(s): TROPONINI in the last 168 hours. No results for input(s): TROPIPOC in the last 168 hours.   BNPNo results for input(s): BNP, PROBNP in the last 168 hours.   DDimer No results for input(s): DDIMER in the last 168 hours.   Radiology    No results found.  Cardiac Studies   2D echo 03/2019: 1. The left ventricle has normal systolic function with an ejection  fraction of 60-65%. The cavity size was normal. Left ventricular diastolic  parameters were normal.  2. The right ventricle has normal systolic function. The cavity was  normal. There is no increase in right ventricular wall thickness.  3. The aorta is normal unless otherwise noted.   Patient  Profile     79 y.o. male with history of CAD with NSTEMI in 10/2017 s/p 4-vessel CABG at [facility] (LIMA-LAD, SVG-diag, SVG-OM1, SVG-OM2), chronic combined systolic and diastolic CHF with subsequent normalization of LVSF by echo in 03/2019, ICM, paroxysmal SVT, AAA, DM2, CKD stage IV, anemia of chronic disease, HTN, HLD, COPD, recent mechanical fall s/p intramedullary nail placement on 01/14/2020, C. difficile colitis, GI bleed, and bladder cancer who is being seen today for the evaluation of atrial flutter with RVR.   Assessment & Plan    1. New onset atrial flutter/fib with RVR: -Complicated by hypotension as outlined below -Overall, this is a very difficult situation given his comorbid conditions including acute on CKD stage IV, hypotension, and anemia -Our options have been very limited in managing his arrhythmia  -Plan for 2D surface echo this morning, if this is unrevealing, we will proceed with DCCV without TEE, as his hypotension precludes TEE at this time -With this, he is aware of stroke risk and complications stemming from this, up to and including death and he accepts these risks and wants to proceed -Continue amiodarone and heparin gtts -Not a good candidate for digoxin given acute on CKD -Unable to add beta blockers or nondihydropyridine calcium channel blockers secondary to hypotension -Check echo -TSH normal -Maintain potassium and magnesium goal 4.0 and 2.0 respectively  2. Hypotension: -Likely multifactorial including possibly sepsis with UTI, atrial flutter/fib with RVR with IV amiodarone for rate control, volume depletion, and anemia -IV fluids -Overall, this is somewhat of a difficult situation as we are limited in rate control to IV amiodarone which is likely playing a role in his underlying hypotension -Midodrine, though suspect restoration of sinus rhythm will help the most   3. Acute left lower extremity DVT: -Recent left hip fracture s/p intramedullary nail with  nonadherence to Lovenox DVT PPX  -Heparin gtt, with transition to DVT-dosed [facility] prior to discharge  -Needs VQ scan when stable to evaluate for PE (renal function precludes chest CTA)  4.  CAD status post four-vessel CABG: -No symptoms concerning  for angina -Initial high-sensitivity troponin normal, not cycled -Heparin drip with transition to oral anticoagulation prior to discharge as outlined above, at that time, would discontinue ASA to minimize bleeding risk -No plans for inpatient ischemic evaluation at this time  5.  Acute on CKD stage IV: -Renal function remains above baseline, and relatively unchanged -Likely ATN in the setting of acute infection and hypotension -Monitor -Avoid nephrotoxic agents/medications  6.  Acute on chronic anemia of chronic disease: -Hemoglobin downtrending to 7.0 this morning -Planning for pRBC this morning  7.  Chronic combined systolic and diastolic CHF/ICM: -Patient is subsequent normalization of LVSF as outlined above by echo in 03/2019 -He appears euvolemic -Echo pending -Lasix on hold -As BP allows resume metoprolol  8.  Hyponatremia: -Improving   For questions or updates, please contact [place] Please consult [email or web address] for contact info under Cardiology/STEMI.    Signed, Christell Faith, PA-C [place] Pager: [phone] 02/01/2020, 8:50 AM

## 2020-02-01 NOTE — Progress Notes (Signed)
PROGRESS NOTE    Bryan Brewer  HYQ:[id number] DOB: October 05, 1940 DOA: 01/28/2020 PCP: Tracie Harrier, MD   Brief Narrative:  HPI on 01/28/2020 by Dr. Aretta Nip Agbata Bryan Brewer is a 79 y.o. male with medical history significant forCAD, HFpEF, CKD 4, COPD,bladder cancer status post cystoprostatectomy with urostomy and ileal conduit,CADs/pCABG who presents to the emergency room for evaluation of weakness and nausea.  Patient and her spouse were also concerned that he had some blood in his urine and had been on a blood thinner since he was discharged from the hospital following an intramedullary nail placement in his left hip. He denies having any fever or chills, denies having any chest pain, abdominal pain or changes in his bowel habits. Labs revealed worsening of his renal function from baseline, his serum creatinine on discharge was 2.86 and today on admission it is 4.06, sodium is low at 123 and he has a white count of 14.5 with a hemoglobin of 8.2 that is chronic. Chest x-ray shows no acute findings and he has pyuria that appears to be chronic. Patient has swelling of his left lower extremity and venous Doppler shows Positive for acute nonocclusive DVT within the popliteal vein.  Interim history Patient was admitted with new onset atrial fibrillation.  Now with hypotension and currently in the ICU.  Cardiology consulted and hoping to possibly do cardioversion.  Patient also being treated for sepsis secondary to UTI.  Assessment & Plan   New onset Atrial fibrillation -Rate had noted to be up to 140s yesterday, patient was placed on amiodarone drip as well as heparin drip -Cardiology consulted and appreciated -Atrial fibrillation treatment complicated by hypotension -Echocardiogram ordered -TSH 1.818 (see WNL) -Continue to keep potassium at 4 and magnesium 2 -Currently on heparin -Discussed with cardiology today, planning for 2D surface echocardiogram and possibly proceeding with  DCCV without TEE.  Hypotension -Has moved patient to ICU -Given IV fluid bolus -Placed on midodrine 10 mg 3 times daily -Blood pressures remain soft today  Sepsis secondary to UTI -Present on admission, patient was noted to have leukocytosis with tachycardia -UA on admission: Showed moderate leukocytes, 21-50 WBC, no bacteria seen -Unfortunately urine culture was not done, urine culture: Enterococcus face centimeters -Blood cultures show no growth to date (however were taken after antibiotics were started) -Patient was placed on Merrem for history of ESBL-however this was discontinued due to urine culture results.  Patient now on vancomycin  Acute left lower extremity DVT -Patient with recent left hip fracture status post intramedullary nail and nonadherence to Lovenox DVT prophylaxis -Extremity Doppler positive for acute nonocclusive DVT within the popliteal vein -Do not think patient needs VQ scan given that he will need to be on anticoagulation for DVT and AF -Place patient on heparin drip -Patient now complaining of right lower extremity pain, will also order right lower extremity Doppler to rule out DVT although this would not change treatment plan  Acute kidney injury on chronic kidney disease, stage IV -Baseline creatinine approximately 3.4, however on admission was 4.06 -Placed on IV fluids, creatinine currently down to 3.58 -Continue to monitor BMP  Hyponatremia -Sodium was 123 on admission -Based on IV fluids, sodium up to 133 -Continue to monitor BMP  Chronic combined systolic and diastolic heart failure -Appears to be euvolemic -Echocardiogram on 03/31/2019 shows an EF of 60 to 65% -Continue to monitor intake and output, daily weights  COPD -Appears to be stable, will continue to monitor  Acute on chronic anemia of  chronic disease -Hemoglobin down to 7 -1 unit of PRBC ordered for transfusion -Continue to monitor CBC  Diabetes mellitus, type II with hypoglycemic  events -Last hemoglobin A1c 6.4 on 01/13/2020 -Continue D5   DVT Prophylaxis Heparin  Code Status: Full  Family Communication: None at bedside.   Disposition Plan:  Status is: Inpatient  Remains inpatient appropriate because:Hemodynamically unstable, Ongoing diagnostic testing needed not appropriate for outpatient work up, IV treatments appropriate due to intensity of illness or inability to take PO and Inpatient level of care appropriate due to severity of illness   Dispo: The patient is from: Home              Anticipated d/c is to: TBD              Anticipated d/c date is: > 3 days              Patient currently is not medically stable to d/c.  Consultants Cardiology PCCM  Procedures  Left lower extremity Doppler  Antibiotics   Anti-infectives (From admission, onward)   Start     Dose/Rate Route Frequency Ordered Stop   01/31/20 1700  vancomycin (VANCOCIN) IVPB 1000 mg/200 mL premix     Discontinue     1,000 mg 200 mL/hr over 60 Minutes Intravenous Every 48 hours 01/31/20 1550     01/28/20 1800  meropenem (MERREM) 500 mg in sodium chloride 0.9 % 100 mL IVPB  Status:  Discontinued        500 mg 200 mL/hr over 30 Minutes Intravenous Every 12 hours 01/28/20 1737 01/31/20 1550      Subjective:   Bryan Brewer seen and examined today.  Patient denies chest pain, or shortness of breath this morning.  Denies current abdominal pain, nausea or vomiting, diarrhea or constipation, dizziness or headache.    Objective:   Vitals:   02/01/20 0200 02/01/20 0300 02/01/20 0400 02/01/20 0500  BP: (!) 99/54 (!) 97/54 (!) 90/48 (!) 92/50  Pulse: (!) 122 (!) 121 (!) 122 (!) 124  Resp: 11 12 12 11   Temp:      TempSrc:      SpO2: 100% 100% 100% 100%  Weight:    74.2 kg  Height:        Intake/Output Summary (Last 24 hours) at 02/01/2020 0948 Last data filed at 02/01/2020 0700 Gross per 24 hour  Intake 2236.95 ml  Output 1250 ml  Net 986.95 ml   Filed Weights   01/29/20 1000  01/31/20 0500 02/01/20 0500  Weight: 71.7 kg 73.7 kg 74.2 kg   Exam  General: Well developed, chronically ill-appearing, NAD  HEENT: NCAT, mucous membranes moist.   Cardiovascular: S1 S2 auscultated, irregularly irregular, soft SEM  Respiratory: Diminished breath sounds  Abdomen: Soft, nontender, nondistended, + bowel sounds  Extremities: warm dry without cyanosis clubbing.  LLE edema. Bruising and scabs noted on lower extremities. UE edema B/L   Neuro: AAOx3, nonfocal  Psych: Appropriate mood and affect, pleasant  Data Reviewed: I have personally reviewed following labs and imaging studies  CBC: Recent Labs  Lab 01/28/20 0425 01/29/20 0255 01/30/20 0730 01/31/20 0421 02/01/20 0310  WBC 14.5* 11.0* 13.7* 15.6* 16.3*  NEUTROABS 13.4*  --   --   --   --   HGB 8.2* 7.5* 7.6* 7.2* 7.0*  HCT 26.5* 23.3* 23.9* 22.6* 22.2*  MCV 87.7 83.5 85.7 83.4 85.1  PLT 306 242 258 286 619   Basic Metabolic Panel: Recent Labs  Lab 01/28/20  0425 01/29/20 0255 01/29/20 1414 01/30/20 0449 01/31/20 0421 02/01/20 0310  NA 123* 127*  --  130* 131* 133*  K 4.7 5.1  --  4.8 4.9 5.0  CL 92* 98  --  101 103 105  CO2 17* 17*  --  17* 17* 16*  GLUCOSE 52* 83  --  156* 151* 138*  BUN 101* 101*  --  95* 94* 88*  CREATININE 4.06* 3.89*  --  3.64* 3.48* 3.58*  CALCIUM 7.5* 7.3*  --  7.5* 7.8* 7.9*  MG  --   --  1.8  --   --   --    GFR: Estimated Creatinine Clearance: 15.9 mL/min (A) (by C-G formula based on SCr of 3.58 mg/dL (H)). Liver Function Tests: Recent Labs  Lab 01/28/20 0425  AST 25  ALT 8  ALKPHOS 138*  BILITOT 1.2  PROT 6.7  ALBUMIN 2.1*   No results for input(s): LIPASE, AMYLASE in the last 168 hours. No results for input(s): AMMONIA in the last 168 hours. Coagulation Profile: Recent Labs  Lab 01/28/20 2313  INR 1.5*   Cardiac Enzymes: No results for input(s): CKTOTAL, CKMB, CKMBINDEX, TROPONINI in the last 168 hours. BNP (last 3 results) No results for  input(s): PROBNP in the last 8760 hours. HbA1C: No results for input(s): HGBA1C in the last 72 hours. CBG: Recent Labs  Lab 01/28/20 2201 01/29/20 0244 01/29/20 0558 01/29/20 0759 01/29/20 1004  GLUCAP 85 85 74 118* 189*   Lipid Profile: No results for input(s): CHOL, HDL, LDLCALC, TRIG, CHOLHDL, LDLDIRECT in the last 72 hours. Thyroid Function Tests: Recent Labs    01/29/20 1048  TSH 1.818   Anemia Panel: No results for input(s): VITAMINB12, FOLATE, FERRITIN, TIBC, IRON, RETICCTPCT in the last 72 hours. Urine analysis:    Component Value Date/Time   COLORURINE YELLOW (A) 01/28/2020 0959   APPEARANCEUR CLOUDY (A) 01/28/2020 0959   LABSPEC 1.010 01/28/2020 0959   PHURINE 7.0 01/28/2020 0959   GLUCOSEU NEGATIVE 01/28/2020 0959   HGBUR SMALL (A) 01/28/2020 0959   BILIRUBINUR NEGATIVE 01/28/2020 0959   KETONESUR NEGATIVE 01/28/2020 0959   PROTEINUR 100 (A) 01/28/2020 0959   NITRITE NEGATIVE 01/28/2020 0959   LEUKOCYTESUR MODERATE (A) 01/28/2020 0959   Sepsis Labs: @LABRCNTIP (procalcitonin:4,lacticidven:4)  ) Recent Results (from the past 240 hour(s))  SARS Coronavirus 2 by RT PCR (hospital order, performed in [place] hospital lab) Nasopharyngeal Nasopharyngeal Swab     Status: None   Collection Time: 01/28/20  9:04 AM   Specimen: Nasopharyngeal Swab  Result Value Ref Range Status   SARS Coronavirus 2 NEGATIVE NEGATIVE Final    Comment: (NOTE) SARS-CoV-2 target nucleic acids are NOT DETECTED.  The SARS-CoV-2 RNA is generally detectable in upper and lower respiratory specimens during the acute phase of infection. The lowest concentration of SARS-CoV-2 viral copies this assay can detect is 250 copies / mL. A negative result does not preclude SARS-CoV-2 infection and should not be used as the sole basis for treatment or other patient management decisions.  A negative result may occur with improper specimen collection / handling, submission of specimen other than  nasopharyngeal swab, presence of viral mutation(s) within the areas targeted by this assay, and inadequate number of viral copies (<250 copies / mL). A negative result must be combined with clinical observations, patient history, and epidemiological information.  Fact Sheet for Patients:   StrictlyIdeas.no  Fact Sheet for Healthcare Providers: BankingDealers.co.za  This test is not yet approved or  cleared  by the [place] and has been authorized for detection and/or diagnosis of SARS-CoV-2 by FDA under an Emergency Use Authorization (EUA).  This EUA will remain in effect (meaning this test can be used) for the duration of the COVID-19 declaration under Section 564(b)(1) of the Act, 21 U.S.C. section 360bbb-3(b)(1), unless the authorization is terminated or revoked sooner.  Performed at [facility], [address] [place], [place] [address]   MRSA PCR Screening     Status: None   Collection Time: 01/29/20 10:10 AM   Specimen: Nasopharyngeal  Result Value Ref Range Status   MRSA by PCR NEGATIVE NEGATIVE Final    Comment:        The GeneXpert MRSA Assay (FDA approved for NASAL specimens only), is one component of a comprehensive MRSA colonization surveillance program. It is not intended to diagnose MRSA infection nor to guide or monitor treatment for MRSA infections. Performed at [facility], [address] [place], [place] [address]   CULTURE, BLOOD (ROUTINE X 2) w Reflex to ID Panel     Status: None (Preliminary result)   Collection Time: 01/29/20  2:14 PM   Specimen: BLOOD  Result Value Ref Range Status   Specimen Description BLOOD BLOOD LEFT HAND  Final   Special Requests   Final    BOTTLES DRAWN AEROBIC AND ANAEROBIC Blood Culture adequate volume   Culture   Final    NO GROWTH 3 DAYS Performed at [facility], [address] [place], [place] [address]    Report Status PENDING   Incomplete  CULTURE, BLOOD (ROUTINE X 2) w Reflex to ID Panel     Status: None (Preliminary result)   Collection Time: 01/29/20  3:07 PM   Specimen: BLOOD  Result Value Ref Range Status   Specimen Description BLOOD BLOOD LEFT HAND  Final   Special Requests   Final    BOTTLES DRAWN AEROBIC AND ANAEROBIC Blood Culture results may not be optimal due to an excessive volume of blood received in culture bottles   Culture   Final    NO GROWTH 3 DAYS Performed at [facility], [address], [place], [place] [address]    Report Status PENDING  Incomplete  Urine Culture     Status: Abnormal   Collection Time: 01/30/20  1:56 PM   Specimen: Urine, Clean Catch  Result Value Ref Range Status   Specimen Description   Final    URINE, CLEAN CATCH Performed at [facility], [address], [place], [place] [address]    Special Requests   Final    NONE Performed at [facility], [address] [place], [place] [address]    Culture >=100,000 COLONIES/mL ENTEROCOCCUS FAECIUM (A)  Final   Report Status 02/01/2020 FINAL  Final   Organism ID, Bacteria ENTEROCOCCUS FAECIUM (A)  Final      Susceptibility   Enterococcus faecium - MIC*    AMPICILLIN >=32 RESISTANT Resistant     NITROFURANTOIN 256 RESISTANT Resistant     VANCOMYCIN <=0.5 SENSITIVE Sensitive     * >=100,000 COLONIES/mL ENTEROCOCCUS FAECIUM      Radiology Studies: No results found.   Scheduled Meds: . aspirin EC  81 mg Oral QPM  . atorvastatin  80 mg Oral QHS  . Chlorhexidine Gluconate Cloth  6 each Topical Daily  . ferrous sulfate  325 mg Oral BID  . metoprolol tartrate  25 mg Oral BID  . midodrine  10 mg Oral  TID WC  . pantoprazole  40 mg Oral Daily  . sodium bicarbonate  1,300 mg Oral TID  . sodium chloride flush  3 mL Intravenous Q12H   Continuous Infusions: . sodium chloride Stopped (01/31/20 2015)  . amiodarone 30 mg/hr (02/01/20 0700)  . dextrose 5 % and 0.9% NaCl 50 mL/hr at 02/01/20  0700  . heparin 1,100 Units/hr (02/01/20 0700)  . vancomycin Stopped (01/31/20 1900)     LOS: 4 days   Time Spent in minutes   45 minutes  Norina Cowper D.O. on 02/01/2020 at 9:48 AM  Between 7am to 7pm - Please see pager noted on [email or web address]  After 7pm go to [email or web address]  And look for the night coverage person covering for me after hours  [facility] Office  [phone]

## 2020-02-01 NOTE — Progress Notes (Signed)
[place] for heparin Indication: DVT  No Known Allergies  Patient Measurements: Height: 5\' 7"  (170.2 cm) Weight: 73.7 kg (162 lb 7.7 oz) IBW/kg (Calculated) : 66.1 Heparin Dosing Weight: 67 kg  Vital Signs: Temp: 97.6 F (36.4 C) (06/28 0055) Temp Source: Oral (06/28 0055) BP: 90/48 (06/28 0400) Pulse Rate: 122 (06/28 0400)  Labs: Recent Labs    01/30/20 0449 01/30/20 0730 01/30/20 0730 01/31/20 0421 02/01/20 0310  HGB  --  7.6*   < > 7.2* 7.0*  HCT  --  23.9*  --  22.6* 22.2*  PLT  --  258  --  286 271  HEPARINUNFRC 0.45  --   --  0.36 0.35  CREATININE 3.64*  --   --  3.48* 3.58*   < > = values in this interval not displayed.    Estimated Creatinine Clearance: 15.9 mL/min (A) (by C-G formula based on SCr of 3.58 mg/dL (H)).   Medical History: Past Medical History:  Diagnosis Date  . (HFpEF) heart failure with preserved ejection fraction ([place])    a. 03/2019 Echo: EF 60-65%, nl RV fxn.  Marland Kitchen AAA (abdominal aortic aneurysm) ([place]) 09/2019   3.0 infrarenal AAA incidentally noted on abdominal/pelvic CT  . Bladder cancer ([place])   . C. difficile colitis 04/2019  . CHF (congestive heart failure) ([place])   . CKD (chronic kidney disease), stage IV ([place])   . COPD (chronic obstructive pulmonary disease) ([place])   . Coronary artery disease    a. 10/2017 NSTEMI/Cath: LM 45m, 75d, LAD 75ost, 90p, D1 100, LCX 85m/d, OM1 90, RCA 25p; b. 10/2017 CABG x 4 ([facility]): LIMA->LAD, VG->Diag, VG->OM1, VG->OM2.  . Hyperlipidemia   . Hypertension   . NSVT (nonsustained ventricular tachycardia) ([place])    a. 06/2019 [facility]: 2 runs of NSVT up to 12 beats, max rate 187 bpm.  . PSVT (paroxysmal supraventricular tachycardia) ([place])    06/2019 [facility]: 1820 episodes of SVT lasting up to 00:01:16 w/ max rate of 210.  . Type 2 diabetes mellitus [facility])     Assessment: 79 year old male with ultrasound left lower extremity positive for acute nonocclusive DVT within the popliteal  vein. Pharmacy consulted for heparin drip.  6/25 0255 HL 0.32, therapeutic.   6/25 1048 HL 0.42.  6/26 0449 HL 0.45, therapeutic 6/27 0421 HL 0.36, therapeutic.  CBC seems relatively stable but H/H low. 6/28 0310 HL 0.35, therapeutic.  CBC stable.  Goal of Therapy:  Heparin level 0.3-0.7 units/ml Monitor platelets by anticoagulation protocol: Yes   Plan:  Heparin level is therapeutic. Will continue Heparin drip at 1100 units/hr. Order anti-xa and CBC with AM labs.   Ena Dawley, PharmD 02/01/2020,4:45 AM

## 2020-02-02 ENCOUNTER — Encounter: Payer: Self-pay | Admitting: Certified Registered Nurse Anesthetist

## 2020-02-02 ENCOUNTER — Encounter: Payer: Self-pay | Admitting: Internal Medicine

## 2020-02-02 ENCOUNTER — Encounter
Admission: EM | Disposition: A | Discharge status: Hospice | Payer: Self-pay | Source: Home / Self Care | Attending: Internal Medicine

## 2020-02-02 ENCOUNTER — Inpatient Hospital Stay: Payer: Medicare HMO | Admitting: Anesthesiology

## 2020-02-02 ENCOUNTER — Inpatient Hospital Stay: Payer: Medicare HMO

## 2020-02-02 DIAGNOSIS — I82402 Acute embolism and thrombosis of unspecified deep veins of left lower extremity: Secondary | ICD-10-CM

## 2020-02-02 DIAGNOSIS — M726 Necrotizing fasciitis: Secondary | ICD-10-CM

## 2020-02-02 DIAGNOSIS — M6080 Other myositis, unspecified site: Secondary | ICD-10-CM

## 2020-02-02 DIAGNOSIS — Z515 Encounter for palliative care: Secondary | ICD-10-CM

## 2020-02-02 HISTORY — PX: TEE WITHOUT CARDIOVERSION: SHX5443

## 2020-02-02 HISTORY — PX: CARDIOVERSION: SHX1299

## 2020-02-02 LAB — BASIC METABOLIC PANEL
Anion gap: 11 (ref 5–15)
BUN: 88 mg/dL — ABNORMAL HIGH (ref 8–23)
CO2: 19 mmol/L — ABNORMAL LOW (ref 22–32)
Calcium: 8.1 mg/dL — ABNORMAL LOW (ref 8.9–10.3)
Chloride: 103 mmol/L (ref 98–111)
Creatinine, Ser: 3.77 mg/dL — ABNORMAL HIGH (ref 0.61–1.24)
GFR calc Af Amer: 17 mL/min — ABNORMAL LOW (ref >60)
GFR calc non Af Amer: 14 mL/min — ABNORMAL LOW (ref >60)
Glucose, Bld: 163 mg/dL — ABNORMAL HIGH (ref 70–99)
Potassium: 5 mmol/L (ref 3.5–5.1)
Sodium: 133 mmol/L — ABNORMAL LOW (ref 135–145)

## 2020-02-02 LAB — CBC
HCT: 19.8 % — ABNORMAL LOW (ref 39.0–52.0)
Hemoglobin: 6.6 g/dL — ABNORMAL LOW (ref 13.0–17.0)
MCH: 27 pg (ref 26.0–34.0)
MCHC: 33.3 g/dL (ref 30.0–36.0)
MCV: 81.1 fL (ref 80.0–100.0)
Platelets: 273 10*3/uL (ref 150–400)
RBC: 2.44 MIL/uL — ABNORMAL LOW (ref 4.22–5.81)
RDW: 16.3 % — ABNORMAL HIGH (ref 11.5–15.5)
WBC: 23.1 10*3/uL — ABNORMAL HIGH (ref 4.0–10.5)
nRBC: 0 % (ref 0.0–0.2)

## 2020-02-02 LAB — PREPARE RBC (CROSSMATCH)

## 2020-02-02 LAB — HEPARIN LEVEL (UNFRACTIONATED): Heparin Unfractionated: 0.43 IU/mL (ref 0.30–0.70)

## 2020-02-02 SURGERY — CARDIOVERSION
Anesthesia: General

## 2020-02-02 SURGERY — ECHOCARDIOGRAM, TRANSESOPHAGEAL
Anesthesia: General

## 2020-02-02 MED ORDER — LORAZEPAM 1 MG PO TABS: 1.0000 mg | ORAL_TABLET | ORAL | Status: DC | PRN

## 2020-02-02 MED ORDER — GLYCOPYRROLATE 1 MG PO TABS
1.0000 mg | ORAL_TABLET | ORAL | Status: DC | PRN
  Filled 2020-02-02: qty 1

## 2020-02-02 MED ORDER — BIOTENE DRY MOUTH MT LIQD: 15.0000 mL | OROMUCOSAL | Status: DC | PRN

## 2020-02-02 MED ORDER — POLYVINYL ALCOHOL 1.4 % OP SOLN
1.0000 [drp] | Freq: Four times a day (QID) | OPHTHALMIC | Status: DC | PRN
  Filled 2020-02-02: qty 15

## 2020-02-02 MED ORDER — HYDROMORPHONE HCL 1 MG/ML IJ SOLN
0.5000 mg | INTRAMUSCULAR | Status: DC | PRN
  Administered 2020-02-02 – 2020-02-05 (×8): 0.5 mg via INTRAVENOUS
  Filled 2020-02-02 (×8): qty 1

## 2020-02-02 MED ORDER — LORAZEPAM 2 MG/ML PO CONC
1.0000 mg | ORAL | Status: DC | PRN
  Filled 2020-02-02: qty 0.5

## 2020-02-02 MED ORDER — HALOPERIDOL LACTATE 5 MG/ML IJ SOLN: 0.5000 mg | INTRAMUSCULAR | Status: DC | PRN

## 2020-02-02 MED ORDER — PROPOFOL 500 MG/50ML IV EMUL
INTRAVENOUS | Status: AC
  Filled 2020-02-02: qty 50

## 2020-02-02 MED ORDER — SODIUM CHLORIDE 0.9% IV SOLUTION: Freq: Once | INTRAVENOUS | Status: DC

## 2020-02-02 MED ORDER — SODIUM CHLORIDE 0.9% IV SOLUTION: Freq: Once | INTRAVENOUS | Status: AC

## 2020-02-02 MED ORDER — LORAZEPAM 2 MG/ML IJ SOLN
1.0000 mg | INTRAMUSCULAR | Status: DC | PRN
  Administered 2020-02-05: 1 mg via INTRAVENOUS
  Filled 2020-02-02: qty 1

## 2020-02-02 MED ORDER — ONDANSETRON 4 MG PO TBDP
4.0000 mg | ORAL_TABLET | Freq: Four times a day (QID) | ORAL | Status: DC | PRN
  Filled 2020-02-02: qty 1

## 2020-02-02 MED ORDER — ONDANSETRON HCL 4 MG/2ML IJ SOLN: 4.0000 mg | Freq: Four times a day (QID) | INTRAMUSCULAR | Status: DC | PRN

## 2020-02-02 MED ORDER — HALOPERIDOL 0.5 MG PO TABS
0.5000 mg | ORAL_TABLET | ORAL | Status: DC | PRN
  Filled 2020-02-02: qty 1

## 2020-02-02 MED ORDER — GLYCOPYRROLATE 0.2 MG/ML IJ SOLN: 0.2000 mg | INTRAMUSCULAR | Status: DC | PRN

## 2020-02-02 MED ORDER — HALOPERIDOL LACTATE 2 MG/ML PO CONC
0.5000 mg | ORAL | Status: DC | PRN
  Filled 2020-02-02: qty 0.3

## 2020-02-02 NOTE — Progress Notes (Signed)
[facility]  ROUNDING NOTE   Subjective:   Cardioversion for today.  UOP 1 liter Creatomome 3.77 (3.58)  Objective:  Vital signs in last 24 hours:  Temp:  [98 F (36.7 C)-98.2 F (36.8 C)] 98.2 F (36.8 C) (06/29 0930) Pulse Rate:  [111-127] 127 (06/29 0915) Resp:  [10-16] 15 (06/29 0915) BP: (91-112)/(52-67) 109/62 (06/29 0930) SpO2:  [93 %-100 %] 93 % (06/29 0930) Weight:  [76.5 kg] 76.5 kg (06/29 0500)  Weight change: 2.3 kg Filed Weights   01/31/20 0500 02/01/20 0500 02/02/20 0500  Weight: 73.7 kg 74.2 kg 76.5 kg    Intake/Output: I/O last 3 completed shifts: In: 2705.4 [P.O.:240; I.V.:2465.4] Out: 1500 [Urine:1500]   Intake/Output this shift:  Total I/O In: 375 [Blood:375] Out: -   Physical Exam: General: NAD,   Head: Normocephalic, atraumatic. Moist oral mucosal membranes  Eyes: Anicteric, PERRL  Neck: Supple, trachea midline  Lungs:  Clear to auscultation  Heart: Regular rate and rhythm  Abdomen:  Soft, nontender,   Extremities: trace peripheral edema.  Neurologic: Nonfocal, moving all four extremities  Skin: No lesions   GU Ileostomy with foley bag. Urine is purple in color    Basic Metabolic Panel: Recent Labs  Lab 01/29/20 0255 01/29/20 0255 01/29/20 1414 01/30/20 0449 01/30/20 0449 01/31/20 0421 02/01/20 0310 02/02/20 0444  NA 127*  --   --  130*  --  131* 133* 133*  K 5.1  --   --  4.8  --  4.9 5.0 5.0  CL 98  --   --  101  --  103 105 103  CO2 17*  --   --  17*  --  17* 16* 19*  GLUCOSE 83  --   --  156*  --  151* 138* 163*  BUN 101*  --   --  95*  --  94* 88* 88*  CREATININE 3.89*  --   --  3.64*  --  3.48* 3.58* 3.77*  CALCIUM 7.3*   < >  --  7.5*   < > 7.8* 7.9* 8.1*  MG  --   --  1.8  --   --   --   --   --    < > = values in this interval not displayed.    Liver Function Tests: Recent Labs  Lab 01/28/20 0425  AST 25  ALT 8  ALKPHOS 138*  BILITOT 1.2  PROT 6.7  ALBUMIN 2.1*   No results for input(s):  LIPASE, AMYLASE in the last 168 hours. No results for input(s): AMMONIA in the last 168 hours.  CBC: Recent Labs  Lab 01/28/20 0425 01/28/20 0425 01/29/20 0255 01/30/20 0730 01/31/20 0421 02/01/20 0310 02/02/20 0444  WBC 14.5*   < > 11.0* 13.7* 15.6* 16.3* 23.1*  NEUTROABS 13.4*  --   --   --   --   --   --   HGB 8.2*   < > 7.5* 7.6* 7.2* 7.0* 6.6*  HCT 26.5*   < > 23.3* 23.9* 22.6* 22.2* 19.8*  MCV 87.7   < > 83.5 85.7 83.4 85.1 81.1  PLT 306   < > 242 258 286 271 273   < > = values in this interval not displayed.    Cardiac Enzymes: No results for input(s): CKTOTAL, CKMB, CKMBINDEX, TROPONINI in the last 168 hours.  BNP: Invalid input(s): POCBNP  CBG: Recent Labs  Lab 01/28/20 2201 01/29/20 0244 01/29/20 0558 01/29/20 0759 01/29/20 1004  GLUCAP 85 85 74 118* 189*    Microbiology: Results for orders placed or performed during the hospital encounter of 01/28/20  SARS Coronavirus 2 by RT PCR (hospital order, performed in [facility] hospital lab) Nasopharyngeal Nasopharyngeal Swab     Status: None   Collection Time: 01/28/20  9:04 AM   Specimen: Nasopharyngeal Swab  Result Value Ref Range Status   SARS Coronavirus 2 NEGATIVE NEGATIVE Final    Comment: (NOTE) SARS-CoV-2 target nucleic acids are NOT DETECTED.  The SARS-CoV-2 RNA is generally detectable in upper and lower respiratory specimens during the acute phase of infection. The lowest concentration of SARS-CoV-2 viral copies this assay can detect is 250 copies / mL. A negative result does not preclude SARS-CoV-2 infection and should not be used as the sole basis for treatment or other patient management decisions.  A negative result may occur with improper specimen collection / handling, submission of specimen other than nasopharyngeal swab, presence of viral mutation(s) within the areas targeted by this assay, and inadequate number of viral copies (<250 copies / mL). A negative result must be combined with  clinical observations, patient history, and epidemiological information.  Fact Sheet for Patients:   StrictlyIdeas.no  Fact Sheet for Healthcare Providers: BankingDealers.co.za  This test is not yet approved or  cleared by the [place] FDA and has been authorized for detection and/or diagnosis of SARS-CoV-2 by FDA under an Emergency Use Authorization (EUA).  This EUA will remain in effect (meaning this test can be used) for the duration of the COVID-19 declaration under Section 564(b)(1) of the Act, 21 U.S.C. section 360bbb-3(b)(1), unless the authorization is terminated or revoked sooner.  Performed at [facility], [address] [place], [place] [address]   MRSA PCR Screening     Status: None   Collection Time: 01/29/20 10:10 AM   Specimen: Nasopharyngeal  Result Value Ref Range Status   MRSA by PCR NEGATIVE NEGATIVE Final    Comment:        The GeneXpert MRSA Assay (FDA approved for NASAL specimens only), is one component of a comprehensive MRSA colonization surveillance program. It is not intended to diagnose MRSA infection nor to guide or monitor treatment for MRSA infections. Performed at [facility], [address] [place], [place] [address]   CULTURE, BLOOD (ROUTINE X 2) w Reflex to ID Panel     Status: None (Preliminary result)   Collection Time: 01/29/20  2:14 PM   Specimen: BLOOD  Result Value Ref Range Status   Specimen Description BLOOD BLOOD LEFT HAND  Final   Special Requests   Final    BOTTLES DRAWN AEROBIC AND ANAEROBIC Blood Culture adequate volume   Culture   Final    NO GROWTH 4 DAYS Performed at [facility], [address] [place], [place] [address]    Report Status PENDING  Incomplete  CULTURE, BLOOD (ROUTINE X 2) w Reflex to ID Panel     Status: None (Preliminary result)   Collection Time: 01/29/20  3:07 PM   Specimen: BLOOD  Result Value Ref Range Status    Specimen Description BLOOD BLOOD LEFT HAND  Final   Special Requests   Final    BOTTLES DRAWN AEROBIC AND ANAEROBIC Blood Culture results may not be optimal due to an excessive volume of blood received in culture bottles   Culture   Final    NO GROWTH 4 DAYS Performed at [facility], [address], [place],  [address]  Report Status PENDING  Incomplete  Urine Culture     Status: Abnormal   Collection Time: 01/30/20  1:56 PM   Specimen: Urine, Clean Catch  Result Value Ref Range Status   Specimen Description   Final    URINE, CLEAN CATCH Performed at [facility], [address], [place], [place] [address]    Special Requests   Final    NONE Performed at [facility], [address] [place],  [address]    Culture >=100,000 COLONIES/mL ENTEROCOCCUS FAECIUM (A)  Final   Report Status 02/01/2020 FINAL  Final   Organism ID, Bacteria ENTEROCOCCUS FAECIUM (A)  Final      Susceptibility   Enterococcus faecium - MIC*    AMPICILLIN >=32 RESISTANT Resistant     NITROFURANTOIN 256 RESISTANT Resistant     VANCOMYCIN <=0.5 SENSITIVE Sensitive     * >=100,000 COLONIES/mL ENTEROCOCCUS FAECIUM    Coagulation Studies: No results for input(s): LABPROT, INR in the last 72 hours.  Urinalysis: No results for input(s): COLORURINE, LABSPEC, PHURINE, GLUCOSEU, HGBUR, BILIRUBINUR, KETONESUR, PROTEINUR, UROBILINOGEN, NITRITE, LEUKOCYTESUR in the last 72 hours.  Invalid input(s): APPERANCEUR    Imaging: CT FEMUR RIGHT WO CONTRAST  Result Date: 02/02/2020 CLINICAL DATA:  Crepitus, soft tissue gas suspected.  Elevated WBC. EXAM: CT OF THE LOWER RIGHT FEMUR WITHOUT CONTRAST CT OF THE LOWER RIGHT TIBIA/FIBULA WITHOUT CONTRAST TECHNIQUE: Multidetector CT imaging of the right femur was performed according to the standard protocol. Multidetector CT imaging of the right tibia and fibula was performed according to the standard protocol. COMPARISON:  None.  FINDINGS: Bones/Joint/Cartilage Air in the pubic symphysis with bone destruction consistent with symphyseal osteomyelitis with small amount air in the soft tissues along the anterior superficial aspect and air tracking in the pre vesicular space which is suboptimally visualized. No fracture or dislocation. Normal alignment. No joint effusion. Mild joint space narrowing of the right hip. Severe medial femorotibial compartment and lateral femorotibial compartment joint space narrowing consistent with advanced osteoarthritis. Mild patellofemoral compartment joint space narrowing. Mild osteoarthritis of the tibiotalar joint. Ligaments Ligaments are suboptimally evaluated by CT. Soft tissue, Muscles and Tendons Moderate atrophy of the semimembranosus muscle. Fatty atrophy of the rectus femoris muscle. Extensive soft tissue emphysema along the anterior aspect of the thigh extending medially along the lower thigh and knee and posterior medially into the lower leg to the level just above the ankle. Soft tissue emphysema in the rectus femoris muscle. Extensive soft tissue emphysema in the adductor musculature most severe in the adductor magnus. Additionally there is a 2.4 x 4.2 x 6.8 cm hypodense area in the adductor magnus which may reflect an abscess or necrosis. Soft tissue emphysema in the sartorius muscle. Peripheral vascular atherosclerotic disease. Extensive soft tissue edema throughout the right thigh and bilateral lower legs likely reactive. IMPRESSION: 1. Extensive soft tissue emphysema along the anterior aspect of the thigh extending medially along the lower thigh and knee and posterior medially into the lower leg to the level just above the ankle most concerning for necrotizing fasciitis. Soft tissue emphysema in the rectus femoris muscle and adductor musculature as detailed above most concerning for necrotizing myositis. 2.4 x 4.2 x 6.8 cm hypodense area in the adductor magnus which may reflect an abscess or  myonecrosis. Extensive symphyseal osteomyelitis which is partially visualized as the entire areas not included in the field of view. 2.  Tricompartmental osteoarthritis of the right knee. Critical Value/emergent results were called by telephone at the time  of interpretation on 02/02/2020 at 12:17 pm to provider [facility] , who verbally acknowledged these results. Electronically Signed   By: Kathreen Devoid   On: 02/02/2020 12:19   CT TIBIA FIBULA RIGHT WO CONTRAST  Result Date: 02/02/2020 CLINICAL DATA:  Crepitus, soft tissue gas suspected.  Elevated WBC. EXAM: CT OF THE LOWER RIGHT FEMUR WITHOUT CONTRAST CT OF THE LOWER RIGHT TIBIA/FIBULA WITHOUT CONTRAST TECHNIQUE: Multidetector CT imaging of the right femur was performed according to the standard protocol. Multidetector CT imaging of the right tibia and fibula was performed according to the standard protocol. COMPARISON:  None. FINDINGS: Bones/Joint/Cartilage Air in the pubic symphysis with bone destruction consistent with symphyseal osteomyelitis with small amount air in the soft tissues along the anterior superficial aspect and air tracking in the pre vesicular space which is suboptimally visualized. No fracture or dislocation. Normal alignment. No joint effusion. Mild joint space narrowing of the right hip. Severe medial femorotibial compartment and lateral femorotibial compartment joint space narrowing consistent with advanced osteoarthritis. Mild patellofemoral compartment joint space narrowing. Mild osteoarthritis of the tibiotalar joint. Ligaments Ligaments are suboptimally evaluated by CT. Soft tissue, Muscles and Tendons Moderate atrophy of the semimembranosus muscle. Fatty atrophy of the rectus femoris muscle. Extensive soft tissue emphysema along the anterior aspect of the thigh extending medially along the lower thigh and knee and posterior medially into the lower leg to the level just above the ankle. Soft tissue emphysema in the rectus femoris  muscle. Extensive soft tissue emphysema in the adductor musculature most severe in the adductor magnus. Additionally there is a 2.4 x 4.2 x 6.8 cm hypodense area in the adductor magnus which may reflect an abscess or necrosis. Soft tissue emphysema in the sartorius muscle. Peripheral vascular atherosclerotic disease. Extensive soft tissue edema throughout the right thigh and bilateral lower legs likely reactive. IMPRESSION: 1. Extensive soft tissue emphysema along the anterior aspect of the thigh extending medially along the lower thigh and knee and posterior medially into the lower leg to the level just above the ankle most concerning for necrotizing fasciitis. Soft tissue emphysema in the rectus femoris muscle and adductor musculature as detailed above most concerning for necrotizing myositis. 2.4 x 4.2 x 6.8 cm hypodense area in the adductor magnus which may reflect an abscess or myonecrosis. Extensive symphyseal osteomyelitis which is partially visualized as the entire areas not included in the field of view. 2.  Tricompartmental osteoarthritis of the right knee. Critical Value/emergent results were called by telephone at the time of interpretation on 02/02/2020 at 12:17 pm to provider [facility] , who verbally acknowledged these results. Electronically Signed   By: Kathreen Devoid   On: 02/02/2020 12:19   US Venous Img Lower Unilateral Right (DVT)  Result Date: 02/01/2020 CLINICAL DATA:  Right lower extremity pain.  Evaluate for DVT. EXAM: RIGHT LOWER EXTREMITY VENOUS DOPPLER ULTRASOUND TECHNIQUE: Gray-scale sonography with graded compression, as well as color Doppler and duplex ultrasound were performed to evaluate the lower extremity deep venous systems from the level of the common femoral vein and including the common femoral, femoral, profunda femoral, popliteal and calf veins including the posterior tibial, peroneal and gastrocnemius veins when visible. The superficial great saphenous vein was also  interrogated. Spectral Doppler was utilized to evaluate flow at rest and with distal augmentation maneuvers in the common femoral, femoral and popliteal veins. COMPARISON:  None. FINDINGS: The examination is degraded due to ill-defined echogenic shadowing, presumably from subcutaneous air, partially obscuring the evaluation of the venous  structures from the level of the proximal to the distal anterior thigh. Contralateral Common Femoral Vein: Respiratory phasicity is normal and symmetric with the symptomatic side. No evidence of thrombus. Normal compressibility. Common Femoral Vein: No evidence of thrombus. Normal compressibility, respiratory phasicity and response to augmentation. Saphenofemoral Junction: No evidence of thrombus. Normal compressibility and flow on color Doppler imaging. Profunda Femoral Vein: No evidence of thrombus. Normal compressibility and flow on color Doppler imaging. Femoral Vein: No evidence of thrombus. Normal compressibility, respiratory phasicity and response to augmentation. Popliteal Vein: No evidence of thrombus. Normal compressibility, respiratory phasicity and response to augmentation. Calf Veins: No evidence of thrombus. Normal compressibility and flow on color Doppler imaging. Superficial Great Saphenous Vein: No evidence of thrombus. Normal compressibility. Venous Reflux:  None. Other Findings:  None. IMPRESSION: 1. No evidence of DVT within the right lower extremity. 2. Examination is degraded secondary to indeterminate ill-defined echogenic shadowing involving the anterior medial aspect of the thigh, nonspecific though potentially secondary to subcutaneous emphysema. Clinical correlation is advised. Further evaluation could performed with right femur radiographs or CT as indicated. Critical Value/emergent results were called by telephone at the time of interpretation on 02/01/2020 at 3:22 pm to provider [facility] , who verbally acknowledged these results. Electronically  Signed   By: Sandi Mariscal M.D.   On: 02/01/2020 15:29   DG CHEST PORT 1 VIEW  Result Date: 02/01/2020 CLINICAL DATA:  Central line placement EXAM: PORTABLE CHEST 1 VIEW COMPARISON:  None. FINDINGS: Right jugular central venous catheter with the tip projecting over the cavoatrial junction. Bilateral diffuse interstitial thickening. Trace left pleural effusion. No pneumothorax. Stable cardiomegaly. Prior CABG. No acute osseous abnormality. IMPRESSION: 1. Right jugular central venous catheter with the tip projecting over the cavoatrial junction. 2. Cardiomegaly with pulmonary vascular congestion. Electronically Signed   By: Kathreen Devoid   On: 02/01/2020 12:41   DG CHEST PORT 1 VIEW  Result Date: 02/01/2020 CLINICAL DATA:  Central line placement EXAM: PORTABLE CHEST 1 VIEW COMPARISON:  01/28/2020 FINDINGS: Bilateral interstitial thickening. Trace left pleural effusion. No right pleural effusion or pneumothorax. Stable cardiomegaly. Prior CABG. Interval placement of a right sided central venous catheter with the tip overlying the right axilla. Recommend repositioning catheter prior to use. IMPRESSION: Interval placement of a right sided central venous catheter with the tip overlying the right axilla. Recommend repositioning catheter prior to use. Electronically Signed   By: Kathreen Devoid   On: 02/01/2020 12:16   ECHOCARDIOGRAM COMPLETE  Result Date: 02/01/2020    ECHOCARDIOGRAM REPORT   Patient Name:   Bryan Brewer Date of Exam: 02/01/2020 Medical Rec #:  [id number]      Height:       67.0 in Accession #:    [id number]     Weight:       163.6 lb Date of Birth:  1940-11-25      BSA:          1.857 m Patient Age:    53 years       BP:           92/50 mmHg Patient Gender: M              HR:           125 bpm. Exam Location:  [facility] Procedure: 2D Echo, Cardiac Doppler and Color Doppler Indications:     Atrial Flutter 427.32  History:         Patient has prior history of Echocardiogram examinations,  most                   recent 03/31/2019. Prior CABG, COPD; Risk Factors:Diabetes and                  Hypertension.  Sonographer:     [name] RDCS (AE) Referring Phys:  [place] Diagnosing Phys: Kathlyn Sacramento MD IMPRESSIONS  1. Left ventricular ejection fraction, by estimation, is 35 to 40%. The left ventricle has moderately decreased function. The left ventricle demonstrates global hypokinesis. Left ventricular diastolic parameters are indeterminate.  2. Right ventricular systolic function is normal. The right ventricular size is normal. There is normal pulmonary artery systolic pressure.  3. Left atrial size was moderately dilated.  4. Right atrial size was mildly dilated.  5. The mitral valve is normal in structure. Moderate mitral valve regurgitation. No evidence of mitral stenosis.  6. The aortic valve is normal in structure. Aortic valve regurgitation is not visualized. Mild aortic valve sclerosis is present, with no evidence of aortic valve stenosis.  7. The inferior vena cava is normal in size with greater than 50% respiratory variability, suggesting right atrial pressure of 3 mmHg. FINDINGS  Left Ventricle: Left ventricular ejection fraction, by estimation, is 35 to 40%. The left ventricle has moderately decreased function. The left ventricle demonstrates global hypokinesis. The left ventricular internal cavity size was normal in size. There is no left ventricular hypertrophy. Left ventricular diastolic parameters are indeterminate. Right Ventricle: The right ventricular size is normal. No increase in right ventricular wall thickness. Right ventricular systolic function is normal. There is normal pulmonary artery systolic pressure. The tricuspid regurgitant velocity is 2.52 m/s, and  with an assumed right atrial pressure of 5 mmHg, the estimated right ventricular systolic pressure is 72.0 mmHg. Left Atrium: Left atrial size was moderately dilated. Right Atrium: Right atrial size was mildly dilated.  Pericardium: There is no evidence of pericardial effusion. Mitral Valve: The mitral valve is normal in structure. Normal mobility of the mitral valve leaflets. Moderate mitral valve regurgitation. No evidence of mitral valve stenosis. Tricuspid Valve: The tricuspid valve is normal in structure. Tricuspid valve regurgitation is mild . No evidence of tricuspid stenosis. Aortic Valve: The aortic valve is normal in structure. Aortic valve regurgitation is not visualized. Mild aortic valve sclerosis is present, with no evidence of aortic valve stenosis. Aortic valve mean gradient measures 2.0 mmHg. Aortic valve peak gradient measures 3.6 mmHg. Aortic valve area, by VTI measures 3.31 cm. Pulmonic Valve: The pulmonic valve was normal in structure. Pulmonic valve regurgitation is not visualized. No evidence of pulmonic stenosis. Aorta: The aortic root is normal in size and structure. Venous: The inferior vena cava is normal in size with greater than 50% respiratory variability, suggesting right atrial pressure of 3 mmHg. IAS/Shunts: No atrial level shunt detected by color flow Doppler.  LEFT VENTRICLE PLAX 2D LVIDd:         4.74 cm  Diastology LVIDs:         3.96 cm  LV e' lateral:   4.57 cm/s LV PW:         1.02 cm  LV E/e' lateral: 19.8 LV IVS:        1.04 cm  LV e' medial:    5.00 cm/s LVOT diam:     2.00 cm  LV E/e' medial:  18.1 LV SV:         45 LV SV Index:   24 LVOT Area:  3.14 cm  RIGHT VENTRICLE RV Basal diam:  4.16 cm RV S prime:     9.14 cm/s TAPSE (M-mode): 3.4 cm LEFT ATRIUM              Index       RIGHT ATRIUM           Index LA diam:        5.10 cm  2.75 cm/m  RA Area:     20.60 cm LA Vol (A2C):   112.0 ml 60.32 ml/m RA Volume:   55.20 ml  29.73 ml/m LA Vol (A4C):   118.0 ml 63.55 ml/m LA Biplane Vol: 115.0 ml 61.94 ml/m  AORTIC VALVE                   PULMONIC VALVE AV Area (Vmax):    2.31 cm    PV Vmax:        0.73 m/s AV Area (Vmean):   2.25 cm    PV Peak grad:   2.1 mmHg AV Area (VTI):      3.31 cm    RVOT Peak grad: 4 mmHg AV Vmax:           94.70 cm/s AV Vmean:          68.550 cm/s AV VTI:            0.136 m AV Peak Grad:      3.6 mmHg AV Mean Grad:      2.0 mmHg LVOT Vmax:         69.50 cm/s LVOT Vmean:        49.100 cm/s LVOT VTI:          0.144 m LVOT/AV VTI ratio: 1.05  AORTA Ao Root diam: 3.40 cm MITRAL VALVE               TRICUSPID VALVE MV Area (PHT): 5.54 cm    TR Peak grad:   25.4 mmHg MV Decel Time: 137 msec    TR Vmax:        252.00 cm/s MV E velocity: 90.70 cm/s MV A velocity: 46.10 cm/s  SHUNTS MV E/A ratio:  1.97        Systemic VTI:  0.14 m                            Systemic Diam: 2.00 cm Kathlyn Sacramento MD Electronically signed by Kathlyn Sacramento MD Signature Date/Time: 02/01/2020/10:09:19 AM    Final      Medications:    . sodium chloride   Intravenous Once   acetaminophen, antiseptic oral rinse, glycopyrrolate **OR** glycopyrrolate **OR** glycopyrrolate, haloperidol **OR** haloperidol **OR** haloperidol lactate, HYDROmorphone (DILAUDID) injection, ipratropium-albuterol, LORazepam **OR** LORazepam **OR** LORazepam, ondansetron **OR** ondansetron (ZOFRAN) IV, polyvinyl alcohol  Assessment/ Plan:  [name] is a 79 y.o. white male with diabetes mellitus type II, coronary artery disease status post CABG, COPD, history of bladder cancer with ileal conduit, left hip fracture status post ORIF earlier this month who is admitted to [facility] on 01/28/2020 for Hyponatremia [E87.1] Weakness [R53.1] Localized swelling of left lower leg [R22.42]  1. Acute renal failure on chronic kidney disease stage IV: baseline creatinine 3.19, GFR of 18 on 01/18/20.  With anion gap metabolic acidosis on nonanion gap acidosis.  No indication for dialysis today.  - Continue IV sodium bicarbonate.  - holding furosemide.   2. Hyponatremia: secondary to congestive heart failure.  -  discontinued dextrose  3. Hypotension: with atrial flutter with RVR and systolic congestive heart  failure Appreciate cardiology input. Scheduled for TEE cardioversion for tomorrow.  - midodrine    LOS: 5 Rivkah Wolz 6/29/20213:17 PM
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
## 2020-02-02 NOTE — Progress Notes (Signed)
Daily Progress Note   Patient Name: Bryan Brewer       Date: 02/02/2020 DOB: 1941-03-17  Age: 79 y.o. MRN#: [id number] Attending Physician: Cristal Ford, DO Primary Care Physician: Tracie Harrier, MD Admit Date: 01/28/2020  Reason for Consultation/Follow-up: Establishing goals of care  Subjective: Patient sitting up eating lunch. Wife and cousin at bedside. They understand situation and poor prognosis. [place] discussion as below.  Length of Stay: 5  Current Medications: Scheduled Meds:  . sodium chloride   Intravenous Once    Continuous Infusions:   PRN Meds: acetaminophen, antiseptic oral rinse, glycopyrrolate **OR** glycopyrrolate **OR** glycopyrrolate, haloperidol **OR** haloperidol **OR** haloperidol lactate, HYDROmorphone (DILAUDID) injection, ipratropium-albuterol, LORazepam **OR** LORazepam **OR** LORazepam, ondansetron **OR** ondansetron (ZOFRAN) IV, polyvinyl alcohol  Physical Exam Constitutional:      General: He is not in acute distress. Cardiovascular:     Rate and Rhythm: Tachycardia present.  Pulmonary:     Effort: Pulmonary effort is normal. No respiratory distress.  Skin:    General: Skin is warm and dry.  Neurological:     Mental Status: He is alert and oriented to person, place, and time.  Psychiatric:        Mood and Affect: Mood normal.        Behavior: Behavior normal.             Vital Signs: BP 109/62   Pulse (!) 127   Temp 98.2 F (36.8 C) (Oral)   Resp 15   Ht _0  (1.702 m)   Wt 76.5 kg   SpO2 93%   BMI 26.41 kg/m  SpO2: SpO2: 93 % O2 Device: O2 Device: Room Air O2 Flow Rate: O2 Flow Rate (L/min): 2 L/min  Intake/output summary:   Intake/Output Summary (Last 24 hours) at 02/02/2020 1458 Last data filed at 02/02/2020 1215 Gross per 24 hour    Intake 1902.49 ml  Output 1000 ml  Net 902.49 ml   LBM: Last BM Date: 01/31/20 Baseline Weight: Weight: 67.6 kg Most recent weight: Weight: 76.5 kg       Palliative Assessment/Data: PPS 40%    Flowsheet Rows     Most Recent Value  Intake Tab  Referral Department Critical care  Unit at Time of Referral ICU  Palliative Care Primary Diagnosis Sepsis/Infectious Disease  Date Notified 02/01/20  Palliative Care Type New Palliative care  Reason for referral Clarify Goals of Care  Date of Admission 01/29/20  Date first seen by Palliative Care 02/01/20  # of days Palliative referral response time 0 Day(s)  # of days IP prior to Palliative referral 3  Clinical Assessment  Palliative Performance Scale Score 40%  Psychosocial & Spiritual Assessment  Palliative Care Outcomes  Patient/Family meeting held? Yes  Who was at the meeting? patient, spouse, cousin  Palliative Care Outcomes Clarified goals of care, Provided psychosocial or spiritual support, Changed CPR status      Patient Active Problem List   Diagnosis Date Noted  . 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 Goals of care, counseling/discussion   . Palliative care by specialist   . DNR (do not resuscitate)   . Atypical atrial flutter ([place])   . DVT (deep venous thrombosis) ([place])  01/28/2020  . Presence of urostomy ([place]) 01/14/2020  . Closed left hip fracture, initial encounter ([place]) 01/13/2020  . Accidental fall 01/13/2020  . Preoperative clearance 01/13/2020  . Hyperkalemia 12/17/2019  . Abdominal pain 09/18/2019  . Iron deficiency anemia 07/14/2019  . PSVT (paroxysmal supraventricular tachycardia) ([place]) 06/27/2019  . Aortic atherosclerosis ([place]) 06/16/2019  . Paroxysmal atrial fibrillation ([place]) 06/15/2019  . Bladder cancer ([place]) 06/02/2019  . BPH (benign prostatic hyperplasia) 06/02/2019  . Anemia 05/21/2019  . Chronic heart failure with preserved ejection fraction (HFpEF) ([place]) 05/14/2019  . Coronary artery disease  involving native coronary artery of native heart without angina pectoris 05/14/2019  . Cellulitis of lower extremity 05/14/2019  . Skin ulcer of calf, limited to breakdown of skin ([place]) 05/14/2019  . Hypoglycemia 04/29/2019  . Hypotension 04/29/2019  . Tachycardia   . GIB (gastrointestinal bleeding) 04/20/2019  . Pressure injury of skin 03/31/2019  . HCAP (healthcare-associated pneumonia) 03/29/2019  . UTI (urinary tract infection) 03/29/2019  . Sepsis secondary to UTI ([place]) 03/29/2019  . Hyponatremia 03/29/2019  . Acute on chronic renal failure ([place]) 02/14/2019  . Osteoarthritis of knee 01/06/2019  . Chronic systolic CHF (congestive heart failure) ([place]) 11/20/2018  . Primary osteoarthritis involving multiple joints 08/02/2018  . Presence of aortocoronary bypass graft 11/08/2017  . NSTEMI (non-ST elevated myocardial infarction) ([place]) 10/30/2017  . Diabetes ([place]) 10/30/2017  . CKD (chronic kidney disease) stage 4, GFR 15-29 ml/min (HCC) 10/30/2017  . HTN (hypertension) 10/30/2017  . HLD (hyperlipidemia) 10/30/2017  . COPD (chronic obstructive pulmonary disease) ([place]) 10/30/2017  . Spinal stenosis of lumbar region 08/20/2017  . Lumbar radiculopathy 08/09/2017  . Hx of bladder cancer 04/30/2016  . Pure hypercholesterolemia 09/08/2015  . Simple chronic bronchitis ([place]) 09/08/2015  . Hypomagnesemia 03/30/2015  . Benign essential hypertension 03/30/2015  . Abdominal wall hernia at previous stoma site 01/12/2013  . Diarrhea in adult patient 01/12/2013    Palliative Care Assessment & Plan   HPI: 79 y.o. male  with past medical history of CAD s/p CABG, HF, CKD 4, COPD, T2DM, and bladder cancer s/p cystoprostatectomy with urostomy and ileal conduit admitted on 01/28/2020 with weakness and nausea.  Patient recently discharged earlier this month following intramedullary nail placement in left hip. On admission found to have a fib RVR. Labs reveal worsening renal function, anemia, and  hyponatremia. Venous doppler revealed left lower extremity DVT. Patient has had hypotension throughout hospitalizations. Also being treated for urosepsis. Echo 6/28 with EF 35-40%. PMT consulted for [place].   6/29: Crepitus noted in right leg by Dr. Ree Kida this morning, with subsequent CT showing extensive soft tissue gas and possible abscess/necrosis of the rectus femoris and adductor musculature concerning for necrotizing myositis.  Cardioversion cancelled on 6/29 after CT results obtained. Patient unlikely to survive RLE infection without surgery and is a poor surgical candidate. Recommendations have been made for full comfort care.   Assessment: Met with patient, wife, and cousin. All are aware of poor prognosis and limited options. We discussed his infection and that he is a poor surgical candidate d/t his multiorgan failure. We discussed transition of care to full comfort measures. Family asks questions about benefit of continuing antibiotics. We discuss that it is unlikely to change the ultimate outcome. They all express agreement and express a desire to promote comfort above all else.   We discuss transition to comfort measures - discussed used of medications for symptom management and elimination of medications and procedures not necessary to promote comfort.  We  discussed options moving forward. Patient states he would like to stay in the hospital. We discussed other options of hospice facility vs home with hospice. Patient and wife are not interested in going home as they do not feel they can provide level of care he needs at home. They are open to the hospice facility, but request time to see how patient is tomorrow and discuss with family. We discussed that if patient remains fairly stable we could further discuss transfer to hospice home. We discuss hospice philosophy of care.   Recommendations/Plan:  Full comfort measures - d/c'd all measures not necessary to promote comfort  PRN  dilaudid added, morphine d/c'd d/t renal failure  Also added PRN ativan, haldol, robinul  Will f/u tomorrow - family considering hospice facility but unsure  Goals of Care and Additional Recommendations:  Limitations on Scope of Treatment: Full Comfort Care  Code Status:  DNR  Discharge Planning:  To Be Determined  Care plan was discussed with Dr. Mortimer Fries, Dr Ree Kida, ICU RN, patient, wife, and cousin  Thank you for allowing the Palliative Medicine Team to assist in the care of this patient.   Total Time 35 minutes Prolonged Time Billed  no       Greater than 50%  of this time was spent counseling and coordinating care related to the above assessment and plan.  Juel Burrow, DNP, [facility] Palliative Medicine Team Team Phone # [phone]  Pager [phone]

## 2020-02-02 NOTE — Progress Notes (Addendum)
PROGRESS NOTE    Bryan Brewer  RFX:[id number] DOB: 1941/05/01 DOA: 01/28/2020 PCP: Tracie Harrier, MD   Brief Narrative:  HPI on 01/28/2020 by Dr. Aretta Nip Agbata Bryan Brewer is a 79 y.o. male with medical history significant forCAD, HFpEF, CKD 4, COPD,bladder cancer status post cystoprostatectomy with urostomy and ileal conduit,CADs/pCABG who presents to the emergency room for evaluation of weakness and nausea.  Patient and her spouse were also concerned that he had some blood in his urine and had been on a blood thinner since he was discharged from the hospital following an intramedullary nail placement in his left hip. He denies having any fever or chills, denies having any chest pain, abdominal pain or changes in his bowel habits. Labs revealed worsening of his renal function from baseline, his serum creatinine on discharge was 2.86 and today on admission it is 4.06, sodium is low at 123 and he has a white count of 14.5 with a hemoglobin of 8.2 that is chronic. Chest x-ray shows no acute findings and he has pyuria that appears to be chronic. Patient has swelling of his left lower extremity and venous Doppler shows Positive for acute nonocclusive DVT within the popliteal vein.  Interim history Patient was admitted with new onset atrial fibrillation.  Now with hypotension and currently in the ICU.  Cardiology consulted and hoping to possibly do cardioversion.  Patient also being treated for sepsis secondary to UTI-showed Enterococcus.  Unfortunately patient was on Merrem for history of ESBL.  Was changed to vancomycin once culture results were available.  Unfortunately patient's WBC continues to rise.  Patient was complaining of right lower extremity pain on 02/01/2020, ultrasound was ordered.  There was question of crepitus.  On physical examination patient has a right medial thigh crepitus, CT scan ordered.  Patient continues to have atrial fibrillation with RVR, cardiology planning for  cardioversion later today.  Assessment & Plan   Addendum:  CT of the right femur/tibia-fibula showed extensive soft tissue emphysema along the anterior aspect of the thigh extending medially along the lower thigh and knee and posterior medially into the lower leg, concerning for necrotizing fasciitis.  Soft tissue emphysema in the rectus femoris muscle and adductor musculature concerning for necrotizing myositis.  2.4 x 4.2 x 6.8 cm hypodense area in the abductor magnus which may reflect abscess or myonecrosis.  Extensive symphyseal osteomyelitis.   After receiving results of CT scan, met with patient and wife along with the RN at bedside.  Discussed canceling patient's cardioversion today as this would likely not help the situation.  Also discussed that patient would ultimately need surgery for the CT findings however given his current cardiac and respiratory issues, likely would not be a candidate for surgery or anything invasive at this time.  Recommended transitioning to comfort care.  Cardiology, PCCM, palliative care also consulted for similar recommendations.  Patient now transition to comfort care.  New onset Atrial fibrillation -Rate had noted to be up to 140s yesterday, patient was placed on amiodarone drip as well as heparin drip -Cardiology consulted and appreciated -Atrial fibrillation treatment complicated by hypotension -Echocardiogram ordered -TSH 1.818 (see WNL) -Continue to keep potassium at 4 and magnesium 2 -Currently on heparin -Discussed with cardiology today, planning for TEE-DCCV later today  Hypotension -Has moved patient to ICU -Given IV fluid bolus -Placed on midodrine 10 mg 3 times daily -Blood pressures currently stable  Sepsis secondary to UTI -Present on admission, patient was noted to have leukocytosis with tachycardia -UA  on admission: Showed moderate leukocytes, 21-50 WBC, no bacteria seen -Unfortunately urine culture was not done, urine culture:  Enterococcus faecalis -Blood cultures show no growth to date (however were taken after antibiotics were started) -Patient was placed on Merrem for history of ESBL-however this was discontinued due to urine culture results.  Patient now on vancomycin  Acute left lower extremity DVT -Patient with recent left hip fracture status post intramedullary nail and nonadherence to Lovenox DVT prophylaxis -Extremity Doppler positive for acute nonocclusive DVT within the popliteal vein -Do not think patient needs VQ scan given that he will need to be on anticoagulation for DVT and AF -Place patient on heparin drip -Patient now complaining of right lower extremity pain, will also order right lower extremity Doppler to rule out DVT although this would not change treatment plan  Right lower extremity crepitus, medial thigh -patient complained of right lower extremity pain on 02/01/2020 -Ultrasound obtained showing no evidence of DVT.  Although patient did have an indeterminate ill-defined echogenic shadowing involving the anterior medial aspect of the thigh. -On examination, patient does have palpable crepitus -Will obtain CT to evaluate further -States that the pain is better today -Continue pain control  Acute kidney injury on chronic kidney disease, stage IV with metabolic acidosis -Baseline creatinine approximately 3.4, however on admission was 4.06 -Placed on IV fluids, creatinine currently down to 3.77 -Nephrology consulted and appreciated, started patient on sodium bicarbonate as well as IV fluids -Continue to monitor BMP  Hyponatremia -Sodium was 123 on admission -Based on IV fluids, sodium up to 133 -Continue to monitor BMP  Chronic combined systolic and diastolic heart failure -Appears to be euvolemic-although patient does have upper extremity edema -Echocardiogram on 03/31/2019 shows an EF of 60 to 65% -Continue to monitor intake and output, daily weights  COPD -Appears to be stable,  will continue to monitor  Acute on chronic anemia of chronic disease -Hemoglobin down to 6.6, despite receiving blood transfusion on 02/01/2020 -Will order additional unit of transfusion today -Continue to monitor CBC  Diabetes mellitus, type II with hypoglycemic events -Last hemoglobin A1c 6.4 on 01/13/2020 -Continue to monitor  Goals of care -Palliative care consulted and appreciated, patient now DNR/DNI  DVT Prophylaxis Heparin  Code Status: DNR  Family Communication: None at bedside.   Disposition Plan:  Status is: Inpatient  Remains inpatient appropriate because:Hemodynamically unstable, Ongoing diagnostic testing needed not appropriate for outpatient work up, IV treatments appropriate due to intensity of illness or inability to take PO and Inpatient level of care appropriate due to severity of illness   Dispo: The patient is from: Home              Anticipated d/c is to: TBD              Anticipated d/c date is: > 3 days              Patient currently is not medically stable to d/c.  Consultants Cardiology PCCM  Procedures  Left lower extremity Doppler  Antibiotics   Anti-infectives (From admission, onward)   Start     Dose/Rate Route Frequency Ordered Stop   01/31/20 1700  vancomycin (VANCOCIN) IVPB 1000 mg/200 mL premix     Discontinue     1,000 mg 200 mL/hr over 60 Minutes Intravenous Every 48 hours 01/31/20 1550     01/28/20 1800  meropenem (MERREM) 500 mg in sodium chloride 0.9 % 100 mL IVPB  Status:  Discontinued  500 mg 200 mL/hr over 30 Minutes Intravenous Every 12 hours 01/28/20 1737 01/31/20 1550      Subjective:   Bryan Brewer seen and examined today.  Patient feels right lower extremity pain has improved.  Denies current chest pain, shortness of breath, dizziness or headache, abdominal pain, nausea or vomiting this morning.   Objective:   Vitals:   02/02/20 0600 02/02/20 0900 02/02/20 0915 02/02/20 0930  BP: (!) 103/59  108/61 109/62    Pulse: (!) 125 (!) 127 (!) 127   Resp: 13  15   Temp:  98.1 F (36.7 C) 98.1 F (36.7 C) 98.2 F (36.8 C)  TempSrc:  Oral Oral Oral  SpO2: 100% 95%  93%  Weight:      Height:        Intake/Output Summary (Last 24 hours) at 02/02/2020 0949 Last data filed at 02/02/2020 0647 Gross per 24 hour  Intake 1527.49 ml  Output 1000 ml  Net 527.49 ml   Filed Weights   01/31/20 0500 02/01/20 0500 02/02/20 0500  Weight: 73.7 kg 74.2 kg 76.5 kg   Exam  General: Well developed, chronically ill-appearing, NAD  HEENT: NCAT,  mucous membranes moist.   Cardiovascular: S1 S2 auscultated, soft SEM, irregularly irregular  Respiratory: Diminished breath sounds  Abdomen: Soft, nontender, nondistended, + bowel sounds  Extremities: warm dry without cyanosis clubbing.  LLE edema.  Bruising and scabs noted on lower extremities.  Bilateral upper extremity edema.  Crepitus noted on the right medial anterior thigh.  Neuro: AAOx3, nonfocal  Psych: Pleasant, appropriate mood and affect   Data Reviewed: I have personally reviewed following labs and imaging studies  CBC: Recent Labs  Lab 01/28/20 0425 01/28/20 0425 01/29/20 0255 01/30/20 0730 01/31/20 0421 02/01/20 0310 02/02/20 0444  WBC 14.5*   < > 11.0* 13.7* 15.6* 16.3* 23.1*  NEUTROABS 13.4*  --   --   --   --   --   --   HGB 8.2*   < > 7.5* 7.6* 7.2* 7.0* 6.6*  HCT 26.5*   < > 23.3* 23.9* 22.6* 22.2* 19.8*  MCV 87.7   < > 83.5 85.7 83.4 85.1 81.1  PLT 306   < > 242 258 286 271 273   < > = values in this interval not displayed.   Basic Metabolic Panel: Recent Labs  Lab 01/29/20 0255 01/29/20 1414 01/30/20 0449 01/31/20 0421 02/01/20 0310 02/02/20 0444  NA 127*  --  130* 131* 133* 133*  K 5.1  --  4.8 4.9 5.0 5.0  CL 98  --  101 103 105 103  CO2 17*  --  17* 17* 16* 19*  GLUCOSE 83  --  156* 151* 138* 163*  BUN 101*  --  95* 94* 88* 88*  CREATININE 3.89*  --  3.64* 3.48* 3.58* 3.77*  CALCIUM 7.3*  --  7.5* 7.8* 7.9*  8.1*  MG  --  1.8  --   --   --   --    GFR: Estimated Creatinine Clearance: 15.1 mL/min (A) (by C-G formula based on SCr of 3.77 mg/dL (H)). Liver Function Tests: Recent Labs  Lab 01/28/20 0425  AST 25  ALT 8  ALKPHOS 138*  BILITOT 1.2  PROT 6.7  ALBUMIN 2.1*   No results for input(s): LIPASE, AMYLASE in the last 168 hours. No results for input(s): AMMONIA in the last 168 hours. Coagulation Profile: Recent Labs  Lab 01/28/20 2313  INR 1.5*   Cardiac Enzymes:  No results for input(s): CKTOTAL, CKMB, CKMBINDEX, TROPONINI in the last 168 hours. BNP (last 3 results) No results for input(s): PROBNP in the last 8760 hours. HbA1C: No results for input(s): HGBA1C in the last 72 hours. CBG: Recent Labs  Lab 01/28/20 2201 01/29/20 0244 01/29/20 0558 01/29/20 0759 01/29/20 1004  GLUCAP 85 85 74 118* 189*   Lipid Profile: No results for input(s): CHOL, HDL, LDLCALC, TRIG, CHOLHDL, LDLDIRECT in the last 72 hours. Thyroid Function Tests: No results for input(s): TSH, T4TOTAL, FREET4, T3FREE, THYROIDAB in the last 72 hours. Anemia Panel: No results for input(s): VITAMINB12, FOLATE, FERRITIN, TIBC, IRON, RETICCTPCT in the last 72 hours. Urine analysis:    Component Value Date/Time   COLORURINE YELLOW (A) 01/28/2020 0959   APPEARANCEUR CLOUDY (A) 01/28/2020 0959   LABSPEC 1.010 01/28/2020 0959   PHURINE 7.0 01/28/2020 0959   GLUCOSEU NEGATIVE 01/28/2020 0959   HGBUR SMALL (A) 01/28/2020 0959   BILIRUBINUR NEGATIVE 01/28/2020 0959   KETONESUR NEGATIVE 01/28/2020 0959   PROTEINUR 100 (A) 01/28/2020 0959   NITRITE NEGATIVE 01/28/2020 0959   LEUKOCYTESUR MODERATE (A) 01/28/2020 0959   Sepsis Labs: @LABRCNTIP (procalcitonin:4,lacticidven:4)  ) Recent Results (from the past 240 hour(s))  SARS Coronavirus 2 by RT PCR (hospital order, performed in [place] hospital lab) Nasopharyngeal Nasopharyngeal Swab     Status: None   Collection Time: 01/28/20  9:04 AM   Specimen:  Nasopharyngeal Swab  Result Value Ref Range Status   SARS Coronavirus 2 NEGATIVE NEGATIVE Final    Comment: (NOTE) SARS-CoV-2 target nucleic acids are NOT DETECTED.  The SARS-CoV-2 RNA is generally detectable in upper and lower respiratory specimens during the acute phase of infection. The lowest concentration of SARS-CoV-2 viral copies this assay can detect is 250 copies / mL. A negative result does not preclude SARS-CoV-2 infection and should not be used as the sole basis for treatment or other patient management decisions.  A negative result may occur with improper specimen collection / handling, submission of specimen other than nasopharyngeal swab, presence of viral mutation(s) within the areas targeted by this assay, and inadequate number of viral copies (<250 copies / mL). A negative result must be combined with clinical observations, patient history, and epidemiological information.  Fact Sheet for Patients:   StrictlyIdeas.no  Fact Sheet for Healthcare Providers: BankingDealers.co.za  This test is not yet approved or  cleared by the [place] FDA and has been authorized for detection and/or diagnosis of SARS-CoV-2 by FDA under an Emergency Use Authorization (EUA).  This EUA will remain in effect (meaning this test can be used) for the duration of the COVID-19 declaration under Section 564(b)(1) of the Act, 21 U.S.C. section 360bbb-3(b)(1), unless the authorization is terminated or revoked sooner.  Performed at [facility], [address] [place], [place] [address]   MRSA PCR Screening     Status: None   Collection Time: 01/29/20 10:10 AM   Specimen: Nasopharyngeal  Result Value Ref Range Status   MRSA by PCR NEGATIVE NEGATIVE Final    Comment:        The GeneXpert MRSA Assay (FDA approved for NASAL specimens only), is one component of a comprehensive MRSA colonization surveillance program. It is  not intended to diagnose MRSA infection nor to guide or monitor treatment for MRSA infections. Performed at [facility], [place], [place] [address]   CULTURE, BLOOD (ROUTINE X 2) w Reflex to ID Panel     Status: None (Preliminary result)  Collection Time: 01/29/20  2:14 PM   Specimen: BLOOD  Result Value Ref Range Status   Specimen Description BLOOD BLOOD LEFT HAND  Final   Special Requests   Final    BOTTLES DRAWN AEROBIC AND ANAEROBIC Blood Culture adequate volume   Culture   Final    NO GROWTH 4 DAYS Performed at [facility], [address] [place], [place] [address]    Report Status PENDING  Incomplete  CULTURE, BLOOD (ROUTINE X 2) w Reflex to ID Panel     Status: None (Preliminary result)   Collection Time: 01/29/20  3:07 PM   Specimen: BLOOD  Result Value Ref Range Status   Specimen Description BLOOD BLOOD LEFT HAND  Final   Special Requests   Final    BOTTLES DRAWN AEROBIC AND ANAEROBIC Blood Culture results may not be optimal due to an excessive volume of blood received in culture bottles   Culture   Final    NO GROWTH 4 DAYS Performed at [facility], [address], [place], [place] [address]    Report Status PENDING  Incomplete  Urine Culture     Status: Abnormal   Collection Time: 01/30/20  1:56 PM   Specimen: Urine, Clean Catch  Result Value Ref Range Status   Specimen Description   Final    URINE, CLEAN CATCH Performed at [facility], [address], [place], [place] [address]    Special Requests   Final    NONE Performed at [facility], [address] [place], [place] [address]    Culture >=100,000 COLONIES/mL ENTEROCOCCUS FAECIUM (A)  Final   Report Status 02/01/2020 FINAL  Final   Organism ID, Bacteria ENTEROCOCCUS FAECIUM (A)  Final      Susceptibility   Enterococcus faecium - MIC*    AMPICILLIN >=32 RESISTANT Resistant     NITROFURANTOIN 256 RESISTANT Resistant     VANCOMYCIN  <=0.5 SENSITIVE Sensitive     * >=100,000 COLONIES/mL ENTEROCOCCUS FAECIUM      Radiology Studies: US Venous Img Lower Unilateral Right (DVT)  Result Date: 02/01/2020 CLINICAL DATA:  Right lower extremity pain.  Evaluate for DVT. EXAM: RIGHT LOWER EXTREMITY VENOUS DOPPLER ULTRASOUND TECHNIQUE: Gray-scale sonography with graded compression, as well as color Doppler and duplex ultrasound were performed to evaluate the lower extremity deep venous systems from the level of the common femoral vein and including the common femoral, femoral, profunda femoral, popliteal and calf veins including the posterior tibial, peroneal and gastrocnemius veins when visible. The superficial great saphenous vein was also interrogated. Spectral Doppler was utilized to evaluate flow at rest and with distal augmentation maneuvers in the common femoral, femoral and popliteal veins. COMPARISON:  None. FINDINGS: The examination is degraded due to ill-defined echogenic shadowing, presumably from subcutaneous air, partially obscuring the evaluation of the venous structures from the level of the proximal to the distal anterior thigh. Contralateral Common Femoral Vein: Respiratory phasicity is normal and symmetric with the symptomatic side. No evidence of thrombus. Normal compressibility. Common Femoral Vein: No evidence of thrombus. Normal compressibility, respiratory phasicity and response to augmentation. Saphenofemoral Junction: No evidence of thrombus. Normal compressibility and flow on color Doppler imaging. Profunda Femoral Vein: No evidence of thrombus. Normal compressibility and flow on color Doppler imaging. Femoral Vein: No evidence of thrombus. Normal compressibility, respiratory phasicity and response to augmentation. Popliteal Vein: No evidence of thrombus. Normal compressibility, respiratory phasicity and response to augmentation. Calf Veins: No evidence of thrombus. Normal compressibility and  flow on color Doppler imaging.  Superficial Great Saphenous Vein: No evidence of thrombus. Normal compressibility. Venous Reflux:  None. Other Findings:  None. IMPRESSION: 1. No evidence of DVT within the right lower extremity. 2. Examination is degraded secondary to indeterminate ill-defined echogenic shadowing involving the anterior medial aspect of the thigh, nonspecific though potentially secondary to subcutaneous emphysema. Clinical correlation is advised. Further evaluation could performed with right femur radiographs or CT as indicated. Critical Value/emergent results were called by telephone at the time of interpretation on 02/01/2020 at 3:22 pm to provider [facility] , who verbally acknowledged these results. Electronically Signed   By: Sandi Mariscal M.D.   On: 02/01/2020 15:29   DG CHEST PORT 1 VIEW  Result Date: 02/01/2020 CLINICAL DATA:  Central line placement EXAM: PORTABLE CHEST 1 VIEW COMPARISON:  None. FINDINGS: Right jugular central venous catheter with the tip projecting over the cavoatrial junction. Bilateral diffuse interstitial thickening. Trace left pleural effusion. No pneumothorax. Stable cardiomegaly. Prior CABG. No acute osseous abnormality. IMPRESSION: 1. Right jugular central venous catheter with the tip projecting over the cavoatrial junction. 2. Cardiomegaly with pulmonary vascular congestion. Electronically Signed   By: Kathreen Devoid   On: 02/01/2020 12:41   DG CHEST PORT 1 VIEW  Result Date: 02/01/2020 CLINICAL DATA:  Central line placement EXAM: PORTABLE CHEST 1 VIEW COMPARISON:  01/28/2020 FINDINGS: Bilateral interstitial thickening. Trace left pleural effusion. No right pleural effusion or pneumothorax. Stable cardiomegaly. Prior CABG. Interval placement of a right sided central venous catheter with the tip overlying the right axilla. Recommend repositioning catheter prior to use. IMPRESSION: Interval placement of a right sided central venous catheter with the tip overlying the right axilla. Recommend  repositioning catheter prior to use. Electronically Signed   By: Kathreen Devoid   On: 02/01/2020 12:16   ECHOCARDIOGRAM COMPLETE  Result Date: 02/01/2020    ECHOCARDIOGRAM REPORT   Patient Name:   Bryan Brewer Date of Exam: 02/01/2020 Medical Rec #:  [id number]      Height:       67.0 in Accession #:    [id number]     Weight:       163.6 lb Date of Birth:  [birth year]      BSA:          1.857 m Patient Age:    39 years       BP:           92/50 mmHg Patient Gender: M              HR:           125 bpm. Exam Location:  [facility] Procedure: 2D Echo, Cardiac Doppler and Color Doppler Indications:     Atrial Flutter 427.32  History:         Patient has prior history of Echocardiogram examinations, most                  recent 03/31/2019. Prior CABG, COPD; Risk Factors:Diabetes and                  Hypertension.  Sonographer:     [name] RDCS (AE) Referring Phys:  [place] Diagnosing Phys: Kathlyn Sacramento MD IMPRESSIONS  1. Left ventricular ejection fraction, by estimation, is 35 to 40%. The left ventricle has moderately decreased function. The left ventricle demonstrates global hypokinesis. Left ventricular diastolic parameters are indeterminate.  2. Right ventricular systolic function is normal. The right ventricular size is normal.  There is normal pulmonary artery systolic pressure.  3. Left atrial size was moderately dilated.  4. Right atrial size was mildly dilated.  5. The mitral valve is normal in structure. Moderate mitral valve regurgitation. No evidence of mitral stenosis.  6. The aortic valve is normal in structure. Aortic valve regurgitation is not visualized. Mild aortic valve sclerosis is present, with no evidence of aortic valve stenosis.  7. The inferior vena cava is normal in size with greater than 50% respiratory variability, suggesting right atrial pressure of 3 mmHg. FINDINGS  Left Ventricle: Left ventricular ejection fraction, by estimation, is 35 to 40%. The left ventricle has moderately  decreased function. The left ventricle demonstrates global hypokinesis. The left ventricular internal cavity size was normal in size. There is no left ventricular hypertrophy. Left ventricular diastolic parameters are indeterminate. Right Ventricle: The right ventricular size is normal. No increase in right ventricular wall thickness. Right ventricular systolic function is normal. There is normal pulmonary artery systolic pressure. The tricuspid regurgitant velocity is 2.52 m/s, and  with an assumed right atrial pressure of 5 mmHg, the estimated right ventricular systolic pressure is 08.6 mmHg. Left Atrium: Left atrial size was moderately dilated. Right Atrium: Right atrial size was mildly dilated. Pericardium: There is no evidence of pericardial effusion. Mitral Valve: The mitral valve is normal in structure. Normal mobility of the mitral valve leaflets. Moderate mitral valve regurgitation. No evidence of mitral valve stenosis. Tricuspid Valve: The tricuspid valve is normal in structure. Tricuspid valve regurgitation is mild . No evidence of tricuspid stenosis. Aortic Valve: The aortic valve is normal in structure. Aortic valve regurgitation is not visualized. Mild aortic valve sclerosis is present, with no evidence of aortic valve stenosis. Aortic valve mean gradient measures 2.0 mmHg. Aortic valve peak gradient measures 3.6 mmHg. Aortic valve area, by VTI measures 3.31 cm. Pulmonic Valve: The pulmonic valve was normal in structure. Pulmonic valve regurgitation is not visualized. No evidence of pulmonic stenosis. Aorta: The aortic root is normal in size and structure. Venous: The inferior vena cava is normal in size with greater than 50% respiratory variability, suggesting right atrial pressure of 3 mmHg. IAS/Shunts: No atrial level shunt detected by color flow Doppler.  LEFT VENTRICLE PLAX 2D LVIDd:         4.74 cm  Diastology LVIDs:         3.96 cm  LV e' lateral:   4.57 cm/s LV PW:         1.02 cm  LV E/e'  lateral: 19.8 LV IVS:        1.04 cm  LV e' medial:    5.00 cm/s LVOT diam:     2.00 cm  LV E/e' medial:  18.1 LV SV:         45 LV SV Index:   24 LVOT Area:     3.14 cm  RIGHT VENTRICLE RV Basal diam:  4.16 cm RV S prime:     9.14 cm/s TAPSE (M-mode): 3.4 cm LEFT ATRIUM              Index       RIGHT ATRIUM           Index LA diam:        5.10 cm  2.75 cm/m  RA Area:     20.60 cm LA Vol (A2C):   112.0 ml 60.32 ml/m RA Volume:   55.20 ml  29.73 ml/m LA Vol (A4C):   118.0 ml 63.55 ml/m  LA Biplane Vol: 115.0 ml 61.94 ml/m  AORTIC VALVE                   PULMONIC VALVE AV Area (Vmax):    2.31 cm    PV Vmax:        0.73 m/s AV Area (Vmean):   2.25 cm    PV Peak grad:   2.1 mmHg AV Area (VTI):     3.31 cm    RVOT Peak grad: 4 mmHg AV Vmax:           94.70 cm/s AV Vmean:          68.550 cm/s AV VTI:            0.136 m AV Peak Grad:      3.6 mmHg AV Mean Grad:      2.0 mmHg LVOT Vmax:         69.50 cm/s LVOT Vmean:        49.100 cm/s LVOT VTI:          0.144 m LVOT/AV VTI ratio: 1.05  AORTA Ao Root diam: 3.40 cm MITRAL VALVE               TRICUSPID VALVE MV Area (PHT): 5.54 cm    TR Peak grad:   25.4 mmHg MV Decel Time: 137 msec    TR Vmax:        252.00 cm/s MV E velocity: 90.70 cm/s MV A velocity: 46.10 cm/s  SHUNTS MV E/A ratio:  1.97        Systemic VTI:  0.14 m                            Systemic Diam: 2.00 cm Kathlyn Sacramento MD Electronically signed by Kathlyn Sacramento MD Signature Date/Time: 02/01/2020/10:09:19 AM    Final      Scheduled Meds: . sodium chloride   Intravenous Once  . aspirin EC  81 mg Oral QPM  . atorvastatin  80 mg Oral QHS  . Chlorhexidine Gluconate Cloth  6 each Topical Daily  . ferrous sulfate  325 mg Oral BID  . metoprolol tartrate  25 mg Oral BID  . midodrine  10 mg Oral TID WC  . pantoprazole  40 mg Oral Daily  . sodium bicarbonate  1,300 mg Oral TID  . sodium chloride flush  3 mL Intravenous Q12H   Continuous Infusions: . sodium chloride Stopped (01/31/20 2015)  .  amiodarone 30 mg/hr (02/02/20 0647)  . heparin 1,100 Units/hr (02/02/20 0647)  . sodium bicarbonate 150 mEq in dextrose 5% 1000 mL 150 mEq (02/02/20 0849)  . vancomycin Stopped (01/31/20 1900)     LOS: 5 days   Time Spent in minutes   45 minutes  Telsa Dillavou D.O. on 02/02/2020 at 9:49 AM  Between 7am to 7pm - Please see pager noted on [email or web address]  After 7pm go to [email or web address]  And look for the night coverage person covering for me after hours  [facility] Office  [phone]

## 2020-02-02 NOTE — Progress Notes (Signed)
Progress Note  Patient Name: Bryan Brewer Date of Encounter: 02/02/2020  Primary Cardiologist: End  Subjective   No chest pain, SOB, palpitations, or dizziness. He is for possible TEE-DCCV later today. HGB 7.0-->6.6, planning for pRBC this morning. WBC trending up to 23.1. Renal function declining. Potassium high-normal. Afebrile.   Inpatient Medications    Scheduled Meds: . sodium chloride   Intravenous Once  . aspirin EC  81 mg Oral QPM  . atorvastatin  80 mg Oral QHS  . Chlorhexidine Gluconate Cloth  6 each Topical Daily  . ferrous sulfate  325 mg Oral BID  . metoprolol tartrate  25 mg Oral BID  . midodrine  10 mg Oral TID WC  . pantoprazole  40 mg Oral Daily  . sodium bicarbonate  1,300 mg Oral TID  . sodium chloride flush  3 mL Intravenous Q12H   Continuous Infusions: . sodium chloride Stopped (01/31/20 2015)  . amiodarone 30 mg/hr (02/02/20 0503)  . heparin 1,100 Units/hr (02/02/20 0200)  . sodium bicarbonate 150 mEq in dextrose 5% 1000 mL 50 mL/hr at 02/02/20 0200  . vancomycin Stopped (01/31/20 1900)   PRN Meds: sodium chloride, acetaminophen, heparin sodium (porcine), ipratropium-albuterol, morphine injection, ondansetron **OR** ondansetron (ZOFRAN) IV, sodium chloride flush, traMADol   Vital Signs    Vitals:   02/02/20 0200 02/02/20 0300 02/02/20 0400 02/02/20 0500  BP: 105/60 100/63 109/60   Pulse: (!) 122 (!) 120 (!) 124   Resp: 12 12 11    Temp:   98 F (36.7 C)   TempSrc:      SpO2: 100% 100% 98%   Weight:    76.5 kg  Height:        Intake/Output Summary (Last 24 hours) at 02/02/2020 0623 Last data filed at 02/02/2020 0200 Gross per 24 hour  Intake 1311.51 ml  Output 450 ml  Net 861.51 ml   Filed Weights   01/31/20 0500 02/01/20 0500 02/02/20 0500  Weight: 73.7 kg 74.2 kg 76.5 kg    Telemetry    Atrial flutter with RVR, ventricular rates in the 120s bpm, rare isolated PVCs - Personally Reviewed  ECG    No new tracings - Personally  Reviewed  Physical Exam   GEN: Frail appearing; No acute distress.   Neck: No JVD. Cardiac: Tachycardic, no murmurs, rubs, or gallops.  Respiratory: Clear to auscultation bilaterally.  GI: Soft, nontender, non-distended.   MS: No edema; No deformity. Neuro:  Alert and oriented x 3; Nonfocal.  Psych: Normal affect.  Labs    Chemistry Recent Labs  Lab 01/28/20 0425 01/29/20 0255 01/31/20 0421 02/01/20 0310 02/02/20 0444  NA 123*   < > 131* 133* 133*  K 4.7   < > 4.9 5.0 5.0  CL 92*   < > 103 105 103  CO2 17*   < > 17* 16* 19*  GLUCOSE 52*   < > 151* 138* 163*  BUN 101*   < > 94* 88* 88*  CREATININE 4.06*   < > 3.48* 3.58* 3.77*  CALCIUM 7.5*   < > 7.8* 7.9* 8.1*  PROT 6.7  --   --   --   --   ALBUMIN 2.1*  --   --   --   --   AST 25  --   --   --   --   ALT 8  --   --   --   --   ALKPHOS 138*  --   --   --   --  BILITOT 1.2  --   --   --   --   GFRNONAA 13*   < > 16* 15* 14*  GFRAA 15*   < > 18* 18* 17*  ANIONGAP 14   < > 11 12 11    < > = values in this interval not displayed.     Hematology Recent Labs  Lab 01/31/20 0421 02/01/20 0310 02/02/20 0444  WBC 15.6* 16.3* 23.1*  RBC 2.71* 2.61* 2.44*  HGB 7.2* 7.0* 6.6*  HCT 22.6* 22.2* 19.8*  MCV 83.4 85.1 81.1  MCH 26.6 26.8 27.0  MCHC 31.9 31.5 33.3  RDW 15.7* 15.9* 16.3*  PLT 286 271 273    Cardiac EnzymesNo results for input(s): TROPONINI in the last 168 hours. No results for input(s): TROPIPOC in the last 168 hours.   BNPNo results for input(s): BNP, PROBNP in the last 168 hours.   DDimer No results for input(s): DDIMER in the last 168 hours.   Radiology    US Venous Img Lower Unilateral Right (DVT)  Result Date: 02/01/2020 IMPRESSION: 1. No evidence of DVT within the right lower extremity. 2. Examination is degraded secondary to indeterminate ill-defined echogenic shadowing involving the anterior medial aspect of the thigh, nonspecific though potentially secondary to subcutaneous emphysema.  Clinical correlation is advised. Further evaluation could performed with right femur radiographs or CT as indicated. Critical Value/emergent results were called by telephone at the time of interpretation on 02/01/2020 at 3:22 pm to provider [facility] , who verbally acknowledged these results. Electronically Signed   By: Sandi Mariscal M.D.   On: 02/01/2020 15:29   DG CHEST PORT 1 VIEW  Result Date: 02/01/2020 IMPRESSION: 1. Right jugular central venous catheter with the tip projecting over the cavoatrial junction. 2. Cardiomegaly with pulmonary vascular congestion. Electronically Signed   By: Kathreen Devoid   On: 02/01/2020 12:41   DG CHEST PORT 1 VIEW  Result Date: 02/01/2020 IMPRESSION: Interval placement of a right sided central venous catheter with the tip overlying the right axilla. Recommend repositioning catheter prior to use. Electronically Signed   By: Kathreen Devoid   On: 02/01/2020 12:16     Cardiac Studies   2D echo 02/01/2020: 1. Left ventricular ejection fraction, by estimation, is 35 to 40%. The  left ventricle has moderately decreased function. The left ventricle  demonstrates global hypokinesis. Left ventricular diastolic parameters are  indeterminate.  2. Right ventricular systolic function is normal. The right ventricular  size is normal. There is normal pulmonary artery systolic pressure.  3. Left atrial size was moderately dilated.  4. Right atrial size was mildly dilated.  5. The mitral valve is normal in structure. Moderate mitral valve  regurgitation. No evidence of mitral stenosis.  6. The aortic valve is normal in structure. Aortic valve regurgitation is  not visualized. Mild aortic valve sclerosis is present, with no evidence  of aortic valve stenosis.  7. The inferior vena cava is normal in size with greater than 50%  respiratory variability, suggesting right atrial pressure of 3 mmHg. __________  2D echo 03/2019: 1. The left ventricle has normal systolic  function with an ejection  fraction of 60-65%. The cavity size was normal. Left ventricular diastolic  parameters were normal.  2. The right ventricle has normal systolic function. The cavity was  normal. There is no increase in right ventricular wall thickness.  3. The aorta is normal unless otherwise noted.   Patient Profile     79 y.o. male with  history of CAD with NSTEMI in 10/2017 s/p 4-vessel CABG at [facility], SVG-diag, SVG-OM1, SVG-OM2), chronic combined systolic and diastolic CHF with subsequent normalization of LVSF by echo in 03/2019, ICM, paroxysmal SVT, AAA, DM2, CKD stage IV, anemia of chronic disease, HTN, HLD, COPD, recent mechanical fall s/p intramedullary nail placement on 01/14/2020, C. difficile colitis, GI bleed, and bladder cancer who is being seen today for the evaluation of atrial flutter with RVR.   Assessment & Plan    1. New onset atrial flutter/fib with RVR: -Overall, this is a very difficult situation given his comorbid conditions including acute on CKD stage IV, hypotension, and anemia -Our options have been very limited in managing his arrhythmia  -Plan for TEE-guided DCCV later today -NPO -Risks and benefits of DCCV have been explained in detail -Continue amiodarone and heparin gtts -Not a good candidate for digoxin given acute on CKD -Unable to add beta blockers or nondihydropyridine calcium channel blockers secondary to hypotension -Echo as above -TSH normal -Maintain potassium and magnesium goal 4.0 and 2.0 respectively  2. Acute systolic HFrEF: -He appears euvolemic -GDMT has been limited by hypotension and AKI this admission, escalate when/if able -Possibly tachy-mediated with plans for DCCV today as above -Pending clinical course and possible improvement from acute illness, consider repeat echo in several months time to reassess EF following rate/rhythm control   3. Hypotension: -Likely multifactorial including possibly sepsis with UTI,  atrial flutter/fib with RVR with IV amiodarone for rate control, volume depletion, and anemia -IV fluids -Overall, this is somewhat of a difficult situation as we are limited in rate control to IV amiodarone which is likely playing a role in his underlying hypotension -Midodrine, though suspect restoration of sinus rhythm will help the most   4. Acute left lower extremity DVT: -Recent left hip fracture s/p intramedullary nail with nonadherence to Lovenox DVT PPX  -Heparin gtt, with transition to DVT-dosed [facility] prior to discharge  -VQ scan has been deferred in the setting of acute illness, with presumed PE at this time  5.  CAD status post four-vessel CABG: -No symptoms concerning for angina -Initial high-sensitivity troponin normal, not cycled -Heparin drip with transition to oral anticoagulation prior to discharge as outlined above, at that time, would discontinue ASA to minimize bleeding risk -No plans for inpatient ischemic evaluation at this time  6.  Acute on CKD stage IV: -Renal function declining  -Dialysis catheter in place -Likely ATN in the setting of acute infection and hypotension -Monitor -Avoid nephrotoxic agents/medications  7.  Acute on chronic anemia of chronic disease: -Hemoglobin downtrending to 6.6 this morning, unable to get blood 6/28 -Planning for pRBC this morning -Management per IM  8.  Hyponatremia: -Stable   For questions or updates, please contact [place] Please consult [email or web address] for contact info under Cardiology/STEMI.    Signed, Christell Faith, PA-C [facility] Pager: [phone] 02/02/2020, 6:42 AM

## 2020-02-02 NOTE — Anesthesia Preprocedure Evaluation (Signed)
Anesthesia Evaluation  Patient identified by MRN, date of birth, ID band Patient awake    Reviewed: Allergy & Precautions, H&P , NPO status , reviewed documented beta blocker date and time   Airway Mallampati: II  TM Distance: >3 FB Neck ROM: limited    Dental  (+) Edentulous Upper, Edentulous Lower   Pulmonary COPD, former smoker,     + decreased breath sounds      Cardiovascular hypertension, + CAD, + Past MI and +CHF  Normal cardiovascular exam  03/2019 ECHO IMPRESSIONS    1. The left ventricle has normal systolic function with an ejection  fraction of 60-65%. The cavity size was normal. Left ventricular diastolic  parameters were normal.  2. The right ventricle has normal systolic function. The cavity was  normal. There is no increase in right ventricular wall thickness.  3. The aorta is normal unless otherwise noted, Nml Ao valve.   Neuro/Psych  Neuromuscular disease    GI/Hepatic neg GERD  ,  Endo/Other  diabetes  Renal/GU Renal InsufficiencyRenal disease Bladder dysfunction      Musculoskeletal  (+) Arthritis , Osteoarthritis,    Abdominal   Peds  Hematology  (+) Blood dyscrasia, anemia ,   Anesthesia Other Findings Past Medical History: No date: (HFpEF) heart failure with preserved ejection fraction ([place])     Comment:  a. 03/2019 Echo: EF 60-65%, nl RV fxn. 09/2019: AAA (abdominal aortic aneurysm) (HCC)     Comment:  3.0 infrarenal AAA incidentally noted on               abdominal/pelvic CT No date: Bladder cancer ([place]) 04/2019: C. difficile colitis No date: CKD (chronic kidney disease), stage IV (HCC) No date: COPD (chronic obstructive pulmonary disease) ([place]) No date: Coronary artery disease     Comment:  a. 10/2017 NSTEMI/Cath: LM 10m, 75d, LAD 75ost, 90p, D1               100, LCX 69m/d, OM1 90, RCA 25p; b. 10/2017 CABG x 4               ([facility]): LIMA->LAD, VG->Diag, VG->OM1, VG->OM2. No date:  Hyperlipidemia No date: Hypertension No date: NSVT (nonsustained ventricular tachycardia) (HCC)     Comment:  a. 06/2019 Zio: 2 runs of NSVT up to 12 beats, max rate               187 bpm. No date: PSVT (paroxysmal supraventricular tachycardia) ([place])     Comment:  06/2019 Zio: 1820 episodes of SVT lasting up to 00:01:16              w/ max rate of 210. No date: Type 2 diabetes mellitus ([place])  Past Surgical History: No date: COLONOSCOPY 2019: CORONARY ARTERY BYPASS GRAFT     Comment:  [facility]; LIMA-LAD, SVG-diagonal, SVG-OM1, and SVG-OM2 04/23/2019: ESOPHAGOGASTRODUODENOSCOPY; N/A     Comment:  Procedure: ESOPHAGOGASTRODUODENOSCOPY (EGD);  Surgeon:               Jonathon Bellows, MD;  Location[facility] ENDOSCOPY;  Service:               Gastroenterology;  Laterality: N/A; 04/21/2019: ESOPHAGOGASTRODUODENOSCOPY (EGD) WITH PROPOFOL; N/A     Comment:  Procedure: ESOPHAGOGASTRODUODENOSCOPY (EGD) WITH               PROPOFOL;  Surgeon: Jonathon Bellows, MD;  Location: [facility]               ENDOSCOPY;  Service: Gastroenterology;  Laterality: N/A; 10/31/2017: LEFT HEART CATH AND CORONARY ANGIOGRAPHY; N/A     Comment:  Procedure: LEFT HEART CATH AND CORONARY ANGIOGRAPHY;                Surgeon: Yolonda Kida, MD;  Location: [place]              CV LAB;  Service: Cardiovascular;  Laterality: N/A; No date: REVISION UROSTOMY CUTANEOUS  BMI    Body Mass Index: 22.55 kg/m      Reproductive/Obstetrics                             Anesthesia Physical  Anesthesia Plan  ASA: IV  Anesthesia Plan: General   Post-op Pain Management:    Induction: Intravenous  PONV Risk Score and Plan: Treatment may vary due to age or medical condition, TIVA and Propofol infusion  Airway Management Planned: Nasal Cannula  Additional Equipment:   Intra-op Plan:   Post-operative Plan:   Informed Consent: I have reviewed the patients History and Physical, chart, labs and discussed the  procedure including the risks, benefits and alternatives for the proposed anesthesia with the patient or authorized representative who has indicated his/her understanding and acceptance.     Dental Advisory Given  Plan Discussed with: CRNA  Anesthesia Plan Comments:         Anesthesia Quick Evaluation

## 2020-02-02 NOTE — Progress Notes (Signed)
ANTICOAGULATION CONSULT NOTE  Pharmacy Consult for heparin Indication: DVT  No Known Allergies  Patient Measurements: Height: 5\' 7"  (170.2 cm) Weight: 76.5 kg (168 lb 10.4 oz) IBW/kg (Calculated) : 66.1 Heparin Dosing Weight: 67 kg  Vital Signs: Temp: 98 F (36.7 C) (06/29 0400) BP: 109/60 (06/29 0400) Pulse Rate: 124 (06/29 0400)  Labs: Recent Labs    01/31/20 0421 01/31/20 0421 02/01/20 0310 02/02/20 0444  HGB 7.2*   < > 7.0* 6.6*  HCT 22.6*  --  22.2* 19.8*  PLT 286  --  271 273  HEPARINUNFRC 0.36  --  0.35 0.43  CREATININE 3.48*  --  3.58* 3.77*   < > = values in this interval not displayed.    Estimated Creatinine Clearance: 15.1 mL/min (A) (by C-G formula based on SCr of 3.77 mg/dL (H)).   Medical History: Past Medical History:  Diagnosis Date   (HFpEF) heart failure with preserved ejection fraction ([place])    a. 03/2019 Echo: EF 60-65%, nl RV fxn.   AAA (abdominal aortic aneurysm) ([place]) 09/2019   3.0 infrarenal AAA incidentally noted on abdominal/pelvic CT   Bladder cancer (HCC)    C. difficile colitis 04/2019   CHF (congestive heart failure) (HCC)    CKD (chronic kidney disease), stage IV (HCC)    COPD (chronic obstructive pulmonary disease) ([place])    Coronary artery disease    a. 10/2017 NSTEMI/Cath: LM 31m, 75d, LAD 75ost, 90p, D1 100, LCX 77m/d, OM1 90, RCA 25p; b. 10/2017 CABG x 4 ([facility]): LIMA->LAD, VG->Diag, VG->OM1, VG->OM2.   Hyperlipidemia    Hypertension    NSVT (nonsustained ventricular tachycardia) ([place])    a. 06/2019 Zio: 2 runs of NSVT up to 12 beats, max rate 187 bpm.   PSVT (paroxysmal supraventricular tachycardia) ([place])    06/2019 Zio: 1820 episodes of SVT lasting up to 00:01:16 w/ max rate of 210.   Type 2 diabetes mellitus [facility])     Assessment: 79 year old male with ultrasound left lower extremity positive for acute nonocclusive DVT within the popliteal vein. Pharmacy consulted for heparin drip.  6/25 0255 HL 0.32,  therapeutic.   6/25 1048 HL 0.42.  6/26 0449 HL 0.45, therapeutic 6/27 0421 HL 0.36, therapeutic.  CBC seems relatively stable but H/H low. 6/28 0310 HL 0.35, therapeutic.  CBC stable.  Goal of Therapy:  Heparin level 0.3-0.7 units/ml Monitor platelets by anticoagulation protocol: Yes   Plan:  06/29 @ 0400 HL 0.43 therapeutic. Will continue current rate and will recheck HL w/ am labs, CBC stable will continue to monitor.  Tobie Lords, PharmD 02/02/2020,6:06 AM

## 2020-02-03 ENCOUNTER — Encounter: Payer: Self-pay | Admitting: Internal Medicine

## 2020-02-03 LAB — TYPE AND SCREEN
ABO/RH(D): A POS
Antibody Screen: NEGATIVE
Unit division: 0

## 2020-02-03 LAB — BPAM RBC
Blood Product Expiration Date: 202107222359
ISSUE DATE / TIME: 202106290908
Unit Type and Rh: 6200

## 2020-02-03 LAB — CULTURE, BLOOD (ROUTINE X 2)
Culture: NO GROWTH
Culture: NO GROWTH
Special Requests: ADEQUATE

## 2020-02-03 MED ORDER — PROPOFOL 10 MG/ML IV BOLUS
INTRAVENOUS | Status: AC
  Filled 2020-02-03: qty 20

## 2020-02-03 MED ORDER — PHENYLEPHRINE HCL (PRESSORS) 10 MG/ML IV SOLN
INTRAVENOUS | Status: AC
  Filled 2020-02-03: qty 1

## 2020-02-03 MED ORDER — EPHEDRINE 5 MG/ML INJ
INTRAVENOUS | Status: AC
  Filled 2020-02-03: qty 10

## 2020-02-03 NOTE — Progress Notes (Signed)
[facility]  ROUNDING NOTE   Subjective:   Patient has transitioned to comfort care. Family at bedside. Patient is at peace with this decision  Objective:  Vital signs in last 24 hours:  Temp:  [97.8 F (36.6 C)] 97.8 F (36.6 C) (06/29 1926) Pulse Rate:  [129] 129 (06/29 1926) Resp:  [16] 16 (06/29 1926) BP: (110)/(59) 110/59 (06/29 1926) SpO2:  [99 %] 99 % (06/29 1926)  Weight change:  Filed Weights   01/31/20 0500 02/01/20 0500 02/02/20 0500  Weight: 73.7 kg 74.2 kg 76.5 kg    Intake/Output: I/O last 3 completed shifts: In: 1367.3 [I.V.:992.3; Blood:375] Out: 1000 [Urine:1000]   Intake/Output this shift:  No intake/output data recorded.  Physical Exam: General: NAD,   Head: Normocephalic, atraumatic. Moist oral mucosal membranes  Eyes: Anicteric, PERRL  Neck: Supple, trachea midline  Lungs:  Clear to auscultation  Heart: Regular rate and rhythm  Abdomen:  Soft, nontender,   Extremities: trace peripheral edema.  Neurologic: Nonfocal, moving all four extremities  Skin: No lesions   GU Ileostomy with foley bag. Urine is purple in color    Basic Metabolic Panel: Recent Labs  Lab 01/29/20 0255 01/29/20 0255 01/29/20 1414 01/30/20 0449 01/30/20 0449 01/31/20 0421 02/01/20 0310 02/02/20 0444  NA 127*  --   --  130*  --  131* 133* 133*  K 5.1  --   --  4.8  --  4.9 5.0 5.0  CL 98  --   --  101  --  103 105 103  CO2 17*  --   --  17*  --  17* 16* 19*  GLUCOSE 83  --   --  156*  --  151* 138* 163*  BUN 101*  --   --  95*  --  94* 88* 88*  CREATININE 3.89*  --   --  3.64*  --  3.48* 3.58* 3.77*  CALCIUM 7.3*   < >  --  7.5*   < > 7.8* 7.9* 8.1*  MG  --   --  1.8  --   --   --   --   --    < > = values in this interval not displayed.    Liver Function Tests: Recent Labs  Lab 01/28/20 0425  AST 25  ALT 8  ALKPHOS 138*  BILITOT 1.2  PROT 6.7  ALBUMIN 2.1*   No results for input(s): LIPASE, AMYLASE in the last 168 hours. No results for  input(s): AMMONIA in the last 168 hours.  CBC: Recent Labs  Lab 01/28/20 0425 01/28/20 0425 01/29/20 0255 01/30/20 0730 01/31/20 0421 02/01/20 0310 02/02/20 0444  WBC 14.5*   < > 11.0* 13.7* 15.6* 16.3* 23.1*  NEUTROABS 13.4*  --   --   --   --   --   --   HGB 8.2*   < > 7.5* 7.6* 7.2* 7.0* 6.6*  HCT 26.5*   < > 23.3* 23.9* 22.6* 22.2* 19.8*  MCV 87.7   < > 83.5 85.7 83.4 85.1 81.1  PLT 306   < > 242 258 286 271 273   < > = values in this interval not displayed.    Cardiac Enzymes: No results for input(s): CKTOTAL, CKMB, CKMBINDEX, TROPONINI in the last 168 hours.  BNP: Invalid input(s): POCBNP  CBG: Recent Labs  Lab 01/28/20 2201 01/29/20 0244 01/29/20 0558 01/29/20 0759 01/29/20 1004  GLUCAP 85 85 74 118* 189*    Microbiology: Results  for orders placed or performed during the hospital encounter of 01/28/20  SARS Coronavirus 2 by RT PCR (hospital order, performed in [facility] hospital lab) Nasopharyngeal Nasopharyngeal Swab     Status: None   Collection Time: 01/28/20  9:04 AM   Specimen: Nasopharyngeal Swab  Result Value Ref Range Status   SARS Coronavirus 2 NEGATIVE NEGATIVE Final    Comment: (NOTE) SARS-CoV-2 target nucleic acids are NOT DETECTED.  The SARS-CoV-2 RNA is generally detectable in upper and lower respiratory specimens during the acute phase of infection. The lowest concentration of SARS-CoV-2 viral copies this assay can detect is 250 copies / mL. A negative result does not preclude SARS-CoV-2 infection and should not be used as the sole basis for treatment or other patient management decisions.  A negative result may occur with improper specimen collection / handling, submission of specimen other than nasopharyngeal swab, presence of viral mutation(s) within the areas targeted by this assay, and inadequate number of viral copies (<250 copies / mL). A negative result must be combined with clinical observations, patient history, and  epidemiological information.  Fact Sheet for Patients:   StrictlyIdeas.no  Fact Sheet for Healthcare Providers: BankingDealers.co.za  This test is not yet approved or  cleared by the [place] FDA and has been authorized for detection and/or diagnosis of SARS-CoV-2 by FDA under an Emergency Use Authorization (EUA).  This EUA will remain in effect (meaning this test can be used) for the duration of the COVID-19 declaration under Section 564(b)(1) of the Act, 21 U.S.C. section 360bbb-3(b)(1), unless the authorization is terminated or revoked sooner.  Performed at [facility], [address] [place], [place] [address]   MRSA PCR Screening     Status: None   Collection Time: 01/29/20 10:10 AM   Specimen: Nasopharyngeal  Result Value Ref Range Status   MRSA by PCR NEGATIVE NEGATIVE Final    Comment:        The GeneXpert MRSA Assay (FDA approved for NASAL specimens only), is one component of a comprehensive MRSA colonization surveillance program. It is not intended to diagnose MRSA infection nor to guide or monitor treatment for MRSA infections. Performed at [facility], [address] [place], [place] [address]   CULTURE, BLOOD (ROUTINE X 2) w Reflex to ID Panel     Status: None   Collection Time: 01/29/20  2:14 PM   Specimen: BLOOD  Result Value Ref Range Status   Specimen Description BLOOD BLOOD LEFT HAND  Final   Special Requests   Final    BOTTLES DRAWN AEROBIC AND ANAEROBIC Blood Culture adequate volume   Culture   Final    NO GROWTH 5 DAYS Performed at [facility], [address] [place], [place] [address]    Report Status 02/03/2020 FINAL  Final  CULTURE, BLOOD (ROUTINE X 2) w Reflex to ID Panel     Status: None   Collection Time: 01/29/20  3:07 PM   Specimen: BLOOD  Result Value Ref Range Status   Specimen Description BLOOD BLOOD LEFT HAND  Final   Special Requests   Final     BOTTLES DRAWN AEROBIC AND ANAEROBIC Blood Culture results may not be optimal due to an excessive volume of blood received in culture bottles   Culture   Final    NO GROWTH 5 DAYS Performed at [facility], [address] [place], [place] [address]    Report Status 02/03/2020 FINAL  Final  Urine Culture  Status: Abnormal   Collection Time: 01/30/20  1:56 PM   Specimen: Urine, Clean Catch  Result Value Ref Range Status   Specimen Description   Final    URINE, CLEAN CATCH Performed at [facility], [address], [place], [place] [address]    Special Requests   Final    NONE Performed at [facility], [address] [place], [place] [address]    Culture >=100,000 COLONIES/mL ENTEROCOCCUS FAECIUM (A)  Final   Report Status 02/01/2020 FINAL  Final   Organism ID, Bacteria ENTEROCOCCUS FAECIUM (A)  Final      Susceptibility   Enterococcus faecium - MIC*    AMPICILLIN >=32 RESISTANT Resistant     NITROFURANTOIN 256 RESISTANT Resistant     VANCOMYCIN <=0.5 SENSITIVE Sensitive     * >=100,000 COLONIES/mL ENTEROCOCCUS FAECIUM    Coagulation Studies: No results for input(s): LABPROT, INR in the last 72 hours.  Urinalysis: No results for input(s): COLORURINE, LABSPEC, PHURINE, GLUCOSEU, HGBUR, BILIRUBINUR, KETONESUR, PROTEINUR, UROBILINOGEN, NITRITE, LEUKOCYTESUR in the last 72 hours.  Invalid input(s): APPERANCEUR    Imaging: CT FEMUR RIGHT WO CONTRAST  Result Date: 02/02/2020 CLINICAL DATA:  Crepitus, soft tissue gas suspected.  Elevated WBC. EXAM: CT OF THE LOWER RIGHT FEMUR WITHOUT CONTRAST CT OF THE LOWER RIGHT TIBIA/FIBULA WITHOUT CONTRAST TECHNIQUE: Multidetector CT imaging of the right femur was performed according to the standard protocol. Multidetector CT imaging of the right tibia and fibula was performed according to the standard protocol. COMPARISON:  None. FINDINGS: Bones/Joint/Cartilage Air in the pubic symphysis with bone destruction  consistent with symphyseal osteomyelitis with small amount air in the soft tissues along the anterior superficial aspect and air tracking in the pre vesicular space which is suboptimally visualized. No fracture or dislocation. Normal alignment. No joint effusion. Mild joint space narrowing of the right hip. Severe medial femorotibial compartment and lateral femorotibial compartment joint space narrowing consistent with advanced osteoarthritis. Mild patellofemoral compartment joint space narrowing. Mild osteoarthritis of the tibiotalar joint. Ligaments Ligaments are suboptimally evaluated by CT. Soft tissue, Muscles and Tendons Moderate atrophy of the semimembranosus muscle. Fatty atrophy of the rectus femoris muscle. Extensive soft tissue emphysema along the anterior aspect of the thigh extending medially along the lower thigh and knee and posterior medially into the lower leg to the level just above the ankle. Soft tissue emphysema in the rectus femoris muscle. Extensive soft tissue emphysema in the adductor musculature most severe in the adductor magnus. Additionally there is a 2.4 x 4.2 x 6.8 cm hypodense area in the adductor magnus which may reflect an abscess or necrosis. Soft tissue emphysema in the sartorius muscle. Peripheral vascular atherosclerotic disease. Extensive soft tissue edema throughout the right thigh and bilateral lower legs likely reactive. IMPRESSION: 1. Extensive soft tissue emphysema along the anterior aspect of the thigh extending medially along the lower thigh and knee and posterior medially into the lower leg to the level just above the ankle most concerning for necrotizing fasciitis. Soft tissue emphysema in the rectus femoris muscle and adductor musculature as detailed above most concerning for necrotizing myositis. 2.4 x 4.2 x 6.8 cm hypodense area in the adductor magnus which may reflect an abscess or myonecrosis. Extensive symphyseal osteomyelitis which is partially visualized as the  entire areas not included in the field of view. 2.  Tricompartmental osteoarthritis of the right knee. Critical Value/emergent results were called by telephone at the time of interpretation on 02/02/2020 at 12:17 pm to provider [facility] ,  who verbally acknowledged these results. Electronically Signed   By: Kathreen Devoid   On: 02/02/2020 12:19   CT TIBIA FIBULA RIGHT WO CONTRAST  Result Date: 02/02/2020 CLINICAL DATA:  Crepitus, soft tissue gas suspected.  Elevated WBC. EXAM: CT OF THE LOWER RIGHT FEMUR WITHOUT CONTRAST CT OF THE LOWER RIGHT TIBIA/FIBULA WITHOUT CONTRAST TECHNIQUE: Multidetector CT imaging of the right femur was performed according to the standard protocol. Multidetector CT imaging of the right tibia and fibula was performed according to the standard protocol. COMPARISON:  None. FINDINGS: Bones/Joint/Cartilage Air in the pubic symphysis with bone destruction consistent with symphyseal osteomyelitis with small amount air in the soft tissues along the anterior superficial aspect and air tracking in the pre vesicular space which is suboptimally visualized. No fracture or dislocation. Normal alignment. No joint effusion. Mild joint space narrowing of the right hip. Severe medial femorotibial compartment and lateral femorotibial compartment joint space narrowing consistent with advanced osteoarthritis. Mild patellofemoral compartment joint space narrowing. Mild osteoarthritis of the tibiotalar joint. Ligaments Ligaments are suboptimally evaluated by CT. Soft tissue, Muscles and Tendons Moderate atrophy of the semimembranosus muscle. Fatty atrophy of the rectus femoris muscle. Extensive soft tissue emphysema along the anterior aspect of the thigh extending medially along the lower thigh and knee and posterior medially into the lower leg to the level just above the ankle. Soft tissue emphysema in the rectus femoris muscle. Extensive soft tissue emphysema in the adductor musculature most severe in  the adductor magnus. Additionally there is a 2.4 x 4.2 x 6.8 cm hypodense area in the adductor magnus which may reflect an abscess or necrosis. Soft tissue emphysema in the sartorius muscle. Peripheral vascular atherosclerotic disease. Extensive soft tissue edema throughout the right thigh and bilateral lower legs likely reactive. IMPRESSION: 1. Extensive soft tissue emphysema along the anterior aspect of the thigh extending medially along the lower thigh and knee and posterior medially into the lower leg to the level just above the ankle most concerning for necrotizing fasciitis. Soft tissue emphysema in the rectus femoris muscle and adductor musculature as detailed above most concerning for necrotizing myositis. 2.4 x 4.2 x 6.8 cm hypodense area in the adductor magnus which may reflect an abscess or myonecrosis. Extensive symphyseal osteomyelitis which is partially visualized as the entire areas not included in the field of view. 2.  Tricompartmental osteoarthritis of the right knee. Critical Value/emergent results were called by telephone at the time of interpretation on 02/02/2020 at 12:17 pm to provider [facility] , who verbally acknowledged these results. Electronically Signed   By: Kathreen Devoid   On: 02/02/2020 12:19   US Venous Img Lower Unilateral Right (DVT)  Result Date: 02/01/2020 CLINICAL DATA:  Right lower extremity pain.  Evaluate for DVT. EXAM: RIGHT LOWER EXTREMITY VENOUS DOPPLER ULTRASOUND TECHNIQUE: Gray-scale sonography with graded compression, as well as color Doppler and duplex ultrasound were performed to evaluate the lower extremity deep venous systems from the level of the common femoral vein and including the common femoral, femoral, profunda femoral, popliteal and calf veins including the posterior tibial, peroneal and gastrocnemius veins when visible. The superficial great saphenous vein was also interrogated. Spectral Doppler was utilized to evaluate flow at rest and with distal  augmentation maneuvers in the common femoral, femoral and popliteal veins. COMPARISON:  None. FINDINGS: The examination is degraded due to ill-defined echogenic shadowing, presumably from subcutaneous air, partially obscuring the evaluation of the venous structures from the level of the proximal to the distal anterior thigh.  Contralateral Common Femoral Vein: Respiratory phasicity is normal and symmetric with the symptomatic side. No evidence of thrombus. Normal compressibility. Common Femoral Vein: No evidence of thrombus. Normal compressibility, respiratory phasicity and response to augmentation. Saphenofemoral Junction: No evidence of thrombus. Normal compressibility and flow on color Doppler imaging. Profunda Femoral Vein: No evidence of thrombus. Normal compressibility and flow on color Doppler imaging. Femoral Vein: No evidence of thrombus. Normal compressibility, respiratory phasicity and response to augmentation. Popliteal Vein: No evidence of thrombus. Normal compressibility, respiratory phasicity and response to augmentation. Calf Veins: No evidence of thrombus. Normal compressibility and flow on color Doppler imaging. Superficial Great Saphenous Vein: No evidence of thrombus. Normal compressibility. Venous Reflux:  None. Other Findings:  None. IMPRESSION: 1. No evidence of DVT within the right lower extremity. 2. Examination is degraded secondary to indeterminate ill-defined echogenic shadowing involving the anterior medial aspect of the thigh, nonspecific though potentially secondary to subcutaneous emphysema. Clinical correlation is advised. Further evaluation could performed with right femur radiographs or CT as indicated. Critical Value/emergent results were called by telephone at the time of interpretation on 02/01/2020 at 3:22 pm to provider [facility] , who verbally acknowledged these results. Electronically Signed   By: Sandi Mariscal M.D.   On: 02/01/2020 15:29     Medications:    .  sodium chloride   Intravenous Once   acetaminophen, antiseptic oral rinse, glycopyrrolate **OR** glycopyrrolate **OR** glycopyrrolate, haloperidol **OR** haloperidol **OR** haloperidol lactate, HYDROmorphone (DILAUDID) injection, ipratropium-albuterol, LORazepam **OR** LORazepam **OR** LORazepam, ondansetron **OR** ondansetron (ZOFRAN) IV, polyvinyl alcohol  Assessment/ Plan:  [name] is a 79 y.o. white male with diabetes mellitus type II, coronary artery disease status post CABG, COPD, history of bladder cancer with ileal conduit, left hip fracture status post ORIF earlier this month who is admitted to [facility] on 01/28/2020 for Hyponatremia [E87.1] Weakness [R53.1] Localized swelling of left lower leg [R22.42]  1. Acute renal failure on chronic kidney disease stage IV: baseline creatinine 3.19, GFR of 18 on 01/18/20.  With anion gap metabolic acidosis on nonanion gap acidosis.  No indication for dialysis today.  - discontinue dialysis catheter  2. Hyponatremia: secondary to congestive heart failure.   3. Hypotension: with atrial flutter with RVR and systolic congestive heart failure  Will sign off. Please call with questions.     LOS: 6 Randal Goens 6/30/20211:50 PM

## 2020-02-03 NOTE — Progress Notes (Signed)
PROGRESS NOTE    RASHI GIULIANI   UUV:[id number]  DOB: [birth year]  PCP: Tracie Harrier, MD    DOA: 01/28/2020 LOS: 6   Brief Narrative   Bryan Brewer is a 79 y.o. male with medical history significant for CAD, HFpEF, CKD 4, COPD, bladder cancer status post cystoprostatectomy with urostomy and ileal conduit, CAD s/p CABG who presented to the ED for evaluation of weakness and nausea.  Also concern for some blood in his urine and had been on a blood thinner since he was discharged from the hospital following an intramedullary nail placement in his left hip.  Labs revealed worsening of his renal function from baseline, his serum creatinine on discharge was 2.86 and today on admission it is 4.06, sodium is low at 123 and he has a white count of 14.5 with a hemoglobin of 8.2 that is chronic. Chest x-ray shows no acute findings and he has pyuria that appears to be chronic. Patient has swelling of his left lower extremity and venous Doppler shows positive for acute nonocclusive DVT within the popliteal vein.   He was ultimately admitted with new onset atrial fibrillation in addition to sepsis due to UTI.  Was hypotensive, requiring ICU care.  Cardiology consulted and had been planning for cardioversion.    Despite antibiotics for his UTI, WBC continued to rise and patient having complaining of right lower extremity pain on 02/01/2020 with crepitus on exam.  CT scan ordered which showed necrotizing fasciitis.  After this result, patient and family had goals of care with discussion with the attending and palliative care team.  He is on comfort care with plan to discharge to residential hospice pending an available bed.      Assessment & Plan   Principal Problem:   Hyponatremia Active Problems:   COPD (chronic obstructive pulmonary disease) (HCC)   Acute on chronic renal failure (HCC)   Sepsis secondary to UTI (HCC)   Hypotension   Presence of urostomy (HCC)   DVT (deep venous thrombosis)  (HCC)   Atypical atrial flutter (HCC)   Goals of care, counseling/discussion   Palliative care by specialist   DNR (do not resuscitate)   Necrotizing myositis ([place]an [place])   Comfort measures only status  COMFORT CARE STATUS - comfort care per orders.  Please notify MD on on call provider if any signs of uncontrolled pain or discomfort.    Prior problems this admission as follows: New onset Atrial fibrillation -Rate had noted to be up to 140s, placed on amiodarone drip as well as heparin drip.  -Cardiology consulted and appreciated.  Atrial fibrillation treatment complicated by hypotension.  Echocardiogram ordered.  TSH 1.818.   -no further labs -now off heparin, comfort care  Hypotension. - prior to comfort status, was in ICU, treated with IV fluids and midodrine.  BP's recently stable.   Sepsis secondary to UTI Present on admission, patient was noted to have leukocytosis with tachycardia. UA on admission: Showed moderate leukocytes, 21-50 WBC, no bacteria seen.  -Unfortunately urine culture was not done initially, urine culture: Enterococcus faecalis --now off antibiotics, comfort status  Acute left lower extremity DVT -Patient with recent left hip fracture status post intramedullary nail and nonadherence to Lovenox DVT prophylaxis.   Extremity Doppler positive for acute nonocclusive DVT within the popliteal vein.   No anticoagulation due to comfort status.  Treat leg pain as needed.  Necrotizing fasciitis of right lower extremity, medial thigh -patient complained of right lower extremity pain on 02/01/2020 -Ultrasound  obtained showing no evidence of DVT.  Although patient did have an indeterminate ill-defined echogenic shadowing involving the anterior medial aspect of the thigh. -On examination, patient does have palpable crepitus -Will obtain CT to evaluate further -States that the pain is better today -Continue pain control  Acute kidney injury on chronic kidney disease, stage IV  with metabolic acidosis -Baseline creatinine approximately 3.4, however on admission was 4.06 -Placed on IV fluids, stopped with comfort status -Nephrology consulted, signed off  Hyponatremia -Sodium was 123 on admission. Improved with fluids.  No further labs.  Chronic combined systolic and diastolic heart failure Appears to be euvolemic-although patient does have upper extremity edema. Echocardiogram on 03/31/2019 shows an EF of 60 to 65%.   COPD -Appears to be stable, will continue to monitor.  Comfort meds PRN for dyspnea / air hunger  Acute on chronic anemia of chronic disease - required transfusion earlier this admission.  No further labs or transfusions.  Diabetes mellitus, type II with hypoglycemic events -Last hemoglobin A1c 6.4 on 01/13/2020  Goals of care -Palliative care consulted and appreciated, patient now DNR/DNI, full comfort care.  Awaiting bed at residential hospice.  DVT prophylaxis:  None, comfort care   Diet:  Diet Orders (From admission, onward)    Start     Ordered   02/02/20 1320  Diet regular Room service appropriate? Yes; Fluid consistency: Thin  Diet effective now       Question Answer Comment  Room service appropriate? Yes   Fluid consistency: Thin      02/02/20 1319            Code Status: DNR    Subjective 02/03/20    Patient seen with wife and pastor at bedside this morning.  Patient says he feels relatively well, says "I am hanging in there".  Palliative had met with patient earlier this morning and he is wife are in agreement with comfort measures and residential hospice when bed becomes available.  Patient reports mild right leg pain at this time.  He denies other acute complaints.   Disposition Plan & Communication   Status is: Inpatient  Remains inpatient appropriate because:Inpatient level of care appropriate due to severity of illness.  Patient is comfort care status and awaiting hospice bed.   Dispo: The patient is from:  Home              Anticipated d/c is to: hospice              Anticipated d/c date is: 1 day              Patient currently is not medically stable to d/c.    Family Communication: wife at bedside during encounter    Consults, Procedures, Significant Events   Consultants:   Nephrology  Palliative care   Objective   Vitals:   02/02/20 0900 02/02/20 0915 02/02/20 0930 02/02/20 1926  BP:  108/61 109/62 (!) 110/59  Pulse: (!) 127 (!) 127  (!) 129  Resp:  15  16  Temp: 98.1 F (36.7 C) 98.1 F (36.7 C) 98.2 F (36.8 C) 97.8 F (36.6 C)  TempSrc: Oral Oral Oral Oral  SpO2: 95%  93% 99%  Weight:      Height:        Intake/Output Summary (Last 24 hours) at 02/03/2020 1515 Last data filed at 02/03/2020 0543 Gross per 24 hour  Intake --  Output 450 ml  Net -450 ml   [employer]  01/31/20 0500 02/01/20 0500 02/02/20 0500  Weight: 73.7 kg 74.2 kg 76.5 kg    Physical Exam:  General exam: awake, alert, no acute distress Respiratory system: CTAB, no wheezes, rales or rhonchi, normal respiratory effort. Cardiovascular system: normal S1/S2, RRR Gastrointestinal system: soft, NT, ND, +bowel sounds.    Labs   Data Reviewed: I have personally reviewed following labs and imaging studies  CBC: Recent Labs  Lab 01/28/20 0425 01/28/20 0425 01/29/20 0255 01/30/20 0730 01/31/20 0421 02/01/20 0310 02/02/20 0444  WBC 14.5*   < > 11.0* 13.7* 15.6* 16.3* 23.1*  NEUTROABS 13.4*  --   --   --   --   --   --   HGB 8.2*   < > 7.5* 7.6* 7.2* 7.0* 6.6*  HCT 26.5*   < > 23.3* 23.9* 22.6* 22.2* 19.8*  MCV 87.7   < > 83.5 85.7 83.4 85.1 81.1  PLT 306   < > 242 258 286 271 273   < > = values in this interval not displayed.   Basic Metabolic Panel: Recent Labs  Lab 01/29/20 0255 01/29/20 1414 01/30/20 0449 01/31/20 0421 02/01/20 0310 02/02/20 0444  NA 127*  --  130* 131* 133* 133*  K 5.1  --  4.8 4.9 5.0 5.0  CL 98  --  101 103 105 103  CO2 17*  --  17* 17* 16* 19*   GLUCOSE 83  --  156* 151* 138* 163*  BUN 101*  --  95* 94* 88* 88*  CREATININE 3.89*  --  3.64* 3.48* 3.58* 3.77*  CALCIUM 7.3*  --  7.5* 7.8* 7.9* 8.1*  MG  --  1.8  --   --   --   --    GFR: Estimated Creatinine Clearance: 15.1 mL/min (A) (by C-G formula based on SCr of 3.77 mg/dL (H)). Liver Function Tests: Recent Labs  Lab 01/28/20 0425  AST 25  ALT 8  ALKPHOS 138*  BILITOT 1.2  PROT 6.7  ALBUMIN 2.1*   No results for input(s): LIPASE, AMYLASE in the last 168 hours. No results for input(s): AMMONIA in the last 168 hours. Coagulation Profile: Recent Labs  Lab 01/28/20 2313  INR 1.5*   Cardiac Enzymes: No results for input(s): CKTOTAL, CKMB, CKMBINDEX, TROPONINI in the last 168 hours. BNP (last 3 results) No results for input(s): PROBNP in the last 8760 hours. HbA1C: No results for input(s): HGBA1C in the last 72 hours. CBG: Recent Labs  Lab 01/28/20 2201 01/29/20 0244 01/29/20 0558 01/29/20 0759 01/29/20 1004  GLUCAP 85 85 74 118* 189*   Lipid Profile: No results for input(s): CHOL, HDL, LDLCALC, TRIG, CHOLHDL, LDLDIRECT in the last 72 hours. Thyroid Function Tests: No results for input(s): TSH, T4TOTAL, FREET4, T3FREE, THYROIDAB in the last 72 hours. Anemia Panel: No results for input(s): VITAMINB12, FOLATE, FERRITIN, TIBC, IRON, RETICCTPCT in the last 72 hours. Sepsis Labs: Recent Labs  Lab 01/28/20 9476  LATICACIDVEN 1.4    Recent Results (from the past 240 hour(s))  SARS Coronavirus 2 by RT PCR (hospital order, performed in [facility] hospital lab) Nasopharyngeal Nasopharyngeal Swab     Status: None   Collection Time: 01/28/20  9:04 AM   Specimen: Nasopharyngeal Swab  Result Value Ref Range Status   SARS Coronavirus 2 NEGATIVE NEGATIVE Final    Comment: (NOTE) SARS-CoV-2 target nucleic acids are NOT DETECTED.  The SARS-CoV-2 RNA is generally detectable in upper and lower respiratory specimens during the acute phase of infection. The  lowest concentration of SARS-CoV-2 viral copies this assay can detect is 250 copies / mL. A negative result does not preclude SARS-CoV-2 infection and should not be used as the sole basis for treatment or other patient management decisions.  A negative result may occur with improper specimen collection / handling, submission of specimen other than nasopharyngeal swab, presence of viral mutation(s) within the areas targeted by this assay, and inadequate number of viral copies (<250 copies / mL). A negative result must be combined with clinical observations, patient history, and epidemiological information.  Fact Sheet for Patients:   StrictlyIdeas.no  Fact Sheet for Healthcare Providers: BankingDealers.co.za  This test is not yet approved or  cleared by the [place] FDA and has been authorized for detection and/or diagnosis of SARS-CoV-2 by FDA under an Emergency Use Authorization (EUA).  This EUA will remain in effect (meaning this test can be used) for the duration of the COVID-19 declaration under Section 564(b)(1) of the Act, 21 U.S.C. section 360bbb-3(b)(1), unless the authorization is terminated or revoked sooner.  Performed at [facility], [address] [place], [place] [address]   MRSA PCR Screening     Status: None   Collection Time: 01/29/20 10:10 AM   Specimen: Nasopharyngeal  Result Value Ref Range Status   MRSA by PCR NEGATIVE NEGATIVE Final    Comment:        The GeneXpert MRSA Assay (FDA approved for NASAL specimens only), is one component of a comprehensive MRSA colonization surveillance program. It is not intended to diagnose MRSA infection nor to guide or monitor treatment for MRSA infections. Performed at [facility], [address] [place], [place] [address]   CULTURE, BLOOD (ROUTINE X 2) w Reflex to ID Panel     Status: None   Collection Time: 01/29/20  2:14 PM   Specimen:  BLOOD  Result Value Ref Range Status   Specimen Description BLOOD BLOOD LEFT HAND  Final   Special Requests   Final    BOTTLES DRAWN AEROBIC AND ANAEROBIC Blood Culture adequate volume   Culture   Final    NO GROWTH 5 DAYS Performed at [facility], [address] [place], [place] [address]    Report Status 02/03/2020 FINAL  Final  CULTURE, BLOOD (ROUTINE X 2) w Reflex to ID Panel     Status: None   Collection Time: 01/29/20  3:07 PM   Specimen: BLOOD  Result Value Ref Range Status   Specimen Description BLOOD BLOOD LEFT HAND  Final   Special Requests   Final    BOTTLES DRAWN AEROBIC AND ANAEROBIC Blood Culture results may not be optimal due to an excessive volume of blood received in culture bottles   Culture   Final    NO GROWTH 5 DAYS Performed at [facility], [address], [place], [place] [address]    Report Status 02/03/2020 FINAL  Final  Urine Culture     Status: Abnormal   Collection Time: 01/30/20  1:56 PM   Specimen: Urine, Clean Catch  Result Value Ref Range Status   Specimen Description   Final    URINE, CLEAN CATCH Performed at [facility], [address], [place], [place] [address]    Special Requests   Final    NONE Performed at [facility], [address] [place], [place] [address]    Culture >=100,000 COLONIES/mL ENTEROCOCCUS FAECIUM (A)  Final   Report Status 02/01/2020 FINAL  Final   Organism  ID, Bacteria ENTEROCOCCUS FAECIUM (A)  Final      Susceptibility   Enterococcus faecium - MIC*    AMPICILLIN >=32 RESISTANT Resistant     NITROFURANTOIN 256 RESISTANT Resistant     VANCOMYCIN <=0.5 SENSITIVE Sensitive     * >=100,000 COLONIES/mL ENTEROCOCCUS FAECIUM      Imaging Studies   CT FEMUR RIGHT WO CONTRAST  Result Date: 02/02/2020 CLINICAL DATA:  Crepitus, soft tissue gas suspected.  Elevated WBC. EXAM: CT OF THE LOWER RIGHT FEMUR WITHOUT CONTRAST CT OF THE LOWER RIGHT TIBIA/FIBULA WITHOUT CONTRAST  TECHNIQUE: Multidetector CT imaging of the right femur was performed according to the standard protocol. Multidetector CT imaging of the right tibia and fibula was performed according to the standard protocol. COMPARISON:  None. FINDINGS: Bones/Joint/Cartilage Air in the pubic symphysis with bone destruction consistent with symphyseal osteomyelitis with small amount air in the soft tissues along the anterior superficial aspect and air tracking in the pre vesicular space which is suboptimally visualized. No fracture or dislocation. Normal alignment. No joint effusion. Mild joint space narrowing of the right hip. Severe medial femorotibial compartment and lateral femorotibial compartment joint space narrowing consistent with advanced osteoarthritis. Mild patellofemoral compartment joint space narrowing. Mild osteoarthritis of the tibiotalar joint. Ligaments Ligaments are suboptimally evaluated by CT. Soft tissue, Muscles and Tendons Moderate atrophy of the semimembranosus muscle. Fatty atrophy of the rectus femoris muscle. Extensive soft tissue emphysema along the anterior aspect of the thigh extending medially along the lower thigh and knee and posterior medially into the lower leg to the level just above the ankle. Soft tissue emphysema in the rectus femoris muscle. Extensive soft tissue emphysema in the adductor musculature most severe in the adductor magnus. Additionally there is a 2.4 x 4.2 x 6.8 cm hypodense area in the adductor magnus which may reflect an abscess or necrosis. Soft tissue emphysema in the sartorius muscle. Peripheral vascular atherosclerotic disease. Extensive soft tissue edema throughout the right thigh and bilateral lower legs likely reactive. IMPRESSION: 1. Extensive soft tissue emphysema along the anterior aspect of the thigh extending medially along the lower thigh and knee and posterior medially into the lower leg to the level just above the ankle most concerning for necrotizing fasciitis.  Soft tissue emphysema in the rectus femoris muscle and adductor musculature as detailed above most concerning for necrotizing myositis. 2.4 x 4.2 x 6.8 cm hypodense area in the adductor magnus which may reflect an abscess or myonecrosis. Extensive symphyseal osteomyelitis which is partially visualized as the entire areas not included in the field of view. 2.  Tricompartmental osteoarthritis of the right knee. Critical Value/emergent results were called by telephone at the time of interpretation on 02/02/2020 at 12:17 pm to provider [facility] , who verbally acknowledged these results. Electronically Signed   By: Kathreen Devoid   On: 02/02/2020 12:19   CT TIBIA FIBULA RIGHT WO CONTRAST  Result Date: 02/02/2020 CLINICAL DATA:  Crepitus, soft tissue gas suspected.  Elevated WBC. EXAM: CT OF THE LOWER RIGHT FEMUR WITHOUT CONTRAST CT OF THE LOWER RIGHT TIBIA/FIBULA WITHOUT CONTRAST TECHNIQUE: Multidetector CT imaging of the right femur was performed according to the standard protocol. Multidetector CT imaging of the right tibia and fibula was performed according to the standard protocol. COMPARISON:  None. FINDINGS: Bones/Joint/Cartilage Air in the pubic symphysis with bone destruction consistent with symphyseal osteomyelitis with small amount air in the soft tissues along the anterior superficial aspect and air tracking in the pre vesicular space which is suboptimally  visualized. No fracture or dislocation. Normal alignment. No joint effusion. Mild joint space narrowing of the right hip. Severe medial femorotibial compartment and lateral femorotibial compartment joint space narrowing consistent with advanced osteoarthritis. Mild patellofemoral compartment joint space narrowing. Mild osteoarthritis of the tibiotalar joint. Ligaments Ligaments are suboptimally evaluated by CT. Soft tissue, Muscles and Tendons Moderate atrophy of the semimembranosus muscle. Fatty atrophy of the rectus femoris muscle. Extensive soft  tissue emphysema along the anterior aspect of the thigh extending medially along the lower thigh and knee and posterior medially into the lower leg to the level just above the ankle. Soft tissue emphysema in the rectus femoris muscle. Extensive soft tissue emphysema in the adductor musculature most severe in the adductor magnus. Additionally there is a 2.4 x 4.2 x 6.8 cm hypodense area in the adductor magnus which may reflect an abscess or necrosis. Soft tissue emphysema in the sartorius muscle. Peripheral vascular atherosclerotic disease. Extensive soft tissue edema throughout the right thigh and bilateral lower legs likely reactive. IMPRESSION: 1. Extensive soft tissue emphysema along the anterior aspect of the thigh extending medially along the lower thigh and knee and posterior medially into the lower leg to the level just above the ankle most concerning for necrotizing fasciitis. Soft tissue emphysema in the rectus femoris muscle and adductor musculature as detailed above most concerning for necrotizing myositis. 2.4 x 4.2 x 6.8 cm hypodense area in the adductor magnus which may reflect an abscess or myonecrosis. Extensive symphyseal osteomyelitis which is partially visualized as the entire areas not included in the field of view. 2.  Tricompartmental osteoarthritis of the right knee. Critical Value/emergent results were called by telephone at the time of interpretation on 02/02/2020 at 12:17 pm to provider [facility] , who verbally acknowledged these results. Electronically Signed   By: Kathreen Devoid   On: 02/02/2020 12:19     Medications   Scheduled Meds: . sodium chloride   Intravenous Once   Continuous Infusions:     LOS: 6 days    Time spent: 20 minutes    Ezekiel Slocumb, DO [facility]  02/03/2020, 3:15 PM    If 7PM-7AM, please contact night-coverage. How to contact the [facility] Attending or Consulting provider [place] or covering provider during after hours [place], for  this patient?    1. Check the care team in [facility] and look for a) attending/consulting [facility] provider listed and b) the [facility] team listed 2. Log into [email or web address] and use [facility]'s universal password to access. If you do not have the password, please contact the hospital operator. 3. Locate the [facility] provider you are looking for under [facility] and page to a number that you can be directly reached. 4. If you still have difficulty reaching the provider, please page the [facility] (Director on Call) for the Hospitalists listed on amion for assistance.

## 2020-02-03 NOTE — Progress Notes (Addendum)
[facility]) Hospital Liaison RN note:  Received request from Kathie Rhodes, NP for family interest in [place]. Chart reviewed and eligibility has been approved. Doran Clay, TOC is aware.  Spoke with patient, Bryan Brewer and his spouse Bryan Brewer in the room to acknowledge referral and provide education on hospice philosophy and services provided at the home.  Unfortunately,  Hospice Home is not able to offer a room today. RN Liaison will follow up tomorrow or sooner if room becomes available.   Please call for any hospice related questions or concerns.  Thank you for the opportunity to participate in this patient's care.  Zandra Abts, RN [facility] Liaison [phone]

## 2020-02-03 NOTE — Progress Notes (Signed)
Bryan Brewer visited pt. on 1C as follow-up from visits on the ICU earlier this week; pt. remains in good spirits, verbalizing understanding of the seriousness of his health issues and of the plan to pursue comfort care and hospice, but also speaking confidently of his hope that God is going to 'pull me out of this' so that he can return to his own home.  Pt.'s wife Bryan Brewer seems to be having trouble processing the situation; she shared sense of confusion and concern re: source of infection in pt.'s leg; wife participated in conversations w/[facility] and friend Bryan Brewer in rm. but she would periodically seem to remember pt.'s illness and become tearful.  Pt. has decided to opt for residential hospice and seems at peace w/this decision. Today is pt. and wife's 59th anniversary, and [facility] offered congratulations via a card.  Pt. scheduled to discharge whenever bed is available w/hospice.  No further needs at this time; [facility] may follow up tomorrow if possible.    02/03/20 1445  Clinical Encounter Type  Visited With Patient and family together  Visit Type Psychological support;Follow-up;Spiritual support;Social support  Referral From Family;Palliative care team  Spiritual Encounters  Spiritual Needs Emotional;Grief support  Stress Factors  Patient Stress Factors Major life changes;Health changes  Family Stress Factors Health changes;Major life changes

## 2020-02-03 NOTE — Progress Notes (Signed)
Daily Progress Note   Patient Name: Bryan Brewer       Date: 02/03/2020 DOB: Feb 02, 1941  Age: 79 y.o. MRN#: [id number] Attending Physician: Ezekiel Slocumb, DO Primary Care Physician: Tracie Harrier, MD Admit Date: 01/28/2020  Reason for Consultation/Follow-up: Establishing goals of care, Hospice Evaluation, Inpatient hospice referral, Non pain symptom management, Pain control, Psychosocial/spiritual support and Terminal Care  Subjective: Sitting up eating, denies pain. Legs tender to touch.   Length of Stay: 6  Current Medications: Scheduled Meds:   sodium chloride   Intravenous Once    Continuous Infusions:   PRN Meds: acetaminophen, antiseptic oral rinse, glycopyrrolate **OR** glycopyrrolate **OR** glycopyrrolate, haloperidol **OR** haloperidol **OR** haloperidol lactate, HYDROmorphone (DILAUDID) injection, ipratropium-albuterol, LORazepam **OR** LORazepam **OR** LORazepam, ondansetron **OR** ondansetron (ZOFRAN) IV, polyvinyl alcohol  Physical Exam Constitutional:      General: He is not in acute distress. Pulmonary:     Effort: Pulmonary effort is normal.  Musculoskeletal:     Right lower leg: Edema present.     Left lower leg: Edema present.     Comments: L leg warm and tender to touch, erythematous   Skin:    General: Skin is warm and dry.  Neurological:     Mental Status: He is alert and oriented to person, place, and time.  Psychiatric:     Comments: tearful             Vital Signs: BP (!) 110/59 (BP Location: Right Arm)    Pulse (!) 129    Temp 97.8 F (36.6 C) (Oral)    Resp 16    Ht 5\' 7"  (1.702 m)    Wt 76.5 kg    SpO2 99%    BMI 26.41 kg/m  SpO2: SpO2: 99 % O2 Device: O2 Device: Room Air O2 Flow Rate: O2 Flow Rate (L/min): 2 L/min  Intake/output summary:    Intake/Output Summary (Last 24 hours) at 02/03/2020 1328 Last data filed at 02/03/2020 0543 Gross per 24 hour  Intake --  Output 450 ml  Net -450 ml   LBM: Last BM Date: 01/31/20 Baseline Weight: Weight: 67.6 kg Most recent weight: Weight: 76.5 kg       Palliative Assessment/Data: PPS 40%    Flowsheet Rows     Most Recent Value  Intake Tab  Referral Department Critical care  Unit at Time of Referral ICU  Palliative Care Primary Diagnosis Sepsis/Infectious Disease  Date Notified 02/01/20  Palliative Care Type New Palliative care  Reason for referral Clarify Goals of Care  Date of Admission 01/29/20  Date first seen by Palliative Care 02/01/20  # of days Palliative referral response time 0 Day(s)  # of days IP prior to Palliative referral 3  Clinical Assessment  Palliative Performance Scale Score 40%  Psychosocial & Spiritual Assessment  Palliative Care Outcomes  Patient/Family meeting held? Yes  Who was at the meeting? patient, spouse, cousin  Palliative Care Outcomes Improved pain interventions, Improved non-pain symptom therapy, Clarified goals of care, Counseled regarding hospice, Provided end of life care assistance, Provided psychosocial or spiritual support, Changed to focus on comfort, Changed CPR status      Patient Active Problem List   Diagnosis Date  Noted   Necrotizing myositis ([place])    Comfort measures only status    Goals of care, counseling/discussion    Palliative care by specialist    DNR (do not resuscitate)    Atypical atrial flutter ([place])    DVT (deep venous thrombosis) ([place]) 01/28/2020   Presence of urostomy ([place]) 01/14/2020   Closed left hip fracture, initial encounter ([place]) 01/13/2020   Accidental fall 01/13/2020   Preoperative clearance 01/13/2020   Hyperkalemia 12/17/2019   Abdominal pain 09/18/2019   Iron deficiency anemia 07/14/2019   PSVT (paroxysmal supraventricular tachycardia) ([place]) 06/27/2019   Aortic  atherosclerosis ([place]) 06/16/2019   Paroxysmal atrial fibrillation ([place]) 06/15/2019   Bladder cancer ([place]) 06/02/2019   BPH (benign prostatic hyperplasia) 06/02/2019   Anemia 05/21/2019   Chronic heart failure with preserved ejection fraction (HFpEF) ([place]) 05/14/2019   Coronary artery disease involving native coronary artery of native heart without angina pectoris 05/14/2019   Cellulitis of lower extremity 05/14/2019   Skin ulcer of calf, limited to breakdown of skin ([place]) 05/14/2019   Hypoglycemia 04/29/2019   Hypotension 04/29/2019   Tachycardia    GIB (gastrointestinal bleeding) 04/20/2019   Pressure injury of skin 03/31/2019   HCAP (healthcare-associated pneumonia) 03/29/2019   UTI (urinary tract infection) 03/29/2019   Sepsis secondary to UTI ([place]) 03/29/2019   Hyponatremia 03/29/2019   Acute on chronic renal failure ([place]) 02/14/2019   Osteoarthritis of knee 86/76/7209   Chronic systolic CHF (congestive heart failure) ([place]) 11/20/2018   Primary osteoarthritis involving multiple joints 08/02/2018   Presence of aortocoronary bypass graft 11/08/2017   NSTEMI (non-ST elevated myocardial infarction) ([place]) 10/30/2017   Diabetes ([place]) 10/30/2017   CKD (chronic kidney disease) stage 4, GFR 15-29 ml/min (HCC) 10/30/2017   HTN (hypertension) 10/30/2017   HLD (hyperlipidemia) 10/30/2017   COPD (chronic obstructive pulmonary disease) ([place]) 10/30/2017   Spinal stenosis of lumbar region 08/20/2017   Lumbar radiculopathy 08/09/2017   Hx of bladder cancer 04/30/2016   Pure hypercholesterolemia 09/08/2015   Simple chronic bronchitis ([place]) 09/08/2015   Hypomagnesemia 03/30/2015   Benign essential hypertension 03/30/2015   Abdominal wall hernia at previous stoma site 01/12/2013   Diarrhea in adult patient 01/12/2013    Palliative Care Assessment & Plan   HPI: 79 y.o.malewith past medical history of CAD s/p CABG, HF, CKD 4, COPD, T2DM, and bladder  cancer s/p cystoprostatectomy with urostomy and ileal conduitadmitted on 6/24/2021with weakness and nausea.Patient recently discharged earlier this month following intramedullary nail placement in left hip. On admission found to have a fib RVR. Labs reveal worsening renal function, anemia, and hyponatremia. Venous doppler revealed left lower extremity DVT. Patient has had hypotension throughout hospitalizations. Also being treated for urosepsis. Echo 6/28 with EF 35-40%. PMT consulted for [place].  6/29: Crepitus noted in right leg by Dr. Ree Kida this morning, with subsequent CT showing extensive soft tissue gas and possible abscess/necrosis of the rectus femoris and adductor musculature concerning for necrotizing myositis.  Cardioversion cancelled on 6/29 after CT results obtained. Patient unlikely to survive RLE infection without surgery and is a poor surgical candidate. Recommendations have been made for full comfort care.   Assessment: Follow up with patient and family today.  We again discuss situation. Wife tells me how worried she is about patient's leg - we discuss that it is a serious, life threatening condition. Again discussed that recommendation has been made to not pursue surgery d/t patient's multiorgan failure. We again discussed that continuing aggressive interventions are unlikely to change ultimate outcome.  Wife tells me about their faith and how they are hopeful for total healing. We explored what she meant by that. She does express an understanding of patient's severe illness and limited medical interventions but continues to hope for healing from God. Patient and wife are both agreeable to focus on comfort as they both express understanding that patient is not a good surgical candidate and aggressive interventions are unlikely to benefit patient.  Patient expresses a desire to go home; however, wife fears she will not be able to provide the level of care he needs. We discuss  the option of the hospice facility. We discuss philosophy of hospice and focusing on comfort and relief of suffering. They are agreeable to referral to hospice home.  Patient reports symptoms are currently well controlled. We discussed symptom management plan.   Recommendations/Plan:  Continue full comfort measures with PRN medications  Referral to hospice facility - discussed with case manager and hospice liaison  Goals of Care and Additional Recommendations:  Limitations on Scope of Treatment: Full Comfort Care  Code Status:  DNR  Discharge Planning:  Hospice facility  Care plan was discussed with patient, wife, RN, Dr. Arbutus Ped, case manager, hospice liaison  Thank you for allowing the Palliative Medicine Team to assist in the care of this patient.   Total Time 25 minutes Prolonged Time Billed  no       Greater than 50%  of this time was spent counseling and coordinating care related to the above assessment and plan.  Juel Burrow, DNP, [facility] Palliative Medicine Team Team Phone # [phone]  Pager [phone]

## 2020-02-03 NOTE — Hospital Course (Addendum)
Bryan Brewer is a 79 y.o. male with medical history significant for CAD, HFpEF, CKD 4, COPD, bladder cancer status post cystoprostatectomy with urostomy and ileal conduit, CAD s/p CABG who presented to the ED for evaluation of weakness and nausea.  Also concern for some blood in his urine and had been on a blood thinner since he was discharged from the hospital following an intramedullary nail placement in his left hip.  Labs revealed worsening of his renal function from baseline, his serum creatinine on discharge was 2.86 and today on admission it is 4.06, sodium is low at 123 and he has a white count of 14.5 with a hemoglobin of 8.2 that is chronic. Chest x-ray shows no acute findings and he has pyuria that appears to be chronic. Patient has swelling of his left lower extremity and venous Doppler shows positive for acute nonocclusive DVT within the popliteal vein.   He was ultimately admitted with new onset atrial fibrillation with RVR in addition to sepsis due to UTI.  He was initially hypotensive, requiring ICU care.  Cardiology was consulted and had been planning for cardioversion.  However, despite antibiotics for his UTI, WBC continued to rise and patient having complaining of right lower extremity pain on 02/01/2020 with crepitus on exam.  CT scan ordered which showed necrotizing fasciitis, myositis and symphyseal osteomyelitis.  After this result, patient and family had goals of care with discussion with the attending and palliative care team, given surgery would be required and patient not a candidate for surgery due to his cardiac and respiratory issues.  Patient and family decided on comfort care measures and discharge to residential hospice.  Patient is stable for discharge to hospice today.

## 2020-02-03 NOTE — TOC Transition Note (Signed)
Transition of Care [facility]) - CM/SW Discharge Note   Patient Details  Name: Bryan Brewer MRN: [id number] Date of Birth: [birth year]  [facility] CM/SW Contact:  Shelbie Hutching, RN Phone Number: 02/03/2020, 12:36 PM   Clinical Narrative:    Patient and family have agreed on residential hospice.  Referral given to Flo Shanks with [facility] by [facility] with Palliative Care.       Barriers to Discharge: Hospice Bed not available   Patient Goals and CMS Choice Patient states their goals for this hospitalization and ongoing recovery are:: Patient wants to be comfortable CMS Medicare.gov Compare Post Acute Care list provided to:: Patient Choice offered to / list presented to : Patient, Spouse  Discharge Placement                       Discharge Plan and Services     Post Acute Care Choice: Hospice                               Social Determinants of Health (SDOH) Interventions     Readmission Risk Interventions Readmission Risk Prevention Plan 01/15/2020  Transportation Screening Complete  Medication Review Press photographer) Complete  PCP or Specialist appointment within 3-5 days of discharge Complete  HRI or Home Care Consult Complete  [place] Not Applicable  Some recent data might be hidden

## 2020-02-04 LAB — PREPARE RBC (CROSSMATCH)

## 2020-02-04 NOTE — Progress Notes (Addendum)
[facility]) Hospital Liaison RN note:  Spoke with patient and spouse, Stanton Kidney, in the room, to them know that [facility] will have a bed tomorrow for Mr. Finkler. Plan would be for patient pick up around 1 pm.  Doran Clay, RN TOC aware. Registration paper work to be completed by Stanton Kidney today at 3pm at the [facility].  Please call for any hospice related questions or concerns and thank you for the opportunity to participate in this patient's care.  Zandra Abts, RN [facility] Liaison [phone]

## 2020-02-04 NOTE — Progress Notes (Signed)
Daily Progress Note   Patient Name: Bryan Brewer       Date: 02/04/2020 DOB: July 29, 1941  Age: 79 y.o. MRN#: [id number] Attending Physician: Ezekiel Slocumb, DO Primary Care Physician: Tracie Harrier, MD Admit Date: 01/28/2020  Reason for Consultation/Follow-up: Establishing goals of care  Subjective: Patient a bit tearful this morning. Wife at bedside. Denies pain or any discomfort.   Length of Stay: 7  Current Medications: Scheduled Meds:  . sodium chloride   Intravenous Once    Continuous Infusions:   PRN Meds: acetaminophen, antiseptic oral rinse, glycopyrrolate **OR** glycopyrrolate **OR** glycopyrrolate, haloperidol **OR** haloperidol **OR** haloperidol lactate, HYDROmorphone (DILAUDID) injection, ipratropium-albuterol, LORazepam **OR** LORazepam **OR** LORazepam, ondansetron **OR** ondansetron (ZOFRAN) IV, polyvinyl alcohol  Physical Exam Constitutional:      General: He is not in acute distress. Cardiovascular:     Rate and Rhythm: Tachycardia present.  Pulmonary:     Effort: Pulmonary effort is normal.  Skin:    General: Skin is warm and dry.  Neurological:     Mental Status: He is alert and oriented to person, place, and time.  Psychiatric:     Comments: tearful             Vital Signs: BP (!) 103/56 (BP Location: Right Arm)   Pulse (!) 139   Temp 98.2 F (36.8 C)   Resp 16   Ht 5\' 7"  (1.702 m)   Wt 76.5 kg   SpO2 100%   BMI 26.41 kg/m  SpO2: SpO2: 100 % O2 Device: O2 Device: Room Air O2 Flow Rate: O2 Flow Rate (L/min): 2 L/min  Intake/output summary:   Intake/Output Summary (Last 24 hours) at 02/04/2020 1121 Last data filed at 02/04/2020 0455 Gross per 24 hour  Intake --  Output 1000 ml  Net -1000 ml   LBM: Last BM Date: 02/01/20 Baseline Weight: Weight:  67.6 kg Most recent weight: Weight: 76.5 kg       Palliative Assessment/Data: PPS 40%    Flowsheet Rows     Most Recent Value  Intake Tab  Referral Department Critical care  Unit at Time of Referral ICU  Palliative Care Primary Diagnosis Sepsis/Infectious Disease  Date Notified 02/01/20  Palliative Care Type New Palliative care  Reason for referral Clarify Goals of Care  Date of Admission 01/29/20  Date first seen by Palliative Care 02/01/20  # of days Palliative referral response time 0 Day(s)  # of days IP prior to Palliative referral 3  Clinical Assessment  Palliative Performance Scale Score 40%  Psychosocial & Spiritual Assessment  Palliative Care Outcomes  Patient/Family meeting held? Yes  Who was at the meeting? patient, spouse, cousin  Palliative Care Outcomes Improved pain interventions, Improved non-pain symptom therapy, Clarified goals of care, Counseled regarding hospice, Provided end of life care assistance, Provided psychosocial or spiritual support, Changed to focus on comfort, Changed CPR status      Patient Active Problem List   Diagnosis Date Noted  . Necrotizing myositis ([place])   . Comfort measures only status   . Goals of care, counseling/discussion   . Palliative care by specialist   . DNR (do not resuscitate)   . Atypical atrial flutter ([place])   .  DVT (deep venous thrombosis) ([place]) 01/28/2020  . Presence of urostomy ([place]) 01/14/2020  . Closed left hip fracture, initial encounter ([place]) 01/13/2020  . Accidental fall 01/13/2020  . Preoperative clearance 01/13/2020  . Hyperkalemia 12/17/2019  . Abdominal pain 09/18/2019  . Iron deficiency anemia 07/14/2019  . PSVT (paroxysmal supraventricular tachycardia) ([place]) 06/27/2019  . Aortic atherosclerosis ([place]) 06/16/2019  . Paroxysmal atrial fibrillation ([place]) 06/15/2019  . Bladder cancer ([place]) 06/02/2019  . BPH (benign prostatic hyperplasia) 06/02/2019  . Anemia 05/21/2019  . Chronic heart failure with  preserved ejection fraction (HFpEF) ([place]) 05/14/2019  . Coronary artery disease involving native coronary artery of native heart without angina pectoris 05/14/2019  . Cellulitis of lower extremity 05/14/2019  . Skin ulcer of calf, limited to breakdown of skin ([place]) 05/14/2019  . Hypoglycemia 04/29/2019  . Hypotension 04/29/2019  . Tachycardia   . GIB (gastrointestinal bleeding) 04/20/2019  . Pressure injury of skin 03/31/2019  . HCAP (healthcare-associated pneumonia) 03/29/2019  . UTI (urinary tract infection) 03/29/2019  . Sepsis secondary to UTI ([place]) 03/29/2019  . Hyponatremia 03/29/2019  . Acute on chronic renal failure ([place]) 02/14/2019  . Osteoarthritis of knee 01/06/2019  . Chronic systolic CHF (congestive heart failure) ([place]) 11/20/2018  . Primary osteoarthritis involving multiple joints 08/02/2018  . Presence of aortocoronary bypass graft 11/08/2017  . NSTEMI (non-ST elevated myocardial infarction) ([place]) 10/30/2017  . Diabetes ([place]) 10/30/2017  . CKD (chronic kidney disease) stage 4, GFR 15-29 ml/min (HCC) 10/30/2017  . HTN (hypertension) 10/30/2017  . HLD (hyperlipidemia) 10/30/2017  . COPD (chronic obstructive pulmonary disease) ([place]) 10/30/2017  . Spinal stenosis of lumbar region 08/20/2017  . Lumbar radiculopathy 08/09/2017  . Hx of bladder cancer 04/30/2016  . Pure hypercholesterolemia 09/08/2015  . Simple chronic bronchitis ([place]) 09/08/2015  . Hypomagnesemia 03/30/2015  . Benign essential hypertension 03/30/2015  . Abdominal wall hernia at previous stoma site 01/12/2013  . Diarrhea in adult patient 01/12/2013    Palliative Care Assessment & Plan   HPI: 79 y.o.malewith past medical history of CAD s/p CABG, HF, CKD 4, COPD, T2DM, and bladder cancer s/p cystoprostatectomy with urostomy and ileal conduitadmitted on 6/24/2021with weakness and nausea.Patient recently discharged earlier this month following intramedullary nail placement in left hip. On admission  found to have a fib RVR. Labs reveal worsening renal function, anemia, and hyponatremia. Venous doppler revealed left lower extremity DVT. Patient has had hypotension throughout hospitalizations. Also being treated for urosepsis. Echo 6/28 with EF 35-40%. PMT consulted for [place].  6/29:Crepitus noted in right leg by Dr. Ree Kida this morning, with subsequent CT showing extensive soft tissue gas and possible abscess/necrosis of the rectus femoris and adductor musculature concerning for necrotizing myositis.  Cardioversion cancelled on 6/29 after CT results obtained. Patient unlikely to survive RLE infection without surgery and is a poor surgical candidate. Recommendations have been made for full comfort care.  Assessment: Follow up today with patient and wife. Patient denies pain or discomfort currently. Received a dose of dilaudid overnight and reports it was helpful. We again discuss goals of care - comfort measures only and transition to hospice facility whenever bed is available. Patient and wife are agreeable. They ask if vital signs can be taken - we discuss that they certainly can be if this gives them comfort; however, the results would not change plan of care as we expect patient to decline d/t his medical diagnosis and limited interventions available. They express understanding. All questions and concerns addressed.   Recommendations/Plan:  Continue comfort measures  Hospice home - potentially 7/2?  Goals of Care and Additional Recommendations:  Limitations on Scope of Treatment: Full Comfort Care  Code Status:  DNR  Discharge Planning:  Hospice facility  Care plan was discussed with patient, wife, RN  Thank you for allowing the Palliative Medicine Team to assist in the care of this patient.   Total Time 15 minutes Prolonged Time Billed  no       Greater than 50%  of this time was spent counseling and coordinating care related to the above assessment and plan.  Juel Burrow, DNP, [facility] Palliative Medicine Team Team Phone # [phone]  Pager [phone]

## 2020-02-04 NOTE — Care Management Important Message (Deleted)
Important Message  Patient Details  Name: Bryan Brewer MRN: [id number] Date of Birth: May 08, 1941   Medicare Important Message Given:  N/A - LOS <3 / Initial given by admissions     Juliann Pulse A Druscilla Petsch 02/04/2020, 9:04 AM

## 2020-02-04 NOTE — Progress Notes (Signed)
PROGRESS NOTE    Bryan Brewer   JGG:[id number]  DOB: Nov 06, 1940  PCP: Tracie Harrier, MD    DOA: 01/28/2020 LOS: 7   Brief Narrative   Bryan Brewer is a 79 y.o. male with medical history significant for CAD, HFpEF, CKD 4, COPD, bladder cancer status post cystoprostatectomy with urostomy and ileal conduit, CAD s/p CABG who presented to the ED for evaluation of weakness and nausea.  Also concern for some blood in his urine and had been on a blood thinner since he was discharged from the hospital following an intramedullary nail placement in his left hip.  Labs revealed worsening of his renal function from baseline, his serum creatinine on discharge was 2.86 and today on admission it is 4.06, sodium is low at 123 and he has a white count of 14.5 with a hemoglobin of 8.2 that is chronic. Chest x-ray shows no acute findings and he has pyuria that appears to be chronic. Patient has swelling of his left lower extremity and venous Doppler shows positive for acute nonocclusive DVT within the popliteal vein.   He was ultimately admitted with new onset atrial fibrillation in addition to sepsis due to UTI.  Was hypotensive, requiring ICU care.  Cardiology consulted and had been planning for cardioversion.    Despite antibiotics for his UTI, WBC continued to rise and patient having complaining of right lower extremity pain on 02/01/2020 with crepitus on exam.  CT scan ordered which showed necrotizing fasciitis, myositis and symphyseal osteomyelitis.  After this result, patient and family had goals of care with discussion with the attending and palliative care team, given surgery would be required and patient not a candidate for surgery due to his cardiac and respiratory issues.  He is now on comfort care with plan to discharge to residential hospice pending an available bed.      Assessment & Plan   Principal Problem:   Hyponatremia Active Problems:   COPD (chronic obstructive pulmonary disease)  (HCC)   Acute on chronic renal failure (HCC)   Sepsis secondary to UTI (HCC)   Hypotension   Presence of urostomy (HCC)   DVT (deep venous thrombosis) (HCC)   Atypical atrial flutter (HCC)   Goals of care, counseling/discussion   Palliative care by specialist   DNR (do not resuscitate)   Necrotizing myositis ([place])   Comfort measures only status  COMFORT CARE STATUS - comfort care per orders.  Please notify MD on on call provider if any signs of uncontrolled pain or discomfort.    Prior problems this admission as follows: New onset Atrial fibrillation -Rate had noted to be up to 140s, placed on amiodarone drip as well as heparin drip.  -Cardiology consulted and appreciated.  Atrial fibrillation treatment complicated by hypotension.  Echocardiogram ordered.  TSH 1.818.   -no further labs -now off heparin, comfort care  Hypotension. - prior to comfort status, was in ICU, treated with IV fluids and midodrine.  BP's recently stable.   Sepsis secondary to UTI Present on admission, patient was noted to have leukocytosis with tachycardia. UA on admission: Showed moderate leukocytes, 21-50 WBC, no bacteria seen.  -Unfortunately urine culture was not done initially, urine culture: Enterococcus faecalis --now off antibiotics, comfort status  Acute left lower extremity DVT -Patient with recent left hip fracture status post intramedullary nail and nonadherence to Lovenox DVT prophylaxis.   Extremity Doppler positive for acute nonocclusive DVT within the popliteal vein.   No anticoagulation due to comfort status.  Treat leg pain as needed.  Necrotizing fasciitis of right lower extremity, medial thigh -patient complained of right lower extremity pain on 02/01/2020 -Ultrasound obtained showing no evidence of DVT.  Although patient did have an indeterminate ill-defined echogenic shadowing involving the anterior medial aspect of the thigh. -On examination, patient does have palpable crepitus -Will  obtain CT to evaluate further -States that the pain is better today -Continue pain control  Acute kidney injury on chronic kidney disease, stage IV with metabolic acidosis -Baseline creatinine approximately 3.4, however on admission was 4.06 -Placed on IV fluids, stopped with comfort status -Nephrology consulted, signed off  Hyponatremia -Sodium was 123 on admission. Improved with fluids.  No further labs.  Chronic combined systolic and diastolic heart failure Appears to be euvolemic-although patient does have upper extremity edema. Echocardiogram on 03/31/2019 shows an EF of 60 to 65%.   COPD -Appears to be stable, will continue to monitor.  Comfort meds PRN for dyspnea / air hunger  Acute on chronic anemia of chronic disease - required transfusion earlier this admission.  No further labs or transfusions.  Diabetes mellitus, type II with hypoglycemic events -Last hemoglobin A1c 6.4 on 01/13/2020  Goals of care -Palliative care consulted and appreciated, patient now DNR/DNI, full comfort care.  Awaiting bed at residential hospice.  DVT prophylaxis:  None, comfort care   Diet:  Diet Orders (From admission, onward)    Start     Ordered   02/02/20 1320  Diet regular Room service appropriate? Yes; Fluid consistency: Thin  Diet effective now       Question Answer Comment  Room service appropriate? Yes   Fluid consistency: Thin      02/02/20 1319            Code Status: DNR    Subjective 02/04/20    Patient seen with wife and pastor at bedside this morning.  Patient denies pain or discomfort.  Did get Dilaudid for pain overnight, says it helped.  Denies fevers or chills, CP or SOB.  Patient and wife continue in agreement with comfort care and hospice.  They continue to pray for a miracle.  Disposition Plan & Communication   Status is: Inpatient  Remains inpatient appropriate because:Inpatient level of care appropriate due to severity of illness.  Patient is comfort  care status and awaiting hospice bed.   Dispo: The patient is from: Home              Anticipated d/c is to: hospice              Anticipated d/c date is: 1 day              Patient currently is not medically stable to d/c.    Family Communication: wife at bedside during encounter    Consults, Procedures, Significant Events   Consultants:   Nephrology  Palliative care   Objective   Vitals:   02/02/20 0930 02/02/20 1926 02/04/20 0453 02/04/20 0755  BP: 109/62 (!) 110/59 (!) 109/54 (!) 103/56  Pulse:  (!) 129 (!) 137 (!) 139  Resp:  16 16 16   Temp: 98.2 F (36.8 C) 97.8 F (36.6 C) 98.5 F (36.9 C) 98.2 F (36.8 C)  TempSrc: Oral Oral Oral   SpO2: 93% 99% 100% 100%  Weight:      Height:        Intake/Output Summary (Last 24 hours) at 02/04/2020 1430 Last data filed at 02/04/2020 0455 Gross per 24 hour  Intake --  Output 1000 ml  Net -1000 ml   Filed Weights   01/31/20 0500 02/01/20 0500 02/02/20 0500  Weight: 73.7 kg 74.2 kg 76.5 kg    Physical Exam:  General exam: awake, alert, no acute distress Respiratory system: CTAB, no wheezes, rales or rhonchi, normal respiratory effort. Cardiovascular system: normal S1/S2, RRR Extremities: left thigh with palpable crepitus and induration, moderate erythema, painful on palpation.     Labs   Data Reviewed: I have personally reviewed following labs and imaging studies  CBC: Recent Labs  Lab 01/29/20 0255 01/30/20 0730 01/31/20 0421 02/01/20 0310 02/02/20 0444  WBC 11.0* 13.7* 15.6* 16.3* 23.1*  HGB 7.5* 7.6* 7.2* 7.0* 6.6*  HCT 23.3* 23.9* 22.6* 22.2* 19.8*  MCV 83.5 85.7 83.4 85.1 81.1  PLT 242 258 286 271 300   Basic Metabolic Panel: Recent Labs  Lab 01/29/20 0255 01/29/20 1414 01/30/20 0449 01/31/20 0421 02/01/20 0310 02/02/20 0444  NA 127*  --  130* 131* 133* 133*  K 5.1  --  4.8 4.9 5.0 5.0  CL 98  --  101 103 105 103  CO2 17*  --  17* 17* 16* 19*  GLUCOSE 83  --  156* 151* 138* 163*  BUN  101*  --  95* 94* 88* 88*  CREATININE 3.89*  --  3.64* 3.48* 3.58* 3.77*  CALCIUM 7.3*  --  7.5* 7.8* 7.9* 8.1*  MG  --  1.8  --   --   --   --    GFR: Estimated Creatinine Clearance: 15.1 mL/min (A) (by C-G formula based on SCr of 3.77 mg/dL (H)). Liver Function Tests: No results for input(s): AST, ALT, ALKPHOS, BILITOT, PROT, ALBUMIN in the last 168 hours. No results for input(s): LIPASE, AMYLASE in the last 168 hours. No results for input(s): AMMONIA in the last 168 hours. Coagulation Profile: Recent Labs  Lab 01/28/20 2313  INR 1.5*   Cardiac Enzymes: No results for input(s): CKTOTAL, CKMB, CKMBINDEX, TROPONINI in the last 168 hours. BNP (last 3 results) No results for input(s): PROBNP in the last 8760 hours. HbA1C: No results for input(s): HGBA1C in the last 72 hours. CBG: Recent Labs  Lab 01/28/20 2201 01/29/20 0244 01/29/20 0558 01/29/20 0759 01/29/20 1004  GLUCAP 85 85 74 118* 189*   Lipid Profile: No results for input(s): CHOL, HDL, LDLCALC, TRIG, CHOLHDL, LDLDIRECT in the last 72 hours. Thyroid Function Tests: No results for input(s): TSH, T4TOTAL, FREET4, T3FREE, THYROIDAB in the last 72 hours. Anemia Panel: No results for input(s): VITAMINB12, FOLATE, FERRITIN, TIBC, IRON, RETICCTPCT in the last 72 hours. Sepsis Labs: No results for input(s): PROCALCITON, LATICACIDVEN in the last 168 hours.  Recent Results (from the past 240 hour(s))  SARS Coronavirus 2 by RT PCR (hospital order, performed in [facility] hospital lab) Nasopharyngeal Nasopharyngeal Swab     Status: None   Collection Time: 01/28/20  9:04 AM   Specimen: Nasopharyngeal Swab  Result Value Ref Range Status   SARS Coronavirus 2 NEGATIVE NEGATIVE Final    Comment: (NOTE) SARS-CoV-2 target nucleic acids are NOT DETECTED.  The SARS-CoV-2 RNA is generally detectable in upper and lower respiratory specimens during the acute phase of infection. The lowest concentration of SARS-CoV-2 viral copies  this assay can detect is 250 copies / mL. A negative result does not preclude SARS-CoV-2 infection and should not be used as the sole basis for treatment or other patient management decisions.  A negative result may occur with improper specimen collection / handling,  submission of specimen other than nasopharyngeal swab, presence of viral mutation(s) within the areas targeted by this assay, and inadequate number of viral copies (<250 copies / mL). A negative result must be combined with clinical observations, patient history, and epidemiological information.  Fact Sheet for Patients:   StrictlyIdeas.no  Fact Sheet for Healthcare Providers: BankingDealers.co.za  This test is not yet approved or  cleared by the [place] FDA and has been authorized for detection and/or diagnosis of SARS-CoV-2 by FDA under an Emergency Use Authorization (EUA).  This EUA will remain in effect (meaning this test can be used) for the duration of the COVID-19 declaration under Section 564(b)(1) of the Act, 21 U.S.C. section 360bbb-3(b)(1), unless the authorization is terminated or revoked sooner.  Performed at [facility], [address] [place], [place] [address]   MRSA PCR Screening     Status: None   Collection Time: 01/29/20 10:10 AM   Specimen: Nasopharyngeal  Result Value Ref Range Status   MRSA by PCR NEGATIVE NEGATIVE Final    Comment:        The GeneXpert MRSA Assay (FDA approved for NASAL specimens only), is one component of a comprehensive MRSA colonization surveillance program. It is not intended to diagnose MRSA infection nor to guide or monitor treatment for MRSA infections. Performed at [facility], [address] [place], [place] [address]   CULTURE, BLOOD (ROUTINE X 2) w Reflex to ID Panel     Status: None   Collection Time: 01/29/20  2:14 PM   Specimen: BLOOD  Result Value Ref Range Status   Specimen  Description BLOOD BLOOD LEFT HAND  Final   Special Requests   Final    BOTTLES DRAWN AEROBIC AND ANAEROBIC Blood Culture adequate volume   Culture   Final    NO GROWTH 5 DAYS Performed at [facility], [address] [place], [place] [address]    Report Status 02/03/2020 FINAL  Final  CULTURE, BLOOD (ROUTINE X 2) w Reflex to ID Panel     Status: None   Collection Time: 01/29/20  3:07 PM   Specimen: BLOOD  Result Value Ref Range Status   Specimen Description BLOOD BLOOD LEFT HAND  Final   Special Requests   Final    BOTTLES DRAWN AEROBIC AND ANAEROBIC Blood Culture results may not be optimal due to an excessive volume of blood received in culture bottles   Culture   Final    NO GROWTH 5 DAYS Performed at [facility], [address], [place], [place] [address]    Report Status 02/03/2020 FINAL  Final  Urine Culture     Status: Abnormal   Collection Time: 01/30/20  1:56 PM   Specimen: Urine, Clean Catch  Result Value Ref Range Status   Specimen Description   Final    URINE, CLEAN CATCH Performed at [facility], [address], [place], [place] [address]    Special Requests   Final    NONE Performed at [facility], [address] [place],  [address]    Culture >=100,000 COLONIES/mL ENTEROCOCCUS FAECIUM (A)  Final   Report Status 02/01/2020 FINAL  Final   Organism ID, Bacteria ENTEROCOCCUS FAECIUM (A)  Final      Susceptibility   Enterococcus faecium - MIC*    AMPICILLIN >=32 RESISTANT Resistant     NITROFURANTOIN 256 RESISTANT Resistant     VANCOMYCIN <=0.5 SENSITIVE Sensitive     * >=100,000 COLONIES/mL ENTEROCOCCUS FAECIUM  Imaging Studies   No results found.   Medications   Scheduled Meds: . sodium chloride   Intravenous Once   Continuous Infusions:     LOS: 7 days    Time spent: 20 minutes    Ezekiel Slocumb, DO [facility]  02/04/2020, 2:30 PM    If 7PM-7AM, please contact  night-coverage. How to contact the [facility] Attending or Consulting provider [place] or covering provider during after hours [place], for this patient?    1. Check the care team in [facility] and look for a) attending/consulting [facility] provider listed and b) the [facility] team listed 2. Log into [email or web address] and use [place]'s universal password to access. If you do not have the password, please contact the hospital operator. 3. Locate the [facility] provider you are looking for under [facility] and page to a number that you can be directly reached. 4. If you still have difficulty reaching the provider, please page the [facility] (Director on Call) for the Hospitalists listed on amion for assistance.

## 2020-02-04 NOTE — Plan of Care (Signed)
  Problem: Cardiac: Goal: Ability to achieve and maintain adequate cardiopulmonary perfusion will improve Outcome: Progressing   Problem: Clinical Measurements: Goal: Ability to maintain clinical measurements within normal limits will improve Outcome: Progressing Goal: Respiratory complications will improve Outcome: Progressing Goal: Cardiovascular complication will be avoided Outcome: Progressing   Problem: Coping: Goal: Level of anxiety will decrease Outcome: Progressing

## 2020-02-05 NOTE — Discharge Summary (Signed)
Physician Discharge Summary  GAR GLANCE ZJQ:[id number] DOB: [birth year] DOA: 01/28/2020  PCP: Tracie Harrier, MD  Admit date: 01/28/2020 Discharge date: 02/05/2020  Admitted From: home Disposition:  hospice  Recommendations for Outpatient Follow-up:  1. Any follow ups per hospice team  Home Health: No  Equipment/Devices: None   Discharge Condition: Guarded but stable CODE STATUS: DNR  Diet recommendation:  Regular  Discharge Diagnoses: Principal Problem:   Hyponatremia Active Problems:   COPD (chronic obstructive pulmonary disease) (HCC)   Acute on chronic renal failure (HCC)   Sepsis secondary to UTI (HCC)   Hypotension   Presence of urostomy (HCC)   DVT (deep venous thrombosis) (HCC)   Atypical atrial flutter (HCC)   Goals of care, counseling/discussion   Palliative care by specialist   DNR (do not resuscitate)   Necrotizing myositis ([place])   Comfort measures only status    Summary of HPI and Hospital Course:  Bryan Brewer is a 79 y.o. male with medical history significant for CAD, HFpEF, CKD 4, COPD, bladder cancer status post cystoprostatectomy with urostomy and ileal conduit, CAD s/p CABG who presented to the ED for evaluation of weakness and nausea.  Also concern for some blood in his urine and had been on a blood thinner since he was discharged from the hospital following an intramedullary nail placement in his left hip.  Labs revealed worsening of his renal function from baseline, his serum creatinine on discharge was 2.86 and today on admission it is 4.06, sodium is low at 123 and he has a white count of 14.5 with a hemoglobin of 8.2 that is chronic. Chest x-ray shows no acute findings and he has pyuria that appears to be chronic. Patient has swelling of his left lower extremity and venous Doppler shows positive for acute nonocclusive DVT within the popliteal vein.   He was ultimately admitted with new onset atrial fibrillation with RVR in addition to sepsis due  to UTI.  He was initially hypotensive, requiring ICU care.  Cardiology was consulted and had been planning for cardioversion.  However, despite antibiotics for his UTI, WBC continued to rise and patient having complaining of right lower extremity pain on 02/01/2020 with crepitus on exam.  CT scan ordered which showed necrotizing fasciitis, myositis and symphyseal osteomyelitis.  After this result, patient and family had goals of care with discussion with the attending and palliative care team, given surgery would be required and patient not a candidate for surgery due to his cardiac and respiratory issues.  Patient and family decided on comfort care measures and discharge to residential hospice.  Patient is stable for discharge to hospice today.    Discharge Instructions   Medications post discharge will be per hospice.   Allergies as of 02/05/2020   No Known Allergies     Medication List    STOP taking these medications   acetaminophen 500 MG tablet Commonly known as: TYLENOL   aspirin EC 81 MG tablet   atorvastatin 80 MG tablet Commonly known as: LIPITOR   blood glucose meter kit and supplies Kit   Darbepoetin Alfa 40 MCG/0.4ML Sosy injection Commonly known as: ARANESP   enoxaparin 40 MG/0.4ML injection Commonly known as: LOVENOX   ferrous sulfate 325 (65 FE) MG EC tablet   furosemide 20 MG tablet Commonly known as: LASIX   glipiZIDE 5 MG tablet Commonly known as: GLUCOTROL   ipratropium-albuterol 0.5-2.5 (3) MG/3ML Soln Commonly known as: DUONEB   metoprolol tartrate 25 MG tablet Commonly known  as: LOPRESSOR   pantoprazole 40 MG tablet Commonly known as: Protonix   sodium bicarbonate 650 MG tablet   traMADol 50 MG tablet Commonly known as: ULTRAM       No Known Allergies  Consultations:  Palliative Care    Procedures/Studies: DG Elbow Complete Left  Result Date: 01/13/2020 CLINICAL DATA:  Status post EXAM: LEFT ELBOW - COMPLETE 3+ VIEW COMPARISON:   None. FINDINGS: There is no evidence of fracture, dislocation, or joint effusion. There is no evidence of arthropathy or other focal bone abnormality. Soft tissues are unremarkable. IMPRESSION: Negative. Electronically Signed   By: Constance Holster M.D.   On: 01/13/2020 22:17   CT Head Wo Contrast  Result Date: 01/13/2020 CLINICAL DATA:  Head trauma, headache, fall exiting tract EXAM: CT HEAD WITHOUT CONTRAST CT CERVICAL SPINE WITHOUT CONTRAST TECHNIQUE: Multidetector CT imaging of the head and cervical spine was performed following the standard protocol without intravenous contrast. Multiplanar CT image reconstructions of the cervical spine were also generated. COMPARISON:  PET-CT 10/15/2019 FINDINGS: CT HEAD FINDINGS Brain: No evidence of acute infarction, hemorrhage, hydrocephalus, extra-axial collection or mass lesion/mass effect. Symmetric prominence of the ventricles, cisterns and sulci compatible with parenchymal volume loss. Patchy areas of white matter hypoattenuation are most compatible with chronic microvascular angiopathy. Vascular: Atherosclerotic calcification of the carotid siphons and intradural vertebral arteries. No hyperdense vessel. Skull: No calvarial fracture or suspicious osseous lesion. No scalp swelling or hematoma. Sinuses/Orbits: Paranasal sinuses and mastoid air cells are predominantly clear. Orbital structures are unremarkable aside from prior lens extractions. Other: Rightward nasal septal deviation with a contacting right-sided nasal septal spur. Minimal nonspecific cutaneous thickening of the right malar soft tissues, correlate with visual inspection. CT CERVICAL SPINE FINDINGS Alignment: Stabilization collar absent at the time of examination. There is mild rightward cranial rotation. No evidence of traumatic listhesis. No abnormally widened, perched or jumped facets. Normal alignment of the craniocervical and atlantoaxial articulations accounting for the degree of rotation.  Skull base and vertebrae: No skull base or vertebral body fractures. No vertebral body height loss. No worrisome osseous lesions. Soft tissues and spinal canal: No pre or paravertebral fluid or swelling. No visible canal hematoma. Disc levels: Multilevel intervertebral disc height loss with spondylitic endplate changes. Large disc osteophyte complex at C6-7 results in mild canal stenosis. Smaller complex at C5-6 effaces the ventral thecal sac without significant canal stenosis. Multilevel uncinate spurring and facet hypertrophic changes are also maximal at these levels with at most mild to moderate foraminal narrowing. Upper chest: Extensive centrilobular and paraseptal emphysematous changes in the lung apices. No consolidation or acute cardiopulmonary abnormality. Additional biapical pleuroparenchymal scarring. Other: Extensive vascular calcium throughout the proximal great vessels and cervical carotids. Normal thyroid. IMPRESSION: 1. No evidence of acute intracranial abnormality. No significant scalp swelling or calvarial fracture. 2. Chronic microvascular angiopathy and parenchymal volume loss. 3. No evidence of acute fracture or traumatic listhesis of the cervical spine. 4. Multilevel degenerative disc disease and facet hypertrophic changes of the cervical spine, most prominent at C6-7 where there is mild canal stenosis. 5. Extensive vascular calcium throughout the proximal great vessels and cervical carotids. 6. Focal nonspecific skin thickening of the right malar soft tissues, correlate with visual inspection. 7. Emphysema (ICD10-J43.9). Electronically Signed   By: Lovena Le M.D.   On: 01/13/2020 22:43   CT Cervical Spine Wo Contrast  Result Date: 01/13/2020 CLINICAL DATA:  Head trauma, headache, fall exiting tract EXAM: CT HEAD WITHOUT CONTRAST CT CERVICAL SPINE WITHOUT CONTRAST TECHNIQUE:  Multidetector CT imaging of the head and cervical spine was performed following the standard protocol without  intravenous contrast. Multiplanar CT image reconstructions of the cervical spine were also generated. COMPARISON:  PET-CT 10/15/2019 FINDINGS: CT HEAD FINDINGS Brain: No evidence of acute infarction, hemorrhage, hydrocephalus, extra-axial collection or mass lesion/mass effect. Symmetric prominence of the ventricles, cisterns and sulci compatible with parenchymal volume loss. Patchy areas of white matter hypoattenuation are most compatible with chronic microvascular angiopathy. Vascular: Atherosclerotic calcification of the carotid siphons and intradural vertebral arteries. No hyperdense vessel. Skull: No calvarial fracture or suspicious osseous lesion. No scalp swelling or hematoma. Sinuses/Orbits: Paranasal sinuses and mastoid air cells are predominantly clear. Orbital structures are unremarkable aside from prior lens extractions. Other: Rightward nasal septal deviation with a contacting right-sided nasal septal spur. Minimal nonspecific cutaneous thickening of the right malar soft tissues, correlate with visual inspection. CT CERVICAL SPINE FINDINGS Alignment: Stabilization collar absent at the time of examination. There is mild rightward cranial rotation. No evidence of traumatic listhesis. No abnormally widened, perched or jumped facets. Normal alignment of the craniocervical and atlantoaxial articulations accounting for the degree of rotation. Skull base and vertebrae: No skull base or vertebral body fractures. No vertebral body height loss. No worrisome osseous lesions. Soft tissues and spinal canal: No pre or paravertebral fluid or swelling. No visible canal hematoma. Disc levels: Multilevel intervertebral disc height loss with spondylitic endplate changes. Large disc osteophyte complex at C6-7 results in mild canal stenosis. Smaller complex at C5-6 effaces the ventral thecal sac without significant canal stenosis. Multilevel uncinate spurring and facet hypertrophic changes are also maximal at these levels  with at most mild to moderate foraminal narrowing. Upper chest: Extensive centrilobular and paraseptal emphysematous changes in the lung apices. No consolidation or acute cardiopulmonary abnormality. Additional biapical pleuroparenchymal scarring. Other: Extensive vascular calcium throughout the proximal great vessels and cervical carotids. Normal thyroid. IMPRESSION: 1. No evidence of acute intracranial abnormality. No significant scalp swelling or calvarial fracture. 2. Chronic microvascular angiopathy and parenchymal volume loss. 3. No evidence of acute fracture or traumatic listhesis of the cervical spine. 4. Multilevel degenerative disc disease and facet hypertrophic changes of the cervical spine, most prominent at C6-7 where there is mild canal stenosis. 5. Extensive vascular calcium throughout the proximal great vessels and cervical carotids. 6. Focal nonspecific skin thickening of the right malar soft tissues, correlate with visual inspection. 7. Emphysema (ICD10-J43.9). Electronically Signed   By: Lovena Le M.D.   On: 01/13/2020 22:43   CT FEMUR RIGHT WO CONTRAST  Result Date: 02/02/2020 CLINICAL DATA:  Crepitus, soft tissue gas suspected.  Elevated WBC. EXAM: CT OF THE LOWER RIGHT FEMUR WITHOUT CONTRAST CT OF THE LOWER RIGHT TIBIA/FIBULA WITHOUT CONTRAST TECHNIQUE: Multidetector CT imaging of the right femur was performed according to the standard protocol. Multidetector CT imaging of the right tibia and fibula was performed according to the standard protocol. COMPARISON:  None. FINDINGS: Bones/Joint/Cartilage Air in the pubic symphysis with bone destruction consistent with symphyseal osteomyelitis with small amount air in the soft tissues along the anterior superficial aspect and air tracking in the pre vesicular space which is suboptimally visualized. No fracture or dislocation. Normal alignment. No joint effusion. Mild joint space narrowing of the right hip. Severe medial femorotibial compartment  and lateral femorotibial compartment joint space narrowing consistent with advanced osteoarthritis. Mild patellofemoral compartment joint space narrowing. Mild osteoarthritis of the tibiotalar joint. Ligaments Ligaments are suboptimally evaluated by CT. Soft tissue, Muscles and Tendons Moderate atrophy of the  semimembranosus muscle. Fatty atrophy of the rectus femoris muscle. Extensive soft tissue emphysema along the anterior aspect of the thigh extending medially along the lower thigh and knee and posterior medially into the lower leg to the level just above the ankle. Soft tissue emphysema in the rectus femoris muscle. Extensive soft tissue emphysema in the adductor musculature most severe in the adductor magnus. Additionally there is a 2.4 x 4.2 x 6.8 cm hypodense area in the adductor magnus which may reflect an abscess or necrosis. Soft tissue emphysema in the sartorius muscle. Peripheral vascular atherosclerotic disease. Extensive soft tissue edema throughout the right thigh and bilateral lower legs likely reactive. IMPRESSION: 1. Extensive soft tissue emphysema along the anterior aspect of the thigh extending medially along the lower thigh and knee and posterior medially into the lower leg to the level just above the ankle most concerning for necrotizing fasciitis. Soft tissue emphysema in the rectus femoris muscle and adductor musculature as detailed above most concerning for necrotizing myositis. 2.4 x 4.2 x 6.8 cm hypodense area in the adductor magnus which may reflect an abscess or myonecrosis. Extensive symphyseal osteomyelitis which is partially visualized as the entire areas not included in the field of view. 2.  Tricompartmental osteoarthritis of the right knee. Critical Value/emergent results were called by telephone at the time of interpretation on 02/02/2020 at 12:17 pm to provider [facility] , who verbally acknowledged these results. Electronically Signed   By: Kathreen Devoid   On: 02/02/2020  12:19   CT TIBIA FIBULA RIGHT WO CONTRAST  Result Date: 02/02/2020 CLINICAL DATA:  Crepitus, soft tissue gas suspected.  Elevated WBC. EXAM: CT OF THE LOWER RIGHT FEMUR WITHOUT CONTRAST CT OF THE LOWER RIGHT TIBIA/FIBULA WITHOUT CONTRAST TECHNIQUE: Multidetector CT imaging of the right femur was performed according to the standard protocol. Multidetector CT imaging of the right tibia and fibula was performed according to the standard protocol. COMPARISON:  None. FINDINGS: Bones/Joint/Cartilage Air in the pubic symphysis with bone destruction consistent with symphyseal osteomyelitis with small amount air in the soft tissues along the anterior superficial aspect and air tracking in the pre vesicular space which is suboptimally visualized. No fracture or dislocation. Normal alignment. No joint effusion. Mild joint space narrowing of the right hip. Severe medial femorotibial compartment and lateral femorotibial compartment joint space narrowing consistent with advanced osteoarthritis. Mild patellofemoral compartment joint space narrowing. Mild osteoarthritis of the tibiotalar joint. Ligaments Ligaments are suboptimally evaluated by CT. Soft tissue, Muscles and Tendons Moderate atrophy of the semimembranosus muscle. Fatty atrophy of the rectus femoris muscle. Extensive soft tissue emphysema along the anterior aspect of the thigh extending medially along the lower thigh and knee and posterior medially into the lower leg to the level just above the ankle. Soft tissue emphysema in the rectus femoris muscle. Extensive soft tissue emphysema in the adductor musculature most severe in the adductor magnus. Additionally there is a 2.4 x 4.2 x 6.8 cm hypodense area in the adductor magnus which may reflect an abscess or necrosis. Soft tissue emphysema in the sartorius muscle. Peripheral vascular atherosclerotic disease. Extensive soft tissue edema throughout the right thigh and bilateral lower legs likely reactive. IMPRESSION:  1. Extensive soft tissue emphysema along the anterior aspect of the thigh extending medially along the lower thigh and knee and posterior medially into the lower leg to the level just above the ankle most concerning for necrotizing fasciitis. Soft tissue emphysema in the rectus femoris muscle and adductor musculature as detailed above most concerning for  necrotizing myositis. 2.4 x 4.2 x 6.8 cm hypodense area in the adductor magnus which may reflect an abscess or myonecrosis. Extensive symphyseal osteomyelitis which is partially visualized as the entire areas not included in the field of view. 2.  Tricompartmental osteoarthritis of the right knee. Critical Value/emergent results were called by telephone at the time of interpretation on 02/02/2020 at 12:17 pm to provider [facility] , who verbally acknowledged these results. Electronically Signed   By: Kathreen Devoid   On: 02/02/2020 12:19   US Venous Img Lower Unilateral Left (DVT)  Result Date: 01/28/2020 CLINICAL DATA:  Left leg pain and swelling EXAM: Left LOWER EXTREMITY VENOUS DOPPLER ULTRASOUND TECHNIQUE: Gray-scale sonography with compression, as well as color and duplex ultrasound, were performed to evaluate the deep venous system(s) from the level of the common femoral vein through the popliteal and proximal calf veins. COMPARISON:  None. FINDINGS: VENOUS Normal compressibility of the common femoral and superficial femoral veins. Noncompressible left popliteal vein with nonocclusive thrombus present. Peroneal veins are not well demonstrated. Visualized portions of profunda femoral vein and great saphenous vein unremarkable. Doppler waveforms show normal direction of venous flow, normal respiratory plasticity and response to augmentation within the patent lower extremity veins. Limited views of the contralateral common femoral vein are unremarkable. OTHER Left groin lymph nodes measuring up to 4.7 cm. Fluid and edema within the subcutaneous soft  tissues. Limitations: none IMPRESSION: Positive for acute nonocclusive DVT within the popliteal vein. These results will be called to the ordering clinician or representative by the Radiologist Assistant, and communication documented in the PACS or [employer]. Electronically Signed   By: Donavan Foil M.D.   On: 01/28/2020 15:32   US Venous Img Lower Unilateral Right (DVT)  Result Date: 02/01/2020 CLINICAL DATA:  Right lower extremity pain.  Evaluate for DVT. EXAM: RIGHT LOWER EXTREMITY VENOUS DOPPLER ULTRASOUND TECHNIQUE: Gray-scale sonography with graded compression, as well as color Doppler and duplex ultrasound were performed to evaluate the lower extremity deep venous systems from the level of the common femoral vein and including the common femoral, femoral, profunda femoral, popliteal and calf veins including the posterior tibial, peroneal and gastrocnemius veins when visible. The superficial great saphenous vein was also interrogated. Spectral Doppler was utilized to evaluate flow at rest and with distal augmentation maneuvers in the common femoral, femoral and popliteal veins. COMPARISON:  None. FINDINGS: The examination is degraded due to ill-defined echogenic shadowing, presumably from subcutaneous air, partially obscuring the evaluation of the venous structures from the level of the proximal to the distal anterior thigh. Contralateral Common Femoral Vein: Respiratory phasicity is normal and symmetric with the symptomatic side. No evidence of thrombus. Normal compressibility. Common Femoral Vein: No evidence of thrombus. Normal compressibility, respiratory phasicity and response to augmentation. Saphenofemoral Junction: No evidence of thrombus. Normal compressibility and flow on color Doppler imaging. Profunda Femoral Vein: No evidence of thrombus. Normal compressibility and flow on color Doppler imaging. Femoral Vein: No evidence of thrombus. Normal compressibility, respiratory phasicity and  response to augmentation. Popliteal Vein: No evidence of thrombus. Normal compressibility, respiratory phasicity and response to augmentation. Calf Veins: No evidence of thrombus. Normal compressibility and flow on color Doppler imaging. Superficial Great Saphenous Vein: No evidence of thrombus. Normal compressibility. Venous Reflux:  None. Other Findings:  None. IMPRESSION: 1. No evidence of DVT within the right lower extremity. 2. Examination is degraded secondary to indeterminate ill-defined echogenic shadowing involving the anterior medial aspect of the thigh, nonspecific though potentially secondary to  subcutaneous emphysema. Clinical correlation is advised. Further evaluation could performed with right femur radiographs or CT as indicated. Critical Value/emergent results were called by telephone at the time of interpretation on 02/01/2020 at 3:22 pm to provider [facility] , who verbally acknowledged these results. Electronically Signed   By: Sandi Mariscal M.D.   On: 02/01/2020 15:29   DG CHEST PORT 1 VIEW  Result Date: 02/01/2020 CLINICAL DATA:  Central line placement EXAM: PORTABLE CHEST 1 VIEW COMPARISON:  None. FINDINGS: Right jugular central venous catheter with the tip projecting over the cavoatrial junction. Bilateral diffuse interstitial thickening. Trace left pleural effusion. No pneumothorax. Stable cardiomegaly. Prior CABG. No acute osseous abnormality. IMPRESSION: 1. Right jugular central venous catheter with the tip projecting over the cavoatrial junction. 2. Cardiomegaly with pulmonary vascular congestion. Electronically Signed   By: Kathreen Devoid   On: 02/01/2020 12:41   DG CHEST PORT 1 VIEW  Result Date: 02/01/2020 CLINICAL DATA:  Central line placement EXAM: PORTABLE CHEST 1 VIEW COMPARISON:  01/28/2020 FINDINGS: Bilateral interstitial thickening. Trace left pleural effusion. No right pleural effusion or pneumothorax. Stable cardiomegaly. Prior CABG. Interval placement of a right sided  central venous catheter with the tip overlying the right axilla. Recommend repositioning catheter prior to use. IMPRESSION: Interval placement of a right sided central venous catheter with the tip overlying the right axilla. Recommend repositioning catheter prior to use. Electronically Signed   By: Kathreen Devoid   On: 02/01/2020 12:16   DG Chest Portable 1 View  Result Date: 01/28/2020 CLINICAL DATA:  Shortness of breath, fell 2 weeks ago and broke LEFT hip, decreased urine output, gross hematuria with strong smelling urine, history heart failure, COPD, coronary artery disease, type II diabetes mellitus, arrhythmias, hypertension EXAM: PORTABLE CHEST 1 VIEW COMPARISON:  Portable exam 0834 hours compared to 01/13/2020 FINDINGS: Rotated to the LEFT. Normal heart size post CABG. Mediastinal contours and pulmonary vascularity normal. Atherosclerotic calcification aorta. LEFT basilar atelectasis. Chronic accentuation of interstitial markings unchanged. Tiny LEFT pleural effusion. No acute infiltrate or pneumothorax. IMPRESSION: New LEFT basilar atelectasis and tiny LEFT pleural effusion. Underlying chronic lung disease changes. Aortic Atherosclerosis (ICD10-I70.0). Electronically Signed   By: Lavonia Dana M.D.   On: 01/28/2020 08:44   DG Chest Portable 1 View  Result Date: 01/13/2020 CLINICAL DATA:  Pain EXAM: PORTABLE CHEST 1 VIEW COMPARISON:  04/29/2019 FINDINGS: The heart size is stable but enlarged. The patient is status post prior median sternotomy. There are aortic calcifications. There is mild vascular congestion without overt pulmonary edema. There is no pneumothorax. No significant pleural effusion. IMPRESSION: Cardiomegaly with mild vascular congestion. No overt pulmonary edema or significant pleural effusion. Electronically Signed   By: Constance Holster M.D.   On: 01/13/2020 22:17   DG Shoulder Left  Result Date: 01/13/2020 CLINICAL DATA:  Pain status post fall EXAM: LEFT SHOULDER - 2+ VIEW  COMPARISON:  None. FINDINGS: There is no evidence of fracture or dislocation. There is no evidence of arthropathy or other focal bone abnormality. Soft tissues are unremarkable. IMPRESSION: Negative. Electronically Signed   By: Constance Holster M.D.   On: 01/13/2020 22:18   ECHOCARDIOGRAM COMPLETE  Result Date: 02/01/2020    ECHOCARDIOGRAM REPORT   Patient Name:   Bryan Brewer Date of Exam: 02/01/2020 Medical Rec #:  [id number]      Height:       67.0 in Accession #:    [id number]     Weight:       163.6 lb  Date of Birth:  [birth year]      BSA:          1.857 m Patient Age:    79 years       BP:           92/50 mmHg Patient Gender: M              HR:           125 bpm. Exam Location:  [facility] Procedure: 2D Echo, Cardiac Doppler and Color Doppler Indications:     Atrial Flutter 427.32  History:         Patient has prior history of Echocardiogram examinations, most                  recent 03/31/2019. Prior CABG, COPD; Risk Factors:Diabetes and                  Hypertension.  Sonographer:     [name] RDCS (AE) Referring Phys:  [place] Diagnosing Phys: Kathlyn Sacramento MD IMPRESSIONS  1. Left ventricular ejection fraction, by estimation, is 35 to 40%. The left ventricle has moderately decreased function. The left ventricle demonstrates global hypokinesis. Left ventricular diastolic parameters are indeterminate.  2. Right ventricular systolic function is normal. The right ventricular size is normal. There is normal pulmonary artery systolic pressure.  3. Left atrial size was moderately dilated.  4. Right atrial size was mildly dilated.  5. The mitral valve is normal in structure. Moderate mitral valve regurgitation. No evidence of mitral stenosis.  6. The aortic valve is normal in structure. Aortic valve regurgitation is not visualized. Mild aortic valve sclerosis is present, with no evidence of aortic valve stenosis.  7. The inferior vena cava is normal in size with greater than 50% respiratory  variability, suggesting right atrial pressure of 3 mmHg. FINDINGS  Left Ventricle: Left ventricular ejection fraction, by estimation, is 35 to 40%. The left ventricle has moderately decreased function. The left ventricle demonstrates global hypokinesis. The left ventricular internal cavity size was normal in size. There is no left ventricular hypertrophy. Left ventricular diastolic parameters are indeterminate. Right Ventricle: The right ventricular size is normal. No increase in right ventricular wall thickness. Right ventricular systolic function is normal. There is normal pulmonary artery systolic pressure. The tricuspid regurgitant velocity is 2.52 m/s, and  with an assumed right atrial pressure of 5 mmHg, the estimated right ventricular systolic pressure is 03.8 mmHg. Left Atrium: Left atrial size was moderately dilated. Right Atrium: Right atrial size was mildly dilated. Pericardium: There is no evidence of pericardial effusion. Mitral Valve: The mitral valve is normal in structure. Normal mobility of the mitral valve leaflets. Moderate mitral valve regurgitation. No evidence of mitral valve stenosis. Tricuspid Valve: The tricuspid valve is normal in structure. Tricuspid valve regurgitation is mild . No evidence of tricuspid stenosis. Aortic Valve: The aortic valve is normal in structure. Aortic valve regurgitation is not visualized. Mild aortic valve sclerosis is present, with no evidence of aortic valve stenosis. Aortic valve mean gradient measures 2.0 mmHg. Aortic valve peak gradient measures 3.6 mmHg. Aortic valve area, by VTI measures 3.31 cm. Pulmonic Valve: The pulmonic valve was normal in structure. Pulmonic valve regurgitation is not visualized. No evidence of pulmonic stenosis. Aorta: The aortic root is normal in size and structure. Venous: The inferior vena cava is normal in size with greater than 50% respiratory variability, suggesting right atrial pressure of 3 mmHg. IAS/Shunts: No atrial level  shunt detected by color flow Doppler.  LEFT VENTRICLE PLAX 2D LVIDd:         4.74 cm  Diastology LVIDs:         3.96 cm  LV e' lateral:   4.57 cm/s LV PW:         1.02 cm  LV E/e' lateral: 19.8 LV IVS:        1.04 cm  LV e' medial:    5.00 cm/s LVOT diam:     2.00 cm  LV E/e' medial:  18.1 LV SV:         45 LV SV Index:   24 LVOT Area:     3.14 cm  RIGHT VENTRICLE RV Basal diam:  4.16 cm RV S prime:     9.14 cm/s TAPSE (M-mode): 3.4 cm LEFT ATRIUM              Index       RIGHT ATRIUM           Index LA diam:        5.10 cm  2.75 cm/m  RA Area:     20.60 cm LA Vol (A2C):   112.0 ml 60.32 ml/m RA Volume:   55.20 ml  29.73 ml/m LA Vol (A4C):   118.0 ml 63.55 ml/m LA Biplane Vol: 115.0 ml 61.94 ml/m  AORTIC VALVE                   PULMONIC VALVE AV Area (Vmax):    2.31 cm    PV Vmax:        0.73 m/s AV Area (Vmean):   2.25 cm    PV Peak grad:   2.1 mmHg AV Area (VTI):     3.31 cm    RVOT Peak grad: 4 mmHg AV Vmax:           94.70 cm/s AV Vmean:          68.550 cm/s AV VTI:            0.136 m AV Peak Grad:      3.6 mmHg AV Mean Grad:      2.0 mmHg LVOT Vmax:         69.50 cm/s LVOT Vmean:        49.100 cm/s LVOT VTI:          0.144 m LVOT/AV VTI ratio: 1.05  AORTA Ao Root diam: 3.40 cm MITRAL VALVE               TRICUSPID VALVE MV Area (PHT): 5.54 cm    TR Peak grad:   25.4 mmHg MV Decel Time: 137 msec    TR Vmax:        252.00 cm/s MV E velocity: 90.70 cm/s MV A velocity: 46.10 cm/s  SHUNTS MV E/A ratio:  1.97        Systemic VTI:  0.14 m                            Systemic Diam: 2.00 cm Kathlyn Sacramento MD Electronically signed by Kathlyn Sacramento MD Signature Date/Time: 02/01/2020/10:09:19 AM    Final    DG HIP OPERATIVE UNILAT WITH PELVIS LEFT  Result Date: 01/14/2020 CLINICAL DATA:  Left hip ORIF EXAM: OPERATIVE LEFT HIP (WITH PELVIS IF PERFORMED) 2 VIEWS TECHNIQUE: Fluoroscopic spot image(s) were submitted for interpretation post-operatively. COMPARISON:  01/13/2020 FINDINGS: 4 C-arm fluoroscopic images  were obtained  intraoperatively and submitted for post operative interpretation. Interval placement of anterograde long intramedullary nail with proximal lag screw and distal interlocking screw traversing intertrochanteric fracture the proximal left femur. Improved osseous alignment. Please see the performing provider's procedural report for further detail. IMPRESSION: As above. Electronically Signed   By: Davina Poke D.O.   On: 01/14/2020 18:02   DG Hip Unilat W or Wo Pelvis 2-3 Views Left  Result Date: 01/13/2020 CLINICAL DATA:  Pain EXAM: DG HIP (WITH OR WITHOUT PELVIS) 2-3V LEFT COMPARISON:  None. FINDINGS: There is an acute displaced, comminuted intratrochanteric fracture of the proximal left femur. There is no dislocation. There are moderate degenerative changes of the left hip. There are mild degenerative changes of the right hip. IMPRESSION: Acute displaced, comminuted intratrochanteric fracture of the proximal left femur. Electronically Signed   By: Constance Holster M.D.   On: 01/13/2020 22:16       Subjective: Patient seen with wife and pastor at bedside this AM.  He appears less comfortable today, looks short of breath and agrees that he is.  He denies any significant pain however.  No other acute complaints.   Discharge Exam: Vitals:   02/04/20 0755 02/05/20 0811  BP: (!) 103/56 (!) 109/57  Pulse: (!) 139 (!) 140  Resp: 16 17  Temp: 98.2 F (36.8 C) 98.3 F (36.8 C)  SpO2: 100% 97%   Vitals:   02/02/20 1926 02/04/20 0453 02/04/20 0755 02/05/20 0811  BP: (!) 110/59 (!) 109/54 (!) 103/56 (!) 109/57  Pulse: (!) 129 (!) 137 (!) 139 (!) 140  Resp: 16 16 16 17   Temp: 97.8 F (36.6 C) 98.5 F (36.9 C) 98.2 F (36.8 C) 98.3 F (36.8 C)  TempSrc: Oral Oral  Oral  SpO2: 99% 100% 100% 97%  Weight:      Height:        General: Pt is alert, awake, not in acute distress Cardiovascular: irregularly irregular, S1/S2 +, no rubs, no gallops Respiratory: CTA bilaterally, no  wheezing, no rhonchi Abdominal: Soft, NT, ND, bowel sounds + Extremities: left thigh with palpable crepitus and tenderness on palpation, edema of upper extremities, moves all    The results of significant diagnostics from this hospitalization (including imaging, microbiology, ancillary and laboratory) are listed below for reference.     Microbiology: Recent Results (from the past 240 hour(s))  SARS Coronavirus 2 by RT PCR (hospital order, performed in [facility] hospital lab) Nasopharyngeal Nasopharyngeal Swab     Status: None   Collection Time: 01/28/20  9:04 AM   Specimen: Nasopharyngeal Swab  Result Value Ref Range Status   SARS Coronavirus 2 NEGATIVE NEGATIVE Final    Comment: (NOTE) SARS-CoV-2 target nucleic acids are NOT DETECTED.  The SARS-CoV-2 RNA is generally detectable in upper and lower respiratory specimens during the acute phase of infection. The lowest concentration of SARS-CoV-2 viral copies this assay can detect is 250 copies / mL. A negative result does not preclude SARS-CoV-2 infection and should not be used as the sole basis for treatment or other patient management decisions.  A negative result may occur with improper specimen collection / handling, submission of specimen other than nasopharyngeal swab, presence of viral mutation(s) within the areas targeted by this assay, and inadequate number of viral copies (<250 copies / mL). A negative result must be combined with clinical observations, patient history, and epidemiological information.  Fact Sheet for Patients:   StrictlyIdeas.no  Fact Sheet for Healthcare Providers: BankingDealers.co.za  This test is not  yet approved or  cleared by the [place] and has been authorized for detection and/or diagnosis of SARS-CoV-2 by FDA under an Emergency Use Authorization (EUA).  This EUA will remain in effect (meaning this test can be used) for the duration of  the COVID-19 declaration under Section 564(b)(1) of the Act, 21 U.S.C. section 360bbb-3(b)(1), unless the authorization is terminated or revoked sooner.  Performed at [facility], [address] [place], [place] [address]   MRSA PCR Screening     Status: None   Collection Time: 01/29/20 10:10 AM   Specimen: Nasopharyngeal  Result Value Ref Range Status   MRSA by PCR NEGATIVE NEGATIVE Final    Comment:        The GeneXpert MRSA Assay (FDA approved for NASAL specimens only), is one component of a comprehensive MRSA colonization surveillance program. It is not intended to diagnose MRSA infection nor to guide or monitor treatment for MRSA infections. Performed at [facility], [address] [place], [place] [address]   CULTURE, BLOOD (ROUTINE X 2) w Reflex to ID Panel     Status: None   Collection Time: 01/29/20  2:14 PM   Specimen: BLOOD  Result Value Ref Range Status   Specimen Description BLOOD BLOOD LEFT HAND  Final   Special Requests   Final    BOTTLES DRAWN AEROBIC AND ANAEROBIC Blood Culture adequate volume   Culture   Final    NO GROWTH 5 DAYS Performed at [facility], [address] [place], [place] [address]    Report Status 02/03/2020 FINAL  Final  CULTURE, BLOOD (ROUTINE X 2) w Reflex to ID Panel     Status: None   Collection Time: 01/29/20  3:07 PM   Specimen: BLOOD  Result Value Ref Range Status   Specimen Description BLOOD BLOOD LEFT HAND  Final   Special Requests   Final    BOTTLES DRAWN AEROBIC AND ANAEROBIC Blood Culture results may not be optimal due to an excessive volume of blood received in culture bottles   Culture   Final    NO GROWTH 5 DAYS Performed at [facility], [address] [place], [place] [address]    Report Status 02/03/2020 FINAL  Final  Urine Culture     Status: Abnormal   Collection Time: 01/30/20  1:56 PM   Specimen: Urine, Clean Catch  Result Value Ref Range Status   Specimen  Description   Final    URINE, CLEAN CATCH Performed at [facility], [address], [place], [place] [address]    Special Requests   Final    NONE Performed at [facility], [address] [place],  [address]    Culture >=100,000 COLONIES/mL ENTEROCOCCUS FAECIUM (A)  Final   Report Status 02/01/2020 FINAL  Final   Organism ID, Bacteria ENTEROCOCCUS FAECIUM (A)  Final      Susceptibility   Enterococcus faecium - MIC*    AMPICILLIN >=32 RESISTANT Resistant     NITROFURANTOIN 256 RESISTANT Resistant     VANCOMYCIN <=0.5 SENSITIVE Sensitive     * >=100,000 COLONIES/mL ENTEROCOCCUS FAECIUM     Labs: BNP (last 3 results) Recent Labs    03/31/19 0933 04/01/19 0357  BNP 646.0* 283.6*   Basic Metabolic Panel: Recent Labs  Lab 01/29/20 1414 01/30/20 0449 01/31/20 0421 02/01/20 0310 02/02/20 0444  NA  --  130* 131* 133* 133*  K  --  4.8 4.9 5.0 5.0  CL  --  101 103 105 103  CO2  --  17* 17* 16* 19*  GLUCOSE  --  156* 151* 138* 163*  BUN  --  95* 94* 88* 88*  CREATININE  --  3.64* 3.48* 3.58* 3.77*  CALCIUM  --  7.5* 7.8* 7.9* 8.1*  MG 1.8  --   --   --   --    Liver Function Tests: No results for input(s): AST, ALT, ALKPHOS, BILITOT, PROT, ALBUMIN in the last 168 hours. No results for input(s): LIPASE, AMYLASE in the last 168 hours. No results for input(s): AMMONIA in the last 168 hours. CBC: Recent Labs  Lab 01/30/20 0730 01/31/20 0421 02/01/20 0310 02/02/20 0444  WBC 13.7* 15.6* 16.3* 23.1*  HGB 7.6* 7.2* 7.0* 6.6*  HCT 23.9* 22.6* 22.2* 19.8*  MCV 85.7 83.4 85.1 81.1  PLT 258 286 271 273   Cardiac Enzymes: No results for input(s): CKTOTAL, CKMB, CKMBINDEX, TROPONINI in the last 168 hours. BNP: Invalid input(s): POCBNP CBG: Recent Labs  Lab 01/29/20 1004  GLUCAP 189*   D-Dimer No results for input(s): DDIMER in the last 72 hours. Hgb A1c No results for input(s): HGBA1C in the last 72 hours. Lipid Profile No results for  input(s): CHOL, HDL, LDLCALC, TRIG, CHOLHDL, LDLDIRECT in the last 72 hours. Thyroid function studies No results for input(s): TSH, T4TOTAL, T3FREE, THYROIDAB in the last 72 hours.  Invalid input(s): FREET3 Anemia work up No results for input(s): VITAMINB12, FOLATE, FERRITIN, TIBC, IRON, RETICCTPCT in the last 72 hours. Urinalysis    Component Value Date/Time   COLORURINE YELLOW (A) 01/28/2020 0959   APPEARANCEUR CLOUDY (A) 01/28/2020 0959   LABSPEC 1.010 01/28/2020 0959   PHURINE 7.0 01/28/2020 0959   GLUCOSEU NEGATIVE 01/28/2020 0959   HGBUR SMALL (A) 01/28/2020 0959   BILIRUBINUR NEGATIVE 01/28/2020 0959   KETONESUR NEGATIVE 01/28/2020 0959   PROTEINUR 100 (A) 01/28/2020 0959   NITRITE NEGATIVE 01/28/2020 0959   LEUKOCYTESUR MODERATE (A) 01/28/2020 0959   Sepsis Labs Invalid input(s): PROCALCITONIN,  WBC,  LACTICIDVEN Microbiology Recent Results (from the past 240 hour(s))  SARS Coronavirus 2 by RT PCR (hospital order, performed in [facility] hospital lab) Nasopharyngeal Nasopharyngeal Swab     Status: None   Collection Time: 01/28/20  9:04 AM   Specimen: Nasopharyngeal Swab  Result Value Ref Range Status   SARS Coronavirus 2 NEGATIVE NEGATIVE Final    Comment: (NOTE) SARS-CoV-2 target nucleic acids are NOT DETECTED.  The SARS-CoV-2 RNA is generally detectable in upper and lower respiratory specimens during the acute phase of infection. The lowest concentration of SARS-CoV-2 viral copies this assay can detect is 250 copies / mL. A negative result does not preclude SARS-CoV-2 infection and should not be used as the sole basis for treatment or other patient management decisions.  A negative result may occur with improper specimen collection / handling, submission of specimen other than nasopharyngeal swab, presence of viral mutation(s) within the areas targeted by this assay, and inadequate number of viral copies (<250 copies / mL). A negative result must be combined with  clinical observations, patient history, and epidemiological information.  Fact Sheet for Patients:   StrictlyIdeas.no  Fact Sheet for Healthcare Providers: BankingDealers.co.za  This test is not yet approved or  cleared by the [place] FDA and has been authorized for detection and/or diagnosis of SARS-CoV-2 by FDA under an Emergency Use Authorization (EUA).  This EUA will remain in effect (meaning this test can be used) for the duration of the  COVID-19 declaration under Section 564(b)(1) of the Act, 21 U.S.C. section 360bbb-3(b)(1), unless the authorization is terminated or revoked sooner.  Performed at [facility], [address] [place], [place] [address]   MRSA PCR Screening     Status: None   Collection Time: 01/29/20 10:10 AM   Specimen: Nasopharyngeal  Result Value Ref Range Status   MRSA by PCR NEGATIVE NEGATIVE Final    Comment:        The GeneXpert MRSA Assay (FDA approved for NASAL specimens only), is one component of a comprehensive MRSA colonization surveillance program. It is not intended to diagnose MRSA infection nor to guide or monitor treatment for MRSA infections. Performed at [facility], [address] [place], [place] [address]   CULTURE, BLOOD (ROUTINE X 2) w Reflex to ID Panel     Status: None   Collection Time: 01/29/20  2:14 PM   Specimen: BLOOD  Result Value Ref Range Status   Specimen Description BLOOD BLOOD LEFT HAND  Final   Special Requests   Final    BOTTLES DRAWN AEROBIC AND ANAEROBIC Blood Culture adequate volume   Culture   Final    NO GROWTH 5 DAYS Performed at [facility], [address] [place], [place] [address]    Report Status 02/03/2020 FINAL  Final  CULTURE, BLOOD (ROUTINE X 2) w Reflex to ID Panel     Status: None   Collection Time: 01/29/20  3:07 PM   Specimen: BLOOD  Result Value Ref Range Status   Specimen Description BLOOD BLOOD  LEFT HAND  Final   Special Requests   Final    BOTTLES DRAWN AEROBIC AND ANAEROBIC Blood Culture results may not be optimal due to an excessive volume of blood received in culture bottles   Culture   Final    NO GROWTH 5 DAYS Performed at [facility], [address], [place], [place] [address]    Report Status 02/03/2020 FINAL  Final  Urine Culture     Status: Abnormal   Collection Time: 01/30/20  1:56 PM   Specimen: Urine, Clean Catch  Result Value Ref Range Status   Specimen Description   Final    URINE, CLEAN CATCH Performed at [facility], [address] [place], [place] [address]    Special Requests   Final    NONE Performed at [facility], [address] [place], [place] [address]    Culture >=100,000 COLONIES/mL ENTEROCOCCUS FAECIUM (A)  Final   Report Status 02/01/2020 FINAL  Final   Organism ID, Bacteria ENTEROCOCCUS FAECIUM (A)  Final      Susceptibility   Enterococcus faecium - MIC*    AMPICILLIN >=32 RESISTANT Resistant     NITROFURANTOIN 256 RESISTANT Resistant     VANCOMYCIN <=0.5 SENSITIVE Sensitive     * >=100,000 COLONIES/mL ENTEROCOCCUS FAECIUM     Time coordinating discharge: Over 30 minutes  SIGNED:   Ezekiel Slocumb, DO [facility] 02/05/2020, 9:56 AM   If 7PM-7AM, please contact night-coverage [email or web address]

## 2020-02-05 NOTE — TOC Transition Note (Signed)
Transition of Care [facility]) - CM/SW Discharge Note   Patient Details  Name: DENNARD VEZINA MRN: [id number] Date of Birth: [birth year]  Transition of [facility]) CM/SW Contact:  Shelbie Hutching, RN Phone Number: 02/05/2020, 9:16 AM   Clinical Narrative:    Patient will discharge to residential hospice facility in [place].  [facility] will provide transport.  Family is aware of discharge today.    Final next level of care: [place] Barriers to Discharge: Barriers Resolved   Patient Goals and CMS Choice Patient states their goals for this hospitalization and ongoing recovery are:: Patient wants to be comfortable CMS Medicare.gov Compare Post Acute Care list provided to:: Patient Choice offered to / list presented to : Patient, Spouse  Discharge Placement              Patient chooses bed at:  [facility]) Patient to be transferred to facility by: [facility] Name of family member notified: Stanton Kidney- wife Patient and family notified of of transfer: 02/05/20  Discharge Plan and Services     Post Acute Care Choice: Hospice                               Social Determinants of Health ([place]) Interventions     Readmission Risk Interventions Readmission Risk Prevention Plan 01/15/2020  Transportation Screening Complete  Medication Review [employer]) Complete  PCP or Specialist appointment within 3-5 days of discharge Complete  HRI or [place] Not Applicable  Some recent data might be hidden

## 2020-02-05 NOTE — Progress Notes (Signed)
[facility] hospital liaison note:  Follow up visit made to new referral for [facility] hospice home. Patient seen following a bath, he appeared very uncomfortable. Staff RN Lorriane Shire notified and PRN medication given. Patient's wife and Doristine Bosworth present, emotional support given  Report called to the hospice home. EMS notified for a 1 pm transport. Hospital Care team updated.  Flo Shanks BSN, Rn, [place] [phone]

## 2020-02-05 NOTE — Progress Notes (Signed)
Patient received discharge paperwork for hospice

## 2020-02-05 NOTE — Care Management Important Message (Signed)
Important Message  Patient Details  Name: Bryan Brewer MRN: [id number] Date of Birth: 08/06/1941   Medicare Important Message Given:  No (patient is comfort care and discharging to residential hospice)     Shelbie Hutching, RN 02/05/2020, 10:29 AM

## 2020-02-12 ENCOUNTER — Telehealth: Payer: Self-pay | Admitting: Oncology

## 2020-02-12 NOTE — Telephone Encounter (Signed)
Writer phoned patient's wife on this date to discuss appts and was informed per wife that patient passed prior to 02-07-20. Appts at the [facility] have been cancelled.

## 2020-03-06 DEATH — deceased

## 2020-04-21 ENCOUNTER — Other Ambulatory Visit: Payer: Medicare HMO

## 2020-04-21 ENCOUNTER — Ambulatory Visit: Payer: Medicare HMO | Admitting: Oncology

## 2020-06-22 ENCOUNTER — Ambulatory Visit: Payer: Medicare HMO | Admitting: Internal Medicine

## 2020-08-01 IMAGING — DX PORTABLE CHEST - 1 VIEW
1 series · 1 of 1 positions shown · non-contrast
Comparison: 03/29/2019 and 02/15/2019 and October 29, 2017

CLINICAL DATA: Pulmonary disease.

EXAM:
PORTABLE CHEST 1 VIEW

[chest ap]
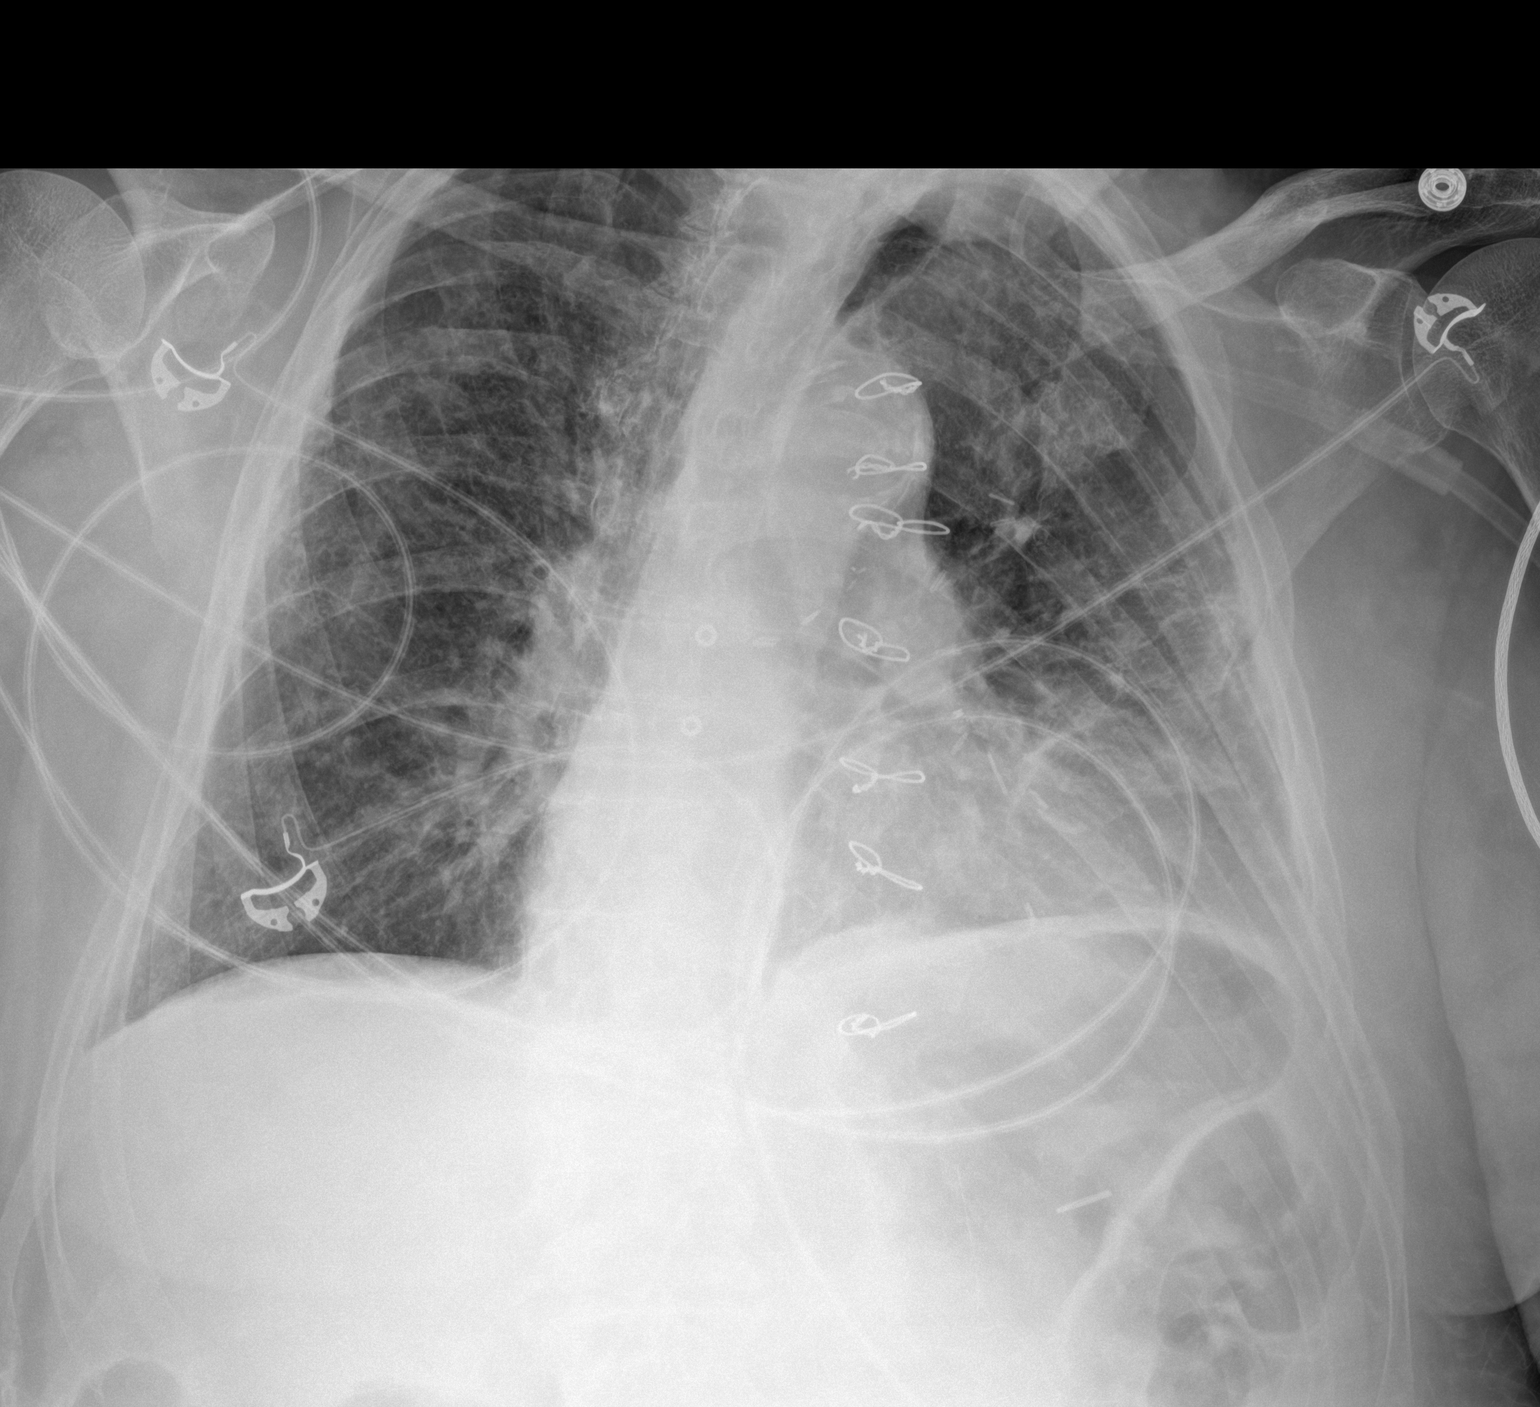

[1 of 1 positions shown; findings below may reference images not displayed]

FINDINGS: The heart size and pulmonary vascularity are normal. CABG. The
accentuation of the interstitial markings has returned to baseline
since the prior study. The patient has bilateral chronic
interstitial disease with no acute abnormalities. No effusions.

No significant bone abnormality.
IMPRESSION: No acute cardiopulmonary disease. Chronic interstitial lung disease.

## 2020-08-01 IMAGING — CT CT CHEST WITHOUT CONTRAST
2 of 3 series · 15 of 36 positions shown, 18 images · non-contrast
Comparison: Prior CT scan of the chest, abdomen and pelvis
10/22/2007

CLINICAL DATA: 77-year-old male pneumonia, advanced COPD, hypoxemic
respiratory failure. Clinical history also includes bladder cancer.

EXAM:
CT CHEST WITHOUT CONTRAST
TECHNIQUE: Multidetector CT imaging of the chest was performed following the
standard protocol without IV contrast.

[Series 2: thorax · axial · 0.81mm/px · z∈[-276,-26]mm · 12 of 147 slices shown, 15 images]
[im 11/147  mediastinal]
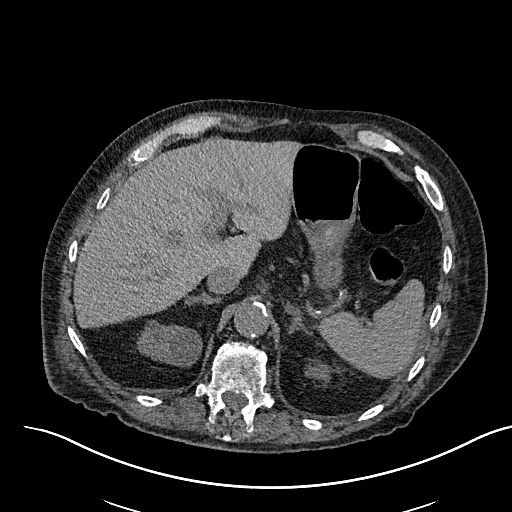
[im 11/147  lung]
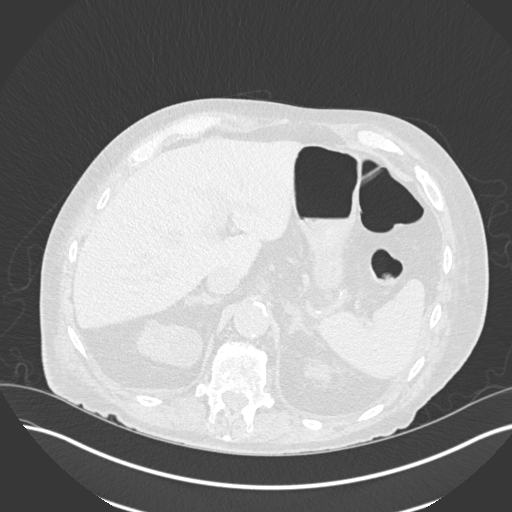
[im 22/147  lung]
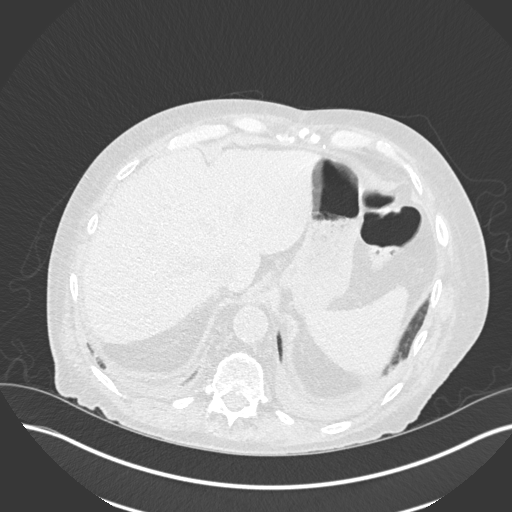
[im 33/147  lung]
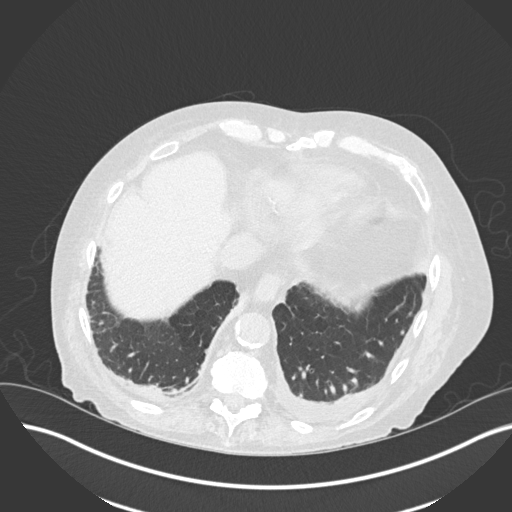
[im 44/147  lung]
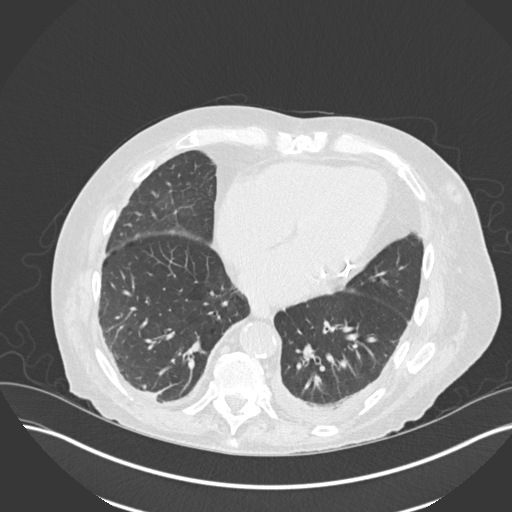
[im 55/147  mediastinal]
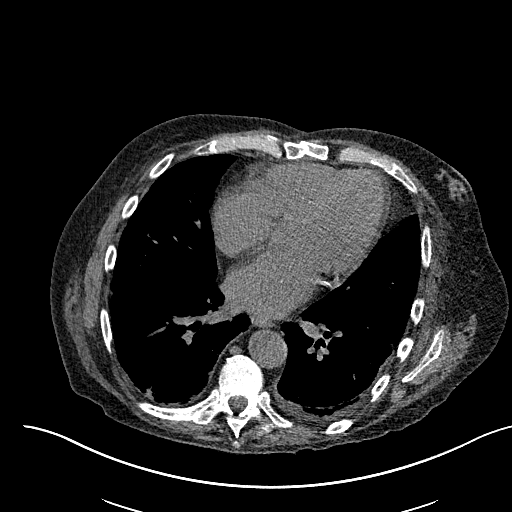
[im 55/147  lung]
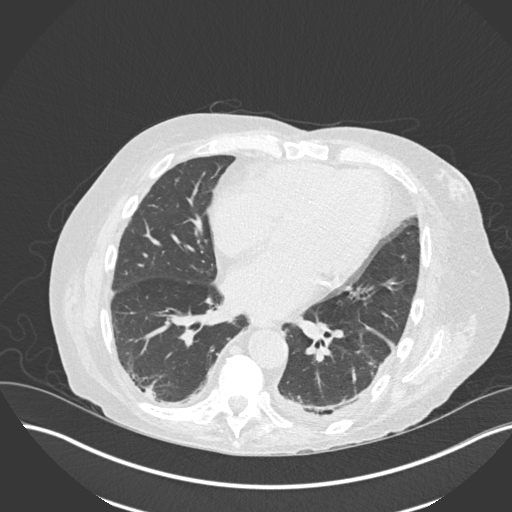
[im 65/147  lung]
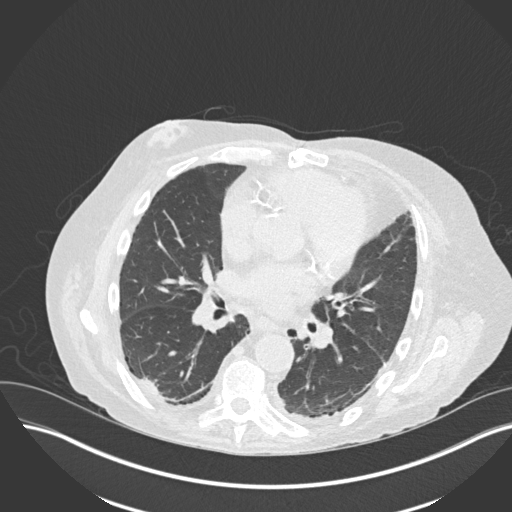
[im 82/147  lung]
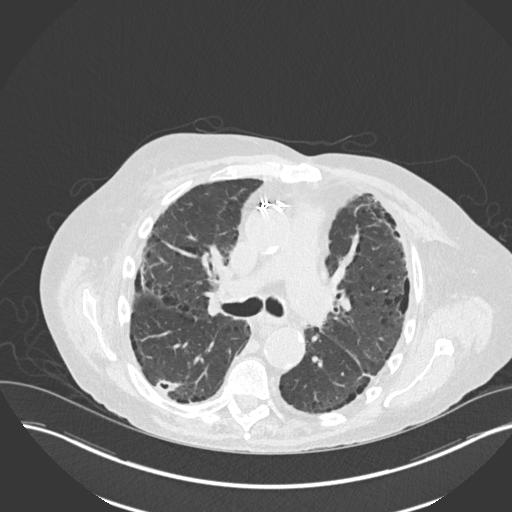
[im 92/147  lung]
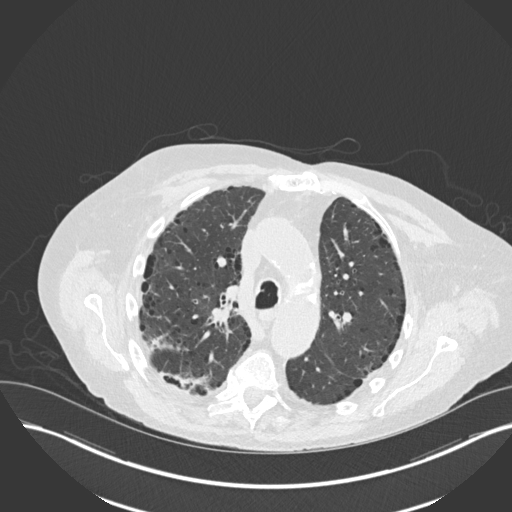
[im 103/147  mediastinal]
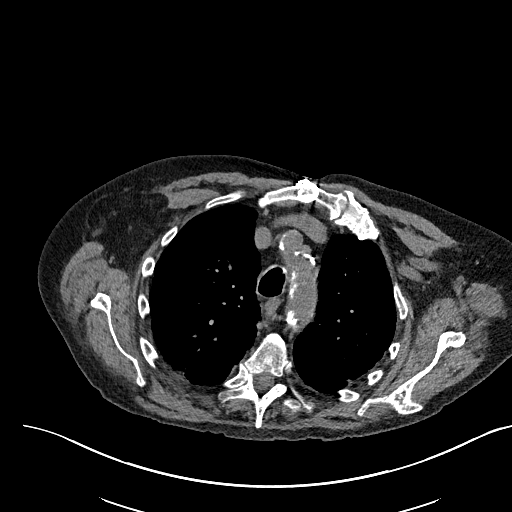
[im 103/147  lung]
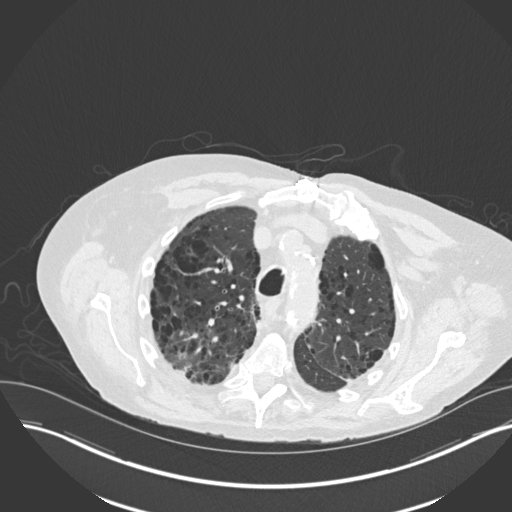
[im 114/147  lung]
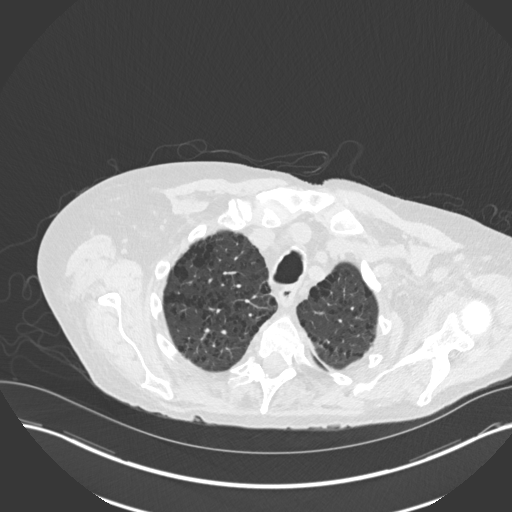
[im 125/147  lung]
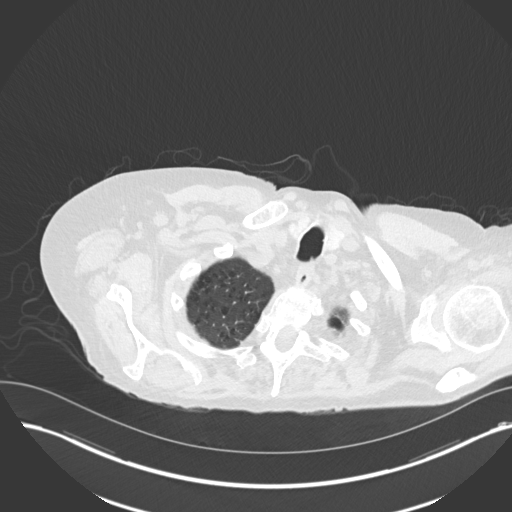
[im 136/147  lung]
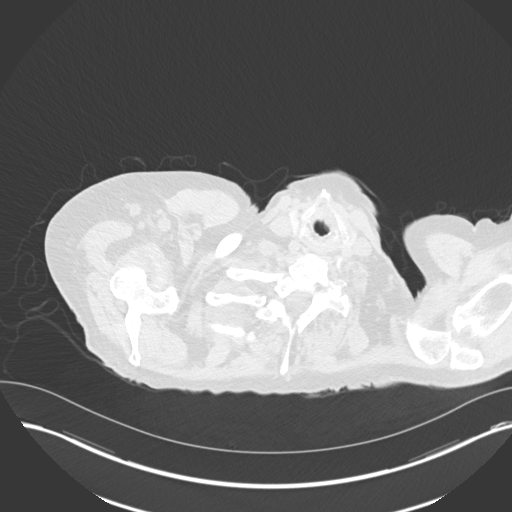

[Series 5: coronal · coronal · 0.68mm/px · 3 of 144 slices shown]
[im 29/144  lung]
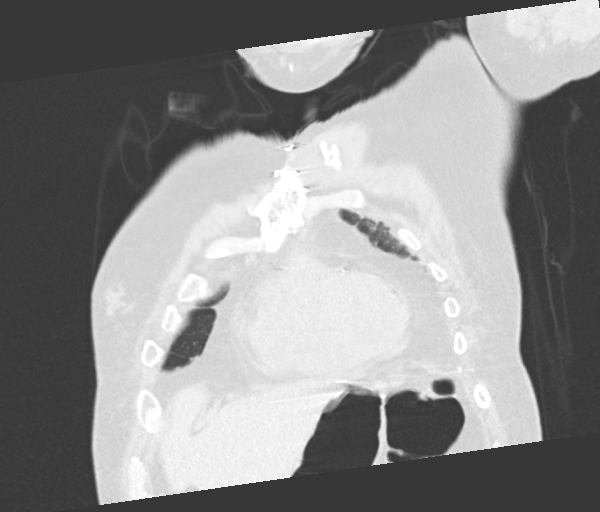
[im 58/144  lung]
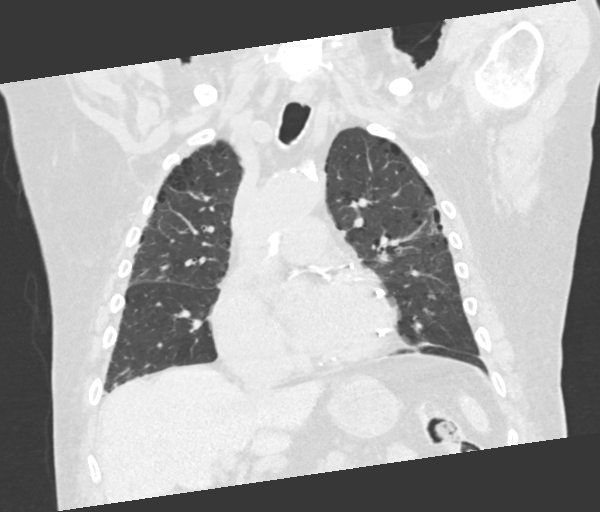
[im 86/144  lung]
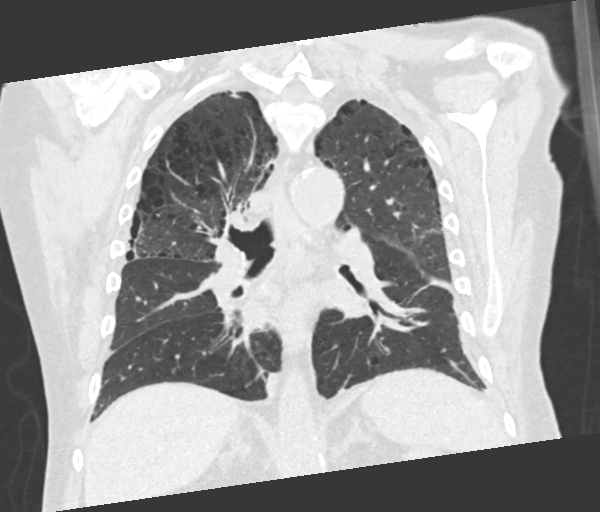

[15 of 36 positions shown; findings below may reference images not displayed]

FINDINGS: Cardiovascular: Limited evaluation in the absence of intravenous
contrast. Atherosclerotic calcifications are present throughout the
aorta. Patient is status post median sternotomy with evidence of
prior multivessel CABG. The heart is at the upper limits of normal
for size. No pericardial effusion. Extensive native coronary artery
disease.

Mediastinum/Nodes: Unremarkable CT appearance of the thyroid gland.
No suspicious mediastinal or hilar adenopathy. No soft tissue
mediastinal mass. The thoracic esophagus is unremarkable.

Lungs/Pleura: Moderately severe combined centrilobular and
paraseptal pulmonary emphysema. Architectural distortion and linear
pleuroparenchymal scarring present in the posterior right upper lobe
suggesting sequelae of a prior infarct or radiation treatment. Mild
chronic atelectasis versus scarring in the lung bases. Diffuse mild
bronchial wall thickening. Small left and trace right pleural
effusions.

Upper Abdomen: Incompletely imaged kidneys demonstrate bilateral
hydronephrosis. Otherwise, no acute abnormality within the upper
abdomen.

Musculoskeletal: No acute fracture or aggressive appearing lytic or
blastic osseous lesion. Stable L1 compression fracture with
approximately 50% height loss. Healed median sternotomy.
IMPRESSION: 1. No acute cardiopulmonary process.
2. Incompletely imaged bilateral hydronephrosis. Recommend clinical
correlation with serum creatinine.
3. Moderately severe combined centrilobular and paraseptal pulmonary
emphysema.
4. Diffuse mild bronchial wall thickening suggests chronic
bronchitis.
5. Areas of scarring and architectural distortion in the posterior
right upper lobe and to a lesser extent in the bilateral lower
lobes.
6. Small left and trace right pleural effusions.
7. Stable chronic L1 compression fracture.

Aortic Atherosclerosis (566KX-HUV.V) and Emphysema (566KX-NUV.E).

## 2020-08-02 IMAGING — US US RENAL
1 series · 14 of 25 positions shown · non-contrast
Comparison: CT abdomen and pelvis 03/25/2012

CLINICAL DATA: Renal failure, type II diabetes mellitus,
hypertension, history bladder cancer

EXAM:
RENAL / URINARY TRACT ULTRASOUND COMPLETE

[Series 1: us renal · 14 of 53 slices shown]
[im 1/53]
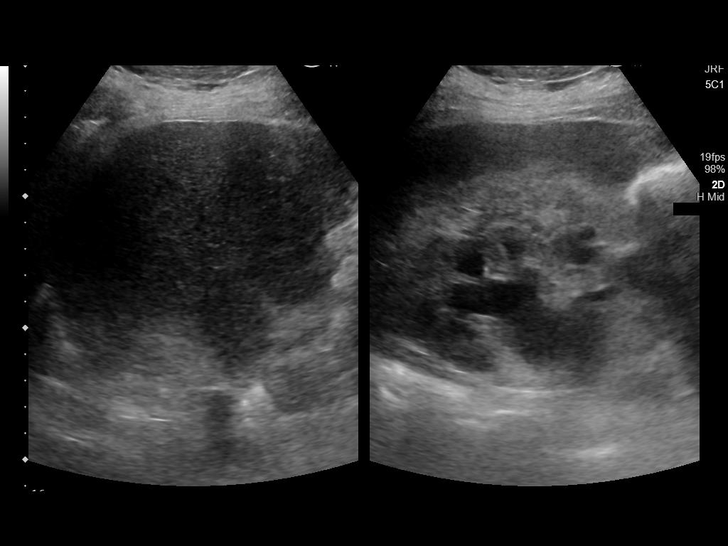
[im 5/53]
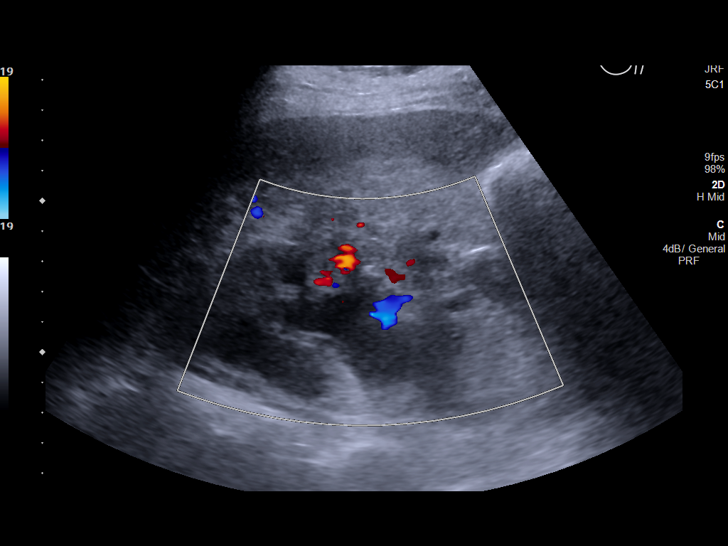
[im 9/53]
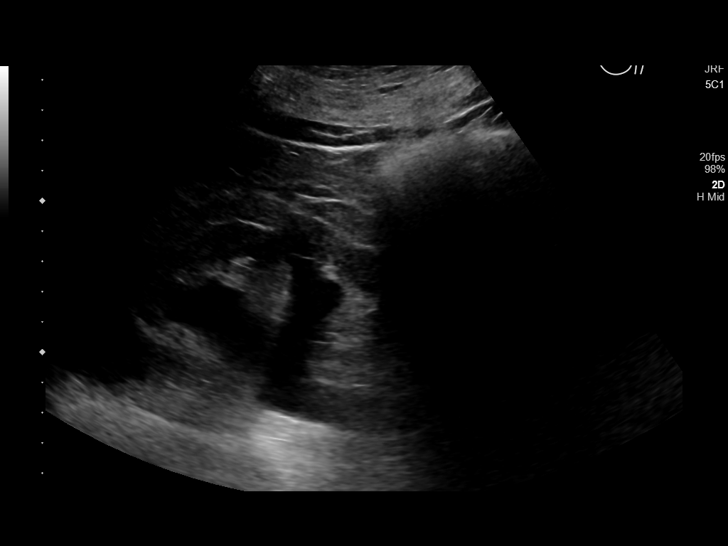
[im 14/53]
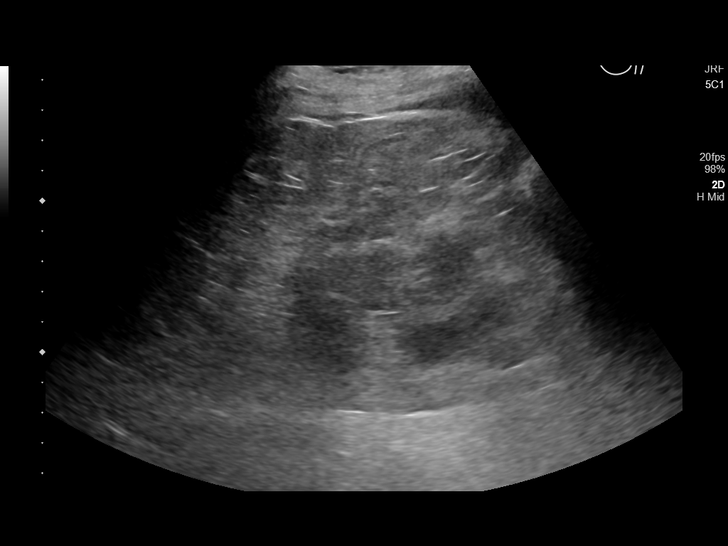
[im 18/53]
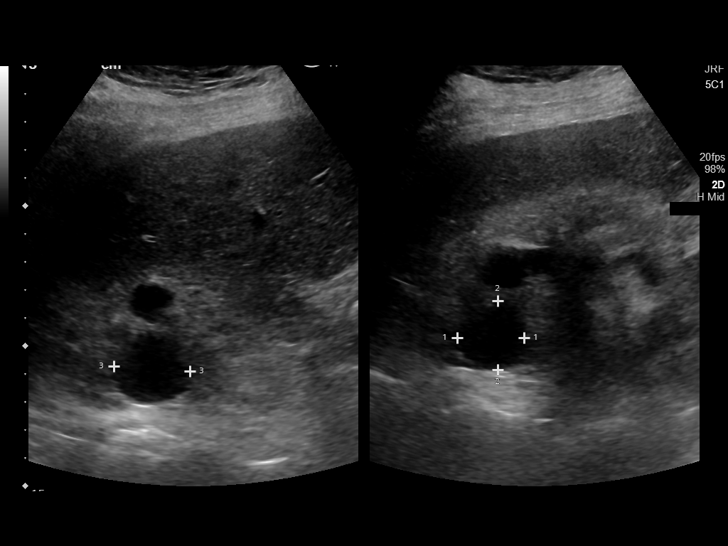
[im 20/53]
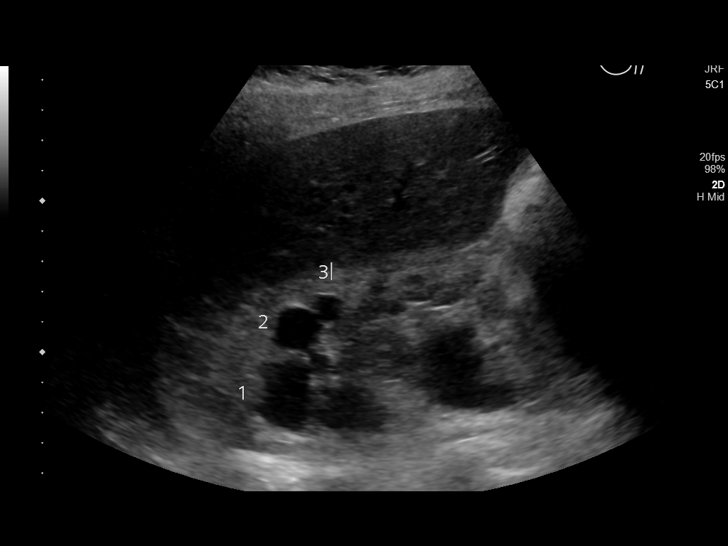
[im 24/53]
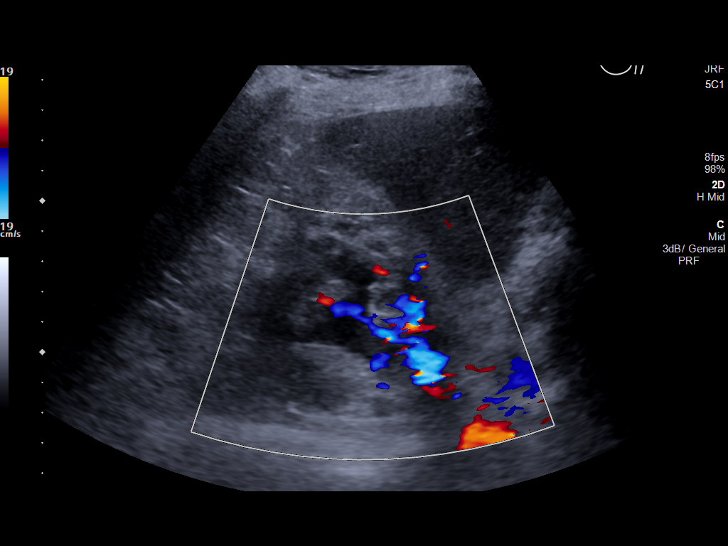
[im 29/53]
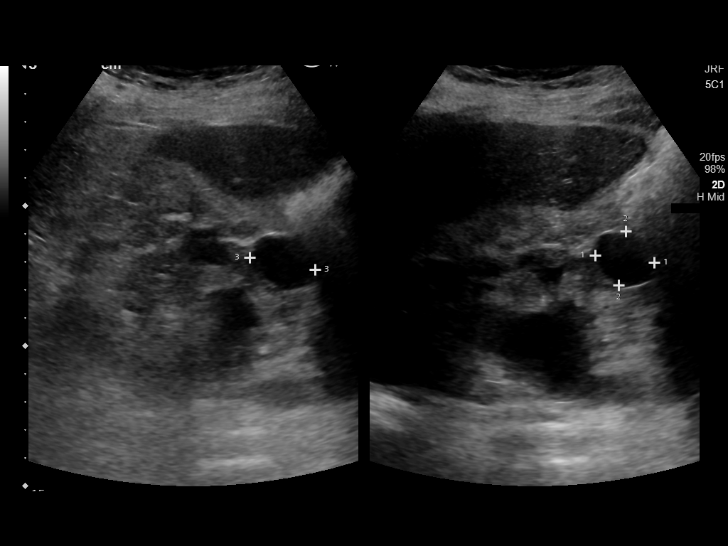
[im 33/53]
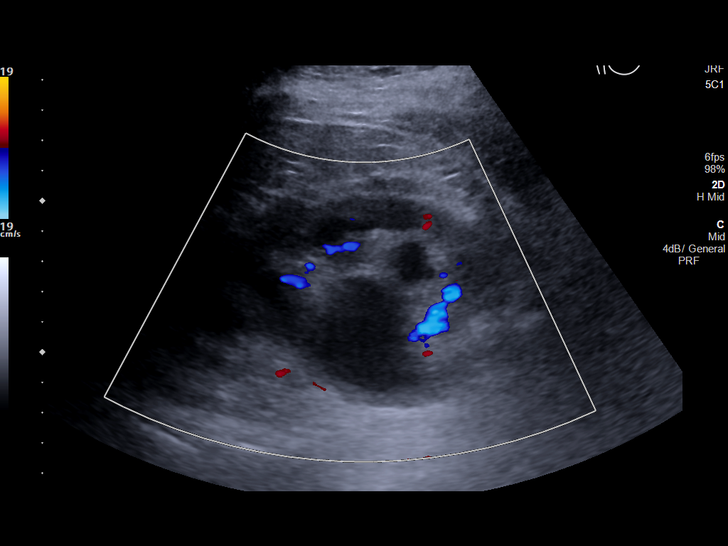
[im 35/53]
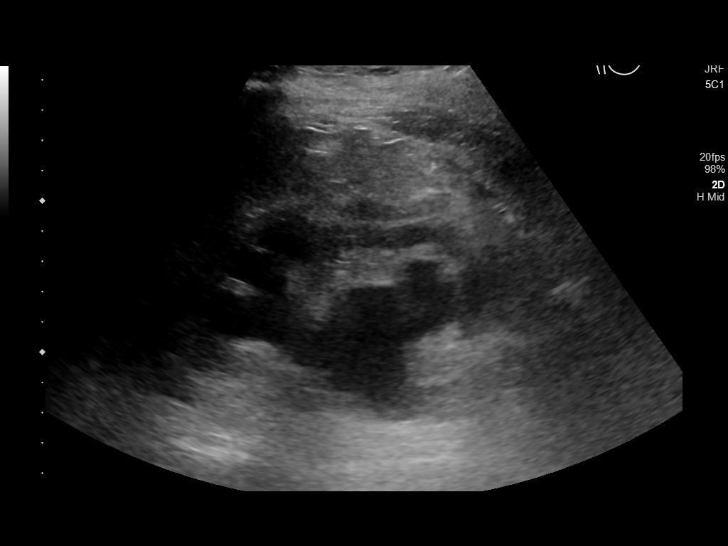
[im 40/53]
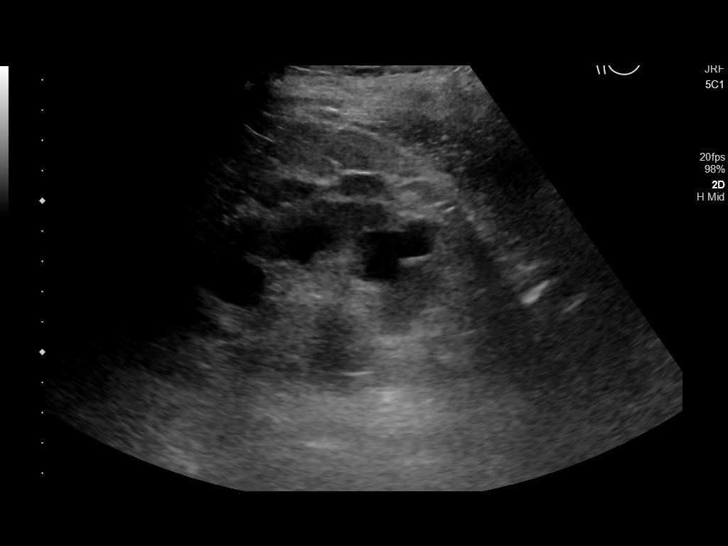
[im 44/53]
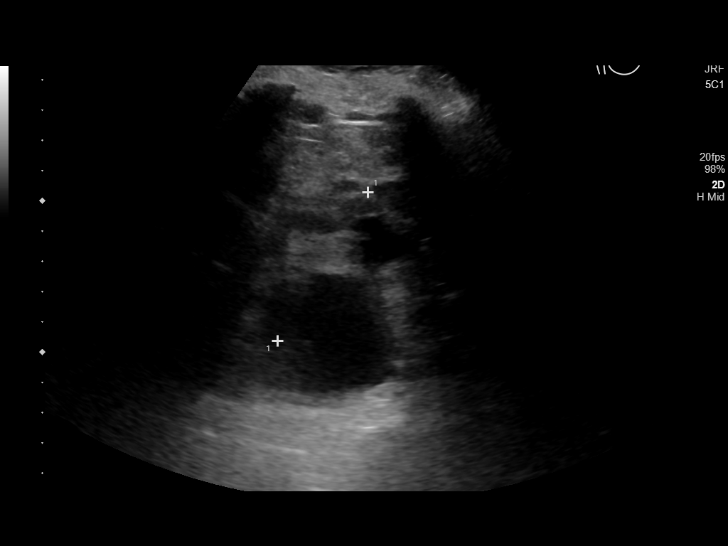
[im 48/53]
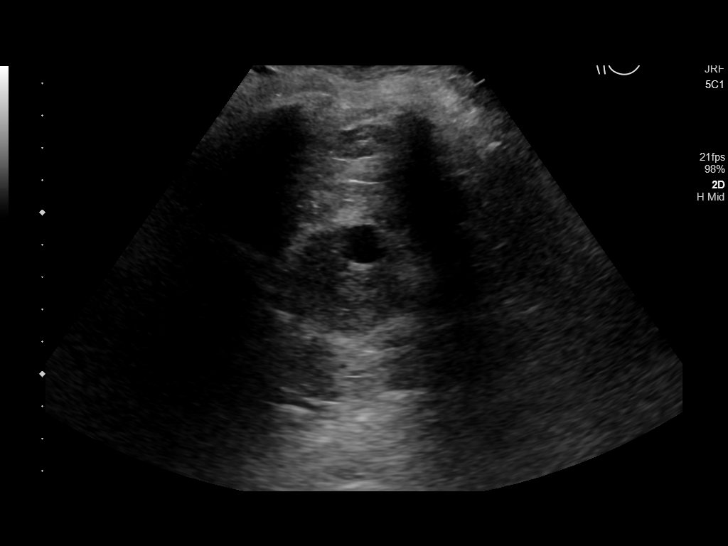
[im 53/53]
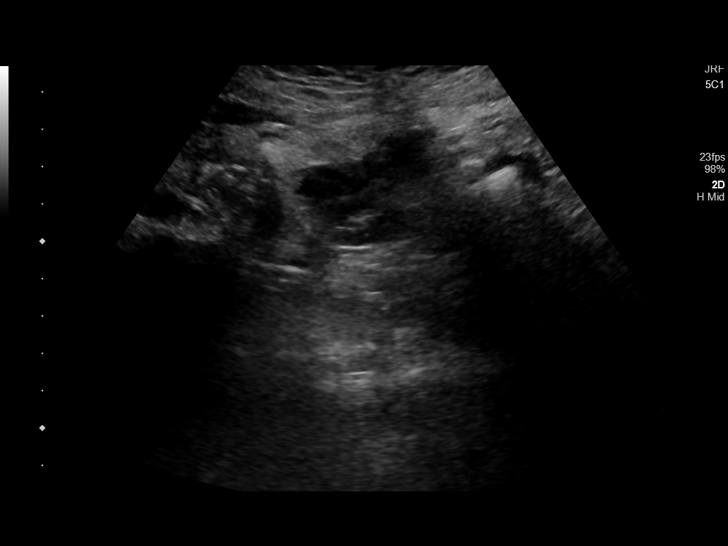

[14 of 25 positions shown; findings below may reference images not displayed]

FINDINGS: Right Kidney:

Renal measurements: 10.6 x 5.4 x 5.3 cm = volume: 157 mL. Cortical
thinning and increased cortical echogenicity. Lobulated renal
contours. Multiple renal cysts identified, most notably 2.4 x 2.5 x
2.7 cm diameter simple cyst and 2.1 x 2.0 x 2.4 cm diameter simple
cyst at the upper pole of the RIGHT kidney. Significant
hydronephrosis. No solid renal mass or shadowing calcification.

Left Kidney:

Renal measurements: 10.8 x 5.0 x 5.8 cm = volume: 164 mL. Cortical
thinning and minimally increased cortical echogenicity. Significant
hydronephrosis. Small cyst at inferior pole 11 x 11 x 12 mm. No
solid mass or shadowing calcification.

Bladder:

Surgically absent, patient has urostomy
IMPRESSION: Significant BILATERAL hydronephrosis, which could be due to
obstruction or reflux in a patient with prior bladder resection and
urostomy.

BILATERAL renal cysts.

## 2020-08-29 IMAGING — DX DG CHEST 1V PORT
1 series · 1 of 1 positions shown · non-contrast
Comparison: 04/20/2019

CLINICAL DATA: Altered mental status

EXAM:
PORTABLE CHEST 1 VIEW

[chest ap]
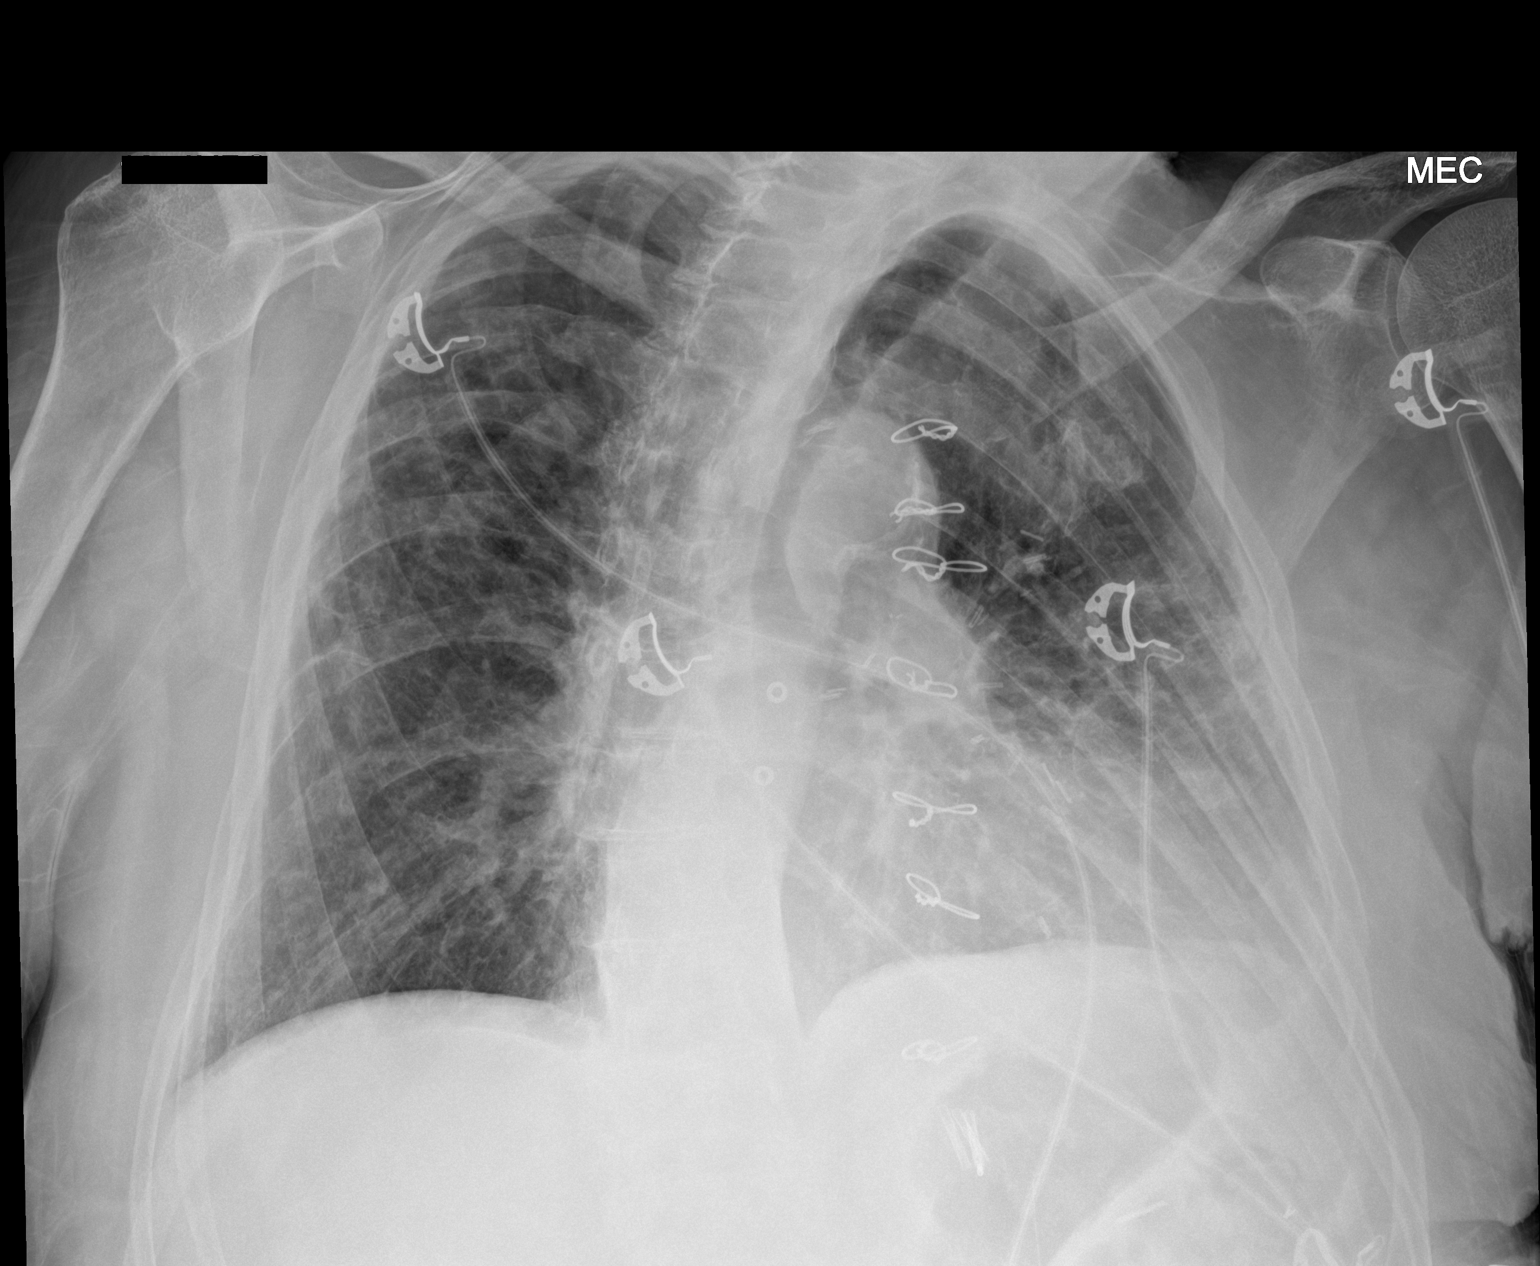

[1 of 1 positions shown; findings below may reference images not displayed]

FINDINGS: There is bilateral chronic interstitial lung disease. There is no
focal consolidation. There is no pleural effusion or pneumothorax.
The heart and mediastinal contours are unremarkable. There is
evidence of prior CABG.

There is no acute osseous abnormality.
IMPRESSION: No active disease.
